# Patient Record
Sex: Female | Born: 1955
Health system: Southern US, Community
[De-identification: ages and names within clinical notes are randomized; demographics above are authoritative.]

## PROBLEM LIST (undated history)

## (undated) DIAGNOSIS — T8859XA Other complications of anesthesia, initial encounter: Secondary | ICD-10-CM

## (undated) DIAGNOSIS — IMO0002 Reserved for concepts with insufficient information to code with codable children: Secondary | ICD-10-CM

## (undated) DIAGNOSIS — G8929 Other chronic pain: Secondary | ICD-10-CM

## (undated) DIAGNOSIS — R9430 Abnormal result of cardiovascular function study, unspecified: Secondary | ICD-10-CM

## (undated) DIAGNOSIS — M1990 Unspecified osteoarthritis, unspecified site: Secondary | ICD-10-CM

## (undated) DIAGNOSIS — J45909 Unspecified asthma, uncomplicated: Secondary | ICD-10-CM

## (undated) DIAGNOSIS — F32A Depression, unspecified: Secondary | ICD-10-CM

## (undated) DIAGNOSIS — M25569 Pain in unspecified knee: Secondary | ICD-10-CM

## (undated) DIAGNOSIS — R000 Tachycardia, unspecified: Secondary | ICD-10-CM

## (undated) DIAGNOSIS — M549 Dorsalgia, unspecified: Secondary | ICD-10-CM

## (undated) DIAGNOSIS — Z9289 Personal history of other medical treatment: Secondary | ICD-10-CM

## (undated) DIAGNOSIS — I10 Essential (primary) hypertension: Secondary | ICD-10-CM

## (undated) DIAGNOSIS — I499 Cardiac arrhythmia, unspecified: Secondary | ICD-10-CM

## (undated) DIAGNOSIS — F329 Major depressive disorder, single episode, unspecified: Secondary | ICD-10-CM

## (undated) HISTORY — PX: REDUCTION MAMMAPLASTY: SUR839

## (undated) HISTORY — PX: ABDOMINAL HYSTERECTOMY: SHX81

## (undated) HISTORY — PX: CHOLECYSTECTOMY: SHX55

## (undated) HISTORY — DX: Reserved for concepts with insufficient information to code with codable children: IMO0002

## (undated) HISTORY — PX: BACK SURGERY: SHX140

## (undated) HISTORY — DX: Abnormal result of cardiovascular function study, unspecified: R94.30

## (undated) HISTORY — DX: Tachycardia, unspecified: R00.0

---

## 2003-08-05 ENCOUNTER — Ambulatory Visit (HOSPITAL_COMMUNITY): Admission: RE | Admit: 2003-08-05 | Discharge: 2003-08-05 | Payer: Self-pay | Admitting: Family Medicine

## 2005-02-05 ENCOUNTER — Ambulatory Visit (HOSPITAL_COMMUNITY): Admission: RE | Admit: 2005-02-05 | Discharge: 2005-02-05 | Payer: Self-pay | Admitting: Family Medicine

## 2005-10-26 ENCOUNTER — Emergency Department (HOSPITAL_COMMUNITY): Admission: EM | Admit: 2005-10-26 | Discharge: 2005-10-26 | Payer: Self-pay | Admitting: Emergency Medicine

## 2006-12-06 ENCOUNTER — Emergency Department (HOSPITAL_COMMUNITY): Admission: EM | Admit: 2006-12-06 | Discharge: 2006-12-06 | Payer: Self-pay | Admitting: Emergency Medicine

## 2006-12-12 ENCOUNTER — Ambulatory Visit: Payer: Self-pay | Admitting: Orthopedic Surgery

## 2006-12-26 ENCOUNTER — Ambulatory Visit: Payer: Self-pay | Admitting: Orthopedic Surgery

## 2007-01-03 ENCOUNTER — Encounter (HOSPITAL_COMMUNITY): Admission: RE | Admit: 2007-01-03 | Discharge: 2007-02-02 | Payer: Self-pay | Admitting: Orthopedic Surgery

## 2007-02-03 ENCOUNTER — Encounter (HOSPITAL_COMMUNITY): Admission: RE | Admit: 2007-02-03 | Discharge: 2007-03-05 | Payer: Self-pay | Admitting: Orthopedic Surgery

## 2007-02-08 ENCOUNTER — Ambulatory Visit: Payer: Self-pay | Admitting: Orthopedic Surgery

## 2007-02-14 DIAGNOSIS — E119 Type 2 diabetes mellitus without complications: Secondary | ICD-10-CM | POA: Insufficient documentation

## 2007-02-14 DIAGNOSIS — Z794 Long term (current) use of insulin: Secondary | ICD-10-CM

## 2007-03-02 ENCOUNTER — Ambulatory Visit: Payer: Self-pay | Admitting: Orthopedic Surgery

## 2007-03-02 DIAGNOSIS — M25519 Pain in unspecified shoulder: Secondary | ICD-10-CM | POA: Insufficient documentation

## 2008-03-22 ENCOUNTER — Ambulatory Visit (HOSPITAL_COMMUNITY): Admission: RE | Admit: 2008-03-22 | Discharge: 2008-03-22 | Payer: Self-pay | Admitting: Family Medicine

## 2008-06-06 ENCOUNTER — Ambulatory Visit (HOSPITAL_COMMUNITY): Admission: RE | Admit: 2008-06-06 | Discharge: 2008-06-06 | Payer: Self-pay | Admitting: Family Medicine

## 2008-07-18 ENCOUNTER — Encounter: Admission: RE | Admit: 2008-07-18 | Discharge: 2008-07-18 | Payer: Self-pay | Admitting: Neurosurgery

## 2009-03-06 ENCOUNTER — Encounter: Admission: RE | Admit: 2009-03-06 | Discharge: 2009-03-06 | Payer: Self-pay | Admitting: Neurosurgery

## 2009-03-14 ENCOUNTER — Ambulatory Visit (HOSPITAL_COMMUNITY): Admission: RE | Admit: 2009-03-14 | Discharge: 2009-03-14 | Payer: Self-pay | Admitting: Internal Medicine

## 2009-03-24 ENCOUNTER — Ambulatory Visit (HOSPITAL_COMMUNITY): Admission: RE | Admit: 2009-03-24 | Discharge: 2009-03-24 | Payer: Self-pay | Admitting: Family Medicine

## 2009-04-14 ENCOUNTER — Inpatient Hospital Stay (HOSPITAL_COMMUNITY): Admission: RE | Admit: 2009-04-14 | Discharge: 2009-04-15 | Payer: Self-pay | Admitting: Neurosurgery

## 2009-07-23 ENCOUNTER — Ambulatory Visit (HOSPITAL_COMMUNITY): Admission: RE | Admit: 2009-07-23 | Discharge: 2009-07-23 | Payer: Self-pay | Admitting: Family Medicine

## 2010-01-26 ENCOUNTER — Observation Stay (HOSPITAL_COMMUNITY): Admission: EM | Admit: 2010-01-26 | Discharge: 2010-01-27 | Payer: Self-pay | Admitting: Emergency Medicine

## 2010-01-26 DIAGNOSIS — Z9289 Personal history of other medical treatment: Secondary | ICD-10-CM

## 2010-01-26 HISTORY — DX: Personal history of other medical treatment: Z92.89

## 2010-05-26 ENCOUNTER — Ambulatory Visit (HOSPITAL_COMMUNITY)
Admission: RE | Admit: 2010-05-26 | Discharge: 2010-05-26 | Payer: Self-pay | Source: Home / Self Care | Admitting: Psychiatry

## 2010-05-28 ENCOUNTER — Other Ambulatory Visit (HOSPITAL_COMMUNITY)
Admission: RE | Admit: 2010-05-28 | Discharge: 2010-06-18 | Payer: Self-pay | Source: Home / Self Care | Attending: Psychiatry | Admitting: Psychiatry

## 2010-06-03 ENCOUNTER — Ambulatory Visit (HOSPITAL_COMMUNITY): Payer: Self-pay | Admitting: Psychiatry

## 2010-07-19 ENCOUNTER — Encounter: Payer: Self-pay | Admitting: Family Medicine

## 2010-07-20 ENCOUNTER — Encounter: Payer: Self-pay | Admitting: Neurosurgery

## 2010-07-28 NOTE — Assessment & Plan Note (Signed)
    Chief Complaint:  shoulder pain.  History of Present Illness: I saw Kelli Summers in the office today for a followup visit.  She is a 55 years old woman with the complaint of:  left shoulder fracture s/p PT        The patient comes in today for the initial evaluation of shoulder pain.  The patient presents with left shoulder pain.  The patient states they are able to  hold comfortably at side, sleep comfortably,  reach up fine       Prior evaluation and treatment has included X-rays:.  Prior effective treatment includes: NSAID's:, shoulder exercises, and PT.     Prior Medications :  * BENICAR  * BUDEPRION  * ACTOPLUS  * LEXAPRO  * POTASSIUM  * FISH OIL  * PRIMROSE     Current Allergies (reviewed today): ! CIPRO ! PCN ! VICODIN  Past Medical History:    Reviewed history from 02/14/2007 and no changes required:       Diabetes       High Blood Pressure   Family History:    Reviewed history and no changes required:  Social History:    Reviewed history and no changes required:   Risk Factors:     Shoulder/Elbow Exam  General:    Well-developed, well-nourished, in no acute distress; alert and oriented x 3.    Skin:    Intact with no erythema; no scarring.    Inspection:    Inspection is normal.    full forward elevation   Palpation:    Non-tender to palpation bilaterally.    Vascular:    normal     Problems:  Medical Problems Added: 1)  Dx of Shoulder Pain  (ICD-719.41)   Impression & Recommendations:  Problem # 1:  SHOULDER PAIN (ICD-719.41) proximal humerus fracture    Patient Instructions: 1)  d/c 2)  return to work on 9/15 3)  Medically cleared to return to work; return to work note provided.

## 2010-09-08 LAB — HEPATIC FUNCTION PANEL
ALT: 34 U/L (ref 0–35)
AST: 30 U/L (ref 0–37)
Albumin: 3.6 g/dL (ref 3.5–5.2)
Alkaline Phosphatase: 127 U/L — ABNORMAL HIGH (ref 39–117)
Bilirubin, Direct: 0.1 mg/dL (ref 0.0–0.3)
Indirect Bilirubin: 0.2 mg/dL — ABNORMAL LOW (ref 0.3–0.9)
Total Bilirubin: 0.3 mg/dL (ref 0.3–1.2)
Total Protein: 7.1 g/dL (ref 6.0–8.3)

## 2010-09-08 LAB — AMYLASE: Amylase: 20 U/L (ref 0–105)

## 2010-09-08 LAB — LIPASE, BLOOD: Lipase: 26 U/L (ref 11–59)

## 2010-09-11 LAB — GLUCOSE, CAPILLARY
Glucose-Capillary: 124 mg/dL — ABNORMAL HIGH (ref 70–99)
Glucose-Capillary: 138 mg/dL — ABNORMAL HIGH (ref 70–99)
Glucose-Capillary: 151 mg/dL — ABNORMAL HIGH (ref 70–99)
Glucose-Capillary: 174 mg/dL — ABNORMAL HIGH (ref 70–99)
Glucose-Capillary: 214 mg/dL — ABNORMAL HIGH (ref 70–99)
Glucose-Capillary: 238 mg/dL — ABNORMAL HIGH (ref 70–99)
Glucose-Capillary: 57 mg/dL — ABNORMAL LOW (ref 70–99)

## 2010-09-11 LAB — COMPREHENSIVE METABOLIC PANEL
ALT: 40 U/L — ABNORMAL HIGH (ref 0–35)
ALT: 73 U/L — ABNORMAL HIGH (ref 0–35)
AST: 43 U/L — ABNORMAL HIGH (ref 0–37)
AST: 58 U/L — ABNORMAL HIGH (ref 0–37)
Albumin: 3.1 g/dL — ABNORMAL LOW (ref 3.5–5.2)
Albumin: 3.4 g/dL — ABNORMAL LOW (ref 3.5–5.2)
Alkaline Phosphatase: 100 U/L (ref 39–117)
Alkaline Phosphatase: 82 U/L (ref 39–117)
BUN: 21 mg/dL (ref 6–23)
BUN: 27 mg/dL — ABNORMAL HIGH (ref 6–23)
CO2: 21 mEq/L (ref 19–32)
CO2: 26 mEq/L (ref 19–32)
Calcium: 8.9 mg/dL (ref 8.4–10.5)
Calcium: 9.1 mg/dL (ref 8.4–10.5)
Chloride: 106 mEq/L (ref 96–112)
Chloride: 107 mEq/L (ref 96–112)
Creatinine, Ser: 1.1 mg/dL (ref 0.4–1.2)
Creatinine, Ser: 1.11 mg/dL (ref 0.4–1.2)
GFR calc Af Amer: >60 mL/min (ref >60)
GFR calc Af Amer: >60 mL/min (ref >60)
GFR calc non Af Amer: 51 mL/min — ABNORMAL LOW (ref >60)
GFR calc non Af Amer: 52 mL/min — ABNORMAL LOW (ref >60)
Glucose, Bld: 123 mg/dL — ABNORMAL HIGH (ref 70–99)
Glucose, Bld: 155 mg/dL — ABNORMAL HIGH (ref 70–99)
Potassium: 3.8 mEq/L (ref 3.5–5.1)
Potassium: 4.8 mEq/L (ref 3.5–5.1)
Sodium: 135 mEq/L (ref 135–145)
Sodium: 137 mEq/L (ref 135–145)
Total Bilirubin: 0.3 mg/dL (ref 0.3–1.2)
Total Bilirubin: 0.5 mg/dL (ref 0.3–1.2)
Total Protein: 6 g/dL (ref 6.0–8.3)
Total Protein: 6.4 g/dL (ref 6.0–8.3)

## 2010-09-11 LAB — CK TOTAL AND CKMB (NOT AT ARMC)
CK, MB: 1.1 ng/mL (ref 0.3–4.0)
CK, MB: 1.2 ng/mL (ref 0.3–4.0)
CK, MB: 1.3 ng/mL (ref 0.3–4.0)
Relative Index: INVALID (ref 0.0–2.5)
Relative Index: INVALID (ref 0.0–2.5)
Relative Index: INVALID (ref 0.0–2.5)
Total CK: 49 U/L (ref 7–177)
Total CK: 55 U/L (ref 7–177)
Total CK: 56 U/L (ref 7–177)

## 2010-09-11 LAB — T3: T3, Total: 96.2 ng/dl (ref 80.0–204.0)

## 2010-09-11 LAB — HEMOGLOBIN A1C
Hgb A1c MFr Bld: 8.6 % — ABNORMAL HIGH (ref <5.7)
Mean Plasma Glucose: 200 mg/dL — ABNORMAL HIGH (ref <117)

## 2010-09-11 LAB — CBC
HCT: 36.9 % (ref 36.0–46.0)
Hemoglobin: 11.8 g/dL — ABNORMAL LOW (ref 12.0–15.0)
MCH: 29.4 pg (ref 26.0–34.0)
MCHC: 32 g/dL (ref 30.0–36.0)
MCV: 91.8 fL (ref 78.0–100.0)
Platelets: 174 10*3/uL (ref 150–400)
RBC: 4.02 MIL/uL (ref 3.87–5.11)
RDW: 13.6 % (ref 11.5–15.5)
WBC: 8 10*3/uL (ref 4.0–10.5)

## 2010-09-11 LAB — HEPATITIS PANEL, ACUTE
HCV Ab: NEGATIVE
Hep A IgM: NEGATIVE
Hep B C IgM: NEGATIVE
Hepatitis B Surface Ag: NEGATIVE

## 2010-09-11 LAB — MRSA PCR SCREENING: MRSA by PCR: NEGATIVE

## 2010-09-11 LAB — LIPID PANEL
Cholesterol: 130 mg/dL (ref 0–200)
HDL: 38 mg/dL — ABNORMAL LOW (ref >39)
LDL Cholesterol: 53 mg/dL (ref 0–99)
Total CHOL/HDL Ratio: 3.4 RATIO
Triglycerides: 194 mg/dL — ABNORMAL HIGH (ref <150)
VLDL: 39 mg/dL (ref 0–40)

## 2010-09-11 LAB — TROPONIN I
Troponin I: 0.01 ng/mL (ref 0.00–0.06)
Troponin I: 0.01 ng/mL (ref 0.00–0.06)
Troponin I: 0.02 ng/mL (ref 0.00–0.06)

## 2010-09-11 LAB — H. PYLORI ANTIBODY, IGG: H Pylori IgG: <0.4 {ISR}

## 2010-09-11 LAB — T4, FREE: Free T4: 1.33 ng/dL (ref 0.80–1.80)

## 2010-09-11 LAB — TSH: TSH: 5.185 u[IU]/mL — ABNORMAL HIGH (ref 0.350–4.500)

## 2010-09-11 LAB — SEDIMENTATION RATE: Sed Rate: 14 mm/hr (ref 0–22)

## 2010-09-12 LAB — CBC
HCT: 38.1 % (ref 36.0–46.0)
Hemoglobin: 12.9 g/dL (ref 12.0–15.0)
MCH: 30.6 pg (ref 26.0–34.0)
MCHC: 33.9 g/dL (ref 30.0–36.0)
MCV: 90.3 fL (ref 78.0–100.0)
Platelets: 174 10*3/uL (ref 150–400)
RBC: 4.22 MIL/uL (ref 3.87–5.11)
RDW: 13.5 % (ref 11.5–15.5)
WBC: 9.2 10*3/uL (ref 4.0–10.5)

## 2010-09-12 LAB — POCT CARDIAC MARKERS
CKMB, poc: 1 ng/mL (ref 1.0–8.0)
CKMB, poc: 1.2 ng/mL (ref 1.0–8.0)
Myoglobin, poc: 78.1 ng/mL (ref 12–200)
Myoglobin, poc: 79.2 ng/mL (ref 12–200)
Troponin i, poc: <0.05 ng/mL (ref 0.00–0.09)
Troponin i, poc: <0.05 ng/mL (ref 0.00–0.09)

## 2010-09-12 LAB — DIFFERENTIAL
Basophils Absolute: 0.1 10*3/uL (ref 0.0–0.1)
Basophils Relative: 1 % (ref 0–1)
Eosinophils Absolute: 0.4 10*3/uL (ref 0.0–0.7)
Eosinophils Relative: 4 % (ref 0–5)
Lymphocytes Relative: 41 % (ref 12–46)
Lymphs Abs: 3.8 10*3/uL (ref 0.7–4.0)
Monocytes Absolute: 0.5 10*3/uL (ref 0.1–1.0)
Monocytes Relative: 6 % (ref 3–12)
Neutro Abs: 4.5 10*3/uL (ref 1.7–7.7)
Neutrophils Relative %: 49 % (ref 43–77)

## 2010-09-12 LAB — POCT I-STAT, CHEM 8
BUN: 31 mg/dL — ABNORMAL HIGH (ref 6–23)
Calcium, Ion: 1.24 mmol/L (ref 1.12–1.32)
Chloride: 108 mEq/L (ref 96–112)
Creatinine, Ser: 1.2 mg/dL (ref 0.4–1.2)
Glucose, Bld: 162 mg/dL — ABNORMAL HIGH (ref 70–99)
HCT: 40 % (ref 36.0–46.0)
Hemoglobin: 13.6 g/dL (ref 12.0–15.0)
Potassium: 4.4 mEq/L (ref 3.5–5.1)
Sodium: 138 mEq/L (ref 135–145)
TCO2: 20 mmol/L (ref 0–100)

## 2010-10-01 LAB — CBC
HCT: 38.8 % (ref 36.0–46.0)
Hemoglobin: 13.2 g/dL (ref 12.0–15.0)
MCHC: 34.1 g/dL (ref 30.0–36.0)
MCV: 90.3 fL (ref 78.0–100.0)
Platelets: 207 10*3/uL (ref 150–400)
RBC: 4.29 MIL/uL (ref 3.87–5.11)
RDW: 13.7 % (ref 11.5–15.5)
WBC: 10.2 10*3/uL (ref 4.0–10.5)

## 2010-10-01 LAB — GLUCOSE, CAPILLARY
Glucose-Capillary: 166 mg/dL — ABNORMAL HIGH (ref 70–99)
Glucose-Capillary: 191 mg/dL — ABNORMAL HIGH (ref 70–99)
Glucose-Capillary: 222 mg/dL — ABNORMAL HIGH (ref 70–99)
Glucose-Capillary: 227 mg/dL — ABNORMAL HIGH (ref 70–99)
Glucose-Capillary: 285 mg/dL — ABNORMAL HIGH (ref 70–99)

## 2010-10-01 LAB — BASIC METABOLIC PANEL
BUN: 8 mg/dL (ref 6–23)
CO2: 27 mEq/L (ref 19–32)
Calcium: 9.1 mg/dL (ref 8.4–10.5)
Chloride: 102 mEq/L (ref 96–112)
Creatinine, Ser: 0.87 mg/dL (ref 0.4–1.2)
GFR calc Af Amer: >60 mL/min (ref >60)
GFR calc non Af Amer: >60 mL/min (ref >60)
Glucose, Bld: 289 mg/dL — ABNORMAL HIGH (ref 70–99)
Potassium: 4.4 mEq/L (ref 3.5–5.1)
Sodium: 138 mEq/L (ref 135–145)

## 2011-06-11 ENCOUNTER — Telehealth: Payer: Self-pay

## 2011-06-11 NOTE — Telephone Encounter (Signed)
Called, line busy. Mailing a letter to call.

## 2011-07-10 ENCOUNTER — Encounter (HOSPITAL_COMMUNITY): Payer: Self-pay | Admitting: *Deleted

## 2011-07-10 ENCOUNTER — Emergency Department (HOSPITAL_COMMUNITY)
Admission: EM | Admit: 2011-07-10 | Discharge: 2011-07-10 | Disposition: A | Discharge status: Home / Self Care | Payer: 59 | Attending: Emergency Medicine | Admitting: Emergency Medicine

## 2011-07-10 DIAGNOSIS — N390 Urinary tract infection, site not specified: Secondary | ICD-10-CM | POA: Insufficient documentation

## 2011-07-10 DIAGNOSIS — M545 Low back pain, unspecified: Secondary | ICD-10-CM | POA: Insufficient documentation

## 2011-07-10 DIAGNOSIS — R739 Hyperglycemia, unspecified: Secondary | ICD-10-CM

## 2011-07-10 DIAGNOSIS — R6883 Chills (without fever): Secondary | ICD-10-CM | POA: Insufficient documentation

## 2011-07-10 DIAGNOSIS — Z79899 Other long term (current) drug therapy: Secondary | ICD-10-CM | POA: Insufficient documentation

## 2011-07-10 DIAGNOSIS — E119 Type 2 diabetes mellitus without complications: Secondary | ICD-10-CM | POA: Insufficient documentation

## 2011-07-10 DIAGNOSIS — R5381 Other malaise: Secondary | ICD-10-CM | POA: Insufficient documentation

## 2011-07-10 LAB — COMPREHENSIVE METABOLIC PANEL
ALT: 23 U/L (ref 0–35)
AST: 13 U/L (ref 0–37)
Albumin: 3.8 g/dL (ref 3.5–5.2)
Alkaline Phosphatase: 158 U/L — ABNORMAL HIGH (ref 39–117)
BUN: 24 mg/dL — ABNORMAL HIGH (ref 6–23)
CO2: 24 mEq/L (ref 19–32)
Calcium: 10.7 mg/dL — ABNORMAL HIGH (ref 8.4–10.5)
Chloride: 100 mEq/L (ref 96–112)
Creatinine, Ser: 0.94 mg/dL (ref 0.50–1.10)
GFR calc Af Amer: 78 mL/min — ABNORMAL LOW (ref >90)
GFR calc non Af Amer: 67 mL/min — ABNORMAL LOW (ref >90)
Glucose, Bld: 482 mg/dL — ABNORMAL HIGH (ref 70–99)
Potassium: 4.6 mEq/L (ref 3.5–5.1)
Sodium: 134 mEq/L — ABNORMAL LOW (ref 135–145)
Total Bilirubin: 0.5 mg/dL (ref 0.3–1.2)
Total Protein: 7.2 g/dL (ref 6.0–8.3)

## 2011-07-10 LAB — CBC
HCT: 39.9 % (ref 36.0–46.0)
Hemoglobin: 13.4 g/dL (ref 12.0–15.0)
MCH: 30.4 pg (ref 26.0–34.0)
MCHC: 33.6 g/dL (ref 30.0–36.0)
MCV: 90.5 fL (ref 78.0–100.0)
Platelets: 184 10*3/uL (ref 150–400)
RBC: 4.41 MIL/uL (ref 3.87–5.11)
RDW: 13.2 % (ref 11.5–15.5)
WBC: 10.8 10*3/uL — ABNORMAL HIGH (ref 4.0–10.5)

## 2011-07-10 LAB — GLUCOSE, CAPILLARY
Glucose-Capillary: 485 mg/dL — ABNORMAL HIGH (ref 70–99)
Glucose-Capillary: 357 mg/dL — ABNORMAL HIGH (ref 70–99)

## 2011-07-10 LAB — URINALYSIS, ROUTINE W REFLEX MICROSCOPIC
Bilirubin Urine: NEGATIVE
Glucose, UA: >1000 mg/dL — AB
Hgb urine dipstick: NEGATIVE
Ketones, ur: NEGATIVE mg/dL
Leukocytes, UA: NEGATIVE
Nitrite: NEGATIVE
Protein, ur: NEGATIVE mg/dL
Specific Gravity, Urine: 1.01 (ref 1.005–1.030)
Urobilinogen, UA: 0.2 mg/dL (ref 0.0–1.0)
pH: 5.5 (ref 5.0–8.0)

## 2011-07-10 LAB — URINE MICROSCOPIC-ADD ON

## 2011-07-10 LAB — DIFFERENTIAL
Basophils Absolute: 0 10*3/uL (ref 0.0–0.1)
Basophils Relative: 0 % (ref 0–1)
Eosinophils Absolute: 0.3 10*3/uL (ref 0.0–0.7)
Eosinophils Relative: 3 % (ref 0–5)
Lymphocytes Relative: 40 % (ref 12–46)
Lymphs Abs: 4.3 10*3/uL — ABNORMAL HIGH (ref 0.7–4.0)
Monocytes Absolute: 0.5 10*3/uL (ref 0.1–1.0)
Monocytes Relative: 5 % (ref 3–12)
Neutro Abs: 5.6 10*3/uL (ref 1.7–7.7)
Neutrophils Relative %: 52 % (ref 43–77)

## 2011-07-10 MED ORDER — SULFAMETHOXAZOLE-TRIMETHOPRIM 800-160 MG PO TABS: 1.0000 | ORAL_TABLET | Freq: Two times a day (BID) | ORAL | Status: AC

## 2011-07-10 MED ORDER — SODIUM CHLORIDE 0.9 % IV SOLN
Freq: Once | INTRAVENOUS | Status: AC
  Administered 2011-07-10: 06:00:00 via INTRAVENOUS

## 2011-07-10 MED ORDER — INSULIN ASPART 100 UNIT/ML ~~LOC~~ SOLN
8.0000 [IU] | Freq: Once | SUBCUTANEOUS | Status: AC
  Administered 2011-07-10: 8 [IU] via INTRAVENOUS
  Filled 2011-07-10: qty 3

## 2011-07-10 MED ORDER — OXYCODONE-ACETAMINOPHEN 5-325 MG PO TABS: 1.0000 | ORAL_TABLET | Freq: Four times a day (QID) | ORAL | Status: AC | PRN

## 2011-07-10 MED ORDER — KETOROLAC TROMETHAMINE 30 MG/ML IJ SOLN
30.0000 mg | Freq: Once | INTRAMUSCULAR | Status: AC
Start: 2011-07-10 — End: 2011-07-10
  Administered 2011-07-10: 30 mg via INTRAVENOUS
  Filled 2011-07-10: qty 1

## 2011-07-10 MED ORDER — MORPHINE SULFATE 4 MG/ML IJ SOLN
4.0000 mg | Freq: Once | INTRAMUSCULAR | Status: AC
  Administered 2011-07-10: 4 mg via INTRAVENOUS
  Filled 2011-07-10: qty 1

## 2011-07-10 NOTE — ED Notes (Signed)
Pt reports pt is IDDM and pt reports she does not know the last time she took her insulin, pt reports cbg over 500

## 2011-07-10 NOTE — ED Provider Notes (Signed)
History     CSN: [id number]  Arrival date & time 07/10/11  0320   First MD Initiated Contact with Patient 07/10/11 0518      Chief Complaint  Patient presents with  . Hyperglycemia    (Consider location/radiation/quality/duration/timing/severity/associated sxs/prior treatment) HPI Comments: Patient reports sugars high "for a while" but does not check often.  Is having pain in her low back that feels like uti's she has had recurrently.  Feels chilled, decreased energy.  Denies abdominal pain.  No nausea, vomiting, or diarrhea.  The history is provided by the patient.    Past Medical History  Diagnosis Date  . Diabetes mellitus   . Renal disorder     Past Surgical History  Procedure Date  . Back surgery   . Cholecystectomy     No family history on file.  History  Substance Use Topics  . Smoking status: Current Everyday Smoker  . Smokeless tobacco: Not on file  . Alcohol Use: Yes    OB History    Grav Para Term Preterm Abortions TAB SAB Ect Mult Living                  Review of Systems  All other systems reviewed and are negative.    Allergies  Ciprofloxacin; Hydrocodone-acetaminophen; and Penicillins  Home Medications   Current Outpatient Rx  Name Route Sig Dispense Refill  . BENICAR PO Oral Take by mouth.    . EFFEXOR PO Oral Take by mouth.      BP 125/94  Pulse 104  Temp(Src) 98.3 F (36.8 C) (Oral)  Resp 20  SpO2 95%  Physical Exam  Nursing note and vitals reviewed. Constitutional: She is oriented to person, place, and time. She appears well-developed and well-nourished. No distress.  HENT:  Head: Normocephalic and atraumatic.  Neck: Normal range of motion. Neck supple.  Cardiovascular: Normal rate and regular rhythm.  Exam reveals no gallop and no friction rub.   No murmur heard. Pulmonary/Chest: Effort normal and breath sounds normal. No respiratory distress. She has no wheezes.  Abdominal: Soft. Bowel sounds are normal. She exhibits  no distension. There is no tenderness.  Musculoskeletal: Normal range of motion.  Neurological: She is alert and oriented to person, place, and time.  Skin: Skin is warm and dry. She is not diaphoretic.    ED Course  Procedures (including critical care time)  Labs Reviewed  GLUCOSE, CAPILLARY - Abnormal; Notable for the following:    Glucose-Capillary 485 (*)    All other components within normal limits  URINALYSIS, ROUTINE W REFLEX MICROSCOPIC - Abnormal; Notable for the following:    Glucose, UA >1000 (*)    All other components within normal limits  URINE MICROSCOPIC-ADD ON - Abnormal; Notable for the following:    Squamous Epithelial / LPF FEW (*)    Bacteria, UA MANY (*)    All other components within normal limits  POCT CBG MONITORING   No results found.   No diagnosis found.    MDM  Labs look okay.  No evidence of DKA and sugars are improving after the insulin.  She does have a uti and will be treated with antibiotics.  She needs to follow her sugars closely at home and follow up with her doctor for a recheck.        Geoffery Lyons, MD 07/10/11 [phone]

## 2011-09-06 ENCOUNTER — Emergency Department (HOSPITAL_COMMUNITY)
Admission: EM | Admit: 2011-09-06 | Discharge: 2011-09-06 | Disposition: A | Discharge status: Home / Self Care | Payer: 59 | Attending: Emergency Medicine | Admitting: Emergency Medicine

## 2011-09-06 ENCOUNTER — Encounter (HOSPITAL_COMMUNITY): Payer: Self-pay | Admitting: *Deleted

## 2011-09-06 DIAGNOSIS — E119 Type 2 diabetes mellitus without complications: Secondary | ICD-10-CM | POA: Insufficient documentation

## 2011-09-06 DIAGNOSIS — M7600 Gluteal tendinitis, unspecified hip: Secondary | ICD-10-CM

## 2011-09-06 DIAGNOSIS — M6580 Other synovitis and tenosynovitis, unspecified site: Secondary | ICD-10-CM | POA: Insufficient documentation

## 2011-09-06 DIAGNOSIS — F17200 Nicotine dependence, unspecified, uncomplicated: Secondary | ICD-10-CM | POA: Insufficient documentation

## 2011-09-06 LAB — URINALYSIS, ROUTINE W REFLEX MICROSCOPIC
Bilirubin Urine: NEGATIVE
Glucose, UA: >1000 mg/dL — AB
Hgb urine dipstick: NEGATIVE
Ketones, ur: NEGATIVE mg/dL
Leukocytes, UA: NEGATIVE
Nitrite: POSITIVE — AB
Protein, ur: NEGATIVE mg/dL
Specific Gravity, Urine: 1.01 (ref 1.005–1.030)
Urobilinogen, UA: 0.2 mg/dL (ref 0.0–1.0)
pH: 5.5 (ref 5.0–8.0)

## 2011-09-06 LAB — URINE MICROSCOPIC-ADD ON

## 2011-09-06 LAB — GLUCOSE, CAPILLARY: Glucose-Capillary: 387 mg/dL — ABNORMAL HIGH (ref 70–99)

## 2011-09-06 MED ORDER — HYDROMORPHONE HCL PF 1 MG/ML IJ SOLN
2.0000 mg | Freq: Once | INTRAMUSCULAR | Status: AC
  Administered 2011-09-06: 2 mg via INTRAVENOUS
  Filled 2011-09-06 (×2): qty 1

## 2011-09-06 MED ORDER — IBUPROFEN 800 MG PO TABS: 800.0000 mg | ORAL_TABLET | Freq: Three times a day (TID) | ORAL | Status: AC

## 2011-09-06 MED ORDER — SODIUM CHLORIDE 0.9 % IV SOLN: Freq: Once | INTRAVENOUS | Status: DC

## 2011-09-06 MED ORDER — OXYCODONE-ACETAMINOPHEN 5-325 MG PO TABS: 1.0000 | ORAL_TABLET | ORAL | Status: AC | PRN

## 2011-09-06 MED ORDER — KETOROLAC TROMETHAMINE 30 MG/ML IJ SOLN
30.0000 mg | Freq: Once | INTRAMUSCULAR | Status: AC
  Administered 2011-09-06: 30 mg via INTRAVENOUS
  Filled 2011-09-06: qty 1

## 2011-09-06 MED ORDER — SODIUM CHLORIDE 0.9 % IV BOLUS (SEPSIS)
500.0000 mL | Freq: Once | INTRAVENOUS | Status: AC
  Administered 2011-09-06: 500 mL via INTRAVENOUS

## 2011-09-06 NOTE — ED Notes (Signed)
Pt thinks she has a "kidney infection"  Pain for weeks.  Thinks she may have an infection or kidney stone.  No injury.  Nausea, no vomiting,

## 2011-09-06 NOTE — Discharge Instructions (Signed)
Apply heat to the area for comfort. Use the medicines as directed. Follow up with your doctor.   Tendinitis Tendinitis is swelling and inflammation of the tendons. Tendons are band-like tissues that connect muscle to bone. Tendinitis commonly occurs in the:   Shoulders (rotator cuff).   Heels (Achilles tendon).   Elbows (triceps tendon).  CAUSES Tendinitis is usually caused by overusing the tendon, muscles, and joints involved. When the tissue surrounding a tendon (synovium) becomes inflamed, it is called tenosynovitis. Tendinitis commonly develops in people whose jobs require repetitive motions. SYMPTOMS  Pain.   Tenderness.   Mild swelling.  DIAGNOSIS Tendinitis is usually diagnosed by physical exam. Your caregiver may also order X-rays or other imaging tests. TREATMENT Your caregiver may recommend certain medicines or exercises for your treatment. HOME CARE INSTRUCTIONS   Use a sling or splint for as long as directed by your caregiver until the pain decreases.   Put ice on the injured area.   Put ice in a plastic bag.   Place a towel between your skin and the bag.   Leave the ice on for 15 to 20 minutes, 3 to 4 times a day.   Avoid using the limb while the tendon is painful. Perform gentle range of motion exercises only as directed by your caregiver. Stop exercises if pain or discomfort increase, unless directed otherwise by your caregiver.   Only take over-the-counter or prescription medicines for pain, discomfort, or fever as directed by your caregiver.  SEEK MEDICAL CARE IF:   Your pain and swelling increase.   You develop new, unexplained symptoms, especially increased numbness in the hands.  MAKE SURE YOU:   Understand these instructions.   Will watch your condition.   Will get help right away if you are not doing well or get worse.  Document Released: 06/11/2000 Document Revised: 06/03/2011 Document Reviewed: 08/31/2010 [facility] Patient Information 2012  [place], [place].

## 2011-09-06 NOTE — ED Provider Notes (Signed)
History     CSN: [id number]  Arrival date & time 09/06/11  1349   First MD Initiated Contact with Patient 09/06/11 1732      Chief Complaint  Patient presents with  . Back Pain    (Consider location/radiation/quality/duration/timing/severity/associated sxs/prior treatment) HPI Kelli Summers is a 56 y.o. female who presents to the Emergency Department complaining of low back pain on both sides for several weeks. She has had urinary tract infections that have been treated with multiple antibiotics and continue to recur. This pain is not flank pain but lower. Described as hot burring sensation across the lower back. Also with pain that radiates down the sides of both legs.Works on her feel all day lifting, walking up and down stairs, bending and twisting. No recent injury. Denies weakness, numbness, tingling.  PCP  Dr. Phillips Odor    Past Medical History  Diagnosis Date  . Diabetes mellitus   . Renal disorder     Past Surgical History  Procedure Date  . Back surgery   . Cholecystectomy   . Abdominal hysterectomy     History reviewed. No pertinent family history.  History  Substance Use Topics  . Smoking status: Current Everyday Smoker  . Smokeless tobacco: Not on file  . Alcohol Use: Yes    OB History    Grav Para Term Preterm Abortions TAB SAB Ect Mult Living                  Review of Systems A 10 review of systems reviewed and are negative for acute change except as noted in the HPI. Allergies  Ciprofloxacin; Hydrocodone-acetaminophen; and Penicillins  Home Medications   Current Outpatient Rx  Name Route Sig Dispense Refill  . BENICAR PO Oral Take by mouth.    . EFFEXOR PO Oral Take by mouth.      BP 157/96  Pulse 93  Temp(Src) 97.7 F (36.5 C) (Oral)  Resp 16  Ht 5\' 5"  (1.651 m)  Wt 194 lb (87.998 kg)  BMI 32.28 kg/m2  SpO2 98%  Physical Exam Physical examination:  Nursing notes reviewed; Vital signs and O2 SAT reviewed;  Constitutional: Well  developed, Well nourished, Well hydrated, In no acute distress; Head:  Normocephalic, atraumatic; Eyes: EOMI, PERRL, No scleral icterus; ENMT: Mouth and pharynx normal, Mucous membranes moist; Neck: Supple, Full range of motion, No lymphadenopathy; Cardiovascular: Regular rate and rhythm, No murmur, rub, or gallop; Respiratory: Breath sounds clear & equal bilaterally, No rales, rhonchi, wheezes, or rub, Normal respiratory effort/excursion; Chest: Nontender, Movement normal; Abdomen: Soft, Nontender, Nondistended, Normal bowel sounds; Genitourinary: No CVA tenderness; Back: discomfort with palpation across the top of the iliac crest right greater than left.  Extremities:  Tenderness to palpation along the gluteus tensor faciae latae, Pulses normal, No tenderness, No edema, No calf edema or asymmetry.; Neuro: AA&Ox3, Major CN grossly intact.  No gross focal motor or sensory deficits in extremities.; Skin: Color normal, Warm, Dry  ED Course  Procedures (including critical care time)  Labs Reviewed  URINALYSIS, ROUTINE W REFLEX MICROSCOPIC - Abnormal; Notable for the following:    Glucose, UA >1000 (*)    Nitrite POSITIVE (*)    All other components within normal limits  URINE MICROSCOPIC-ADD ON - Abnormal; Notable for the following:    Bacteria, UA MANY (*)    All other components within normal limits  GLUCOSE, CAPILLARY - Abnormal; Notable for the following:    Glucose-Capillary 387 (*)    All other components  within normal limits      MDM  Patient with lowe back pain c/w a non specific generalized tendinitis. Given IVF, analgesic, antiinflammatory with some improvement.Urine with glucose. CBG 387.  Pt feels improved after observation and/or treatment in ED.Pt stable in ED with no significant deterioration in condition.The patient appears reasonably screened and/or stabilized for discharge and I doubt any other medical condition or other [facility] requiring further screening, evaluation, or treatment in  the ED at this time prior to discharge.  MDM Reviewed: nursing note and vitals Interpretation: labs           Nicoletta Dress. Colon Branch, MD 09/06/11 [phone]

## 2011-09-10 ENCOUNTER — Other Ambulatory Visit (HOSPITAL_COMMUNITY): Payer: Self-pay | Admitting: Family Medicine

## 2011-09-10 DIAGNOSIS — Z139 Encounter for screening, unspecified: Secondary | ICD-10-CM

## 2011-09-13 ENCOUNTER — Ambulatory Visit (HOSPITAL_COMMUNITY)
Admission: RE | Admit: 2011-09-13 | Discharge: 2011-09-13 | Disposition: A | Discharge status: Home / Self Care | Payer: 59 | Source: Ambulatory Visit | Attending: Family Medicine | Admitting: Family Medicine

## 2011-09-13 DIAGNOSIS — Z1231 Encounter for screening mammogram for malignant neoplasm of breast: Secondary | ICD-10-CM | POA: Insufficient documentation

## 2011-09-13 DIAGNOSIS — Z139 Encounter for screening, unspecified: Secondary | ICD-10-CM

## 2012-12-30 ENCOUNTER — Emergency Department (HOSPITAL_COMMUNITY)
Admission: EM | Admit: 2012-12-30 | Discharge: 2012-12-30 | Disposition: A | Discharge status: Home / Self Care | Payer: 59 | Attending: Emergency Medicine | Admitting: Emergency Medicine

## 2012-12-30 ENCOUNTER — Encounter (HOSPITAL_COMMUNITY): Payer: Self-pay

## 2012-12-30 DIAGNOSIS — Z87448 Personal history of other diseases of urinary system: Secondary | ICD-10-CM | POA: Insufficient documentation

## 2012-12-30 DIAGNOSIS — L03818 Cellulitis of other sites: Secondary | ICD-10-CM | POA: Insufficient documentation

## 2012-12-30 DIAGNOSIS — Z794 Long term (current) use of insulin: Secondary | ICD-10-CM | POA: Insufficient documentation

## 2012-12-30 DIAGNOSIS — F17200 Nicotine dependence, unspecified, uncomplicated: Secondary | ICD-10-CM | POA: Insufficient documentation

## 2012-12-30 DIAGNOSIS — E119 Type 2 diabetes mellitus without complications: Secondary | ICD-10-CM | POA: Insufficient documentation

## 2012-12-30 DIAGNOSIS — L02818 Cutaneous abscess of other sites: Secondary | ICD-10-CM | POA: Insufficient documentation

## 2012-12-30 DIAGNOSIS — L0291 Cutaneous abscess, unspecified: Secondary | ICD-10-CM

## 2012-12-30 DIAGNOSIS — Z79899 Other long term (current) drug therapy: Secondary | ICD-10-CM | POA: Insufficient documentation

## 2012-12-30 MED ORDER — DOXYCYCLINE HYCLATE 100 MG PO CAPS: 100.0000 mg | ORAL_CAPSULE | Freq: Two times a day (BID) | ORAL | Status: DC

## 2012-12-30 NOTE — ED Notes (Signed)
Complain of pain and abscess to top of head. Also, earache

## 2012-12-30 NOTE — ED Provider Notes (Signed)
History    CSN: [id number] Arrival date & time 12/30/12  1352  First MD Initiated Contact with Patient 12/30/12 1429     Chief Complaint  Patient presents with  . Abscess   (Consider location/radiation/quality/duration/timing/severity/associated sxs/prior Treatment) Patient is a 57 y.o. female presenting with abscess. The history is provided by the patient (pt complains of abscess to top of head). No language interpreter was used.  Abscess Abscess location: head. Abscess quality: painful   Red streaking: no   Progression:  Unchanged Pain details:    Quality:  Dull   Severity:  Moderate Associated symptoms: no fatigue and no headaches    Past Medical History  Diagnosis Date  . Diabetes mellitus   . Renal disorder    Past Surgical History  Procedure Laterality Date  . Back surgery    . Cholecystectomy    . Abdominal hysterectomy     No family history on file. History  Substance Use Topics  . Smoking status: Current Every Day Smoker  . Smokeless tobacco: Not on file  . Alcohol Use: Yes   OB History   Grav Para Term Preterm Abortions TAB SAB Ect Mult Living                 Review of Systems  Constitutional: Negative for appetite change and fatigue.  HENT: Negative for congestion, sinus pressure and ear discharge.        Tender swollen abscess to top of head  Eyes: Negative for discharge.  Respiratory: Negative for cough.   Cardiovascular: Negative for chest pain.  Gastrointestinal: Negative for abdominal pain and diarrhea.  Genitourinary: Negative for frequency and hematuria.  Musculoskeletal: Negative for back pain.  Skin: Negative for rash.  Neurological: Negative for seizures and headaches.  Psychiatric/Behavioral: Negative for hallucinations.    Allergies  Ciprofloxacin; Hydrocodone-acetaminophen; and Penicillins  Home Medications   Current Outpatient Rx  Name  Route  Sig  Dispense  Refill  . diazepam (VALIUM) 10 MG tablet   Oral   Take 10 mg by  mouth 2 (two) times daily.         . DULoxetine (CYMBALTA) 60 MG capsule   Oral   Take 60 mg by mouth daily.         . insulin lispro (HUMALOG) 100 UNIT/ML injection   Subcutaneous   Inject 20 Units into the skin 3 (three) times daily before meals.         . meloxicam (MOBIC) 15 MG tablet   Oral   Take 15 mg by mouth daily.         Marland Kitchen doxycycline (VIBRAMYCIN) 100 MG capsule   Oral   Take 1 capsule (100 mg total) by mouth 2 (two) times daily. One po bid x 7 days   20 capsule   0    BP 143/93  Pulse 121  Temp(Src) 99 F (37.2 C) (Oral)  Resp 23  Ht 5\' 5"  (1.651 m)  Wt 180 lb (81.647 kg)  BMI 29.95 kg/m2  SpO2 98% Physical Exam  Constitutional: She is oriented to person, place, and time. She appears well-developed.  HENT:  Head: Normocephalic.  Eyes: Conjunctivae and EOM are normal. No scleral icterus.  Neck: Neck supple. No thyromegaly present.  Cardiovascular: Normal rate and regular rhythm.  Exam reveals no gallop and no friction rub.   No murmur heard. Pulmonary/Chest: No stridor. She has no wheezes. She has no rales. She exhibits no tenderness.  Abdominal: She exhibits no distension. There  is no tenderness. There is no rebound.  Musculoskeletal: Normal range of motion. She exhibits no edema.  Lymphadenopathy:    She has no cervical adenopathy.  Neurological: She is oriented to person, place, and time. Coordination normal.  Skin: No rash noted. There is erythema.  Small draining abscess to top of head  Psychiatric: She has a normal mood and affect. Her behavior is normal.    ED Course  Procedures (including critical care time) Labs Reviewed - No data to display No results found. 1. Abscess     MDM    Benny Lennert, MD 12/30/12 [phone]

## 2012-12-30 NOTE — ED Notes (Signed)
Pt with sore to left posterior scalp, noticed since Tuesday per pt., pt admits to picking at scab yesterday and since has had drainage

## 2012-12-30 NOTE — ED Notes (Signed)
MD at bedside. 

## 2013-04-29 ENCOUNTER — Emergency Department (HOSPITAL_COMMUNITY): Payer: 59

## 2013-04-29 ENCOUNTER — Inpatient Hospital Stay (HOSPITAL_COMMUNITY)
Admission: EM | Admit: 2013-04-29 | Discharge: 2013-05-02 | DRG: 638 | Disposition: A | Discharge status: Home / Self Care | Payer: 59 | Attending: Internal Medicine | Admitting: Internal Medicine

## 2013-04-29 ENCOUNTER — Encounter (HOSPITAL_COMMUNITY): Payer: Self-pay | Admitting: Emergency Medicine

## 2013-04-29 DIAGNOSIS — E1310 Other specified diabetes mellitus with ketoacidosis without coma: Principal | ICD-10-CM | POA: Diagnosis present

## 2013-04-29 DIAGNOSIS — IMO0001 Reserved for inherently not codable concepts without codable children: Secondary | ICD-10-CM

## 2013-04-29 DIAGNOSIS — R52 Pain, unspecified: Secondary | ICD-10-CM | POA: Diagnosis present

## 2013-04-29 DIAGNOSIS — Z9089 Acquired absence of other organs: Secondary | ICD-10-CM

## 2013-04-29 DIAGNOSIS — F3289 Other specified depressive episodes: Secondary | ICD-10-CM | POA: Diagnosis present

## 2013-04-29 DIAGNOSIS — G8929 Other chronic pain: Secondary | ICD-10-CM | POA: Diagnosis present

## 2013-04-29 DIAGNOSIS — I10 Essential (primary) hypertension: Secondary | ICD-10-CM | POA: Diagnosis present

## 2013-04-29 DIAGNOSIS — F329 Major depressive disorder, single episode, unspecified: Secondary | ICD-10-CM | POA: Diagnosis present

## 2013-04-29 DIAGNOSIS — E119 Type 2 diabetes mellitus without complications: Secondary | ICD-10-CM

## 2013-04-29 DIAGNOSIS — Z9119 Patient's noncompliance with other medical treatment and regimen: Secondary | ICD-10-CM

## 2013-04-29 DIAGNOSIS — B9789 Other viral agents as the cause of diseases classified elsewhere: Secondary | ICD-10-CM | POA: Diagnosis present

## 2013-04-29 DIAGNOSIS — Z794 Long term (current) use of insulin: Secondary | ICD-10-CM

## 2013-04-29 DIAGNOSIS — E1110 Type 2 diabetes mellitus with ketoacidosis without coma: Secondary | ICD-10-CM

## 2013-04-29 DIAGNOSIS — F17200 Nicotine dependence, unspecified, uncomplicated: Secondary | ICD-10-CM | POA: Diagnosis present

## 2013-04-29 DIAGNOSIS — I959 Hypotension, unspecified: Secondary | ICD-10-CM

## 2013-04-29 DIAGNOSIS — Z91199 Patient's noncompliance with other medical treatment and regimen due to unspecified reason: Secondary | ICD-10-CM

## 2013-04-29 DIAGNOSIS — E1165 Type 2 diabetes mellitus with hyperglycemia: Secondary | ICD-10-CM | POA: Diagnosis present

## 2013-04-29 DIAGNOSIS — R739 Hyperglycemia, unspecified: Secondary | ICD-10-CM

## 2013-04-29 DIAGNOSIS — IMO0002 Reserved for concepts with insufficient information to code with codable children: Secondary | ICD-10-CM | POA: Diagnosis present

## 2013-04-29 DIAGNOSIS — E871 Hypo-osmolality and hyponatremia: Secondary | ICD-10-CM | POA: Diagnosis present

## 2013-04-29 HISTORY — DX: Dorsalgia, unspecified: M54.9

## 2013-04-29 HISTORY — DX: Pain in unspecified knee: M25.569

## 2013-04-29 HISTORY — DX: Personal history of other medical treatment: Z92.89

## 2013-04-29 HISTORY — DX: Essential (primary) hypertension: I10

## 2013-04-29 HISTORY — DX: Other chronic pain: G89.29

## 2013-04-29 HISTORY — DX: Major depressive disorder, single episode, unspecified: F32.9

## 2013-04-29 HISTORY — DX: Depression, unspecified: F32.A

## 2013-04-29 LAB — CBC WITH DIFFERENTIAL/PLATELET
Basophils Absolute: 0 10*3/uL (ref 0.0–0.1)
Basophils Relative: 0 % (ref 0–1)
Eosinophils Absolute: 0 10*3/uL (ref 0.0–0.7)
Eosinophils Relative: 0 % (ref 0–5)
HCT: 38.4 % (ref 36.0–46.0)
Hemoglobin: 13.1 g/dL (ref 12.0–15.0)
Lymphocytes Relative: 10 % — ABNORMAL LOW (ref 12–46)
Lymphs Abs: 1.3 10*3/uL (ref 0.7–4.0)
MCH: 30.8 pg (ref 26.0–34.0)
MCHC: 34.1 g/dL (ref 30.0–36.0)
MCV: 90.4 fL (ref 78.0–100.0)
Monocytes Absolute: 0.7 10*3/uL (ref 0.1–1.0)
Monocytes Relative: 6 % (ref 3–12)
Neutro Abs: 10.1 10*3/uL — ABNORMAL HIGH (ref 1.7–7.7)
Neutrophils Relative %: 84 % — ABNORMAL HIGH (ref 43–77)
Platelets: 157 10*3/uL (ref 150–400)
RBC: 4.25 MIL/uL (ref 3.87–5.11)
RDW: 13.1 % (ref 11.5–15.5)
WBC: 12.1 10*3/uL — ABNORMAL HIGH (ref 4.0–10.5)

## 2013-04-29 LAB — GLUCOSE, CAPILLARY
Glucose-Capillary: 287 mg/dL — ABNORMAL HIGH (ref 70–99)
Glucose-Capillary: 344 mg/dL — ABNORMAL HIGH (ref 70–99)
Glucose-Capillary: 396 mg/dL — ABNORMAL HIGH (ref 70–99)
Glucose-Capillary: 398 mg/dL — ABNORMAL HIGH (ref 70–99)
Glucose-Capillary: 505 mg/dL — ABNORMAL HIGH (ref 70–99)

## 2013-04-29 LAB — URINALYSIS W MICROSCOPIC + REFLEX CULTURE: Specific Gravity, Urine: <1.005 — ABNORMAL LOW (ref 1.005–1.030)

## 2013-04-29 LAB — BLOOD GAS, ARTERIAL
Acid-base deficit: 8.2 mmol/L — ABNORMAL HIGH (ref 0.0–2.0)
Bicarbonate: 16.2 mEq/L — ABNORMAL LOW (ref 20.0–24.0)
Drawn by: 23534
FIO2: 0.21 %
O2 Content: 21 L/min
O2 Saturation: 94.4 %
Patient temperature: 37
TCO2: 14.8 mmol/L (ref 0–100)
pCO2 arterial: 29.6 mmHg — ABNORMAL LOW (ref 35.0–45.0)
pH, Arterial: 7.358 (ref 7.350–7.450)
pO2, Arterial: 70.1 mmHg — ABNORMAL LOW (ref 80.0–100.0)

## 2013-04-29 LAB — COMPREHENSIVE METABOLIC PANEL
ALT: 24 U/L (ref 0–35)
AST: 20 U/L (ref 0–37)
Albumin: 2.9 g/dL — ABNORMAL LOW (ref 3.5–5.2)
Alkaline Phosphatase: 141 U/L — ABNORMAL HIGH (ref 39–117)
BUN: 11 mg/dL (ref 6–23)
CO2: 20 mEq/L (ref 19–32)
Calcium: 9.3 mg/dL (ref 8.4–10.5)
Chloride: 92 mEq/L — ABNORMAL LOW (ref 96–112)
Creatinine, Ser: 0.96 mg/dL (ref 0.50–1.10)
GFR calc Af Amer: 75 mL/min — ABNORMAL LOW (ref >90)
GFR calc non Af Amer: 64 mL/min — ABNORMAL LOW (ref >90)
Glucose, Bld: 671 mg/dL (ref 70–99)
Potassium: 3.9 mEq/L (ref 3.5–5.1)
Sodium: 126 mEq/L — ABNORMAL LOW (ref 135–145)
Total Bilirubin: 0.6 mg/dL (ref 0.3–1.2)
Total Protein: 6.8 g/dL (ref 6.0–8.3)

## 2013-04-29 LAB — LACTIC ACID, PLASMA: Lactic Acid, Venous: 1.4 mmol/L (ref 0.5–2.2)

## 2013-04-29 LAB — LIPASE, BLOOD: Lipase: 17 U/L (ref 11–59)

## 2013-04-29 MED ORDER — SODIUM CHLORIDE 0.9 % IV BOLUS (SEPSIS)
1000.0000 mL | Freq: Once | INTRAVENOUS | Status: AC
  Administered 2013-04-29: 1000 mL via INTRAVENOUS

## 2013-04-29 MED ORDER — SODIUM CHLORIDE 0.9 % IV SOLN
INTRAVENOUS | Status: DC
  Administered 2013-04-29: 17:00:00 via INTRAVENOUS

## 2013-04-29 MED ORDER — ACETAMINOPHEN 500 MG PO TABS
1000.0000 mg | ORAL_TABLET | Freq: Once | ORAL | Status: DC
  Filled 2013-04-29: qty 2

## 2013-04-29 MED ORDER — DEXTROSE 50 % IV SOLN: 25.0000 mL | INTRAVENOUS | Status: DC | PRN

## 2013-04-29 MED ORDER — IOHEXOL 350 MG/ML SOLN
100.0000 mL | Freq: Once | INTRAVENOUS | Status: AC | PRN
  Administered 2013-04-29: 100 mL via INTRAVENOUS

## 2013-04-29 MED ORDER — INSULIN REGULAR BOLUS VIA INFUSION
0.0000 [IU] | Freq: Three times a day (TID) | INTRAVENOUS | Status: DC
  Filled 2013-04-29: qty 10

## 2013-04-29 MED ORDER — ONDANSETRON HCL 4 MG/2ML IJ SOLN
4.0000 mg | INTRAMUSCULAR | Status: DC | PRN
  Administered 2013-04-29: 4 mg via INTRAVENOUS
  Filled 2013-04-29: qty 2

## 2013-04-29 MED ORDER — MORPHINE SULFATE 4 MG/ML IJ SOLN
4.0000 mg | INTRAMUSCULAR | Status: AC | PRN
  Administered 2013-04-29 (×2): 4 mg via INTRAVENOUS
  Filled 2013-04-29 (×2): qty 1

## 2013-04-29 MED ORDER — SODIUM CHLORIDE 0.9 % IV SOLN
INTRAVENOUS | Status: DC
  Administered 2013-04-29: 21:00:00 via INTRAVENOUS

## 2013-04-29 MED ORDER — DEXTROSE-NACL 5-0.45 % IV SOLN
INTRAVENOUS | Status: DC
  Administered 2013-04-30: 01:00:00 via INTRAVENOUS

## 2013-04-29 MED ORDER — SODIUM CHLORIDE 0.9 % IV SOLN
INTRAVENOUS | Status: DC
  Administered 2013-04-29: 4.5 [IU]/h via INTRAVENOUS
  Filled 2013-04-29: qty 1

## 2013-04-29 NOTE — H&P (Signed)
[facility] History and Physical  Kelli Summers  ZOX:[id number]  DOB: 1955-11-10   DOA: 04/29/2013   PCP:   Colette Ribas, MD   Dr Talmage Nap for endocrinology Dr Evelene Croon for Psych q 6-8 months  Chief Complaint:  Intermittent Generalized body aches for one week   HPI: Kelli Summers is a 57 y.o. female.   Diabetic lady who recently returned from vacation in [place], [place]. Reports that she arrived in [place] on the 23rd, and then on the 24th or 25th noticed that she was getting very cold nights. This seems to subside but then a couple days later she started having chills and low energy. Her blood sugars during her vacation range between 150 and 200 when tested.   She returned to the [place] on the 29th and she felt fine when she got home, but the next day she started feeling bad again having low energy and then now pain all over all her joints hurt.  As a result of the above she came to the emergency room to be evaluated expressing concern that she may have contracted chikungunya virus in [place]. After this was explained to her that the timeline is unlikely for such an infection she reports that then began to complain of chest pain and eventually the hospitalist service was called to admit her because of uncontrolled blood sugars  She reports that this morning her fasting blood sugar was 164, but she felt somewhat bloated and only get half of her usual meal, and therefore only took half her usual dose of insulin.  Her insulin is managed with Humalog 3 times a day by sliding scale  She takes no long-acting insulin   Rewiew of Systems:   All systems negative except as marked bold or noted in the HPI;  Constitutional:    malaise, fever and chills. ;  Eyes:   eye pain, redness and discharge. ; burning of eyes ENMT:   ear pain, hoarseness, nasal congestion, sinus pressure and sore throat. ;  Cardiovascular:    chest pain, palpitations, diaphoresis, dyspnea and peripheral edema.   Respiratory:   cough, hemoptysis, wheezing and stridor. ;  Gastrointestinal:  nausea, vomiting, diarrhea, constipation, abdominal pain, melena, blood in stool, hematemesis, jaundice and rectal bleeding. unusual weight loss..   Genitourinary:    frequency, dysuria, incontinence,flank pain and hematuria; Musculoskeletal:   back pain and neck pain.  swelling and trauma.;  Skin: .  pruritus, rash, abrasions, bruising and skin lesion.; ulcerations Neuro:    headache, lightheadedness and neck stiffness.  weakness, altered level of consciousness, altered mental status, extremity weakness, burning feet, involuntary movement, seizure and syncope.  Psych:    anxiety, depression, insomnia, tearfulness, panic attacks, hallucinations, paranoia, suicidal or homicidal ideation    Past Medical History  Diagnosis Date  . Diabetes mellitus   . History of stress test 01/2010    normal myoview stress test  . Chronic back pain   . Depression   . Chronic knee pain   . Hypertension     Past Surgical History  Procedure Laterality Date  . Back surgery    . Cholecystectomy    . Abdominal hysterectomy      Medications:  HOME MEDS: Prior to Admission medications   Medication Sig Start Date End Date Taking? Authorizing Provider  diazepam (VALIUM) 10 MG tablet Take 10 mg by mouth daily as needed for anxiety.    Yes Historical Provider, MD  DULoxetine (CYMBALTA) 60 MG capsule Take 60 mg  by mouth daily.   Yes Historical Provider, MD  insulin lispro (HUMALOG) 100 UNIT/ML injection Inject 20 Units into the skin 3 (three) times daily before meals.   Yes Historical Provider, MD  meloxicam (MOBIC) 15 MG tablet Take 15 mg by mouth daily as needed for pain.    Yes Historical Provider, MD     Allergies:  Allergies  Allergen Reactions  . Ciprofloxacin Itching  . Hydrocodone-Acetaminophen Itching  . Penicillins Itching    Social History:   reports that she has been smoking.  She does not have any smokeless  tobacco history on file. She reports that she drinks alcohol. She reports that she does not use illicit drugs.  Family History: No family history on file.   Physical Exam: Filed Vitals:   04/29/13 1301 04/29/13 1907 04/29/13 2158  BP: 132/88 132/73 136/80  Pulse: 122 107 122  Temp: 100.8 F (38.2 C) 98.1 F (36.7 C) 99.2 F (37.3 C)  TempSrc: Oral Oral Oral  Resp: 18 24 20   Height: 5\' 5"  (1.651 m)    Weight: 80.74 kg (178 lb)    SpO2: 99% 100% 93%   Blood pressure 136/80, pulse 122, temperature 99.2 F (37.3 C), temperature source Oral, resp. rate 20, height 5\' 5"  (1.651 m), weight 80.74 kg (178 lb), SpO2 93.00%. Body mass index is 29.62 kg/(m^2).   GEN:  Pleasant middle-aged lady lying bed in no acute distress; she is not ill-appearing cooperative with exam PSYCH:  alert and oriented x4; somewhat anxious; affect is appropriate. HEENT: Mucous membranes pink and anicteric; PERRLA; EOM intact; no cervical lymphadenopathy nor thyromegaly or carotid bruit; no JVD; Breasts:: Not examined CHEST WALL: Tender to palpation CHEST: Normal respiration, clear to auscultation bilaterally HEART: Tachycardia Regular rate and rhythm; no murmurs rubs or gallops ABDOMEN: Obese, left anterior flank tenderness no masses, no organomegaly, normal abdominal bowel sounds; no pannus; no intertriginous candida. Rectal Exam: Not done EXTREMITIES: No bone or joint deformity; age-appropriate arthropathy of the hands and knees; no edema; no ulcerations. Genitalia: not examined PULSES: 2+ and symmetric SKIN: Normal hydration no rash or ulceration CNS: Cranial nerves 2-12 grossly intact no focal lateralizing neurologic deficit   Labs on Admission:  Basic Metabolic Panel:  Recent Labs Lab 04/29/13 1532  NA 126*  K 3.9  CL 92*  CO2 20  GLUCOSE 671*  BUN 11  CREATININE 0.96  CALCIUM 9.3   Liver Function Tests:  Recent Labs Lab 04/29/13 1532  AST 20  ALT 24  ALKPHOS 141*  BILITOT 0.6  PROT  6.8  ALBUMIN 2.9*    Recent Labs Lab 04/29/13 1532  LIPASE 17   No results found for this basename: AMMONIA,  in the last 168 hours CBC:  Recent Labs Lab 04/29/13 1532  WBC 12.1*  NEUTROABS 10.1*  HGB 13.1  HCT 38.4  MCV 90.4  PLT 157   Cardiac Enzymes: No results found for this basename: CKTOTAL, CKMB, CKMBINDEX, TROPONINI,  in the last 168 hours BNP: No components found with this basename: POCBNP,  D-dimer: No components found with this basename: D-DIMER,  CBG:  Recent Labs Lab 04/29/13 1759 04/29/13 1921 04/29/13 2046 04/29/13 2151  GLUCAP 505* 398* 396* 344*    Radiological Exams on Admission: Dg Chest 2 View  04/29/2013   CLINICAL DATA:  Fever.  Shortness of breath.  EXAM: CHEST  2 VIEW  COMPARISON:  01/25/2010  FINDINGS: Heart size is within normal limits. Chronic central peribronchial thickening and interstitial prominence is unchanged.  No evidence of acute or superimposed infiltrate. No evidence of pleural effusion. No mass or lymphadenopathy identified.  IMPRESSION: Stable exam. No active disease.   Electronically Signed   By: Myles Rosenthal M.D.   On: 04/29/2013 17:43   Ct Angio Chest Pe W/cm &/or Wo Cm  04/29/2013   CLINICAL DATA:  Right abdominal pain with fever and chills for 4 days. Recently diagnosed with chikungunya disease.  EXAM: CT ANGIOGRAPHY CHEST  CT ABDOMEN AND PELVIS WITH CONTRAST  TECHNIQUE: Multidetector CT imaging of the chest was performed using the standard protocol during bolus administration of intravenous contrast. Multiplanar CT image reconstructions including MIPs were obtained to evaluate the vascular anatomy. Multidetector CT imaging of the abdomen and pelvis was performed using the standard protocol during bolus administration of intravenous contrast.  CONTRAST:  OMNIPAQUE IOHEXOL 350 MG/ML SOLN  COMPARISON:  None.  FINDINGS: CTA CHEST FINDINGS  There is satisfactory but not optimal opacification of the pulmonary arteries. No  pulmonary emboli are demonstrated. There is mild atherosclerosis of the aorta and great vessels.  Small subcarinal and right hilar lymph nodes are noted, not pathologically enlarged. There is a small hiatal hernia. There is a small amount of pleural thickening on the right. No significant pleural or pericardial effusion is present. There is a probable sebaceous cyst anterior to the lower sternum.  Mild pulmonary emphysematous changes are noted. There is mild atelectasis or scarring in the right lower lobe. No confluent airspace opacity or endobronchial lesion is present.  CT ABDOMEN and PELVIS FINDINGS  There is mild extrahepatic biliary dilatation status post cholecystectomy. The liver and pancreas appear normal. The spleen and adrenal glands appear normal. The right kidney has a horizontal lie, but no focal cortical abnormality. There is no hydronephrosis.  There is aortoiliac atherosclerosis. No enlarged abdominal pelvic lymph nodes are seen. The subacute small bowel appear normal aside from a lipoma within the 3rd portion of the duodenum. The appendix appears normal. Sigmoid colon diverticular changes are present without surrounding inflammation.  There is no pelvic mass status post hysterectomy. The bladder appears normal. There is a suprapubic ventral hernia containing fat.  Compression deformities at L3 and L4 are unchanged from a prior lumbar spine radiographs. No acute osseous findings are seen.  Review of the MIP images confirms the above findings.  IMPRESSION: 1. No evidence of acute pulmonary embolism. 2. No acute chest findings identified. There is mild right basilar scarring or atelectasis with pleural thickening. No significant pleural effusion. 3. No acute abdominal pelvic findings. 4. Suprapubic ventral hernia containing fat, atherosclerosis, chronic lumbar compression deformities and sigmoid colon diverticulosis are noted.   Electronically Signed   By: Roxy Horseman M.D.   On: 04/29/2013 17:40    Ct Abdomen Pelvis W Contrast  04/29/2013   CLINICAL DATA:  Right abdominal pain with fever and chills for 4 days. Recently diagnosed with chikungunya disease.  EXAM: CT ANGIOGRAPHY CHEST  CT ABDOMEN AND PELVIS WITH CONTRAST  TECHNIQUE: Multidetector CT imaging of the chest was performed using the standard protocol during bolus administration of intravenous contrast. Multiplanar CT image reconstructions including MIPs were obtained to evaluate the vascular anatomy. Multidetector CT imaging of the abdomen and pelvis was performed using the standard protocol during bolus administration of intravenous contrast.  CONTRAST:  OMNIPAQUE IOHEXOL 350 MG/ML SOLN  COMPARISON:  None.  FINDINGS: CTA CHEST FINDINGS  There is satisfactory but not optimal opacification of the pulmonary arteries. No pulmonary emboli are demonstrated. There is  mild atherosclerosis of the aorta and great vessels.  Small subcarinal and right hilar lymph nodes are noted, not pathologically enlarged. There is a small hiatal hernia. There is a small amount of pleural thickening on the right. No significant pleural or pericardial effusion is present. There is a probable sebaceous cyst anterior to the lower sternum.  Mild pulmonary emphysematous changes are noted. There is mild atelectasis or scarring in the right lower lobe. No confluent airspace opacity or endobronchial lesion is present.  CT ABDOMEN and PELVIS FINDINGS  There is mild extrahepatic biliary dilatation status post cholecystectomy. The liver and pancreas appear normal. The spleen and adrenal glands appear normal. The right kidney has a horizontal lie, but no focal cortical abnormality. There is no hydronephrosis.  There is aortoiliac atherosclerosis. No enlarged abdominal pelvic lymph nodes are seen. The subacute small bowel appear normal aside from a lipoma within the 3rd portion of the duodenum. The appendix appears normal. Sigmoid colon diverticular changes are present without  surrounding inflammation.  There is no pelvic mass status post hysterectomy. The bladder appears normal. There is a suprapubic ventral hernia containing fat.  Compression deformities at L3 and L4 are unchanged from a prior lumbar spine radiographs. No acute osseous findings are seen.  Review of the MIP images confirms the above findings.  IMPRESSION: 1. No evidence of acute pulmonary embolism. 2. No acute chest findings identified. There is mild right basilar scarring or atelectasis with pleural thickening. No significant pleural effusion. 3. No acute abdominal pelvic findings. 4. Suprapubic ventral hernia containing fat, atherosclerosis, chronic lumbar compression deformities and sigmoid colon diverticulosis are noted.   Electronically Signed   By: Roxy Horseman M.D.   On: 04/29/2013 17:40      Assessment/Plan   Active Problems:   Body aches   Diabetes mellitus type 2, uncontrolled  dehydration Hyponatremia  PLAN: We'll admit this lady for control of blood sugar and hydration and correction of metabolic derangement   Other plans as per orders.  Code Status: Full code   Mellony Danziger Nocturnist [facility] Pager [phone]   04/29/2013, 10:06 PM

## 2013-04-29 NOTE — ED Notes (Signed)
Pt c/o right side pain that radiates to upper back and chest. Pt also reports fever, chills and intermittent SOB. All symptoms began on Wednesday. Pt states she was seen at another facility for these symptoms and was told she had possible chikungunya virus due to recent visits to [place].

## 2013-04-29 NOTE — ED Notes (Signed)
Pt recently visited [place] twice in the past month, last visit was arrival day of 04/19/2013, started having chills on the 24th, felt bad while in [place], flew home on 04/25/2013, was seen at [facility] 04/27/2013. Pt c/o fever, fatigue, generalized body aches, right side chest and abd pain, pain is worse with respirations, reports that laying on right side makes the pain worse. Pt states that Robbie Lis was concerned with the diagnosis of chikungunya virus, had blood work performed but results will not be available for 4-14 days.

## 2013-04-29 NOTE — ED Notes (Signed)
CRITICAL VALUE ALERT  Critical value received:  Glucose 671  Date of notification:  04/29/13  Time of notification:  1609  Critical value read back:yes  Nurse who received alert:  GM  MD notified (1st page):  1609  Time of first page:  1609  MD notified (2nd page):  Time of second page:  Responding MD:  Milinda Pointer  Time MD responded:  [phone]

## 2013-04-29 NOTE — ED Notes (Addendum)
Patient sitting in bed holding arm around abdomen complaining of pain. PRN order for Morphine to be given. Patient rates pain at a 10.

## 2013-04-29 NOTE — ED Provider Notes (Signed)
CSN: [id number]     Arrival date & time 04/29/13  1243 History   First MD Initiated Contact with Patient 04/29/13 1510     Chief Complaint  Patient presents with  . Fever  . Abdominal Pain  . Generalized Body Aches    HPI Pt was seen at 1510. Per pt, c/o gradual onset and persistence of constant multiple symptoms for the past 9 days. Pt states she went to [place] on 10/23 and on 10/24 she began to have symptoms. Symptoms include: generalized body aches/fatigue, fevers/chills, right sided abd pain that "radiates up into the right side of my chest." States laying on her right side makes her symptoms worse. States she has been alternating tylenol and motrin without relief of her fevers. Pt states she was evaluated by her PMD for same on 10/31, dx possible chikungunya virus with blood sent for testing. States her abd pain continues so so came to the ED for further eval. Denies rash, no focal motor weakness, no tingling/numbness in extremities, no N/V/D, no cough, no palpitations, no calf/LE pain or unilateral swelling.    Past Medical History  Diagnosis Date  . Diabetes mellitus   . History of stress test 01/2010    normal myoview stress test  . Chronic back pain   . Depression   . Chronic knee pain   . Hypertension    Past Surgical History  Procedure Laterality Date  . Back surgery    . Cholecystectomy    . Abdominal hysterectomy      History  Substance Use Topics  . Smoking status: Current Every Day Smoker  . Smokeless tobacco: Not on file  . Alcohol Use: Yes    Review of Systems ROS: Statement: All systems negative except as marked or noted in the HPI; Constitutional: +fever and chills, generalized body aches/fatigue. ; ; Eyes: Negative for eye pain, redness and discharge. ; ; ENMT: Negative for ear pain, hoarseness, nasal congestion, sinus pressure and sore throat. ; ; Cardiovascular: +CP. Negative for palpitations, diaphoresis, dyspnea and peripheral edema. ; ; Respiratory:  Negative for cough, wheezing and stridor. ; ; Gastrointestinal: +abd pain. Negative for nausea, vomiting, diarrhea, blood in stool, hematemesis, jaundice and rectal bleeding. . ; ; Genitourinary: Negative for dysuria, flank pain and hematuria. ; ; Musculoskeletal:  Negative for neck pain. Negative for swelling and trauma.; ; Skin: Negative for pruritus, rash, abrasions, blisters, bruising and skin lesion.; ; Neuro: Negative for headache, lightheadedness and neck stiffness. Negative for altered level of consciousness , altered mental status, extremity weakness, paresthesias, involuntary movement, seizure and syncope.      Allergies  Ciprofloxacin; Hydrocodone-acetaminophen; and Penicillins  Home Medications   Current Outpatient Rx  Name  Route  Sig  Dispense  Refill  . diazepam (VALIUM) 10 MG tablet   Oral   Take 10 mg by mouth daily as needed for anxiety.          . DULoxetine (CYMBALTA) 60 MG capsule   Oral   Take 60 mg by mouth daily.         . insulin lispro (HUMALOG) 100 UNIT/ML injection   Subcutaneous   Inject 20 Units into the skin 3 (three) times daily before meals.         . meloxicam (MOBIC) 15 MG tablet   Oral   Take 15 mg by mouth daily as needed for pain.           BP 132/73  Pulse 107  Temp(Src) 98.1 F (  36.7 C) (Oral)  Resp 24  Ht 5\' 5"  (1.651 m)  Wt 178 lb (80.74 kg)  BMI 29.62 kg/m2  SpO2 100% Physical Exam 1515: Physical examination:  Nursing notes reviewed; Vital signs and O2 SAT reviewed;  Constitutional: Well developed, Well nourished, Well hydrated, In no acute distress; Head:  Normocephalic, atraumatic; Eyes: EOMI, PERRL, No scleral icterus; ENMT: Mouth and pharynx normal, Mucous membranes moist; Neck: Supple, Full range of motion, No lymphadenopathy; Cardiovascular: Regular rate and rhythm, No murmur, rub, or gallop; Respiratory: Breath sounds clear & equal bilaterally, No rales, rhonchi, wheezes.  Speaking full sentences with ease, Normal  respiratory effort/excursion; Chest: Nontender, Movement normal; Abdomen: Soft, +entire right sided anterior and lateral torso tender to palp. No rebound or guarding. No rash. Nondistended, Normal bowel sounds; Genitourinary: No CVA tenderness; Spine:  No midline CS, TS, LS tenderness. +TTP right lumbar paraspinal muscles. No rash.;; Extremities: Pulses normal, No tenderness, No edema, No calf edema or asymmetry.; Neuro: AA&Ox3, Major CN grossly intact.  Speech clear. Climbs on and off stretcher easily by herself. Gait steady.  No gross focal motor or sensory deficits in extremities.; Skin: Color normal, Warm, Dry. No rash.   ED Course  Procedures     EKG Interpretation   None       MDM  MDM Reviewed: previous chart, nursing note and vitals Reviewed previous: labs Interpretation: labs, x-ray and CT scan Total time providing critical care: 30-74 minutes. This excludes time spent performing separately reportable procedures and services. Consults: admitting MD   CRITICAL CARE Performed by: Laray Anger Total critical care time: 35 Critical care time was exclusive of separately billable procedures and treating other patients. Critical care was necessary to treat or prevent imminent or life-threatening deterioration. Critical care was time spent personally by me on the following activities: development of treatment plan with patient and/or surrogate as well as nursing, discussions with consultants, evaluation of patient's response to treatment, examination of patient, obtaining history from patient or surrogate, ordering and performing treatments and interventions, ordering and review of laboratory studies, ordering and review of radiographic studies, pulse oximetry and re-evaluation of patient's condition.    Results for orders placed during the hospital encounter of 04/29/13  URINALYSIS W MICROSCOPIC + REFLEX CULTURE      Result Value Range   Color, Urine   (*) YELLOW   Value:  PATIENT IDENTIFICATION ERROR. PLEASE DISREGARD RESULTS. ACCOUNT WILL BE CREDITED.   APPearance   (*) CLEAR   Value: PATIENT IDENTIFICATION ERROR. PLEASE DISREGARD RESULTS. ACCOUNT WILL BE CREDITED.   Specific Gravity, Urine <1.005 (*) 1.005 - 1.030   pH    5.0 - 8.0   Value: PATIENT IDENTIFICATION ERROR. PLEASE DISREGARD RESULTS. ACCOUNT WILL BE CREDITED.   Glucose, UA   (*) NEGATIVE mg/dL   Value: PATIENT IDENTIFICATION ERROR. PLEASE DISREGARD RESULTS. ACCOUNT WILL BE CREDITED.   Hgb urine dipstick   (*) NEGATIVE   Value: PATIENT IDENTIFICATION ERROR. PLEASE DISREGARD RESULTS. ACCOUNT WILL BE CREDITED.   Bilirubin Urine   (*) NEGATIVE   Value: PATIENT IDENTIFICATION ERROR. PLEASE DISREGARD RESULTS. ACCOUNT WILL BE CREDITED.   Ketones, ur   (*) NEGATIVE mg/dL   Value: PATIENT IDENTIFICATION ERROR. PLEASE DISREGARD RESULTS. ACCOUNT WILL BE CREDITED.   Protein, ur   (*) NEGATIVE mg/dL   Value: PATIENT IDENTIFICATION ERROR. PLEASE DISREGARD RESULTS. ACCOUNT WILL BE CREDITED.   Urobilinogen, UA    0.0 - 1.0 mg/dL   Value: PATIENT IDENTIFICATION ERROR. PLEASE DISREGARD RESULTS. ACCOUNT WILL  BE CREDITED.   Nitrite   (*) NEGATIVE   Value: PATIENT IDENTIFICATION ERROR. PLEASE DISREGARD RESULTS. ACCOUNT WILL BE CREDITED.   Leukocytes, UA   (*) NEGATIVE   Value: PATIENT IDENTIFICATION ERROR. PLEASE DISREGARD RESULTS. ACCOUNT WILL BE CREDITED.   WBC, UA    <3 WBC/hpf   Value: PATIENT IDENTIFICATION ERROR. PLEASE DISREGARD RESULTS. ACCOUNT WILL BE CREDITED.   RBC / HPF    <3 RBC/hpf   Value: PATIENT IDENTIFICATION ERROR. PLEASE DISREGARD RESULTS. ACCOUNT WILL BE CREDITED.   Bacteria, UA   (*) RARE   Value: PATIENT IDENTIFICATION ERROR. PLEASE DISREGARD RESULTS. ACCOUNT WILL BE CREDITED.   Squamous Epithelial / LPF   (*) RARE   Value: PATIENT IDENTIFICATION ERROR. PLEASE DISREGARD RESULTS. ACCOUNT WILL BE CREDITED.   Casts   (*) NEGATIVE   Value: PATIENT IDENTIFICATION ERROR. PLEASE DISREGARD  RESULTS. ACCOUNT WILL BE CREDITED.   Crystals   (*) NEGATIVE   Value: PATIENT IDENTIFICATION ERROR. PLEASE DISREGARD RESULTS. ACCOUNT WILL BE CREDITED.   Sperm, UA       Value: PATIENT IDENTIFICATION ERROR. PLEASE DISREGARD RESULTS. ACCOUNT WILL BE CREDITED.   Urine-Other       Value: PATIENT IDENTIFICATION ERROR. PLEASE DISREGARD RESULTS. ACCOUNT WILL BE CREDITED.  CBC WITH DIFFERENTIAL      Result Value Range   WBC 12.1 (*) 4.0 - 10.5 K/uL   RBC 4.25  3.87 - 5.11 MIL/uL   Hemoglobin 13.1  12.0 - 15.0 g/dL   HCT 16.1  09.6 - 04.5 %   MCV 90.4  78.0 - 100.0 fL   MCH 30.8  26.0 - 34.0 pg   MCHC 34.1  30.0 - 36.0 g/dL   RDW 40.9  81.1 - 91.4 %   Platelets 157  150 - 400 K/uL   Neutrophils Relative % 84 (*) 43 - 77 %   Neutro Abs 10.1 (*) 1.7 - 7.7 K/uL   Lymphocytes Relative 10 (*) 12 - 46 %   Lymphs Abs 1.3  0.7 - 4.0 K/uL   Monocytes Relative 6  3 - 12 %   Monocytes Absolute 0.7  0.1 - 1.0 K/uL   Eosinophils Relative 0  0 - 5 %   Eosinophils Absolute 0.0  0.0 - 0.7 K/uL   Basophils Relative 0  0 - 1 %   Basophils Absolute 0.0  0.0 - 0.1 K/uL  COMPREHENSIVE METABOLIC PANEL      Result Value Range   Sodium 126 (*) 135 - 145 mEq/L   Potassium 3.9  3.5 - 5.1 mEq/L   Chloride 92 (*) 96 - 112 mEq/L   CO2 20  19 - 32 mEq/L   Glucose, Bld 671 (*) 70 - 99 mg/dL   BUN 11  6 - 23 mg/dL   Creatinine, Ser 7.82  0.50 - 1.10 mg/dL   Calcium 9.3  8.4 - 95.6 mg/dL   Total Protein 6.8  6.0 - 8.3 g/dL   Albumin 2.9 (*) 3.5 - 5.2 g/dL   AST 20  0 - 37 U/L   ALT 24  0 - 35 U/L   Alkaline Phosphatase 141 (*) 39 - 117 U/L   Total Bilirubin 0.6  0.3 - 1.2 mg/dL   GFR calc non Af Amer 64 (*) >90 mL/min   GFR calc Af Amer 75 (*) >90 mL/min  LIPASE, BLOOD      Result Value Range   Lipase 17  11 - 59 U/L  LACTIC ACID, PLASMA  Result Value Range   Lactic Acid, Venous 1.4  0.5 - 2.2 mmol/L  BLOOD GAS, ARTERIAL      Result Value Range   FIO2 0.21     O2 Content 21.0     Delivery systems  ROOM AIR     pH, Arterial 7.358  7.350 - 7.450   pCO2 arterial 29.6 (*) 35.0 - 45.0 mmHg   pO2, Arterial 70.1 (*) 80.0 - 100.0 mmHg   Bicarbonate 16.2 (*) 20.0 - 24.0 mEq/L   TCO2 14.8  0 - 100 mmol/L   Acid-base deficit 8.2 (*) 0.0 - 2.0 mmol/L   O2 Saturation 94.4     Patient temperature 37.0     Collection site RIGHT RADIAL     Drawn by [phone]     Sample type ARTERIAL     Allens test (pass/fail) PASS  PASS  GLUCOSE, CAPILLARY      Result Value Range   Glucose-Capillary 505 (*) 70 - 99 mg/dL   Comment 1 Documented in Chart    GLUCOSE, CAPILLARY      Result Value Range   Glucose-Capillary 398 (*) 70 - 99 mg/dL   Dg Chest 2 View 57/01/4695   CLINICAL DATA:  Fever.  Shortness of breath.  EXAM: CHEST  2 VIEW  COMPARISON:  01/25/2010  FINDINGS: Heart size is within normal limits. Chronic central peribronchial thickening and interstitial prominence is unchanged. No evidence of acute or superimposed infiltrate. No evidence of pleural effusion. No mass or lymphadenopathy identified.  IMPRESSION: Stable exam. No active disease.   Electronically Signed   By: Myles Rosenthal M.D.   On: 04/29/2013 17:43   Ct Angio Chest Pe W/cm &/or Wo Cm 04/29/2013   CLINICAL DATA:  Right abdominal pain with fever and chills for 4 days. Recently diagnosed with chikungunya disease.  EXAM: CT ANGIOGRAPHY CHEST  CT ABDOMEN AND PELVIS WITH CONTRAST  TECHNIQUE: Multidetector CT imaging of the chest was performed using the standard protocol during bolus administration of intravenous contrast. Multiplanar CT image reconstructions including MIPs were obtained to evaluate the vascular anatomy. Multidetector CT imaging of the abdomen and pelvis was performed using the standard protocol during bolus administration of intravenous contrast.  CONTRAST:  OMNIPAQUE IOHEXOL 350 MG/ML SOLN  COMPARISON:  None.  FINDINGS: CTA CHEST FINDINGS  There is satisfactory but not optimal opacification of the pulmonary arteries. No pulmonary  emboli are demonstrated. There is mild atherosclerosis of the aorta and great vessels.  Small subcarinal and right hilar lymph nodes are noted, not pathologically enlarged. There is a small hiatal hernia. There is a small amount of pleural thickening on the right. No significant pleural or pericardial effusion is present. There is a probable sebaceous cyst anterior to the lower sternum.  Mild pulmonary emphysematous changes are noted. There is mild atelectasis or scarring in the right lower lobe. No confluent airspace opacity or endobronchial lesion is present.  CT ABDOMEN and PELVIS FINDINGS  There is mild extrahepatic biliary dilatation status post cholecystectomy. The liver and pancreas appear normal. The spleen and adrenal glands appear normal. The right kidney has a horizontal lie, but no focal cortical abnormality. There is no hydronephrosis.  There is aortoiliac atherosclerosis. No enlarged abdominal pelvic lymph nodes are seen. The subacute small bowel appear normal aside from a lipoma within the 3rd portion of the duodenum. The appendix appears normal. Sigmoid colon diverticular changes are present without surrounding inflammation.  There is no pelvic mass status post hysterectomy. The bladder appears  normal. There is a suprapubic ventral hernia containing fat.  Compression deformities at L3 and L4 are unchanged from a prior lumbar spine radiographs. No acute osseous findings are seen.  Review of the MIP images confirms the above findings.  IMPRESSION: 1. No evidence of acute pulmonary embolism. 2. No acute chest findings identified. There is mild right basilar scarring or atelectasis with pleural thickening. No significant pleural effusion. 3. No acute abdominal pelvic findings. 4. Suprapubic ventral hernia containing fat, atherosclerosis, chronic lumbar compression deformities and sigmoid colon diverticulosis are noted.   Electronically Signed   By: Roxy Horseman M.D.   On: 04/29/2013 17:40   Ct Abdomen  Pelvis W Contrast 04/29/2013   CLINICAL DATA:  Right abdominal pain with fever and chills for 4 days. Recently diagnosed with chikungunya disease.  EXAM: CT ANGIOGRAPHY CHEST  CT ABDOMEN AND PELVIS WITH CONTRAST  TECHNIQUE: Multidetector CT imaging of the chest was performed using the standard protocol during bolus administration of intravenous contrast. Multiplanar CT image reconstructions including MIPs were obtained to evaluate the vascular anatomy. Multidetector CT imaging of the abdomen and pelvis was performed using the standard protocol during bolus administration of intravenous contrast.  CONTRAST:  OMNIPAQUE IOHEXOL 350 MG/ML SOLN  COMPARISON:  None.  FINDINGS: CTA CHEST FINDINGS  There is satisfactory but not optimal opacification of the pulmonary arteries. No pulmonary emboli are demonstrated. There is mild atherosclerosis of the aorta and great vessels.  Small subcarinal and right hilar lymph nodes are noted, not pathologically enlarged. There is a small hiatal hernia. There is a small amount of pleural thickening on the right. No significant pleural or pericardial effusion is present. There is a probable sebaceous cyst anterior to the lower sternum.  Mild pulmonary emphysematous changes are noted. There is mild atelectasis or scarring in the right lower lobe. No confluent airspace opacity or endobronchial lesion is present.  CT ABDOMEN and PELVIS FINDINGS  There is mild extrahepatic biliary dilatation status post cholecystectomy. The liver and pancreas appear normal. The spleen and adrenal glands appear normal. The right kidney has a horizontal lie, but no focal cortical abnormality. There is no hydronephrosis.  There is aortoiliac atherosclerosis. No enlarged abdominal pelvic lymph nodes are seen. The subacute small bowel appear normal aside from a lipoma within the 3rd portion of the duodenum. The appendix appears normal. Sigmoid colon diverticular changes are present without surrounding  inflammation.  There is no pelvic mass status post hysterectomy. The bladder appears normal. There is a suprapubic ventral hernia containing fat.  Compression deformities at L3 and L4 are unchanged from a prior lumbar spine radiographs. No acute osseous findings are seen.  Review of the MIP images confirms the above findings.  IMPRESSION: 1. No evidence of acute pulmonary embolism. 2. No acute chest findings identified. There is mild right basilar scarring or atelectasis with pleural thickening. No significant pleural effusion. 3. No acute abdominal pelvic findings. 4. Suprapubic ventral hernia containing fat, atherosclerosis, chronic lumbar compression deformities and sigmoid colon diverticulosis are noted.   Electronically Signed   By: Roxy Horseman M.D.   On: 04/29/2013 17:40    1700:  Na corrects to 140 for hyperglycemia. AG 14. Will dose IVF and start IV insulin. Pt to XT/CT scan now.   1905:  Needs urine recollected due to label error. IV insulin gtt infusing. Fever improving after APAP. T/C to [facility] Dr. Irene Limbo, requests to call Dr. Orvan Falconer. T/C to Dr. Orvan Falconer, case discussed, including:  HPI, pertinent PM/SHx, VS/PE,  dx testing, ED course and treatment:  Agreeable to admit, requests to write temporary orders, obtain stepdown bed with isolation.   Laray Anger, DO 04/30/13 1019

## 2013-04-29 NOTE — ED Notes (Signed)
Per infection control at [facility], pt does not need any special isolation

## 2013-04-30 ENCOUNTER — Encounter (HOSPITAL_COMMUNITY): Payer: Self-pay | Admitting: Internal Medicine

## 2013-04-30 DIAGNOSIS — R739 Hyperglycemia, unspecified: Secondary | ICD-10-CM

## 2013-04-30 DIAGNOSIS — E1110 Type 2 diabetes mellitus with ketoacidosis without coma: Secondary | ICD-10-CM

## 2013-04-30 DIAGNOSIS — E1165 Type 2 diabetes mellitus with hyperglycemia: Secondary | ICD-10-CM | POA: Diagnosis present

## 2013-04-30 DIAGNOSIS — E119 Type 2 diabetes mellitus without complications: Secondary | ICD-10-CM

## 2013-04-30 DIAGNOSIS — I959 Hypotension, unspecified: Secondary | ICD-10-CM

## 2013-04-30 DIAGNOSIS — R52 Pain, unspecified: Secondary | ICD-10-CM | POA: Diagnosis present

## 2013-04-30 LAB — BASIC METABOLIC PANEL
BUN: 14 mg/dL (ref 6–23)
CO2: 19 mEq/L (ref 19–32)
Calcium: 8.4 mg/dL (ref 8.4–10.5)
Chloride: 105 mEq/L (ref 96–112)
Creatinine, Ser: 1.18 mg/dL — ABNORMAL HIGH (ref 0.50–1.10)
GFR calc Af Amer: 58 mL/min — ABNORMAL LOW (ref >90)
GFR calc non Af Amer: 50 mL/min — ABNORMAL LOW (ref >90)
Glucose, Bld: 162 mg/dL — ABNORMAL HIGH (ref 70–99)
Potassium: 3 mEq/L — ABNORMAL LOW (ref 3.5–5.1)
Sodium: 137 mEq/L (ref 135–145)

## 2013-04-30 LAB — GLUCOSE, CAPILLARY
Glucose-Capillary: 142 mg/dL — ABNORMAL HIGH (ref 70–99)
Glucose-Capillary: 144 mg/dL — ABNORMAL HIGH (ref 70–99)
Glucose-Capillary: 149 mg/dL — ABNORMAL HIGH (ref 70–99)
Glucose-Capillary: 154 mg/dL — ABNORMAL HIGH (ref 70–99)
Glucose-Capillary: 156 mg/dL — ABNORMAL HIGH (ref 70–99)
Glucose-Capillary: 170 mg/dL — ABNORMAL HIGH (ref 70–99)
Glucose-Capillary: 172 mg/dL — ABNORMAL HIGH (ref 70–99)
Glucose-Capillary: 198 mg/dL — ABNORMAL HIGH (ref 70–99)
Glucose-Capillary: 203 mg/dL — ABNORMAL HIGH (ref 70–99)
Glucose-Capillary: 205 mg/dL — ABNORMAL HIGH (ref 70–99)
Glucose-Capillary: 221 mg/dL — ABNORMAL HIGH (ref 70–99)
Glucose-Capillary: 280 mg/dL — ABNORMAL HIGH (ref 70–99)

## 2013-04-30 LAB — URINALYSIS W MICROSCOPIC + REFLEX CULTURE
Bilirubin Urine: NEGATIVE
Glucose, UA: 500 mg/dL — AB
Hgb urine dipstick: NEGATIVE
Ketones, ur: NEGATIVE mg/dL
Leukocytes, UA: NEGATIVE
Nitrite: POSITIVE — AB
Protein, ur: 30 mg/dL — AB
Specific Gravity, Urine: 1.01 (ref 1.005–1.030)
Urobilinogen, UA: 1 mg/dL (ref 0.0–1.0)
pH: 5.5 (ref 5.0–8.0)

## 2013-04-30 LAB — CBC
HCT: 32.9 % — ABNORMAL LOW (ref 36.0–46.0)
Hemoglobin: 11.1 g/dL — ABNORMAL LOW (ref 12.0–15.0)
MCH: 30.1 pg (ref 26.0–34.0)
MCHC: 33.7 g/dL (ref 30.0–36.0)
MCV: 89.2 fL (ref 78.0–100.0)
Platelets: 152 10*3/uL (ref 150–400)
RBC: 3.69 MIL/uL — ABNORMAL LOW (ref 3.87–5.11)
RDW: 13 % (ref 11.5–15.5)
WBC: 13.1 10*3/uL — ABNORMAL HIGH (ref 4.0–10.5)

## 2013-04-30 LAB — HEMOGLOBIN A1C
Hgb A1c MFr Bld: 14.9 % — ABNORMAL HIGH (ref <5.7)
Mean Plasma Glucose: 381 mg/dL — ABNORMAL HIGH (ref <117)

## 2013-04-30 LAB — MRSA PCR SCREENING: MRSA by PCR: NEGATIVE

## 2013-04-30 MED ORDER — POTASSIUM CHLORIDE IN NACL 20-0.9 MEQ/L-% IV SOLN
INTRAVENOUS | Status: DC
  Administered 2013-04-30 – 2013-05-02 (×7): via INTRAVENOUS

## 2013-04-30 MED ORDER — ENOXAPARIN SODIUM 40 MG/0.4ML ~~LOC~~ SOLN
40.0000 mg | SUBCUTANEOUS | Status: DC
  Administered 2013-04-30 – 2013-05-02 (×3): 40 mg via SUBCUTANEOUS
  Filled 2013-04-30 (×3): qty 0.4

## 2013-04-30 MED ORDER — SODIUM CHLORIDE 0.9 % IV SOLN: INTRAVENOUS | Status: DC

## 2013-04-30 MED ORDER — IBUPROFEN 800 MG PO TABS
800.0000 mg | ORAL_TABLET | Freq: Two times a day (BID) | ORAL | Status: DC
  Administered 2013-04-30 – 2013-05-02 (×6): 800 mg via ORAL
  Filled 2013-04-30 (×6): qty 1

## 2013-04-30 MED ORDER — DIAZEPAM 5 MG PO TABS
5.0000 mg | ORAL_TABLET | Freq: Two times a day (BID) | ORAL | Status: DC | PRN
Start: 2013-04-30 — End: 2013-05-02
  Administered 2013-04-30: 5 mg via ORAL
  Filled 2013-04-30: qty 1

## 2013-04-30 MED ORDER — DULOXETINE HCL 60 MG PO CPEP
60.0000 mg | ORAL_CAPSULE | Freq: Every day | ORAL | Status: DC
  Administered 2013-04-30 – 2013-05-02 (×3): 60 mg via ORAL
  Filled 2013-04-30 (×3): qty 1

## 2013-04-30 MED ORDER — ONDANSETRON HCL 4 MG/2ML IJ SOLN
4.0000 mg | INTRAMUSCULAR | Status: DC | PRN
  Administered 2013-05-01: 4 mg via INTRAVENOUS
  Filled 2013-04-30: qty 2

## 2013-04-30 MED ORDER — SODIUM CHLORIDE 0.9 % IV SOLN
INTRAVENOUS | Status: DC
  Filled 2013-04-30: qty 1

## 2013-04-30 MED ORDER — TRAZODONE HCL 50 MG PO TABS: 50.0000 mg | ORAL_TABLET | Freq: Every evening | ORAL | Status: DC | PRN

## 2013-04-30 MED ORDER — INSULIN ASPART 100 UNIT/ML ~~LOC~~ SOLN
0.0000 [IU] | Freq: Three times a day (TID) | SUBCUTANEOUS | Status: DC
  Administered 2013-04-30: 3 [IU] via SUBCUTANEOUS
  Administered 2013-04-30 – 2013-05-01 (×3): 5 [IU] via SUBCUTANEOUS

## 2013-04-30 MED ORDER — INSULIN DETEMIR 100 UNIT/ML ~~LOC~~ SOLN
5.0000 [IU] | Freq: Every day | SUBCUTANEOUS | Status: DC
  Administered 2013-04-30: 5 [IU] via SUBCUTANEOUS
  Filled 2013-04-30 (×4): qty 0.05

## 2013-04-30 MED ORDER — PANTOPRAZOLE SODIUM 40 MG PO TBEC
40.0000 mg | DELAYED_RELEASE_TABLET | Freq: Every day | ORAL | Status: DC
  Administered 2013-04-30 – 2013-05-02 (×3): 40 mg via ORAL
  Filled 2013-04-30 (×3): qty 1

## 2013-04-30 MED ORDER — SODIUM CHLORIDE 0.9 % IV BOLUS (SEPSIS)
500.0000 mL | Freq: Once | INTRAVENOUS | Status: AC
  Administered 2013-04-30: 500 mL via INTRAVENOUS

## 2013-04-30 MED ORDER — FLEET ENEMA 7-19 GM/118ML RE ENEM: 1.0000 | ENEMA | Freq: Once | RECTAL | Status: AC | PRN

## 2013-04-30 MED ORDER — POLYETHYLENE GLYCOL 3350 17 G PO PACK
17.0000 g | PACK | Freq: Every day | ORAL | Status: DC
  Administered 2013-04-30 – 2013-05-02 (×3): 17 g via ORAL
  Filled 2013-04-30 (×3): qty 1

## 2013-04-30 MED ORDER — INSULIN REGULAR BOLUS VIA INFUSION
0.0000 [IU] | Freq: Three times a day (TID) | INTRAVENOUS | Status: DC
  Administered 2013-04-30: 4 [IU] via INTRAVENOUS
  Filled 2013-04-30: qty 10

## 2013-04-30 MED ORDER — INSULIN ASPART 100 UNIT/ML ~~LOC~~ SOLN
0.0000 [IU] | Freq: Every day | SUBCUTANEOUS | Status: DC
  Administered 2013-04-30: 3 [IU] via SUBCUTANEOUS

## 2013-04-30 MED ORDER — ACETAMINOPHEN 325 MG PO TABS
650.0000 mg | ORAL_TABLET | ORAL | Status: DC | PRN
  Administered 2013-04-30 – 2013-05-02 (×5): 650 mg via ORAL
  Filled 2013-04-30 (×5): qty 2

## 2013-04-30 MED ORDER — INSULIN ASPART 100 UNIT/ML ~~LOC~~ SOLN
4.0000 [IU] | Freq: Three times a day (TID) | SUBCUTANEOUS | Status: DC
  Administered 2013-04-30 (×2): 4 [IU] via SUBCUTANEOUS

## 2013-04-30 MED ORDER — MORPHINE SULFATE 2 MG/ML IJ SOLN
2.0000 mg | INTRAMUSCULAR | Status: AC | PRN
  Administered 2013-04-30 – 2013-05-01 (×2): 2 mg via INTRAVENOUS
  Filled 2013-04-30 (×2): qty 1

## 2013-04-30 MED ORDER — DEXTROSE 50 % IV SOLN: 25.0000 mL | INTRAVENOUS | Status: DC | PRN

## 2013-04-30 NOTE — Progress Notes (Signed)
PT Cancellation Note  Patient Details Name: Kelli Summers MRN: [id number] DOB: 01-15-1956   Cancelled Treatment:    Reason Eval/Treat Not Completed: PT screened, no needs identified, will sign off   Konrad Penta 04/30/2013, 12:13 PM

## 2013-04-30 NOTE — Progress Notes (Signed)
Nutrition Brief Note  Patient identified on the Malnutrition Screening Tool (MST) Report  Wt Readings from Last 15 Encounters:  04/29/13 183 lb 13.8 oz (83.4 kg)  12/30/12 180 lb (81.647 kg)  09/06/11 194 lb (87.998 kg)    Body mass index is 30.6 kg/(m^2). Patient meets criteria for obesity, class I based on current BMI.   Current diet order is carb modified, patient is consuming approximately 100% of meals at this time. Labs and medications reviewed.   No nutrition interventions warranted at this time. If nutrition issues arise, please consult RD.   Kelli Summers, RD, LDN Pager: [phone]

## 2013-04-30 NOTE — Progress Notes (Signed)
Spoke with patient about diabetes and home regimen for diabetes control.  Patient reports that she sees Dr. Talmage Nap (endocrinologist) for diabetes management and she also adds that she does not see Dr. Talmage Nap like she should.  She states that she knows she needs to do better with her diabetes and she needs to follow up with Dr. Talmage Nap on a regular basis.  In inquiring about home regimen for diabetes patient reports that she usually takes Humalog 20 units TID (depending on what she eats and what her blood sugar is).  Asked if she has ever been on a basal insulin such as Lantus or Levemir.  Patient reports that she has a prescription of either Lantus or Levemir at home in her refrigerator but she does not use it.  In talking with the patient she reports that she stopped taking the basal insulin because it "makes me crave sweets even more that I usually do".  Discussed various types of insulin along with basic pathophysiology of how insulin works to manage diabetes.   Patient reports that she has several family members on her mother and her father's side of the family with diabetes.  She states that she "sees what uncontrolled diabetes can do (kidney failure, heart attack, amputations, stroke)" because she has witnessed these complications in her family members.    Patient's ex-husband was present in the room during discussion and he reports that the patient eats sweets and drinks fluids with sugar "all the time".  He also reports that he takes her pina colada (non-alcoholic) drinks from the convenience store a lot because she craves them.  Talked with patient and her ex-husband about how foods and drinks with carbs are broken down into glucose and the damaging effects of high blood glucose on the blood vessel linings throughout the body.  Discussed in depth the long-term and short-term complications that can occur with uncontrolled diabetes.  Explained importance of taking medications as prescribed, checking CBGs, and  adhering to a diabetic diet to prevent complications associated with uncontrolled diabetes.    Patient reports that she know she needs to do better and choose better foods.  Informed patient about free outpatient diabetes education class and [facility] and provided her to handout information on the class.  She reports that she does need some type of education on carb counting and states that she will go to the class after discharge when she feels better.  Her ex-husband states that he would like to go with her also (for her and for himself since he also has diabetes, type 2).  Encouraged patient to get a mind set and take an active role to help herself and get her diabetes under control before she develops some of the complications discussed.  Patient verbalized understanding of information discussed and is appreciative of information.  She states that she does not have any other questions related to diabetes at this time.  Will continue to follow while inpatient.   Thanks, Orlando Penner, RN, MSN, CCRN Diabetes Coordinator Inpatient Diabetes Program [phone] (Team Pager) [phone] ([facility] office) [phone] [facility] office)

## 2013-04-30 NOTE — Evaluation (Signed)
Occupational Therapy Evaluation Patient Details Name: Kelli Summers MRN: [id number] DOB: 10-07-1955 Today's Date: 04/30/2013 Time: 4098-1191 OT Time Calculation (min): 22 min Evaluation Only   OT Assessment / Plan / Recommendation History of present illness Patient  is a 57 y.o. diabetic lady who recently returned from vacation in [place], [place]. Reports that she arrived in [place] on the 23rd, and then on the 24th or 25th noticed that she was getting very cold nights. This seems to subside but then a couple days later she started having chills and low energy. Her blood sugars during her vacation range between 150 and 200 when tested.    Clinical Impression   Patient with noted increased shortness of breath and increased complaint of pain with ADL tasks, mobility however is at baseline for functional abilities.  Recommend no skilled OT services at this time.    OT Assessment  Patient does not need any further OT services    Follow Up Recommendations    no skilled OT services    Barriers to Discharge  good family support              Precautions / Restrictions Precautions Precautions: None Restrictions Weight Bearing Restrictions: No       ADL  Eating/Feeding: Independent Grooming: Independent Upper Body Dressing: Modified independent Lower Body Dressing: Modified independent Toilet Transfer: Modified independent ADL Comments: increased complaint of shortness of breath and pain in Right quadrant with ADL tasks/activities, mobilities     OT Goals(Current goals can be found in the care plan section) Acute Rehab OT Goals Patient Stated Goal: to feel better and return home   Visit Information  History of Present Illness: Patient  is a 57 y.o. diabetic lady who recently returned from vacation in [place], [place]. Reports that she arrived in [place] on the 23rd, and then on the 24th or 25th noticed that she was getting very cold nights. This seems to subside but then a couple  days later she started having chills and low energy. Her blood sugars during her vacation range between 150 and 200 when tested.        Prior Functioning     Home Living Family/patient expects to be discharged to:: Private residence Living Arrangements: Other relatives Available Help at Discharge: Family Type of Home: Mobile home Home Access: Stairs to enter Secretary/administrator of Steps: 2 Entrance Stairs-Rails: Right Home Layout: One level Home Equipment: None Prior Function Level of Independence: Independent Communication Communication: No difficulties Dominant Hand: Right         Vision/Perception Vision - History Baseline Vision: Other (comment) Patient Visual Report: No change from baseline Vision - Assessment Eye Alignment: Within Functional Limits   Cognition  Cognition Arousal/Alertness: Awake/alert Behavior During Therapy: WFL for tasks assessed/performed Overall Cognitive Status: Within Functional Limits for tasks assessed    Extremity/Trunk Assessment Upper Extremity Assessment Upper Extremity Assessment: Overall WFL for tasks assessed Lower Extremity Assessment Lower Extremity Assessment: Defer to PT evaluation     Mobility Transfers Transfers: Sit to Stand;Stand to Sit Sit to Stand: 6: Modified independent (Device/Increase time) Stand to Sit: 6: Modified independent (Device/Increase time)           End of Session OT - End of Session Activity Tolerance: Patient tolerated treatment well;Patient limited by pain Patient left: in bed;with call bell/phone within reach;with nursing/sitter in room    Velora Mediate, OTR/L 04/30/2013, 1:10 PM

## 2013-04-30 NOTE — Progress Notes (Signed)
Report called to Joneen Boers, RN.  Patient ready for transfer to room 319.  No acute distress noted.  Out via wheelchair to department 300.

## 2013-04-30 NOTE — Progress Notes (Signed)
[facility] chart review completed.  

## 2013-04-30 NOTE — Progress Notes (Signed)
[facility] PROGRESS NOTE  Kelli Summers GNF:[id number] DOB: 08-31-1955 DOA: 04/29/2013 PCP: Colette Ribas, MD Dr Talmage Nap for endocrinology  Dr Evelene Croon for Psych q 6-8 months  Assessment/Plan: 1. Hyperglycemia with suspected DKA on admission: Anion gap has normalized, blood sugar control now stable. Likely secondary to febrile illness. 2. Hypotension: Likely secondary to dehydration, poor oral intake over the last several days. Asymptomatic. No signs or symptoms to suggest sepsis. Lactic acid was normal. 3. Diabetes mellitus: Now stabilized. Start subcutaneous insulin. Followup hemoglobin A1c. 4. Chronic back pain: Appears stable. 5. Depression: Appears stable.   Change to subcutaneous insulin, stop insulin infusion  Continue IV fluids  Transfer to medical floor  Pending studies:   Hemoglobin A1c  Code Status: Full code DVT prophylaxis: Lovenox Family Communication: None present Disposition Plan: Home when improved  Brendia Sacks, MD  [facility]  Pager [phone] If 7PM-7AM, please contact night-coverage at [email or web address], password [facility] 04/30/2013, 7:47 AM  LOS: 1 day   Summary: 57 year old woman recently vacationed in [place]. Shortly afterward she developed fever, myalgias, arthralgias and she expressed the concern that she may contracted chikungunya virus in [place]. She was found to have hyperglycemia and admitted to the step down unit for insulin infusion.  Consultants:    Procedures:    HPI/Subjective: No significant change. This generalized muscle aches, headache, right-sided chest pain. Eating okay. No vomiting. Intermittently hypotensive overnight.  Objective: Filed Vitals:   04/30/13 0500 04/30/13 0515 04/30/13 0530 04/30/13 0737  BP: 91/58 92/60 87/53    Pulse:      Temp:    97.5 F (36.4 C)  TempSrc:    Oral  Resp: 18 22 15    Height:      Weight:      SpO2:        Intake/Output Summary (Last 24 hours) at 04/30/13 0747 Last data  filed at 04/30/13 0500  Gross per 24 hour  Intake 1350.6 ml  Output      0 ml  Net 1350.6 ml     Filed Weights   04/29/13 1301 04/29/13 2240  Weight: 80.74 kg (178 lb) 83.4 kg (183 lb 13.8 oz)    Exam:   Afebrile. Intermittently hypotensive. Normal heart rate and oxygenation.  General:  Appears calm and comfortable. Sitting on the side of the bed.  Cardiovascular: Regular rate and rhythm. No murmur, rub or gallop.  Telemetry: Sinus rhythm.  Respiratory: Clear to auscultation bilaterally. No wheezes, rales or rhonchi. Normal respiratory effort.  Abdomen: Soft, nontender, nondistended  Psychiatric: Grossly normal mood and affect. Speech fluent and appropriate.  Data Reviewed:  Capillary blood sugars now stable.  Anion gap has resolved. Normal BUN. Potassium 3.0.  CBC without significant change, minimal leukocytosis.  Urinalysis negative  CT chest, abdomen and pelvis unremarkable  Scheduled Meds: . DULoxetine  60 mg Oral Daily  . enoxaparin (LOVENOX) injection  40 mg Subcutaneous Q24H  . ibuprofen  800 mg Oral BID  . insulin regular  0-10 Units Intravenous TID WC  . pantoprazole  40 mg Oral Daily   Continuous Infusions: . sodium chloride    . 0.9 % NaCl with KCl 20 mEq / L 125 mL/hr at 04/30/13 0623  . insulin (NOVOLIN-R) infusion 4.2 Units/hr (04/30/13 0727)    Principal Problem:   DKA (diabetic ketoacidoses) Active Problems:   Body aches   Diabetes mellitus type 2, uncontrolled   Hyperglycemia   Hypotension, unspecified   Time spent 20 minutes

## 2013-04-30 NOTE — Progress Notes (Signed)
Paged mid level MD. Pt requests stronger pain medication for abdominal pain. Requests morphine. Allergy to hydrocodone and oxycodone. Tylenol not working per pt.

## 2013-05-01 LAB — GLUCOSE, CAPILLARY
Glucose-Capillary: 276 mg/dL — ABNORMAL HIGH (ref 70–99)
Glucose-Capillary: 287 mg/dL — ABNORMAL HIGH (ref 70–99)
Glucose-Capillary: 279 mg/dL — ABNORMAL HIGH (ref 70–99)
Glucose-Capillary: 230 mg/dL — ABNORMAL HIGH (ref 70–99)
Glucose-Capillary: 202 mg/dL — ABNORMAL HIGH (ref 70–99)

## 2013-05-01 LAB — BASIC METABOLIC PANEL
BUN: 21 mg/dL (ref 6–23)
CO2: 16 mEq/L — ABNORMAL LOW (ref 19–32)
Calcium: 8.4 mg/dL (ref 8.4–10.5)
Chloride: 103 mEq/L (ref 96–112)
Creatinine, Ser: 0.95 mg/dL (ref 0.50–1.10)
GFR calc Af Amer: 76 mL/min — ABNORMAL LOW (ref >90)
GFR calc non Af Amer: 65 mL/min — ABNORMAL LOW (ref >90)
Glucose, Bld: 317 mg/dL — ABNORMAL HIGH (ref 70–99)
Potassium: 4 mEq/L (ref 3.5–5.1)
Sodium: 133 mEq/L — ABNORMAL LOW (ref 135–145)

## 2013-05-01 MED ORDER — INSULIN DETEMIR 100 UNIT/ML ~~LOC~~ SOLN
15.0000 [IU] | Freq: Every day | SUBCUTANEOUS | Status: DC
  Administered 2013-05-01: 15 [IU] via SUBCUTANEOUS
  Filled 2013-05-01 (×2): qty 0.15

## 2013-05-01 MED ORDER — INSULIN ASPART 100 UNIT/ML ~~LOC~~ SOLN
0.0000 [IU] | Freq: Every day | SUBCUTANEOUS | Status: DC
  Administered 2013-05-01: 2 [IU] via SUBCUTANEOUS

## 2013-05-01 MED ORDER — INSULIN ASPART 100 UNIT/ML ~~LOC~~ SOLN
0.0000 [IU] | Freq: Three times a day (TID) | SUBCUTANEOUS | Status: DC
  Administered 2013-05-01: 5 [IU] via SUBCUTANEOUS
  Administered 2013-05-02 (×2): 3 [IU] via SUBCUTANEOUS

## 2013-05-01 MED ORDER — MORPHINE SULFATE 2 MG/ML IJ SOLN
2.0000 mg | INTRAMUSCULAR | Status: AC | PRN
  Administered 2013-05-01 – 2013-05-02 (×2): 2 mg via INTRAVENOUS
  Filled 2013-05-01 (×2): qty 1

## 2013-05-01 MED ORDER — INSULIN ASPART 100 UNIT/ML ~~LOC~~ SOLN
6.0000 [IU] | Freq: Three times a day (TID) | SUBCUTANEOUS | Status: DC
  Administered 2013-05-02 (×2): 6 [IU] via SUBCUTANEOUS

## 2013-05-01 NOTE — Progress Notes (Signed)
Inpatient Diabetes Program Recommendations  AACE/ADA: New Consensus Statement on Inpatient Glycemic Control (2013)  Target Ranges:  Prepandial:   less than 140 mg/dL      Peak postprandial:   less than 180 mg/dL (1-2 hours)      Critically ill patients:  140 - 180 mg/dL   Results for Kelli Summers, Kelli Summers (MRN [id number]) as of 05/01/2013 10:56  Ref. Range 04/30/2013 07:23 04/30/2013 08:19 04/30/2013 09:30 04/30/2013 11:29 04/30/2013 16:12 04/30/2013 20:56 05/01/2013 07:40  Glucose-Capillary Latest Range: 70-99 mg/dL 782 (H) 956 (H) 213 (H) 205 (H) 280 (H) 276 (H) 287 (H)    Inpatient Diabetes Program Recommendations Insulin - Basal: Please consider increasing Levemir to 15 units daily. Correction (SSI): Please consider increasing Novolog correction to moderate scale. Insulin - Meal Coverage: Please consider increasing Novolog meal coverage to 6 units TID with meals.  Note: Patient was transitioned off IV insulin yesterday to SQ insulin and was given Levemir 5 units yesterday at 9:45am.  Post prandial glucose consistently elevated and fasting glucose this morning was 287 mg/dl.  Please consider increasing Levemir to 15 units daily, increasing Novolog correction to moderate scale, and increasing Novolog meal coverage to 6 units TID with meals.  Will continue to follow.  Thanks, Orlando Penner, RN, MSN, CCRN Diabetes Coordinator Inpatient Diabetes Program [phone] (Team Pager) [phone] ([facility] office) [phone] [facility] office)

## 2013-05-01 NOTE — Progress Notes (Signed)
[facility] PROGRESS NOTE  RAYNELLE FUJIKAWA WUJ:[id number] DOB: May 06, 1956 DOA: 04/29/2013 PCP: Colette Ribas, MD Dr Talmage Nap for endocrinology  Dr Evelene Croon for Psych q 6-8 months  Assessment/Plan: 1. Hyperglycemia with suspected DKA on admission: Quickly improved with IV insulin and transitioned to subcutaneous insulin. Probably secondary to noncompliance as patient has not been taking insulin, possibly contributed to by febrile illness. 2. Hypotension: Overall still running slightly low but nontoxic. Likely secondary to dehydration, poor oral intake over the last several days. Asymptomatic. No signs or symptoms to suggest sepsis. Lactic acid was normal. 3. Diabetes mellitus uncontrolled hemoglobin A1c 14.9: Hyperglycemic. Insulin adjustment as below. Check basic metabolic panel the morning. 4. Nausea with vomiting: Appears resolved currently. No abdominal pain. Follow clinically. 5. Chronic back pain: Stable. 6. Depression: Appears stable.   Increase Levemir to 15 units daily. Increase NovoLog correction to moderate scale. Increase meal coverage to 6 units 3 times a day.  Check metabolic panel in the morning  Likely old blood sugars stable in the next one to 2 days  Pending studies:   Hemoglobin A1c  Code Status: Full code DVT prophylaxis: Lovenox Family Communication: None present Disposition Plan: Home when improved  Brendia Sacks, MD  [facility]  Pager [phone] If 7PM-7AM, please contact night-coverage at [email or web address], password [facility] 05/01/2013, 4:37 PM  LOS: 2 days   Summary: 57 year old woman recently vacationed in [place]. Shortly afterward she developed fever, myalgias, arthralgias and she expressed the concern that she may contracted chikungunya virus in [place]. She was found to have hyperglycemia and admitted to the step down unit for insulin infusion.  Consultants:    Procedures:    HPI/Subjective: Overall about the same. No abdominal pain  besides nausea. Vomited up breakfast but so far has tolerated lunch. Continues to have right-sided muscle aches.  Objective: Filed Vitals:   05/01/13 0503 05/01/13 0504 05/01/13 0506 05/01/13 1350  BP: 118/76 115/75 103/69 99/64  Pulse: 105 103 102 101  Temp: 97.9 F (36.6 C) 97.9 F (36.6 C) 97.9 F (36.6 C) 98.1 F (36.7 C)  TempSrc: Oral Oral Oral Oral  Resp: 20 20 21 20   Height:      Weight:      SpO2: 92% 91% 92% 94%    Intake/Output Summary (Last 24 hours) at 05/01/13 1637 Last data filed at 05/01/13 1632  Gross per 24 hour  Intake 3947.92 ml  Output    500 ml  Net 3447.92 ml     Filed Weights   04/29/13 1301 04/29/13 2240  Weight: 80.74 kg (178 lb) 83.4 kg (183 lb 13.8 oz)    Exam:   Afebrile. Vitals stable.  General: Appears calm and comfortable.. Better today.  Cardiovascular: Regular rate and rhythm. No murmur, rub or gallop. No lower extremity edema.  Respiratory: Clear to auscultation bilaterally. No wheezes, rales or rhonchi. Normal respiratory effort.  Abdomen: Soft, nontender, nondistended, positive bowel sounds.  Psychiatric: Grossly normal and affect. Speech fluent and appropriate.  Data Reviewed:  Chemistry panel notable for hyperglycemia, mild hyponatremia.  Capillary blood sugars and 200s.  Scheduled Meds: . DULoxetine  60 mg Oral Daily  . enoxaparin (LOVENOX) injection  40 mg Subcutaneous Q24H  . ibuprofen  800 mg Oral BID  . insulin aspart  0-5 Units Subcutaneous QHS  . insulin aspart  0-9 Units Subcutaneous TID WC  . insulin aspart  4 Units Subcutaneous TID WC  . insulin detemir  5 Units Subcutaneous Daily  . pantoprazole  40 mg  Oral Daily  . polyethylene glycol  17 g Oral Daily   Continuous Infusions: . 0.9 % NaCl with KCl 20 mEq / L 125 mL/hr at 05/01/13 1519    Principal Problem:   DKA (diabetic ketoacidoses) Active Problems:   Body aches   Diabetes mellitus type 2, uncontrolled   Hyperglycemia   Hypotension,  unspecified   Time spent 20 minutes

## 2013-05-01 NOTE — Care Management Note (Addendum)
    Page 1 of 1   05/03/2013     11:29:50 AM   CARE MANAGEMENT NOTE 05/03/2013  Patient:  Kelli Summers, Kelli Summers   Account Number:  [id number]  Date Initiated:  05/01/2013  Documentation initiated by:  Rosemary Holms  Subjective/Objective Assessment:   Pt admitted from home where she lives with family and more live nearby. No HH needs anticipated     Action/Plan:   return home   Anticipated DC Date:  05/02/2013   Anticipated DC Plan:  HOME/SELF CARE      DC Planning Services  CM consult      Choice offered to / List presented to:             Status of service:  Completed, signed off Medicare Important Message given?   (If response is "NO", the following Medicare IM given date fields will be blank) Date Medicare IM given:   Date Additional Medicare IM given:    Discharge Disposition:    Per UR Regulation:  Reviewed for med. necessity/level of care/duration of stay  If discussed at Long Length of Stay Meetings, dates discussed:    Comments:   05/02/13 1300 Anibal Henderson RN/CM pt saw diabetic educater and plans to come to diabetic classes here at [facility] as OP 05/01/13/Amy Leanord Hawking RN BSN CM

## 2013-05-02 ENCOUNTER — Encounter (HOSPITAL_COMMUNITY): Payer: Self-pay

## 2013-05-02 LAB — BASIC METABOLIC PANEL
BUN: 12 mg/dL (ref 6–23)
CO2: 17 mEq/L — ABNORMAL LOW (ref 19–32)
Calcium: 9 mg/dL (ref 8.4–10.5)
Chloride: 106 mEq/L (ref 96–112)
Creatinine, Ser: 0.71 mg/dL (ref 0.50–1.10)
GFR calc Af Amer: >90 mL/min (ref >90)
GFR calc non Af Amer: >90 mL/min (ref >90)
Glucose, Bld: 220 mg/dL — ABNORMAL HIGH (ref 70–99)
Potassium: 4.3 mEq/L (ref 3.5–5.1)
Sodium: 135 mEq/L (ref 135–145)

## 2013-05-02 LAB — GLUCOSE, CAPILLARY
Glucose-Capillary: 187 mg/dL — ABNORMAL HIGH (ref 70–99)
Glucose-Capillary: 187 mg/dL — ABNORMAL HIGH (ref 70–99)

## 2013-05-02 LAB — CBC
HCT: 32.3 % — ABNORMAL LOW (ref 36.0–46.0)
Hemoglobin: 10.9 g/dL — ABNORMAL LOW (ref 12.0–15.0)
MCH: 30.3 pg (ref 26.0–34.0)
MCHC: 33.7 g/dL (ref 30.0–36.0)
MCV: 89.7 fL (ref 78.0–100.0)
Platelets: 189 10*3/uL (ref 150–400)
RBC: 3.6 MIL/uL — ABNORMAL LOW (ref 3.87–5.11)
RDW: 13.6 % (ref 11.5–15.5)
WBC: 10.4 10*3/uL (ref 4.0–10.5)

## 2013-05-02 LAB — HEPATIC FUNCTION PANEL
ALT: 43 U/L — ABNORMAL HIGH (ref 0–35)
AST: 26 U/L (ref 0–37)
Albumin: 2.1 g/dL — ABNORMAL LOW (ref 3.5–5.2)
Alkaline Phosphatase: 216 U/L — ABNORMAL HIGH (ref 39–117)
Bilirubin, Direct: 0.4 mg/dL — ABNORMAL HIGH (ref 0.0–0.3)
Indirect Bilirubin: 0.2 mg/dL — ABNORMAL LOW (ref 0.3–0.9)
Total Bilirubin: 0.6 mg/dL (ref 0.3–1.2)
Total Protein: 5.9 g/dL — ABNORMAL LOW (ref 6.0–8.3)

## 2013-05-02 LAB — LIPASE, BLOOD: Lipase: 13 U/L (ref 11–59)

## 2013-05-02 MED ORDER — OXYCODONE-ACETAMINOPHEN 5-325 MG PO TABS: 1.0000 | ORAL_TABLET | Freq: Four times a day (QID) | ORAL | Status: DC | PRN

## 2013-05-02 MED ORDER — INSULIN DETEMIR 100 UNIT/ML ~~LOC~~ SOLN: 20.0000 [IU] | Freq: Every day | SUBCUTANEOUS | Status: DC

## 2013-05-02 MED ORDER — INSULIN LISPRO 100 UNIT/ML ~~LOC~~ SOLN: 6.0000 [IU] | Freq: Three times a day (TID) | SUBCUTANEOUS | Status: DC

## 2013-05-02 MED ORDER — PANTOPRAZOLE SODIUM 40 MG PO TBEC: 40.0000 mg | DELAYED_RELEASE_TABLET | Freq: Every day | ORAL | Status: DC

## 2013-05-02 NOTE — Discharge Planning (Signed)
Pt stated that she was ready to go home and pain was under control.  Pt's IV was removed. Pt given DC paper and educated on DKA and s/sx to look for that might require a call to the doctor or return to the hospital.    Pt was told of FU appointments and also given scripts to take to pharmacy.  Family was also in the room at time of DC teaching.  Pt was wheeled to car by PCT and family.

## 2013-05-02 NOTE — Discharge Summary (Signed)
Physician Discharge Summary  Kelli Summers:[id number] DOB: May 13, 1956 DOA: 04/29/2013  PCP: Colette Ribas, MD  Admit date: 04/29/2013 Discharge date: 05/02/2013  Time spent: 45 minutes  Recommendations for Outpatient Follow-up:  1. Followup primary care physician in one to 2 weeks 2. Follow up with endocrinologist as previously scheduled.  Discharge Diagnoses:  Principal Problem:   DKA (diabetic ketoacidoses) Active Problems:   Body aches   Diabetes mellitus type 2, uncontrolled   Hyperglycemia   Hypotension, unspecified   Discharge Condition: improved  Diet recommendation: low carb  Filed Weights   04/29/13 1301 04/29/13 2240  Weight: 80.74 kg (178 lb) 83.4 kg (183 lb 13.8 oz)    History of present illness:  Kelli Summers is a 57 y.o. female. Diabetic lady who recently returned from vacation in [place], [place]. Reports that she arrived in [place] on the 23rd, and then on the 24th or 25th noticed that she was getting very cold nights. This seems to subside but then a couple days later she started having chills and low energy. Her blood sugars during her vacation range between 150 and 200 when tested.  She returned to the [place] on the 29th and she felt fine when she got home, but the next day she started feeling bad again having low energy and then now pain all over all her joints hurt.  As a result of the above she came to the emergency room to be evaluated expressing concern that she may have contracted chikungunya virus in [place]. After this was explained to her that the timeline is unlikely for such an infection she reports that then began to complain of chest pain and eventually the hospitalist service was called to admit her because of uncontrolled blood sugars  She reports that this morning her fasting blood sugar was 164, but she felt somewhat bloated and only get half of her usual meal, and therefore only took half her usual dose of insulin.  Her insulin is managed  with Humalog 3 times a day by sliding scale  She takes no long-acting insulin   Hospital Course:  This patient was admitted to the hospital with low-grade fevers, nausea vomiting, hyperglycemia. She was noncompliant with her insulin therapy at home. She recently made a trip to [place] and feels that she contracted a viral illness. She was seen by her primary care physician who sent titers for a variety of infections. She was admitted to the hospital with hyperglycemia requiring insulin infusion. She was subsequently transitioned to subcutaneous insulin and blood sugars have remained stable. Her diffuse myalgias have improved, but she continues to have headache and right-sided pleuritic chest pain. All of the symptoms may be related to an underlying viral illness. She's been afebrile for the last 48 hours. CT scan of the chest abdomen and pelvis was unremarkable. Her dehydration is improving with IV fluids she is advised to keep herself adequately hydrated. She will followup with her endocrinologist for further management diabetes. The patient is stable for discharge home. Procedures:  none  Consultations:  none  Discharge Exam: Filed Vitals:   05/02/13 1400  BP: 146/76  Pulse: 113  Temp: 98.2 F (36.8 C)  Resp: 20    General: NAD Cardiovascular: S1, S2 RRR Respiratory: cta b  Discharge Instructions  Discharge Orders   Future Orders Complete By Expires   Diet - low sodium heart healthy  As directed    Diet Carb Modified  As directed    Increase activity slowly  As directed        Medication List         diazepam 10 MG tablet  Commonly known as:  VALIUM  Take 10 mg by mouth daily as needed for anxiety.     DULoxetine 60 MG capsule  Commonly known as:  CYMBALTA  Take 60 mg by mouth daily.     insulin detemir 100 UNIT/ML injection  Commonly known as:  LEVEMIR  Inject 0.2 mLs (20 Units total) into the skin at bedtime.     insulin lispro 100 UNIT/ML injection  Commonly  known as:  HUMALOG  Inject 6 Units into the skin 3 (three) times daily before meals.     meloxicam 15 MG tablet  Commonly known as:  MOBIC  Take 15 mg by mouth daily as needed for pain.     oxyCODONE-acetaminophen 5-325 MG per tablet  Commonly known as:  ROXICET  Take 1 tablet by mouth every 6 (six) hours as needed for severe pain.     pantoprazole 40 MG tablet  Commonly known as:  PROTONIX  Take 1 tablet (40 mg total) by mouth daily.       Allergies  Allergen Reactions  . Ciprofloxacin Itching  . Hydrocodone-Acetaminophen Itching  . Penicillins Itching       Follow-up Information   Follow up with Colette Ribas, MD. Schedule an appointment as soon as possible for a visit in 2 weeks.   Specialty:  Family Medicine   Contact information:   [address] [place] [address] [phone]       Follow up with Dorisann Frames, MD. Schedule an appointment as soon as possible for a visit in 2 weeks.   Specialty:  Endocrinology   Contact information:   [address] [place] [address] [phone]        The results of significant diagnostics from this hospitalization (including imaging, microbiology, ancillary and laboratory) are listed below for reference.    Significant Diagnostic Studies: Dg Chest 2 View  04/29/2013   CLINICAL DATA:  Fever.  Shortness of breath.  EXAM: CHEST  2 VIEW  COMPARISON:  01/25/2010  FINDINGS: Heart size is within normal limits. Chronic central peribronchial thickening and interstitial prominence is unchanged. No evidence of acute or superimposed infiltrate. No evidence of pleural effusion. No mass or lymphadenopathy identified.  IMPRESSION: Stable exam. No active disease.   Electronically Signed   By: Myles Rosenthal M.D.   On: 04/29/2013 17:43   Ct Angio Chest Pe W/cm &/or Wo Cm  04/29/2013   CLINICAL DATA:  Right abdominal pain with fever and chills for 4 days. Recently diagnosed with chikungunya  disease.  EXAM: CT ANGIOGRAPHY CHEST  CT ABDOMEN AND PELVIS WITH CONTRAST  TECHNIQUE: Multidetector CT imaging of the chest was performed using the standard protocol during bolus administration of intravenous contrast. Multiplanar CT image reconstructions including MIPs were obtained to evaluate the vascular anatomy. Multidetector CT imaging of the abdomen and pelvis was performed using the standard protocol during bolus administration of intravenous contrast.  CONTRAST:  OMNIPAQUE IOHEXOL 350 MG/ML SOLN  COMPARISON:  None.  FINDINGS: CTA CHEST FINDINGS  There is satisfactory but not optimal opacification of the pulmonary arteries. No pulmonary emboli are demonstrated. There is mild atherosclerosis of the aorta and great vessels.  Small subcarinal and right hilar lymph nodes are noted, not pathologically enlarged. There is a small hiatal hernia. There is a small amount of pleural  thickening on the right. No significant pleural or pericardial effusion is present. There is a probable sebaceous cyst anterior to the lower sternum.  Mild pulmonary emphysematous changes are noted. There is mild atelectasis or scarring in the right lower lobe. No confluent airspace opacity or endobronchial lesion is present.  CT ABDOMEN and PELVIS FINDINGS  There is mild extrahepatic biliary dilatation status post cholecystectomy. The liver and pancreas appear normal. The spleen and adrenal glands appear normal. The right kidney has a horizontal lie, but no focal cortical abnormality. There is no hydronephrosis.  There is aortoiliac atherosclerosis. No enlarged abdominal pelvic lymph nodes are seen. The subacute small bowel appear normal aside from a lipoma within the 3rd portion of the duodenum. The appendix appears normal. Sigmoid colon diverticular changes are present without surrounding inflammation.  There is no pelvic mass status post hysterectomy. The bladder appears normal. There is a suprapubic ventral hernia containing fat.   Compression deformities at L3 and L4 are unchanged from a prior lumbar spine radiographs. No acute osseous findings are seen.  Review of the MIP images confirms the above findings.  IMPRESSION: 1. No evidence of acute pulmonary embolism. 2. No acute chest findings identified. There is mild right basilar scarring or atelectasis with pleural thickening. No significant pleural effusion. 3. No acute abdominal pelvic findings. 4. Suprapubic ventral hernia containing fat, atherosclerosis, chronic lumbar compression deformities and sigmoid colon diverticulosis are noted.   Electronically Signed   By: Roxy Horseman M.D.   On: 04/29/2013 17:40   Ct Abdomen Pelvis W Contrast  04/29/2013   CLINICAL DATA:  Right abdominal pain with fever and chills for 4 days. Recently diagnosed with chikungunya disease.  EXAM: CT ANGIOGRAPHY CHEST  CT ABDOMEN AND PELVIS WITH CONTRAST  TECHNIQUE: Multidetector CT imaging of the chest was performed using the standard protocol during bolus administration of intravenous contrast. Multiplanar CT image reconstructions including MIPs were obtained to evaluate the vascular anatomy. Multidetector CT imaging of the abdomen and pelvis was performed using the standard protocol during bolus administration of intravenous contrast.  CONTRAST:  OMNIPAQUE IOHEXOL 350 MG/ML SOLN  COMPARISON:  None.  FINDINGS: CTA CHEST FINDINGS  There is satisfactory but not optimal opacification of the pulmonary arteries. No pulmonary emboli are demonstrated. There is mild atherosclerosis of the aorta and great vessels.  Small subcarinal and right hilar lymph nodes are noted, not pathologically enlarged. There is a small hiatal hernia. There is a small amount of pleural thickening on the right. No significant pleural or pericardial effusion is present. There is a probable sebaceous cyst anterior to the lower sternum.  Mild pulmonary emphysematous changes are noted. There is mild atelectasis or scarring in the right  lower lobe. No confluent airspace opacity or endobronchial lesion is present.  CT ABDOMEN and PELVIS FINDINGS  There is mild extrahepatic biliary dilatation status post cholecystectomy. The liver and pancreas appear normal. The spleen and adrenal glands appear normal. The right kidney has a horizontal lie, but no focal cortical abnormality. There is no hydronephrosis.  There is aortoiliac atherosclerosis. No enlarged abdominal pelvic lymph nodes are seen. The subacute small bowel appear normal aside from a lipoma within the 3rd portion of the duodenum. The appendix appears normal. Sigmoid colon diverticular changes are present without surrounding inflammation.  There is no pelvic mass status post hysterectomy. The bladder appears normal. There is a suprapubic ventral hernia containing fat.  Compression deformities at L3 and L4 are unchanged from a prior lumbar spine radiographs.  No acute osseous findings are seen.  Review of the MIP images confirms the above findings.  IMPRESSION: 1. No evidence of acute pulmonary embolism. 2. No acute chest findings identified. There is mild right basilar scarring or atelectasis with pleural thickening. No significant pleural effusion. 3. No acute abdominal pelvic findings. 4. Suprapubic ventral hernia containing fat, atherosclerosis, chronic lumbar compression deformities and sigmoid colon diverticulosis are noted.   Electronically Signed   By: Roxy Horseman M.D.   On: 04/29/2013 17:40    Microbiology: Recent Results (from the past 240 hour(s))  MRSA PCR SCREENING     Status: None   Collection Time    04/30/13 12:20 AM      Result Value Range Status   MRSA by PCR NEGATIVE  NEGATIVE Final   Comment:            The GeneXpert MRSA Assay (FDA     approved for NASAL specimens     only), is one component of a     comprehensive MRSA colonization     surveillance program. It is not     intended to diagnose MRSA     infection nor to guide or     monitor treatment for      MRSA infections.     Labs: Basic Metabolic Panel:  Recent Labs Lab 04/29/13 1532 04/30/13 0503 05/01/13 0533 05/02/13 0545  NA 126* 137 133* 135  K 3.9 3.0* 4.0 4.3  CL 92* 105 103 106  CO2 20 19 16* 17*  GLUCOSE 671* 162* 317* 220*  BUN 11 14 21 12   CREATININE 0.96 1.18* 0.95 0.71  CALCIUM 9.3 8.4 8.4 9.0   Liver Function Tests:  Recent Labs Lab 04/29/13 1532 05/02/13 1251  AST 20 26  ALT 24 43*  ALKPHOS 141* 216*  BILITOT 0.6 0.6  PROT 6.8 5.9*  ALBUMIN 2.9* 2.1*    Recent Labs Lab 04/29/13 1532 05/02/13 1251  LIPASE 17 13   No results found for this basename: AMMONIA,  in the last 168 hours CBC:  Recent Labs Lab 04/29/13 1532 04/30/13 0503 05/02/13 1251  WBC 12.1* 13.1* 10.4  NEUTROABS 10.1*  --   --   HGB 13.1 11.1* 10.9*  HCT 38.4 32.9* 32.3*  MCV 90.4 89.2 89.7  PLT 157 152 189   Cardiac Enzymes: No results found for this basename: CKTOTAL, CKMB, CKMBINDEX, TROPONINI,  in the last 168 hours BNP: BNP (last 3 results) No results found for this basename: PROBNP,  in the last 8760 hours CBG:  Recent Labs Lab 05/01/13 1200 05/01/13 1655 05/01/13 2113 05/02/13 0735 05/02/13 1141  GLUCAP 279* 230* 202* 187* 187*       Signed:  Philippe Gang  [facility] 05/02/2013, 3:48 PM

## 2013-05-06 ENCOUNTER — Emergency Department (HOSPITAL_COMMUNITY): Payer: 59

## 2013-05-06 ENCOUNTER — Encounter (HOSPITAL_COMMUNITY): Payer: Self-pay | Admitting: Emergency Medicine

## 2013-05-06 ENCOUNTER — Inpatient Hospital Stay (HOSPITAL_COMMUNITY)
Admission: EM | Admit: 2013-05-06 | Discharge: 2013-05-18 | DRG: 163 | Disposition: A | Discharge status: Home / Self Care | Payer: 59 | Attending: Internal Medicine | Admitting: Internal Medicine

## 2013-05-06 DIAGNOSIS — M545 Low back pain, unspecified: Secondary | ICD-10-CM | POA: Diagnosis present

## 2013-05-06 DIAGNOSIS — R5381 Other malaise: Secondary | ICD-10-CM | POA: Diagnosis present

## 2013-05-06 DIAGNOSIS — R509 Fever, unspecified: Secondary | ICD-10-CM | POA: Diagnosis present

## 2013-05-06 DIAGNOSIS — Z79899 Other long term (current) drug therapy: Secondary | ICD-10-CM

## 2013-05-06 DIAGNOSIS — B955 Unspecified streptococcus as the cause of diseases classified elsewhere: Secondary | ICD-10-CM

## 2013-05-06 DIAGNOSIS — IMO0002 Reserved for concepts with insufficient information to code with codable children: Secondary | ICD-10-CM | POA: Diagnosis present

## 2013-05-06 DIAGNOSIS — I10 Essential (primary) hypertension: Secondary | ICD-10-CM | POA: Diagnosis present

## 2013-05-06 DIAGNOSIS — E109 Type 1 diabetes mellitus without complications: Secondary | ICD-10-CM | POA: Diagnosis present

## 2013-05-06 DIAGNOSIS — E876 Hypokalemia: Secondary | ICD-10-CM | POA: Diagnosis present

## 2013-05-06 DIAGNOSIS — J9600 Acute respiratory failure, unspecified whether with hypoxia or hypercapnia: Secondary | ICD-10-CM | POA: Diagnosis present

## 2013-05-06 DIAGNOSIS — F17200 Nicotine dependence, unspecified, uncomplicated: Secondary | ICD-10-CM | POA: Diagnosis present

## 2013-05-06 DIAGNOSIS — G8929 Other chronic pain: Secondary | ICD-10-CM | POA: Diagnosis present

## 2013-05-06 DIAGNOSIS — J9601 Acute respiratory failure with hypoxia: Secondary | ICD-10-CM

## 2013-05-06 DIAGNOSIS — F329 Major depressive disorder, single episode, unspecified: Secondary | ICD-10-CM | POA: Diagnosis present

## 2013-05-06 DIAGNOSIS — G92 Toxic encephalopathy: Secondary | ICD-10-CM | POA: Diagnosis present

## 2013-05-06 DIAGNOSIS — E119 Type 2 diabetes mellitus without complications: Secondary | ICD-10-CM

## 2013-05-06 DIAGNOSIS — E1110 Type 2 diabetes mellitus with ketoacidosis without coma: Secondary | ICD-10-CM

## 2013-05-06 DIAGNOSIS — D62 Acute posthemorrhagic anemia: Secondary | ICD-10-CM | POA: Diagnosis not present

## 2013-05-06 DIAGNOSIS — E871 Hypo-osmolality and hyponatremia: Secondary | ICD-10-CM | POA: Diagnosis present

## 2013-05-06 DIAGNOSIS — J90 Pleural effusion, not elsewhere classified: Secondary | ICD-10-CM | POA: Diagnosis present

## 2013-05-06 DIAGNOSIS — F3289 Other specified depressive episodes: Secondary | ICD-10-CM | POA: Diagnosis present

## 2013-05-06 DIAGNOSIS — B951 Streptococcus, group B, as the cause of diseases classified elsewhere: Secondary | ICD-10-CM | POA: Diagnosis present

## 2013-05-06 DIAGNOSIS — R1110 Vomiting, unspecified: Secondary | ICD-10-CM

## 2013-05-06 DIAGNOSIS — Z8679 Personal history of other diseases of the circulatory system: Secondary | ICD-10-CM

## 2013-05-06 DIAGNOSIS — D500 Iron deficiency anemia secondary to blood loss (chronic): Secondary | ICD-10-CM

## 2013-05-06 DIAGNOSIS — I959 Hypotension, unspecified: Secondary | ICD-10-CM

## 2013-05-06 DIAGNOSIS — G9340 Encephalopathy, unspecified: Secondary | ICD-10-CM

## 2013-05-06 DIAGNOSIS — R52 Pain, unspecified: Secondary | ICD-10-CM

## 2013-05-06 DIAGNOSIS — Z794 Long term (current) use of insulin: Secondary | ICD-10-CM

## 2013-05-06 DIAGNOSIS — E46 Unspecified protein-calorie malnutrition: Secondary | ICD-10-CM | POA: Diagnosis present

## 2013-05-06 DIAGNOSIS — J189 Pneumonia, unspecified organism: Principal | ICD-10-CM | POA: Diagnosis present

## 2013-05-06 DIAGNOSIS — IMO0001 Reserved for inherently not codable concepts without codable children: Secondary | ICD-10-CM

## 2013-05-06 DIAGNOSIS — E1165 Type 2 diabetes mellitus with hyperglycemia: Secondary | ICD-10-CM

## 2013-05-06 DIAGNOSIS — R739 Hyperglycemia, unspecified: Secondary | ICD-10-CM

## 2013-05-06 DIAGNOSIS — G929 Unspecified toxic encephalopathy: Secondary | ICD-10-CM | POA: Diagnosis present

## 2013-05-06 DIAGNOSIS — J869 Pyothorax without fistula: Secondary | ICD-10-CM

## 2013-05-06 DIAGNOSIS — M25569 Pain in unspecified knee: Secondary | ICD-10-CM | POA: Diagnosis present

## 2013-05-06 LAB — COMPREHENSIVE METABOLIC PANEL
ALT: 15 U/L (ref 0–35)
AST: 15 U/L (ref 0–37)
Albumin: 1.5 g/dL — ABNORMAL LOW (ref 3.5–5.2)
Alkaline Phosphatase: 211 U/L — ABNORMAL HIGH (ref 39–117)
BUN: 16 mg/dL (ref 6–23)
CO2: 20 mEq/L (ref 19–32)
Calcium: 8.7 mg/dL (ref 8.4–10.5)
Chloride: 99 mEq/L (ref 96–112)
Creatinine, Ser: 0.91 mg/dL (ref 0.50–1.10)
GFR calc Af Amer: 80 mL/min — ABNORMAL LOW (ref >90)
GFR calc non Af Amer: 69 mL/min — ABNORMAL LOW (ref >90)
Glucose, Bld: 322 mg/dL — ABNORMAL HIGH (ref 70–99)
Potassium: 2.3 mEq/L — CL (ref 3.5–5.1)
Sodium: 131 mEq/L — ABNORMAL LOW (ref 135–145)
Total Bilirubin: 0.6 mg/dL (ref 0.3–1.2)
Total Protein: 5.6 g/dL — ABNORMAL LOW (ref 6.0–8.3)

## 2013-05-06 LAB — CBC WITH DIFFERENTIAL/PLATELET
Band Neutrophils: 0 % (ref 0–10)
Basophils Absolute: 0 10*3/uL (ref 0.0–0.1)
Basophils Relative: 0 % (ref 0–1)
Blasts: 0 %
Eosinophils Absolute: 0.1 10*3/uL (ref 0.0–0.7)
Eosinophils Relative: 1 % (ref 0–5)
HCT: 31.9 % — ABNORMAL LOW (ref 36.0–46.0)
Hemoglobin: 10.8 g/dL — ABNORMAL LOW (ref 12.0–15.0)
Lymphocytes Relative: 5 % — ABNORMAL LOW (ref 12–46)
Lymphs Abs: 0.7 10*3/uL (ref 0.7–4.0)
MCH: 29.5 pg (ref 26.0–34.0)
MCHC: 33.9 g/dL (ref 30.0–36.0)
MCV: 87.2 fL (ref 78.0–100.0)
Metamyelocytes Relative: 0 %
Monocytes Absolute: 0 10*3/uL — ABNORMAL LOW (ref 0.1–1.0)
Monocytes Relative: 0 % — ABNORMAL LOW (ref 3–12)
Myelocytes: 0 %
Neutro Abs: 12.7 10*3/uL — ABNORMAL HIGH (ref 1.7–7.7)
Neutrophils Relative %: 94 % — ABNORMAL HIGH (ref 43–77)
Platelets: 192 10*3/uL (ref 150–400)
Promyelocytes Absolute: 0 %
RBC: 3.66 MIL/uL — ABNORMAL LOW (ref 3.87–5.11)
RDW: 13.8 % (ref 11.5–15.5)
WBC: 13.5 10*3/uL — ABNORMAL HIGH (ref 4.0–10.5)
nRBC: 0 /100 WBC

## 2013-05-06 LAB — MAGNESIUM: Magnesium: 1.4 mg/dL — ABNORMAL LOW (ref 1.5–2.5)

## 2013-05-06 LAB — GLUCOSE, CAPILLARY
Glucose-Capillary: 233 mg/dL — ABNORMAL HIGH (ref 70–99)
Glucose-Capillary: 237 mg/dL — ABNORMAL HIGH (ref 70–99)

## 2013-05-06 LAB — LACTIC ACID, PLASMA: Lactic Acid, Venous: 1.8 mmol/L (ref 0.5–2.2)

## 2013-05-06 LAB — LIPASE, BLOOD: Lipase: 10 U/L — ABNORMAL LOW (ref 11–59)

## 2013-05-06 MED ORDER — POTASSIUM CHLORIDE 10 MEQ/100ML IV SOLN
10.0000 meq | INTRAVENOUS | Status: AC
  Administered 2013-05-07 (×4): 10 meq via INTRAVENOUS
  Filled 2013-05-06 (×4): qty 100

## 2013-05-06 MED ORDER — METRONIDAZOLE IN NACL 5-0.79 MG/ML-% IV SOLN
INTRAVENOUS | Status: AC
  Filled 2013-05-06: qty 200

## 2013-05-06 MED ORDER — DEXTROSE 5 % IV SOLN
1.0000 g | Freq: Three times a day (TID) | INTRAVENOUS | Status: DC
  Administered 2013-05-06 – 2013-05-10 (×11): 1 g via INTRAVENOUS
  Filled 2013-05-06 (×15): qty 1

## 2013-05-06 MED ORDER — ONDANSETRON HCL 4 MG/2ML IJ SOLN
4.0000 mg | Freq: Four times a day (QID) | INTRAMUSCULAR | Status: DC | PRN
  Administered 2013-05-08 – 2013-05-09 (×2): 4 mg via INTRAVENOUS
  Filled 2013-05-06 (×2): qty 2

## 2013-05-06 MED ORDER — DEXTROSE 5 % IV SOLN
1.0000 g | Freq: Once | INTRAVENOUS | Status: AC
  Administered 2013-05-06: 1 g via INTRAVENOUS

## 2013-05-06 MED ORDER — METRONIDAZOLE IN NACL 5-0.79 MG/ML-% IV SOLN: 500.0000 mg | Freq: Once | INTRAVENOUS | Status: DC

## 2013-05-06 MED ORDER — DEXTROSE 5 % IV SOLN
INTRAVENOUS | Status: AC
  Filled 2013-05-06 (×2): qty 1

## 2013-05-06 MED ORDER — MORPHINE SULFATE 2 MG/ML IJ SOLN
2.0000 mg | INTRAMUSCULAR | Status: DC | PRN
  Administered 2013-05-07 – 2013-05-08 (×6): 2 mg via INTRAVENOUS
  Filled 2013-05-06 (×6): qty 1

## 2013-05-06 MED ORDER — ONDANSETRON HCL 4 MG PO TABS: 4.0000 mg | ORAL_TABLET | Freq: Four times a day (QID) | ORAL | Status: DC | PRN

## 2013-05-06 MED ORDER — ACETAMINOPHEN 650 MG RE SUPP: 650.0000 mg | Freq: Four times a day (QID) | RECTAL | Status: DC | PRN

## 2013-05-06 MED ORDER — LEVALBUTEROL HCL 0.63 MG/3ML IN NEBU
0.6300 mg | INHALATION_SOLUTION | Freq: Four times a day (QID) | RESPIRATORY_TRACT | Status: DC
  Administered 2013-05-07 – 2013-05-09 (×9): 0.63 mg via RESPIRATORY_TRACT
  Filled 2013-05-06 (×13): qty 3

## 2013-05-06 MED ORDER — METRONIDAZOLE IN NACL 5-0.79 MG/ML-% IV SOLN
500.0000 mg | Freq: Three times a day (TID) | INTRAVENOUS | Status: DC
  Administered 2013-05-06 – 2013-05-11 (×13): 500 mg via INTRAVENOUS
  Filled 2013-05-06 (×15): qty 100

## 2013-05-06 MED ORDER — LEVOFLOXACIN IN D5W 750 MG/150ML IV SOLN: 750.0000 mg | INTRAVENOUS | Status: DC

## 2013-05-06 MED ORDER — INSULIN ASPART 100 UNIT/ML ~~LOC~~ SOLN
0.0000 [IU] | Freq: Every day | SUBCUTANEOUS | Status: DC
  Administered 2013-05-06: 5 [IU] via SUBCUTANEOUS
  Administered 2013-05-09: 2 [IU] via SUBCUTANEOUS

## 2013-05-06 MED ORDER — SODIUM CHLORIDE 0.9 % IJ SOLN
3.0000 mL | Freq: Two times a day (BID) | INTRAMUSCULAR | Status: DC
  Administered 2013-05-08 (×2): 3 mL via INTRAVENOUS
  Administered 2013-05-09: 23:00:00 via INTRAVENOUS
  Administered 2013-05-10 – 2013-05-13 (×8): 3 mL via INTRAVENOUS
  Administered 2013-05-14: 10 mL via INTRAVENOUS

## 2013-05-06 MED ORDER — POTASSIUM CHLORIDE IN NACL 20-0.9 MEQ/L-% IV SOLN
INTRAVENOUS | Status: DC
  Administered 2013-05-06 – 2013-05-08 (×3): via INTRAVENOUS
  Filled 2013-05-06 (×4): qty 1000

## 2013-05-06 MED ORDER — DULOXETINE HCL 60 MG PO CPEP
60.0000 mg | ORAL_CAPSULE | Freq: Every day | ORAL | Status: DC
  Administered 2013-05-07 – 2013-05-10 (×3): 60 mg via ORAL
  Filled 2013-05-06 (×4): qty 1

## 2013-05-06 MED ORDER — POTASSIUM CHLORIDE 10 MEQ/100ML IV SOLN
10.0000 meq | Freq: Once | INTRAVENOUS | Status: AC
  Administered 2013-05-06: 10 meq via INTRAVENOUS
  Filled 2013-05-06: qty 100

## 2013-05-06 MED ORDER — POTASSIUM CHLORIDE 10 MEQ/100ML IV SOLN
INTRAVENOUS | Status: AC
  Filled 2013-05-06: qty 100

## 2013-05-06 MED ORDER — VANCOMYCIN HCL IN DEXTROSE 1-5 GM/200ML-% IV SOLN
INTRAVENOUS | Status: AC
  Filled 2013-05-06: qty 200

## 2013-05-06 MED ORDER — MORPHINE SULFATE 2 MG/ML IJ SOLN
2.0000 mg | Freq: Once | INTRAMUSCULAR | Status: AC
  Administered 2013-05-06: 2 mg via INTRAVENOUS
  Filled 2013-05-06: qty 1

## 2013-05-06 MED ORDER — INSULIN DETEMIR 100 UNIT/ML ~~LOC~~ SOLN
20.0000 [IU] | Freq: Every day | SUBCUTANEOUS | Status: DC
  Administered 2013-05-07: 20 [IU] via SUBCUTANEOUS
  Filled 2013-05-06 (×2): qty 0.2

## 2013-05-06 MED ORDER — POTASSIUM CHLORIDE 10 MEQ/100ML IV SOLN
10.0000 meq | Freq: Once | INTRAVENOUS | Status: AC
  Administered 2013-05-06: 10 meq via INTRAVENOUS

## 2013-05-06 MED ORDER — SODIUM CHLORIDE 0.9 % IV BOLUS (SEPSIS)
1000.0000 mL | Freq: Once | INTRAVENOUS | Status: AC
  Administered 2013-05-06: 1000 mL via INTRAVENOUS

## 2013-05-06 MED ORDER — DIAZEPAM 5 MG PO TABS
5.0000 mg | ORAL_TABLET | Freq: Three times a day (TID) | ORAL | Status: DC | PRN
  Administered 2013-05-07 – 2013-05-16 (×4): 5 mg via ORAL
  Filled 2013-05-06 (×4): qty 1

## 2013-05-06 MED ORDER — INSULIN ASPART 100 UNIT/ML ~~LOC~~ SOLN
0.0000 [IU] | Freq: Three times a day (TID) | SUBCUTANEOUS | Status: DC
  Administered 2013-05-07 (×2): 5 [IU] via SUBCUTANEOUS
  Administered 2013-05-07: 8 [IU] via SUBCUTANEOUS
  Administered 2013-05-08: 3 [IU] via SUBCUTANEOUS
  Administered 2013-05-08: 5 [IU] via SUBCUTANEOUS
  Administered 2013-05-09: 8 [IU] via SUBCUTANEOUS
  Administered 2013-05-09: 11 [IU] via SUBCUTANEOUS
  Administered 2013-05-10: 8 [IU] via SUBCUTANEOUS
  Administered 2013-05-10 (×2): 5 [IU] via SUBCUTANEOUS
  Administered 2013-05-11: 3 [IU] via SUBCUTANEOUS

## 2013-05-06 MED ORDER — ACETAMINOPHEN 325 MG PO TABS
650.0000 mg | ORAL_TABLET | Freq: Four times a day (QID) | ORAL | Status: DC | PRN
  Administered 2013-05-07 – 2013-05-13 (×4): 650 mg via ORAL
  Filled 2013-05-06 (×5): qty 2

## 2013-05-06 MED ORDER — VANCOMYCIN HCL IN DEXTROSE 1-5 GM/200ML-% IV SOLN
1000.0000 mg | Freq: Once | INTRAVENOUS | Status: AC
  Administered 2013-05-06: 1000 mg via INTRAVENOUS

## 2013-05-06 NOTE — ED Notes (Signed)
Hospitalist at bedside 

## 2013-05-06 NOTE — ED Notes (Signed)
Pt here for chronic pain worse since getting back from [place], which was the 20th. CBG of 382 en route. Pt states she is tired. Pt took two valium 10mg  today, Rx for only once a day.

## 2013-05-06 NOTE — H&P (Signed)
[facility] History and Physical  Kelli Summers WUJ:[id number] DOB: 11-Nov-1955 DOA: 05/06/2013   PCP: Colette Ribas, MD  Specialists: She is followed by Dr. Talmage Nap, Endocrinology  Chief Complaint: Nausea, vomiting, fever, shortness of breath  HPI: Kelli Summers is a 57 y.o. female with a past medical history of, diabetes, gastroparesis, who was recently hospitalized for hyperglycemia. She recently returned from [place] on October 29 and was experiencing fever and body aches. There was a concern about Chikungunya virus. However, she was noted to be afebrile in the hospital. She was discharged after her blood sugars were adequately controlled. She was doing well on her discharge day. Then on this past Thursday she started feeling worse again. Started having body aches, joint pains and headaches. And, then she had a temperature of 101F. She's been short of breath and has had chest pain, especially when lying on the right side. However, she denies any cough. She's had about 4 episodes of nausea and vomiting. In the last few days. Denies any blood in the emesis. She's had abdominal pain in the lower part of the abdomen, which is 6/10 in intensity, dull pain without any radiation. Noticed getting aggravating or relieving factors. Denies any diarrhea. Last bowel movement was yesterday. She avoided coming to the hospital for the last many days, but then today she felt very weak and decided to come in. She's had poor oral intake for the last few days as well.  Home Medications: Prior to Admission medications   Medication Sig Start Date End Date Taking? Authorizing Provider  diazepam (VALIUM) 10 MG tablet Take 10 mg by mouth daily as needed for anxiety.    Yes Historical Provider, MD  insulin detemir (LEVEMIR) 100 UNIT/ML injection Inject 0.2 mLs (20 Units total) into the skin at bedtime. 05/02/13  Yes Erick Blinks, MD  insulin lispro (HUMALOG KWIKPEN) 100 UNIT/ML SOPN Inject 6-10 Units into the  skin 3 (three) times daily with meals.   Yes Historical Provider, MD  pantoprazole (PROTONIX) 40 MG tablet Take 1 tablet (40 mg total) by mouth daily. 05/02/13  Yes Erick Blinks, MD  DULoxetine (CYMBALTA) 60 MG capsule Take 60 mg by mouth daily.    Historical Provider, MD  oxyCODONE-acetaminophen (ROXICET) 5-325 MG per tablet Take 1 tablet by mouth every 6 (six) hours as needed for severe pain. 05/02/13   Erick Blinks, MD    Allergies:  Allergies  Allergen Reactions  . Ciprofloxacin Itching  . Hydrocodone-Acetaminophen Itching  . Penicillins Itching  . Percocet [Oxycodone-Acetaminophen] Itching    Past Medical History: Past Medical History  Diagnosis Date  . Diabetes mellitus   . History of stress test 01/2010    normal myoview stress test  . Chronic back pain   . Depression   . Chronic knee pain   . Hypertension     Past Surgical History  Procedure Laterality Date  . Back surgery    . Cholecystectomy    . Abdominal hysterectomy      Social History: She lives in [place]. She denies cigarette smoking, but she has smoking e-cigarettes. No alcohol use. No illicit drug use.  Family History:  she tells me that there is diabetes in the family  Review of Systems - History obtained from the patient General ROS: positive for  - chills, fatigue and fever Psychological ROS: negative Ophthalmic ROS: negative ENT ROS: negative Allergy and Immunology ROS: negative Hematological and Lymphatic ROS: negative Endocrine ROS: negative Respiratory ROS: as in hpi Cardiovascular  ROS: as in hpi Gastrointestinal ROS: as in hpi Genito-Urinary ROS: no dysuria, trouble voiding, or hematuria Musculoskeletal ROS: positive for generalized body aches and back pain Neurological ROS: no TIA or stroke symptoms Dermatological ROS: negative  Physical Examination  Filed Vitals:   05/06/13 1551 05/06/13 1739 05/06/13 1751 05/06/13 1945  BP: 101/59 122/67 118/58 120/60  Pulse: 131 118 120 129   Temp: 98.6 F (37 C)     TempSrc: Oral     Resp: 16 20 22 24   Height: 5\' 5"  (1.651 m)     Weight: 81.647 kg (180 lb)     SpO2: 94% 94% 92%     General appearance: alert, cooperative, appears stated age, fatigued, no distress and moderately obese Head: Normocephalic, without obvious abnormality, atraumatic Eyes: conjunctivae/corneas clear. PERRL, EOM's intact. Throat: Dry mucous membranes. Neck: no adenopathy, no carotid bruit, no JVD, supple, symmetrical, trachea midline and thyroid not enlarged, symmetric, no tenderness/mass/nodules Back: Is a scar noted in the lower back over the lumbar area. There is some tenderness in the midline. Resp: Crackles noted. Right base with dullness to percussion. Rhonchi present as well. Cardio: S1-S2 is tachycardic. Regular. No S3, S4. No rubs, murmurs, or bruits. GI: soft, non-tender; bowel sounds normal; no masses,  no organomegaly Extremities: extremities normal, atraumatic, no cyanosis or edema Pulses: 2+ and symmetric Skin: Skin color, texture, turgor normal. No rashes or lesions Lymph nodes: Cervical, supraclavicular, and axillary nodes normal. Neurologic: She is alert and oriented x3. Cranial nerves are intact. Motor strength is equal upper extremities, as well as lower extremity. However, she does have generalized weakness. She had good strength in the ankle flexors bilaterally, but had difficulty raising the legs off the bed.  Laboratory Data: Results for orders placed during the hospital encounter of 05/06/13 (from the past 48 hour(s))  CBC WITH DIFFERENTIAL     Status: Abnormal   Collection Time    05/06/13  4:49 PM      Result Value Range   WBC 13.5 (*) 4.0 - 10.5 K/uL   RBC 3.66 (*) 3.87 - 5.11 MIL/uL   Hemoglobin 10.8 (*) 12.0 - 15.0 g/dL   HCT 16.1 (*) 09.6 - 04.5 %   MCV 87.2  78.0 - 100.0 fL   MCH 29.5  26.0 - 34.0 pg   MCHC 33.9  30.0 - 36.0 g/dL   RDW 40.9  81.1 - 91.4 %   Platelets 192  150 - 400 K/uL   Neutrophils  Relative % 94 (*) 43 - 77 %   Lymphocytes Relative 5 (*) 12 - 46 %   Monocytes Relative 0 (*) 3 - 12 %   Eosinophils Relative 1  0 - 5 %   Basophils Relative 0  0 - 1 %   Band Neutrophils 0  0 - 10 %   Metamyelocytes Relative 0     Myelocytes 0     Promyelocytes Absolute 0     Blasts 0     nRBC 0  0 /100 WBC   Neutro Abs 12.7 (*) 1.7 - 7.7 K/uL   Lymphs Abs 0.7  0.7 - 4.0 K/uL   Monocytes Absolute 0.0 (*) 0.1 - 1.0 K/uL   Eosinophils Absolute 0.1  0.0 - 0.7 K/uL   Basophils Absolute 0.0  0.0 - 0.1 K/uL  COMPREHENSIVE METABOLIC PANEL     Status: Abnormal   Collection Time    05/06/13  4:49 PM      Result Value Range  Sodium 131 (*) 135 - 145 mEq/L   Potassium 2.3 (*) 3.5 - 5.1 mEq/L   Comment: CRITICAL RESULT CALLED TO, READ BACK BY AND VERIFIED WITH:     KEITH, J @ 1722 ON 05/06/13 BY GODFREY,O   Chloride 99  96 - 112 mEq/L   CO2 20  19 - 32 mEq/L   Glucose, Bld 322 (*) 70 - 99 mg/dL   BUN 16  6 - 23 mg/dL   Creatinine, Ser 1.61  0.50 - 1.10 mg/dL   Calcium 8.7  8.4 - 09.6 mg/dL   Total Protein 5.6 (*) 6.0 - 8.3 g/dL   Albumin 1.5 (*) 3.5 - 5.2 g/dL   AST 15  0 - 37 U/L   ALT 15  0 - 35 U/L   Alkaline Phosphatase 211 (*) 39 - 117 U/L   Total Bilirubin 0.6  0.3 - 1.2 mg/dL   GFR calc non Af Amer 69 (*) >90 mL/min   GFR calc Af Amer 80 (*) >90 mL/min   Comment: (NOTE)     The eGFR has been calculated using the CKD EPI equation.     This calculation has not been validated in all clinical situations.     eGFR's persistently <90 mL/min signify possible Chronic Kidney     Disease.  GLUCOSE, CAPILLARY     Status: Abnormal   Collection Time    05/06/13  6:00 PM      Result Value Range   Glucose-Capillary 233 (*) 70 - 99 mg/dL    Radiology Reports: Dg Abd Acute W/chest  05/06/2013   CLINICAL DATA:  Chronic abdominal pain.  EXAM: ACUTE ABDOMEN SERIES (ABDOMEN 2 VIEW & CHEST 1 VIEW)  COMPARISON:  04/29/2013.  FINDINGS: The cardiac silhouette, mediastinal and hilar contours  are within normal limits and stable There is a new probable loculated pleural fluid collection on the right and a right lung infiltrate. This is unlikely a mass given the recent normal Chest x-ray and chest CT. The left lung is clear. The bony thorax is intact.  The abdominal radiographs demonstrate an unremarkable bowel gas pattern. There is scattered contrast throughout the colon. The soft tissue shadows are maintained. The bony structures are intact. Stable vertebral augmentation changes at L3 and L4 new large probable loculated right pleural effusion and right lung infiltrate.  IMPRESSION: New probable loculated right pleural fluid collection and right lung infiltrate.  No acute abdominal findings. Contrast noted throughout the colon.   Electronically Signed   By: Loralie Champagne M.D.   On: 05/06/2013 19:46    Electrocardiogram: Sinus tachycardia at 122 beats per minute. PVCs are noted. Normal axis. Intervals appear to be normal. No Q waves. No concerning ST or T-wave changes are noted.  Problem List  Principal Problem:   HCAP (healthcare-associated pneumonia) Active Problems:   Body aches   Diabetes mellitus type 2, uncontrolled   Pleural effusion, right   Fever   Hypokalemia   Assessment: This is a 57 year old, Caucasian female, who presents with a 3 to 4-day history of generalized body aches, shortness of breath, nausea, and vomiting. She is noted have a possibly loculated pleural effusion on the right side, along with a lung infiltrate. This is most likely healthcare associated pneumonia with parapneumonic effusion. She's also extremely dehydrated, with hyperglycemia. No evidence for DKA.  Plan: #1 Health care associated pneumoniae with parapneumonic effusion: She'll be treated with broad-spectrum antibiotics with vancomycin, cefepime, and metronidazole. Have discussed this with the Dr. De Burrs, pulmonology  at [place]. We have reviewed the chest x-ray and he recommends an  ultrasound-guided thoracentesis in the morning. And we will go ahead and consult our pulmonologist tomorrow. Oxygen as needed.  #2 body aches, back pain, joint aches: Possibly due to acute illness. We will get a lumbar x-ray. She was having back pain during previous hospitalization and that had improved with the pain medications. She does have some difficulty lifting her legs off the bed, but had good strength in her ankle flexors. If her back pain does not improve, further imaging studies may be necessary.  #3 diabetes type 2, uncontrolled: Initiate Levemir tonight. Place on a sliding scale coverage. HbA1c was 14.9 on November 3. She will require further titration of her Levemir.  #4 lower abdominal pain: Abdominal x-ray did not show any concerning findings in the abdomen. Her examination is benign. We will check a UA.  #5 hypokalemia: This will be repleted intravenously. Magnesium level will be checked.  #6 nausea and vomiting: Most likely due to acute illness. Diabetic gastroparesis as a possibility. Gastric emptying study to be considered when the patient is more stable. Consider Reglan if she continues to have vomiting.  DVT Prophylaxis: SCDs for now. Initiate the Lovenox after thoracentesis Code Status: Full code Family Communication: Discussed with the patient and her husband  Disposition Plan: Admit the step down   Further management decisions will depend on results of further testing and patient's response to treatment.  [facility]  [facility] Pager [phone]  If 7PM-7AM, please contact night-coverage [email or web address] Password [facility]  05/06/2013, 8:51 PM

## 2013-05-06 NOTE — ED Provider Notes (Signed)
CSN: [id number]     Arrival date & time 05/06/13  1549 History   First MD Initiated Contact with Patient 05/06/13 1625    Scribed for Benny Lennert, MD, the patient was seen in room APA08/APA08. This chart was scribed by Lewanda Rife, ED scribe. Patient's care was started at 4:28 PM  Chief Complaint  Patient presents with  . Hyperglycemia   (Consider location/radiation/quality/duration/timing/severity/associated sxs/prior Treatment) Patient is a 57 y.o. female presenting with weakness. The history is provided by the patient. No language interpreter was used.  Weakness This is a new problem. The current episode started more than 1 week ago. The problem occurs constantly. The problem has been gradually worsening. Pertinent negatives include no headaches. Nothing aggravates the symptoms. Nothing relieves the symptoms. She has tried nothing for the symptoms.   HPI Comments: Kelli Summers is a 57 y.o. female brought in by ambulance, who presents to the Emergency Department with PMHx of chronic low back pain complaining of gradually worsening weakness onset 5 days. Reports associated emesis, decreased appetite, nausea, dehydration, arthralgias of bilateral knees, and low back pain. Denies any aggravating or alleviating factors. Denies associated fever.   Reports she returned from [place] 04/16/13. Reports several f/u appointments with PCP.    Past Medical History  Diagnosis Date  . Diabetes mellitus   . History of stress test 01/2010    normal myoview stress test  . Chronic back pain   . Depression   . Chronic knee pain   . Hypertension    Past Surgical History  Procedure Laterality Date  . Back surgery    . Cholecystectomy    . Abdominal hysterectomy     No family history on file. History  Substance Use Topics  . Smoking status: Current Every Day Smoker -- 0.30 packs/day    Types: Cigarettes  . Smokeless tobacco: Current User  . Alcohol Use: Yes   OB History   Grav Para  Term Preterm Abortions TAB SAB Ect Mult Living                 Review of Systems  Constitutional: Negative for appetite change.  HENT: Negative for congestion, ear discharge and sinus pressure.   Eyes: Negative for discharge.  Respiratory: Negative for cough.   Gastrointestinal: Negative for diarrhea.  Genitourinary: Negative for frequency and hematuria.  Musculoskeletal: Negative for back pain.  Skin: Negative for rash.  Neurological: Positive for weakness. Negative for seizures and headaches.  Psychiatric/Behavioral: Negative for hallucinations.    Allergies  Ciprofloxacin; Hydrocodone-acetaminophen; Penicillins; and Percocet  Home Medications   Current Outpatient Rx  Name  Route  Sig  Dispense  Refill  . diazepam (VALIUM) 10 MG tablet   Oral   Take 10 mg by mouth daily as needed for anxiety.          . insulin detemir (LEVEMIR) 100 UNIT/ML injection   Subcutaneous   Inject 0.2 mLs (20 Units total) into the skin at bedtime.   10 mL   12   . insulin lispro (HUMALOG KWIKPEN) 100 UNIT/ML SOPN   Subcutaneous   Inject 6-10 Units into the skin 3 (three) times daily with meals.         . pantoprazole (PROTONIX) 40 MG tablet   Oral   Take 1 tablet (40 mg total) by mouth daily.   30 tablet   1   . DULoxetine (CYMBALTA) 60 MG capsule   Oral   Take 60 mg by mouth daily.         Marland Kitchen  oxyCODONE-acetaminophen (ROXICET) 5-325 MG per tablet   Oral   Take 1 tablet by mouth every 6 (six) hours as needed for severe pain.   15 tablet   0    BP 101/59  Pulse 131  Temp(Src) 98.6 F (37 C) (Oral)  Resp 16  Ht 5\' 5"  (1.651 m)  Wt 180 lb (81.647 kg)  BMI 29.95 kg/m2  SpO2 94% Physical Exam  Nursing note and vitals reviewed. Constitutional: She is oriented to person, place, and time. She appears well-developed and well-nourished. No distress.  HENT:  Head: Normocephalic and atraumatic.  Mouth/Throat: Mucous membranes are dry.  Eyes: Conjunctivae and EOM are normal. No  scleral icterus.  Neck: Neck supple. No tracheal deviation present. No thyromegaly present.  Cardiovascular: Normal rate and regular rhythm.  Exam reveals no gallop and no friction rub.   No murmur heard. Pulmonary/Chest: Effort normal. No stridor. No respiratory distress. She has no wheezes. She has no rales. She exhibits no tenderness.  Abdominal: She exhibits no distension. There is tenderness. There is no rebound.  Mild TTP of right abdomen   Musculoskeletal: Normal range of motion. She exhibits no edema.  Lymphadenopathy:    She has no cervical adenopathy.  Neurological: She is alert and oriented to person, place, and time. She exhibits normal muscle tone. Coordination normal.  Skin: Skin is warm and dry. No rash noted. No erythema.  Psychiatric: She has a normal mood and affect. Her behavior is normal.    ED Course  Procedures (including critical care time)  4:20 PM Per EMS CBG was 382  COORDINATION OF CARE:  Nursing notes reviewed. Vital signs reviewed. Initial pt interview and examination performed.   4:28 PM-Discussed work up plan with pt at bedside, which includes CBC with diff panel,  EKG, and CMP. Pt agrees with plan.  6:16 PM Nursing Notes Reviewed/ Care Coordinated Interpretation of Laboratory Data incorporated into ED treatment Discussed results and treatment plan with pt. Pt demonstrates understanding and agrees with plan of possible hospital admission. Adding on x-ray of abdomen, and CBG.   8:05 PM Consulted with [facility] for admission.   8:10 PM Nursing Notes Reviewed/ Care Coordinated Applicable Imaging Reviewed and incorporated into ED treatment Discussed results and treatment plan with pt. Pt demonstrates understanding and agrees with plan.   Treatment plan initiated: Medications  potassium chloride 10 mEq in 100 mL IVPB (10 mEq Intravenous New Bag/Given 05/06/13 1915)  sodium chloride 0.9 % bolus 1,000 mL (0 mLs Intravenous Stopped 05/06/13 1755)   potassium chloride 10 mEq in 100 mL IVPB (0 mEq Intravenous Stopped 05/06/13 1908)  potassium chloride 10 MEQ/100ML IVPB (  Duplicate 05/06/13 1919)     Initial diagnostic testing ordered.    Labs Review Labs Reviewed  CBC WITH DIFFERENTIAL - Abnormal; Notable for the following:    WBC 13.5 (*)    RBC 3.66 (*)    Hemoglobin 10.8 (*)    HCT 31.9 (*)    Neutrophils Relative % 94 (*)    Lymphocytes Relative 5 (*)    Monocytes Relative 0 (*)    Neutro Abs 12.7 (*)    Monocytes Absolute 0.0 (*)    All other components within normal limits  COMPREHENSIVE METABOLIC PANEL - Abnormal; Notable for the following:    Sodium 131 (*)    Potassium 2.3 (*)    Glucose, Bld 322 (*)    Total Protein 5.6 (*)    Albumin 1.5 (*)    Alkaline Phosphatase  211 (*)    GFR calc non Af Amer 69 (*)    GFR calc Af Amer 80 (*)    All other components within normal limits  GLUCOSE, CAPILLARY - Abnormal; Notable for the following:    Glucose-Capillary 233 (*)    All other components within normal limits  LACTIC ACID, PLASMA   Imaging Review Dg Abd Acute W/chest  05/06/2013   CLINICAL DATA:  Chronic abdominal pain.  EXAM: ACUTE ABDOMEN SERIES (ABDOMEN 2 VIEW & CHEST 1 VIEW)  COMPARISON:  04/29/2013.  FINDINGS: The cardiac silhouette, mediastinal and hilar contours are within normal limits and stable There is a new probable loculated pleural fluid collection on the right and a right lung infiltrate. This is unlikely a mass given the recent normal Chest x-ray and chest CT. The left lung is clear. The bony thorax is intact.  The abdominal radiographs demonstrate an unremarkable bowel gas pattern. There is scattered contrast throughout the colon. The soft tissue shadows are maintained. The bony structures are intact. Stable vertebral augmentation changes at L3 and L4 new large probable loculated right pleural effusion and right lung infiltrate.  IMPRESSION: New probable loculated right pleural fluid collection and  right lung infiltrate.  No acute abdominal findings. Contrast noted throughout the colon.   Electronically Signed   By: Loralie Champagne M.D.   On: 05/06/2013 19:46    EKG Interpretation     Ventricular Rate:  122 PR Interval:  154 QRS Duration: 84 QT Interval:  346 QTC Calculation: 493 R Axis:   72 Text Interpretation:  Sinus tachycardia with frequent Premature ventricular complexes Otherwise normal ECG            MDM  No diagnosis found.   The chart was scribed for me under my direct supervision.  I personally performed the history, physical, and medical decision making and all procedures in the evaluation of this patient.Benny Lennert, MD 05/06/13 2013

## 2013-05-07 ENCOUNTER — Inpatient Hospital Stay (HOSPITAL_COMMUNITY): Payer: 59

## 2013-05-07 ENCOUNTER — Encounter (HOSPITAL_COMMUNITY): Payer: Self-pay

## 2013-05-07 DIAGNOSIS — R1110 Vomiting, unspecified: Secondary | ICD-10-CM

## 2013-05-07 LAB — URINALYSIS, ROUTINE W REFLEX MICROSCOPIC
Bilirubin Urine: NEGATIVE
Glucose, UA: 250 mg/dL — AB
Ketones, ur: 15 mg/dL — AB
Nitrite: POSITIVE — AB
Protein, ur: 100 mg/dL — AB
Specific Gravity, Urine: 1.025 (ref 1.005–1.030)
Urobilinogen, UA: 1 mg/dL (ref 0.0–1.0)
pH: 6 (ref 5.0–8.0)

## 2013-05-07 LAB — COMPREHENSIVE METABOLIC PANEL
ALT: 16 U/L (ref 0–35)
AST: 18 U/L (ref 0–37)
Albumin: 1.5 g/dL — ABNORMAL LOW (ref 3.5–5.2)
Alkaline Phosphatase: 215 U/L — ABNORMAL HIGH (ref 39–117)
BUN: 16 mg/dL (ref 6–23)
CO2: 20 mEq/L (ref 19–32)
Calcium: 8.4 mg/dL (ref 8.4–10.5)
Chloride: 102 mEq/L (ref 96–112)
Creatinine, Ser: 1.05 mg/dL (ref 0.50–1.10)
GFR calc Af Amer: 67 mL/min — ABNORMAL LOW (ref >90)
GFR calc non Af Amer: 58 mL/min — ABNORMAL LOW (ref >90)
Glucose, Bld: 293 mg/dL — ABNORMAL HIGH (ref 70–99)
Potassium: 3.5 mEq/L (ref 3.5–5.1)
Sodium: 133 mEq/L — ABNORMAL LOW (ref 135–145)
Total Bilirubin: 0.6 mg/dL (ref 0.3–1.2)
Total Protein: 5.5 g/dL — ABNORMAL LOW (ref 6.0–8.3)

## 2013-05-07 LAB — URINE MICROSCOPIC-ADD ON

## 2013-05-07 LAB — PROTIME-INR
INR: 1.11 (ref 0.00–1.49)
Prothrombin Time: 14.1 seconds (ref 11.6–15.2)

## 2013-05-07 LAB — STREP PNEUMONIAE URINARY ANTIGEN: Strep Pneumo Urinary Antigen: NEGATIVE

## 2013-05-07 LAB — CBC
HCT: 29 % — ABNORMAL LOW (ref 36.0–46.0)
Hemoglobin: 9.8 g/dL — ABNORMAL LOW (ref 12.0–15.0)
MCH: 29.6 pg (ref 26.0–34.0)
MCHC: 33.8 g/dL (ref 30.0–36.0)
MCV: 87.6 fL (ref 78.0–100.0)
Platelets: 196 10*3/uL (ref 150–400)
RBC: 3.31 MIL/uL — ABNORMAL LOW (ref 3.87–5.11)
RDW: 14 % (ref 11.5–15.5)
WBC: 19.5 10*3/uL — ABNORMAL HIGH (ref 4.0–10.5)

## 2013-05-07 LAB — GLUCOSE, CAPILLARY
Glucose-Capillary: 254 mg/dL — ABNORMAL HIGH (ref 70–99)
Glucose-Capillary: 212 mg/dL — ABNORMAL HIGH (ref 70–99)
Glucose-Capillary: 204 mg/dL — ABNORMAL HIGH (ref 70–99)
Glucose-Capillary: 190 mg/dL — ABNORMAL HIGH (ref 70–99)

## 2013-05-07 LAB — HIV ANTIBODY (ROUTINE TESTING W REFLEX): HIV: NONREACTIVE

## 2013-05-07 LAB — LACTATE DEHYDROGENASE: LDH: 209 U/L (ref 94–250)

## 2013-05-07 LAB — APTT: aPTT: 36 seconds (ref 24–37)

## 2013-05-07 MED ORDER — VANCOMYCIN HCL IN DEXTROSE 1-5 GM/200ML-% IV SOLN
1000.0000 mg | Freq: Two times a day (BID) | INTRAVENOUS | Status: DC
  Administered 2013-05-07 – 2013-05-10 (×7): 1000 mg via INTRAVENOUS
  Filled 2013-05-07 (×9): qty 200

## 2013-05-07 MED ORDER — INSULIN DETEMIR 100 UNIT/ML ~~LOC~~ SOLN
25.0000 [IU] | Freq: Every day | SUBCUTANEOUS | Status: DC
  Administered 2013-05-07 – 2013-05-09 (×3): 25 [IU] via SUBCUTANEOUS
  Filled 2013-05-07 (×4): qty 0.25

## 2013-05-07 MED ORDER — IOHEXOL 300 MG/ML  SOLN
80.0000 mL | Freq: Once | INTRAMUSCULAR | Status: AC | PRN
  Administered 2013-05-07: 80 mL via INTRAVENOUS

## 2013-05-07 NOTE — Progress Notes (Signed)
[facility] PROGRESS NOTE  Kelli Summers EAV:[id number] DOB: 1956-04-25 DOA: 05/06/2013 PCP: Colette Ribas, MD  Assessment/Plan: #1 HCAP with parapneumonic effusion:  -continue IV vancomycin, cefepime, and metronidazole. -IR consult for ultrasound-guided thoracentesis -Pulm consult per Dr.Hawkins  #2 Arthralgias and myalgias - Possibly due to acute illness from 1 - will D/w ID  -supportive care, afebrile at this point  #3 DM type 2, uncontrolled: -increase Levemir, SSI -HbA1c was 14.9   #4 lower abdominal pain/N/V -appears improved -KUB now and CT abdpelvis last week benign -advance diet  #5 hypokalemia: repleted  Consultants:  Pulm  IR for thoracentesis  Antibiotics:  Vanc  Cefepime  Flagyl  HPI/Subjective: Feels bad, still having aches and pains in all joints, no more fevers  Objective: Filed Vitals:   05/07/13 1300  BP:   Pulse:   Temp:   Resp: 14    Intake/Output Summary (Last 24 hours) at 05/07/13 1559 Last data filed at 05/07/13 1300  Gross per 24 hour  Intake 2106.67 ml  Output   1100 ml  Net 1006.67 ml   Filed Weights   05/06/13 1551 05/06/13 2240 05/07/13 0400  Weight: 81.647 kg (180 lb) 88.2 kg (194 lb 7.1 oz) 88.4 kg (194 lb 14.2 oz)    Exam:   General:  AAOx3, ill appearing, no distress  Cardiovascular:S1S2/RRR  Respiratory: diminished BS at bases  Abdomen: soft, Nt, ND, BS present  Musculoskeletal: no edema c/c  Data Reviewed: Basic Metabolic Panel:  Recent Labs Lab 05/01/13 0533 05/02/13 0545 05/06/13 1649 05/07/13 0507  NA 133* 135 131* 133*  K 4.0 4.3 2.3* 3.5  CL 103 106 99 102  CO2 16* 17* 20 20  GLUCOSE 317* 220* 322* 293*  BUN 21 12 16 16   CREATININE 0.95 0.71 0.91 1.05  CALCIUM 8.4 9.0 8.7 8.4  MG  --   --  1.4*  --    Liver Function Tests:  Recent Labs Lab 05/02/13 1251 05/06/13 1649 05/07/13 0507  AST 26 15 18   ALT 43* 15 16  ALKPHOS 216* 211* 215*  BILITOT 0.6 0.6 0.6  PROT  5.9* 5.6* 5.5*  ALBUMIN 2.1* 1.5* 1.5*    Recent Labs Lab 05/02/13 1251 05/06/13 2037  LIPASE 13 10*   No results found for this basename: AMMONIA,  in the last 168 hours CBC:  Recent Labs Lab 05/02/13 1251 05/06/13 1649 05/07/13 0507  WBC 10.4 13.5* 19.5*  NEUTROABS  --  12.7*  --   HGB 10.9* 10.8* 9.8*  HCT 32.3* 31.9* 29.0*  MCV 89.7 87.2 87.6  PLT 189 192 196   Cardiac Enzymes: No results found for this basename: CKTOTAL, CKMB, CKMBINDEX, TROPONINI,  in the last 168 hours BNP (last 3 results) No results found for this basename: PROBNP,  in the last 8760 hours CBG:  Recent Labs Lab 05/02/13 1141 05/06/13 1800 05/06/13 2258 05/07/13 0713 05/07/13 1153  GLUCAP 187* 233* 237* 254* 212*    Recent Results (from the past 240 hour(s))  MRSA PCR SCREENING     Status: None   Collection Time    04/30/13 12:20 AM      Result Value Range Status   MRSA by PCR NEGATIVE  NEGATIVE Final   Comment:            The GeneXpert MRSA Assay (FDA     approved for NASAL specimens     only), is one component of a     comprehensive MRSA colonization  surveillance program. It is not     intended to diagnose MRSA     infection nor to guide or     monitor treatment for     MRSA infections.  CULTURE, BLOOD (ROUTINE X 2)     Status: None   Collection Time    05/06/13 11:38 PM      Result Value Range Status   Specimen Description Blood BLOOD RIGHT HAND   Final   Special Requests BOTTLES DRAWN AEROBIC AND ANAEROBIC 5CC   Final   Culture NO GROWTH <24 HRS   Final   Report Status PENDING   Incomplete  CULTURE, BLOOD (ROUTINE X 2)     Status: None   Collection Time    05/06/13 11:47 PM      Result Value Range Status   Specimen Description Blood RIGHT ANTECUBITAL   Final   Special Requests BOTTLES DRAWN AEROBIC AND ANAEROBIC 8CC   Final   Culture NO GROWTH <24 HRS   Final   Report Status PENDING   Incomplete     Studies: Dg Lumbar Spine Complete  05/06/2013   CLINICAL  DATA:  Chronic low back pain  EXAM: LUMBAR SPINE - COMPLETE 4+ VIEW  COMPARISON:  Prior CT from 04/29/2013  FINDINGS: Overall alignment is stable as compared to the prior examination with trace retrolisthesis of L3 on L4. Sequelae of prior vertebral augmentation are again seen at L3 and L4, unchanged. Vertebral body height loss T11, T12, L3, and L4 levels is not significantly changed. Multilevel degenerative disc disease and facet arthrosis is grossly similar, most severe at L2-3. No acute fracture listhesis. Diffuse osteopenia is present.  Paravertebral soft tissues are within normal limits. Prominent atherosclerotic calcifications noted within the infrarenal aorta.  IMPRESSION: 1. No acute osseous abnormality identified within the lumbar spine. 2. Stable appearance of the lumbar spine with prior vertebral augmentation at L3 and L4 with multilevel compression deformities and degenerative disc disease. .   Electronically Signed   By: Rise Mu M.D.   On: 05/06/2013 22:44   [place] Chest  05/07/2013   CLINICAL DATA:  Patient presents for ultrasound-guided thoracentesis of the right chest for a new area of loculated pleural fluid on the right.  EXAM: CHEST ULTRASOUND  COMPARISON:  Chest radiograph, 05/06/2013  FINDINGS: There is a loculated area pleural fluid at the right lung base that is mostly surrounded by lung that has internal echoes consistent with consolidated lung. It is unclear whether this fluid resides within the lung, within the visceral pleura, or whether this is a loculated pleural effusion, or combination of both. For this reason, thoracentesis was not performed. The patient 1 ago a contrast enhanced chest CT for further delineation of this collection.  A small left pleural effusion was incidentally noted.  IMPRESSION: Loculated right pleural fluid versus an area of necrosis in consolidated right lung or combination of both.   Electronically Signed   By: Amie Portland M.D.   On: 05/07/2013  13:37   Dg Abd Acute W/chest  05/06/2013   CLINICAL DATA:  Chronic abdominal pain.  EXAM: ACUTE ABDOMEN SERIES (ABDOMEN 2 VIEW & CHEST 1 VIEW)  COMPARISON:  04/29/2013.  FINDINGS: The cardiac silhouette, mediastinal and hilar contours are within normal limits and stable There is a new probable loculated pleural fluid collection on the right and a right lung infiltrate. This is unlikely a mass given the recent normal Chest x-ray and chest CT. The left lung is clear. The bony thorax  is intact.  The abdominal radiographs demonstrate an unremarkable bowel gas pattern. There is scattered contrast throughout the colon. The soft tissue shadows are maintained. The bony structures are intact. Stable vertebral augmentation changes at L3 and L4 new large probable loculated right pleural effusion and right lung infiltrate.  IMPRESSION: New probable loculated right pleural fluid collection and right lung infiltrate.  No acute abdominal findings. Contrast noted throughout the colon.   Electronically Signed   By: Loralie Champagne M.D.   On: 05/06/2013 19:46    Scheduled Meds: . ceFEPime (MAXIPIME) IV  1 g Intravenous Q8H  . DULoxetine  60 mg Oral Daily  . insulin aspart  0-15 Units Subcutaneous TID WC  . insulin aspart  0-5 Units Subcutaneous QHS  . insulin detemir  20 Units Subcutaneous QHS  . levalbuterol  0.63 mg Nebulization Q6H  . metronidazole  500 mg Intravenous Q8H  . sodium chloride  3 mL Intravenous Q12H  . vancomycin  1,000 mg Intravenous Q12H   Continuous Infusions: . 0.9 % NaCl with KCl 20 mEq / L 50 mL/hr at 05/07/13 1428    Principal Problem:   HCAP (healthcare-associated pneumonia) Active Problems:   Body aches   Diabetes mellitus type 2, uncontrolled   Pleural effusion, right   Fever   Hypokalemia    Time spent:79min    [facility]  [facility] Pager [phone]. If 7PM-7AM, please contact night-coverage at [email or web address], password [facility] 05/07/2013, 3:59 PM  LOS: 1 day

## 2013-05-07 NOTE — Progress Notes (Signed)
ANTIBIOTIC CONSULT NOTE - INITIAL  Pharmacy Consult for Vancomycin Indication: pneumonia  Allergies  Allergen Reactions  . Ciprofloxacin Itching  . Hydrocodone-Acetaminophen Itching  . Penicillins Itching  . Percocet [Oxycodone-Acetaminophen] Itching   Patient Measurements: Height: 5\' 5"  (165.1 cm) Weight: 194 lb 14.2 oz (88.4 kg) IBW/kg (Calculated) : 57  Vital Signs: Temp: 98.3 F (36.8 C) (11/10 0819) Temp src: Oral (11/10 0819) BP: 123/67 mmHg (11/10 0900) Pulse Rate: 122 (11/10 0700) Intake/Output from previous day: 11/09 0701 - 11/10 0700 In: 1306.7 [I.V.:706.7; IV Piggyback:600] Out: 800 [Urine:800] Intake/Output from this shift: Total I/O In: 400 [I.V.:200; IV Piggyback:200] Out: 300 [Urine:300]  Labs:  Recent Labs  05/06/13 1649 05/07/13 0507  WBC 13.5* 19.5*  HGB 10.8* 9.8*  PLT 192 196  CREATININE 0.91 1.05   Estimated Creatinine Clearance: 65 ml/min (by C-G formula based on Cr of 1.05). No results found for this basename: VANCOTROUGH, Leodis Binet, VANCORANDOM, GENTTROUGH, GENTPEAK, GENTRANDOM, TOBRATROUGH, TOBRAPEAK, TOBRARND, AMIKACINPEAK, AMIKACINTROU, AMIKACIN,  in the last 72 hours   Microbiology: Recent Results (from the past 720 hour(s))  MRSA PCR SCREENING     Status: None   Collection Time    04/30/13 12:20 AM      Result Value Range Status   MRSA by PCR NEGATIVE  NEGATIVE Final   Comment:            The GeneXpert MRSA Assay (FDA     approved for NASAL specimens     only), is one component of a     comprehensive MRSA colonization     surveillance program. It is not     intended to diagnose MRSA     infection nor to guide or     monitor treatment for     MRSA infections.  CULTURE, BLOOD (ROUTINE X 2)     Status: None   Collection Time    05/06/13 11:38 PM      Result Value Range Status   Specimen Description Blood BLOOD RIGHT HAND   Final   Special Requests BOTTLES DRAWN AEROBIC AND ANAEROBIC 5CC   Final   Culture PENDING    Incomplete   Report Status PENDING   Incomplete  CULTURE, BLOOD (ROUTINE X 2)     Status: None   Collection Time    05/06/13 11:47 PM      Result Value Range Status   Specimen Description Blood RIGHT ANTECUBITAL   Final   Special Requests BOTTLES DRAWN AEROBIC AND ANAEROBIC 8CC   Final   Culture PENDING   Incomplete   Report Status PENDING   Incomplete   Medical History: Past Medical History  Diagnosis Date  . Diabetes mellitus   . History of stress test 01/2010    normal myoview stress test  . Chronic back pain   . Depression   . Chronic knee pain   . Hypertension    Medications:  Scheduled:  . ceFEPime (MAXIPIME) IV  1 g Intravenous Q8H  . DULoxetine  60 mg Oral Daily  . insulin aspart  0-15 Units Subcutaneous TID WC  . insulin aspart  0-5 Units Subcutaneous QHS  . insulin detemir  20 Units Subcutaneous QHS  . levalbuterol  0.63 mg Nebulization Q6H  . metronidazole  500 mg Intravenous Q8H  . sodium chloride  3 mL Intravenous Q12H  . vancomycin  1,000 mg Intravenous Q12H   Assessment: 57yo female who was recently hospitalized with fever and body aches.  Pt was discharged and now readmitted  with fever and pain.  Pt has good renal fxn.  Estimated Creatinine Clearance: 65 ml/min (by C-G formula based on Cr of 1.05). Pt received Vancomycin 1gm x 1 on admission.  Pt has elevated WBC.  Vancomycin 11/9 >> Cefepime 11/9 >> Flagyl 11/9 >>  Goal of Therapy:  Vancomycin trough level 15-20 mcg/ml  Plan:  Vancomycin 1gm IV q12hrs Check trough at steady state Monitor labs, renal fxn, and cultures  Valrie Hart A 05/07/2013,11:15 AM

## 2013-05-07 NOTE — Progress Notes (Signed)
[facility] chart review completed.  

## 2013-05-07 NOTE — Consult Note (Signed)
Consult requested by: Dr. Rito Ehrlich  Consult requested for pleural effusion:  HPI: This is a 57 year old who was hospitalized about a week ago with hyperglycemia. She been having fever and body aches and according to family has been complaining of shortness of breath for the last 2 weeks or so. She had been in [place] and there was some concern about chikungunya  Virus. She had a chest since then she has become increasingly short of breath and she came to the emergency department again with a temperature of 101 and was found to have a large pleural effusion and what looks like pneumoniaCT that showed some pleural thickening but no fluid.  Past Medical History  Diagnosis Date  . Diabetes mellitus   . History of stress test 01/2010    normal myoview stress test  . Chronic back pain   . Depression   . Chronic knee pain   . Hypertension      History reviewed. No pertinent family history.   History   Social History  . Marital Status: Divorced    Spouse Name: N/A    Number of Children: N/A  . Years of Education: N/A   Social History Main Topics  . Smoking status: Current Every Day Smoker -- 0.30 packs/day    Types: Cigarettes  . Smokeless tobacco: Current User  . Alcohol Use: Yes  . Drug Use: 2.00 per week    Special: Marijuana  . Sexual Activity: None   Other Topics Concern  . None   Social History Narrative  . None     ROS:  she's had nausea and vomiting in the last few days. She's had some abdominal discomfort and had some lower back pain. She continues to have fairly diffuse aching.    Objective: Vital signs in last 24 hours: Temp:  [98.6 F (37 C)-99.7 F (37.6 C)] 98.9 F (37.2 C) (11/10 0400) Pulse Rate:  [118-136] 127 (11/10 0600) Resp:  [16-25] 21 (11/10 0600) BP: (98-152)/(52-124) 121/57 mmHg (11/10 0600) SpO2:  [91 %-98 %] 96 % (11/10 0716) Weight:  [81.647 kg (180 lb)-88.4 kg (194 lb 14.2 oz)] 88.4 kg (194 lb 14.2 oz) (11/10 0400) Weight change:  Last  BM Date: 05/05/13  Intake/Output from previous day: 11/09 0701 - 11/10 0700 In: 1206.7 [I.V.:606.7; IV Piggyback:600] Out: 800 [Urine:800]  PHYSICAL EXAM She is awake and alert. She is splinting the right side of her chest. She is moderately obese. Her pupils are reactive. Her nose and throat clear. Her neck is supple without masses. Her chest is with rhonchi and diminished breath sounds on the right. Her heart is regular without gallop. Her abdomen is soft. Extremities show no edema. Central nervous system exam is grossly intact  Lab Results: Basic Metabolic Panel:  Recent Labs  16/10/96 1649 05/07/13 0507  NA 131* 133*  K 2.3* 3.5  CL 99 102  CO2 20 20  GLUCOSE 322* 293*  BUN 16 16  CREATININE 0.91 1.05  CALCIUM 8.7 8.4  MG 1.4*  --    Liver Function Tests:  Recent Labs  05/06/13 1649 05/07/13 0507  AST 15 18  ALT 15 16  ALKPHOS 211* 215*  BILITOT 0.6 0.6  PROT 5.6* 5.5*  ALBUMIN 1.5* 1.5*    Recent Labs  05/06/13 2037  LIPASE 10*   No results found for this basename: AMMONIA,  in the last 72 hours CBC:  Recent Labs  05/06/13 1649 05/07/13 0507  WBC 13.5* 19.5*  NEUTROABS 12.7*  --  HGB 10.8* 9.8*  HCT 31.9* 29.0*  MCV 87.2 87.6  PLT 192 196   Cardiac Enzymes: No results found for this basename: CKTOTAL, CKMB, CKMBINDEX, TROPONINI,  in the last 72 hours BNP: No results found for this basename: PROBNP,  in the last 72 hours D-Dimer: No results found for this basename: DDIMER,  in the last 72 hours CBG:  Recent Labs  05/06/13 1800 05/06/13 2258 05/07/13 0713  GLUCAP 233* 237* 254*   Hemoglobin A1C: No results found for this basename: HGBA1C,  in the last 72 hours Fasting Lipid Panel: No results found for this basename: CHOL, HDL, LDLCALC, TRIG, CHOLHDL, LDLDIRECT,  in the last 72 hours Thyroid Function Tests: No results found for this basename: TSH, T4TOTAL, FREET4, T3FREE, THYROIDAB,  in the last 72 hours Anemia Panel: No results  found for this basename: VITAMINB12, FOLATE, FERRITIN, TIBC, IRON, RETICCTPCT,  in the last 72 hours Coagulation:  Recent Labs  05/07/13 0507  LABPROT 14.1  INR 1.11   Urine Drug Screen: Drugs of Abuse  No results found for this basename: labopia, cocainscrnur, labbenz, amphetmu, thcu, labbarb    Alcohol Level: No results found for this basename: ETH,  in the last 72 hours Urinalysis:  Recent Labs  05/07/13 0207  COLORURINE YELLOW  LABSPEC 1.025  PHURINE 6.0  GLUCOSEU 250*  HGBUR MODERATE*  BILIRUBINUR NEGATIVE  KETONESUR 15*  PROTEINUR 100*  UROBILINOGEN 1.0  NITRITE POSITIVE*  LEUKOCYTESUR SMALL*   Misc. Labs:   ABGS: No results found for this basename: PHART, PCO2, PO2ART, TCO2, HCO3,  in the last 72 hours   MICROBIOLOGY: Recent Results (from the past 240 hour(s))  MRSA PCR SCREENING     Status: None   Collection Time    04/30/13 12:20 AM      Result Value Range Status   MRSA by PCR NEGATIVE  NEGATIVE Final   Comment:            The GeneXpert MRSA Assay (FDA     approved for NASAL specimens     only), is one component of a     comprehensive MRSA colonization     surveillance program. It is not     intended to diagnose MRSA     infection nor to guide or     monitor treatment for     MRSA infections.  CULTURE, BLOOD (ROUTINE X 2)     Status: None   Collection Time    05/06/13 11:38 PM      Result Value Range Status   Specimen Description Blood BLOOD RIGHT HAND   Final   Special Requests BOTTLES DRAWN AEROBIC AND ANAEROBIC 5CC   Final   Culture PENDING   Incomplete   Report Status PENDING   Incomplete  CULTURE, BLOOD (ROUTINE X 2)     Status: None   Collection Time    05/06/13 11:47 PM      Result Value Range Status   Specimen Description Blood RIGHT ANTECUBITAL   Final   Special Requests BOTTLES DRAWN AEROBIC AND ANAEROBIC 8CC   Final   Culture PENDING   Incomplete   Report Status PENDING   Incomplete    Studies/Results: Dg Lumbar Spine  Complete  05/06/2013   CLINICAL DATA:  Chronic low back pain  EXAM: LUMBAR SPINE - COMPLETE 4+ VIEW  COMPARISON:  Prior CT from 04/29/2013  FINDINGS: Overall alignment is stable as compared to the prior examination with trace retrolisthesis of L3 on L4. Sequelae of  prior vertebral augmentation are again seen at L3 and L4, unchanged. Vertebral body height loss T11, T12, L3, and L4 levels is not significantly changed. Multilevel degenerative disc disease and facet arthrosis is grossly similar, most severe at L2-3. No acute fracture listhesis. Diffuse osteopenia is present.  Paravertebral soft tissues are within normal limits. Prominent atherosclerotic calcifications noted within the infrarenal aorta.  IMPRESSION: 1. No acute osseous abnormality identified within the lumbar spine. 2. Stable appearance of the lumbar spine with prior vertebral augmentation at L3 and L4 with multilevel compression deformities and degenerative disc disease. .   Electronically Signed   By: Rise Mu M.D.   On: 05/06/2013 22:44   Dg Abd Acute W/chest  05/06/2013   CLINICAL DATA:  Chronic abdominal pain.  EXAM: ACUTE ABDOMEN SERIES (ABDOMEN 2 VIEW & CHEST 1 VIEW)  COMPARISON:  04/29/2013.  FINDINGS: The cardiac silhouette, mediastinal and hilar contours are within normal limits and stable There is a new probable loculated pleural fluid collection on the right and a right lung infiltrate. This is unlikely a mass given the recent normal Chest x-ray and chest CT. The left lung is clear. The bony thorax is intact.  The abdominal radiographs demonstrate an unremarkable bowel gas pattern. There is scattered contrast throughout the colon. The soft tissue shadows are maintained. The bony structures are intact. Stable vertebral augmentation changes at L3 and L4 new large probable loculated right pleural effusion and right lung infiltrate.  IMPRESSION: New probable loculated right pleural fluid collection and right lung infiltrate.  No  acute abdominal findings. Contrast noted throughout the colon.   Electronically Signed   By: Loralie Champagne M.D.   On: 05/06/2013 19:46    Medications:  Scheduled: . ceFEPime (MAXIPIME) IV  1 g Intravenous Q8H  . DULoxetine  60 mg Oral Daily  . insulin aspart  0-15 Units Subcutaneous TID WC  . insulin aspart  0-5 Units Subcutaneous QHS  . insulin detemir  20 Units Subcutaneous QHS  . levalbuterol  0.63 mg Nebulization Q6H  . metronidazole  500 mg Intravenous Q8H  . sodium chloride  3 mL Intravenous Q12H  . vancomycin  1,000 mg Intravenous Q12H   Continuous: . 0.9 % NaCl with KCl 20 mEq / L 100 mL/hr at 05/07/13 0600   WUJ:[id number], acetaminophen, diazepam, morphine injection, ondansetron (ZOFRAN) IV, ondansetron  Assesment: she has healthcare associated pneumonia and a large pleural effusion. By chest x-ray this looks like it may be loculated. She's going to have a ultrasound guided thoracentesis and depending on the results of that she may need a VAC procedure  Principal Problem:   HCAP (healthcare-associated pneumonia) Active Problems:   Body aches   Diabetes mellitus type 2, uncontrolled   Pleural effusion, right   Fever   Hypokalemia    Plan: thoracentesis and then decisions from there about what she may need     LOS: 1 day   Dickie Cloe L 05/07/2013, 8:14 AM

## 2013-05-07 NOTE — Progress Notes (Signed)
INITIAL NUTRITION ASSESSMENT  DOCUMENTATION CODES Per approved criteria  -Obesity Unspecified   INTERVENTION:  Add Glucerna TID when diet advanced to full liquids  Provide DM diet education prior to discharge  RD will follow for nutrition care  NUTRITION DIAGNOSIS:  Inadequate oral intake related to nausea, vomiting as evidenced by pt reported poor oral intake.   Goal: Pt will tolerate diet advancement and improve oral intake to meet >90% of estimated energy, protein and fluid daily.  Monitor:  Diet advancement, tolerance Labs, weight changes  Reason for Assessment: Malnutrition Screen Score = 3  57 y.o. female  Admitting Dx: HCAP (healthcare-associated pneumonia)  ASSESSMENT: Chart reviewed. Recent admission related to hyperglycemia. Diabetes poorly controlled (HgA1C-14.9%). Currently has Healthcare Associated PNA. Hypomagnesemia. Weight gain 10#,5% x 7 days. At risk for Malnutrition related to acute illness.   Patient Active Problem List   Diagnosis Date Noted  . Pleural effusion, right 05/06/2013  . HCAP (healthcare-associated pneumonia) 05/06/2013  . Fever 05/06/2013  . Hypokalemia 05/06/2013  . Hyperglycemia without ketosis 04/30/2013  . Body aches 04/30/2013  . Diabetes mellitus type 2, uncontrolled 04/30/2013  . DKA (diabetic ketoacidoses) 04/30/2013  . Hyperglycemia 04/30/2013  . Hypotension, unspecified 04/30/2013  . SHOULDER PAIN 03/02/2007  . DIABETES 02/14/2007  . HIGH BLOOD PRESSURE 02/14/2007    Height: Ht Readings from Last 1 Encounters:  05/06/13 5\' 5"  (1.651 m)    Weight: Wt Readings from Last 1 Encounters:  05/06/13 194 lb 7.1 oz (88.2 kg)    Ideal Body Weight: 125# (56.8 kg)  % Ideal Body Weight: 155%  Wt Readings from Last 10 Encounters:  05/06/13 194 lb 7.1 oz (88.2 kg)  04/29/13 183 lb 13.8 oz (83.4 kg)  12/30/12 180 lb (81.647 kg)  09/06/11 194 lb (87.998 kg)    Usual Body Weight: 180-190#  % Usual Body Weight:  100%  BMI:  Body mass index is 32.36 kg/(m^2).Obesity class I  Estimated Nutritional Needs: Kcal: 1500-1700 Protein: 63-77 gr Fluid: >1800 ml daily   Skin: intact  Diet Order: Clear Liquid  EDUCATION NEEDS: -Education not appropriate at this time  No intake or output data in the 24 hours ending 05/07/13 0128  Last BM: PTA Labs:   Recent Labs Lab 05/01/13 0533 05/02/13 0545 05/06/13 1649  NA 133* 135 131*  K 4.0 4.3 2.3*  CL 103 106 99  CO2 16* 17* 20  BUN 21 12 16   CREATININE 0.95 0.71 0.91  CALCIUM 8.4 9.0 8.7  MG  --   --  1.4*  GLUCOSE 317* 220* 322*    CBG (last 3)   Recent Labs  05/06/13 1800 05/06/13 2258  GLUCAP 233* 237*    Scheduled Meds: . ceFEPime (MAXIPIME) IV  1 g Intravenous Q8H  . DULoxetine  60 mg Oral Daily  . insulin aspart  0-15 Units Subcutaneous TID WC  . insulin aspart  0-5 Units Subcutaneous QHS  . insulin detemir  20 Units Subcutaneous QHS  . levalbuterol  0.63 mg Nebulization Q6H  . metronidazole  500 mg Intravenous Once  . metronidazole  500 mg Intravenous Q8H  . potassium chloride  10 mEq Intravenous Q1 Hr x 4  . sodium chloride  3 mL Intravenous Q12H    Continuous Infusions: . 0.9 % NaCl with KCl 20 mEq / L 100 mL/hr at 05/06/13 2356    Past Medical History  Diagnosis Date  . Diabetes mellitus   . History of stress test 01/2010    normal myoview  stress test  . Chronic back pain   . Depression   . Chronic knee pain   . Hypertension     Past Surgical History  Procedure Laterality Date  . Back surgery    . Cholecystectomy    . Abdominal hysterectomy      Royann Shivers MS,RD,CSG,LDN Office: #[phone] Pager: [phone]

## 2013-05-07 NOTE — Progress Notes (Signed)
Spoke to Dr. Morton Peters and he will see her after she is transferred

## 2013-05-07 NOTE — Progress Notes (Signed)
Inpatient Diabetes Program Recommendations  AACE/ADA: New Consensus Statement on Inpatient Glycemic Control (2013)  Target Ranges:  Prepandial:   less than 140 mg/dL      Peak postprandial:   less than 180 mg/dL (1-2 hours)      Critically ill patients:  140 - 180 mg/dL  Results for ANAGABRIELA, JOKERST (MRN [id number]) as of 05/07/2013 08:45  Ref. Range 04/30/2013 05:03  Hemoglobin A1C Latest Range: <5.7 % 14.9 (H)  Results for KAROLYNE, TIMMONS (MRN [id number]) as of 05/07/2013 08:45  Ref. Range 05/06/2013 16:49 05/07/2013 05:07  Glucose Latest Range: 70-99 mg/dL 562 (H) 130 (H)   Results for TWILIA, YAKLIN (MRN [id number]) as of 05/07/2013 08:45  Ref. Range 05/06/2013 18:00 05/06/2013 22:58 05/07/2013 07:13  Glucose-Capillary Latest Range: 70-99 mg/dL 295 (H) 284 (H) 132 (H)    Inpatient Diabetes Program Recommendations Correction (SSI): Please consider increasing Novolog correction to resistant correction scale. Insulin - Meal Coverage: Once diet is advanced and patient is eating at least 50% of meals, please consider ordering Novolog 4 units TID with meals for meal coverage.  Thanks, Orlando Penner, RN, MSN, CCRN Diabetes Coordinator Inpatient Diabetes Program [phone] (Team Pager) [phone] ([place] office) [phone] [facility] office)

## 2013-05-08 DIAGNOSIS — J90 Pleural effusion, not elsewhere classified: Secondary | ICD-10-CM | POA: Diagnosis present

## 2013-05-08 DIAGNOSIS — J869 Pyothorax without fistula: Secondary | ICD-10-CM

## 2013-05-08 LAB — BASIC METABOLIC PANEL
BUN: 20 mg/dL (ref 6–23)
CO2: 21 mEq/L (ref 19–32)
Calcium: 8.6 mg/dL (ref 8.4–10.5)
Chloride: 101 mEq/L (ref 96–112)
Creatinine, Ser: 1.01 mg/dL (ref 0.50–1.10)
GFR calc Af Amer: 70 mL/min — ABNORMAL LOW (ref >90)
GFR calc non Af Amer: 61 mL/min — ABNORMAL LOW (ref >90)
Glucose, Bld: 240 mg/dL — ABNORMAL HIGH (ref 70–99)
Potassium: 3.2 mEq/L — ABNORMAL LOW (ref 3.5–5.1)
Sodium: 132 mEq/L — ABNORMAL LOW (ref 135–145)

## 2013-05-08 LAB — COMPREHENSIVE METABOLIC PANEL
ALT: 13 U/L (ref 0–35)
AST: 14 U/L (ref 0–37)
Albumin: 1.4 g/dL — ABNORMAL LOW (ref 3.5–5.2)
Alkaline Phosphatase: 207 U/L — ABNORMAL HIGH (ref 39–117)
BUN: 20 mg/dL (ref 6–23)
CO2: 20 mEq/L (ref 19–32)
Calcium: 8.3 mg/dL — ABNORMAL LOW (ref 8.4–10.5)
Chloride: 99 mEq/L (ref 96–112)
Creatinine, Ser: 1 mg/dL (ref 0.50–1.10)
GFR calc Af Amer: 71 mL/min — ABNORMAL LOW (ref >90)
GFR calc non Af Amer: 61 mL/min — ABNORMAL LOW (ref >90)
Glucose, Bld: 209 mg/dL — ABNORMAL HIGH (ref 70–99)
Potassium: 3.5 mEq/L (ref 3.5–5.1)
Sodium: 132 mEq/L — ABNORMAL LOW (ref 135–145)
Total Bilirubin: 0.5 mg/dL (ref 0.3–1.2)
Total Protein: 5.7 g/dL — ABNORMAL LOW (ref 6.0–8.3)

## 2013-05-08 LAB — CBC
HCT: 27.9 % — ABNORMAL LOW (ref 36.0–46.0)
HCT: 25.8 % — ABNORMAL LOW (ref 36.0–46.0)
Hemoglobin: 9.4 g/dL — ABNORMAL LOW (ref 12.0–15.0)
Hemoglobin: 9.1 g/dL — ABNORMAL LOW (ref 12.0–15.0)
MCH: 29.7 pg (ref 26.0–34.0)
MCH: 30.7 pg (ref 26.0–34.0)
MCHC: 33.7 g/dL (ref 30.0–36.0)
MCHC: 35.3 g/dL (ref 30.0–36.0)
MCV: 88.3 fL (ref 78.0–100.0)
MCV: 87.2 fL (ref 78.0–100.0)
Platelets: 212 10*3/uL (ref 150–400)
Platelets: 217 10*3/uL (ref 150–400)
RBC: 3.16 MIL/uL — ABNORMAL LOW (ref 3.87–5.11)
RBC: 2.96 MIL/uL — ABNORMAL LOW (ref 3.87–5.11)
RDW: 14.3 % (ref 11.5–15.5)
RDW: 14.3 % (ref 11.5–15.5)
WBC: 18.2 10*3/uL — ABNORMAL HIGH (ref 4.0–10.5)
WBC: 15.9 10*3/uL — ABNORMAL HIGH (ref 4.0–10.5)

## 2013-05-08 LAB — GLUCOSE, CAPILLARY
Glucose-Capillary: 191 mg/dL — ABNORMAL HIGH (ref 70–99)
Glucose-Capillary: 209 mg/dL — ABNORMAL HIGH (ref 70–99)
Glucose-Capillary: 184 mg/dL — ABNORMAL HIGH (ref 70–99)
Glucose-Capillary: 249 mg/dL — ABNORMAL HIGH (ref 70–99)
Glucose-Capillary: 196 mg/dL — ABNORMAL HIGH (ref 70–99)

## 2013-05-08 LAB — APTT: aPTT: 36 seconds (ref 24–37)

## 2013-05-08 LAB — URINE CULTURE

## 2013-05-08 LAB — BLOOD GAS, ARTERIAL
Acid-base deficit: 4 mmol/L — ABNORMAL HIGH (ref 0.0–2.0)
Bicarbonate: 20.9 mEq/L (ref 20.0–24.0)
Drawn by: 39866
O2 Content: 3 L/min
O2 Saturation: 93.6 %
Patient temperature: 98.9
TCO2: 22.2 mmol/L (ref 0–100)
pCO2 arterial: 40.7 mmHg (ref 35.0–45.0)
pH, Arterial: 7.332 — ABNORMAL LOW (ref 7.350–7.450)
pO2, Arterial: 68.3 mmHg — ABNORMAL LOW (ref 80.0–100.0)

## 2013-05-08 LAB — ABO/RH: ABO/RH(D): O POS

## 2013-05-08 LAB — LEGIONELLA ANTIGEN, URINE: Legionella Antigen, Urine: NEGATIVE

## 2013-05-08 LAB — STREP PNEUMONIAE URINARY ANTIGEN: Strep Pneumo Urinary Antigen: NEGATIVE

## 2013-05-08 LAB — PROTIME-INR
INR: 1.14 (ref 0.00–1.49)
Prothrombin Time: 14.4 seconds (ref 11.6–15.2)

## 2013-05-08 MED ORDER — OXYCODONE-ACETAMINOPHEN 5-325 MG PO TABS
1.0000 | ORAL_TABLET | Freq: Four times a day (QID) | ORAL | Status: DC | PRN
  Administered 2013-05-12 – 2013-05-18 (×16): 1 via ORAL
  Filled 2013-05-08 (×17): qty 1

## 2013-05-08 MED ORDER — METOPROLOL TARTRATE 1 MG/ML IV SOLN
5.0000 mg | Freq: Once | INTRAVENOUS | Status: AC
  Administered 2013-05-08: 5 mg via INTRAVENOUS
  Filled 2013-05-08: qty 5

## 2013-05-08 MED ORDER — MORPHINE SULFATE 2 MG/ML IJ SOLN
2.0000 mg | INTRAMUSCULAR | Status: DC | PRN
  Administered 2013-05-08 – 2013-05-09 (×2): 2 mg via INTRAVENOUS
  Filled 2013-05-08 (×2): qty 1

## 2013-05-08 NOTE — Progress Notes (Signed)
CRITICAL VALUE ALERT  Critical value received:  Aerobic bottle gram positive cocci in clusters  Date of notification:  05/08/2013  Time of notification:  0530  Critical value read back: Yes  Nurse who received alert:  [place] Aleisa Howk RN  MD notified (1st page):  Dr. Butler Denmark   Time of first page: [phone]

## 2013-05-08 NOTE — Progress Notes (Addendum)
[facility] PROGRESS NOTE  Kelli Summers XBJ:[id number] DOB: [birth year] DOA: 05/06/2013 PCP: Colette Ribas, MD  Assessment/Plan: #1 HCAP with loculated pleural effusion -continue IV vancomycin, cefepime, and metronidazole Day 2 -IR was consulted for ultrasound-guided thoracentesis yesterday then CT done showed loculation and hence thoracentesis deferred, Dr.Hawkins d/w CVTS Dr.Van Tright last pm, who will consult when pt transfered -will transfer to [facility] today will likely needs VATS/decortication/chest tube -Pulm consult per Dr.Hawkins appreciated  #2 Arthralgias and myalgias - Possibly due to acute illness from 1 -supportive care, afebrile at this point -serologies for infections sent out per PCP, still pending  #3 DM type 2, uncontrolled: -continue Levemir, SSI -HbA1c was 14.9  -since NPO this am, will not increase dose  #4 lower abdominal pain/N/V -appears improved -KUB now and CT abdpelvis last week benign -advanced diet yetserday, now NPO for procedure  #5 hypokalemia: repleted -labs pending this am  Consultants:  Pulm  IR for thoracentesis  Antibiotics:  Vanc  Cefepime  Flagyl  HPI/Subjective: Feels bad, still having aches and pains in all joints, no more fevers  Objective: Filed Vitals:   05/08/13 0600  BP: 109/65  Pulse:   Temp:   Resp: 12    Intake/Output Summary (Last 24 hours) at 05/08/13 0748 Last data filed at 05/08/13 0700  Gross per 24 hour  Intake 2323.33 ml  Output   1600 ml  Net 723.33 ml   Filed Weights   05/06/13 2240 05/07/13 0400 05/08/13 0500  Weight: 88.2 kg (194 lb 7.1 oz) 88.4 kg (194 lb 14.2 oz) 88.6 kg (195 lb 5.2 oz)    Exam:   General:  AAOx3, ill appearing, no distress  Cardiovascular:S1S2/RRR  Respiratory: diminished BS at bases  Abdomen: soft, Nt, ND, BS present  Musculoskeletal: no edema c/c  Data Reviewed: Basic Metabolic Panel:  Recent Labs Lab 05/02/13 0545 05/06/13 1649  05/07/13 0507  NA 135 131* 133*  K 4.3 2.3* 3.5  CL 106 99 102  CO2 17* 20 20  GLUCOSE 220* 322* 293*  BUN 12 16 16   CREATININE 0.71 0.91 1.05  CALCIUM 9.0 8.7 8.4  MG  --  1.4*  --    Liver Function Tests:  Recent Labs Lab 05/02/13 1251 05/06/13 1649 05/07/13 0507  AST 26 15 18   ALT 43* 15 16  ALKPHOS 216* 211* 215*  BILITOT 0.6 0.6 0.6  PROT 5.9* 5.6* 5.5*  ALBUMIN 2.1* 1.5* 1.5*    Recent Labs Lab 05/02/13 1251 05/06/13 2037  LIPASE 13 10*   No results found for this basename: AMMONIA,  in the last 168 hours CBC:  Recent Labs Lab 05/02/13 1251 05/06/13 1649 05/07/13 0507  WBC 10.4 13.5* 19.5*  NEUTROABS  --  12.7*  --   HGB 10.9* 10.8* 9.8*  HCT 32.3* 31.9* 29.0*  MCV 89.7 87.2 87.6  PLT 189 192 196   Cardiac Enzymes: No results found for this basename: CKTOTAL, CKMB, CKMBINDEX, TROPONINI,  in the last 168 hours BNP (last 3 results) No results found for this basename: PROBNP,  in the last 8760 hours CBG:  Recent Labs Lab 05/07/13 0713 05/07/13 1153 05/07/13 1620 05/07/13 2144 05/08/13 0708  GLUCAP 254* 212* 204* 190* 191*    Recent Results (from the past 240 hour(s))  MRSA PCR SCREENING     Status: None   Collection Time    04/30/13 12:20 AM      Result Value Range Status   MRSA by PCR NEGATIVE  NEGATIVE  Final   Comment:            The GeneXpert MRSA Assay (FDA     approved for NASAL specimens     only), is one component of a     comprehensive MRSA colonization     surveillance program. It is not     intended to diagnose MRSA     infection nor to guide or     monitor treatment for     MRSA infections.  CULTURE, BLOOD (ROUTINE X 2)     Status: None   Collection Time    05/06/13 11:38 PM      Result Value Range Status   Specimen Description Blood BLOOD RIGHT HAND   Final   Special Requests BOTTLES DRAWN AEROBIC AND ANAEROBIC 5CC   Final   Culture NO GROWTH <24 HRS   Final   Report Status PENDING   Incomplete  CULTURE, BLOOD  (ROUTINE X 2)     Status: None   Collection Time    05/06/13 11:47 PM      Result Value Range Status   Specimen Description Blood RIGHT ANTECUBITAL   Final   Special Requests BOTTLES DRAWN AEROBIC AND ANAEROBIC 8CC   Final   Culture NO GROWTH <24 HRS   Final   Report Status PENDING   Incomplete  URINE CULTURE     Status: None   Collection Time    05/07/13  2:07 AM      Result Value Range Status   Specimen Description URINE, CLEAN CATCH   Final   Special Requests NONE   Final   Culture  Setup Time     Final   Value: 05/07/2013 03:40     Performed at [facility] Count PENDING   Incomplete   Culture     Final   Value: Culture reincubated for better growth     Performed at [facility]   Report Status PENDING   Incomplete     Studies: Dg Lumbar Spine Complete  05/06/2013   CLINICAL DATA:  Chronic low back pain  EXAM: LUMBAR SPINE - COMPLETE 4+ VIEW  COMPARISON:  Prior CT from 04/29/2013  FINDINGS: Overall alignment is stable as compared to the prior examination with trace retrolisthesis of L3 on L4. Sequelae of prior vertebral augmentation are again seen at L3 and L4, unchanged. Vertebral body height loss T11, T12, L3, and L4 levels is not significantly changed. Multilevel degenerative disc disease and facet arthrosis is grossly similar, most severe at L2-3. No acute fracture listhesis. Diffuse osteopenia is present.  Paravertebral soft tissues are within normal limits. Prominent atherosclerotic calcifications noted within the infrarenal aorta.  IMPRESSION: 1. No acute osseous abnormality identified within the lumbar spine. 2. Stable appearance of the lumbar spine with prior vertebral augmentation at L3 and L4 with multilevel compression deformities and degenerative disc disease. .   Electronically Signed   By: Rise Mu M.D.   On: 05/06/2013 22:44   Ct Chest W Contrast  05/07/2013   CLINICAL DATA:  Patient with a loculated effusion. She was to have a  thoracentesis today he, but the initial ultrasound scan suggests the that the fluid may at least in part be within consolidated lung. The right effusion was clearly and not free flowing. The patient's prior CT demonstrated essentially clear lungs, performed 8 days previously.  EXAM: CT CHEST WITH CONTRAST  TECHNIQUE: Multidetector CT imaging of the chest was performed during intravenous contrast  administration.  CONTRAST:  80mL OMNIPAQUE IOHEXOL 300 MG/ML  SOLN  COMPARISON:  Current chest radiograph, 05/06/2013. Prior chest CT, 04/29/2013.  FINDINGS: There is loculated pleural fluid above the right hemidiaphragm along the right posterior lateral hemithorax extending to the level of the carina with minimal loculated pleural fluid noted in the right lateral upper hemithorax. The small mild loculated pleural fluid extends along the azygoesophageal recess. There is intervening collapsed/consolidated lung. Within the right posterior lateral lower hemi thorax pleural fluid there are bubbles of air.  On the left there is a small posterior pleural effusion with mild associated subsegmental dependent atelectasis.  There are few minor areas of reticular and focal ground-glass opacity in the upper lobes. There is no pulmonary edema.  There are prominent mediastinal lymph nodes. A right peritracheal node measures 11 mm in short axis. Above this there is an 8 mm node. There is soft tissue above the right pulmonary artery which appears to be a prominent azygos arch. The heart is normal in size. There is minimal pericardial fluid. The great vessels are normal in caliber.  A 15 mm low-density nodule projects along the posterior margin of the left thyroid lobe.  Limited evaluation of the upper abdomen shows fatty infiltration of the liver, borderline hepatomegaly and changes from a cholecystectomy.  There are degenerative changes of the visualized spine. No osteoblastic or osteolytic lesions.  IMPRESSION: 1. Loculated right pleural  effusion with associated atelectatic/ consolidated lung. This has a complex appearance. A few small bubbles of air within the presumed pleural fluid these is suspicion for a bronchopleural fistula. There is a small left pleural effusion with minor dependent subsegmental atelectasis. 2. Thoracentesis to obtain diagnostic fluid can be performed. Risks from this procedure are greater than for a typical free-flowing pleural effusion. Recommend consideration of placement of a pigtail drainage catheter within the largest loculation of pleural fluid to allow diagnostic acquisition of fluid and persistent drainage.   Electronically Signed   By: Amie Portland M.D.   On: 05/07/2013 15:58   [place] Chest  05/07/2013   CLINICAL DATA:  Patient presents for ultrasound-guided thoracentesis of the right chest for a new area of loculated pleural fluid on the right.  EXAM: CHEST ULTRASOUND  COMPARISON:  Chest radiograph, 05/06/2013  FINDINGS: There is a loculated area pleural fluid at the right lung base that is mostly surrounded by lung that has internal echoes consistent with consolidated lung. It is unclear whether this fluid resides within the lung, within the visceral pleura, or whether this is a loculated pleural effusion, or combination of both. For this reason, thoracentesis was not performed. The patient 1 ago a contrast enhanced chest CT for further delineation of this collection.  A small left pleural effusion was incidentally noted.  IMPRESSION: Loculated right pleural fluid versus an area of necrosis in consolidated right lung or combination of both.   Electronically Signed   By: Amie Portland M.D.   On: 05/07/2013 13:37   Dg Abd Acute W/chest  05/06/2013   CLINICAL DATA:  Chronic abdominal pain.  EXAM: ACUTE ABDOMEN SERIES (ABDOMEN 2 VIEW & CHEST 1 VIEW)  COMPARISON:  04/29/2013.  FINDINGS: The cardiac silhouette, mediastinal and hilar contours are within normal limits and stable There is a new probable loculated  pleural fluid collection on the right and a right lung infiltrate. This is unlikely a mass given the recent normal Chest x-ray and chest CT. The left lung is clear. The bony thorax is intact.  The  abdominal radiographs demonstrate an unremarkable bowel gas pattern. There is scattered contrast throughout the colon. The soft tissue shadows are maintained. The bony structures are intact. Stable vertebral augmentation changes at L3 and L4 new large probable loculated right pleural effusion and right lung infiltrate.  IMPRESSION: New probable loculated right pleural fluid collection and right lung infiltrate.  No acute abdominal findings. Contrast noted throughout the colon.   Electronically Signed   By: Loralie Champagne M.D.   On: 05/06/2013 19:46    Scheduled Meds: . ceFEPime (MAXIPIME) IV  1 g Intravenous Q8H  . DULoxetine  60 mg Oral Daily  . insulin aspart  0-15 Units Subcutaneous TID WC  . insulin aspart  0-5 Units Subcutaneous QHS  . insulin detemir  25 Units Subcutaneous QHS  . levalbuterol  0.63 mg Nebulization Q6H  . metronidazole  500 mg Intravenous Q8H  . sodium chloride  3 mL Intravenous Q12H  . vancomycin  1,000 mg Intravenous Q12H   Continuous Infusions: . 0.9 % NaCl with KCl 20 mEq / L 50 mL/hr at 05/08/13 0700    Principal Problem:   HCAP (healthcare-associated pneumonia) Active Problems:   Body aches   Diabetes mellitus type 2, uncontrolled   Pleural effusion, right   Fever   Hypokalemia    Time spent:55min    [facility]  [facility] Pager [phone]. If 7PM-7AM, please contact night-coverage at [email or web address], password [facility] 05/08/2013, 7:48 AM  LOS: 2 days

## 2013-05-08 NOTE — Progress Notes (Addendum)
Spoke with Dr Cornelius Moras who is on call. He will evaluate the patient in the AM. I will resume her diet tonight.  Gr pos cocci noted in one set of blood cultures- will repeat cultures in AM- cont current antibiotics.   Calvert Cantor, MD [phone]

## 2013-05-08 NOTE — Progress Notes (Signed)
Pt evaluated this morning soon after arrival from [facility]. Quite stable. Awaiting input from surgery. Will increase Morphine to assist in pain control as pt is still NPO for possible decortication.   Calvert Cantor, MD

## 2013-05-08 NOTE — Progress Notes (Signed)
Inpatient Diabetes Program Recommendations  AACE/ADA: New Consensus Statement on Inpatient Glycemic Control (2013)  Target Ranges:  Prepandial:   less than 140 mg/dL      Peak postprandial:   less than 180 mg/dL (1-2 hours)      Critically ill patients:  140 - 180 mg/dL   Results for LEONETTE, TISCHER (MRN [id number]) as of 05/08/2013 13:17  Ref. Range 05/07/2013 07:13 05/07/2013 11:53 05/07/2013 16:20 05/07/2013 21:44 05/08/2013 07:08 05/08/2013 09:20 05/08/2013 12:01  Glucose-Capillary Latest Range: 70-99 mg/dL 469 (H) 629 (H) 528 (H) 190 (H) 191 (H) 209 (H) 184 (H)   Inpatient Diabetes Program Recommendations Insulin - Basal: Please consider increasing Levemir to 27 units QHS. Correction (SSI): May want to consider increasing Novolog correction to resistant scale.  Note: Blood glucose ranged from 190-254 mg/dl on 41/32/44 and fasting glucose this morning is 191 mg/dl.  Noted patient is NPO and has been transferred from [facility] to [facility].  Please consider increasing Levemir to 27 units QHS and may want to increase Novolog correction to resistant scale.  Will continue to follow.  Thanks, Orlando Penner, RN, MSN, CCRN Diabetes Coordinator Inpatient Diabetes Program [phone] (Team Pager) [phone] ([place] office) [phone] [facility] office)

## 2013-05-08 NOTE — Consult Note (Signed)
Reason for Consult:Right parapneumonic effusion Referring Physician: Dr. Rizwan  Kelli Summers is an 57 y.o. female.  HPI: 57 yo WF presented with pleuritic right sided CP.  Kelli Summers is a 57 yo woman who recently returned from her 2nd trip to [place] in 2 months. She began feeling chilled a couple of days before coming home. She returned on October 29, She was admitted to [place] on 11/2 with general malaise, dehydration and severe hyperglycemia. She improved and was discharged after a couple of days. She then began feeliing worse and presente with fevers, chills and right sided pleuritic CP. She was readmitted. A CT of her chest now shows a large loculated right para-pneumonic effusion/ empyema.  Past Medical History  Diagnosis Date  . Diabetes mellitus   . History of stress test 01/2010    normal myoview stress test  . Chronic back pain   . Depression   . Chronic knee pain   . Hypertension     Past Surgical History  Procedure Laterality Date  . Back surgery    . Cholecystectomy    . Abdominal hysterectomy      History reviewed. No pertinent family history.  Social History:  reports that she has been smoking Cigarettes.  She has been smoking about 0.30 packs per day. She uses smokeless tobacco. She reports that she drinks alcohol. She reports that she uses illicit drugs (Marijuana) about twice per week.  Allergies:  Allergies  Allergen Reactions  . Ciprofloxacin Itching  . Hydrocodone-Acetaminophen Itching  . Penicillins Itching  . Percocet [Oxycodone-Acetaminophen] Itching    Medications: I have reviewed the patient's current medications. 0.9 % NaCl with KCl 20 mEq/ L infusion acetaminophen (TYLENOL) tablet 650 mg ceFEPIme (MAXIPIME) 1 g in dextrose 5 % 50 mL IVPB diazepam (VALIUM) tablet 5 mg DULoxetine (CYMBALTA) DR capsule 60 mg insulin aspart (novoLOG) injection 0-15 Units insulin aspart (novoLOG) injection 0-5 Units insulin detemir (LEVEMIR) injection 25 Units  levalbuterol (XOPENEX) nebulizer solution 0.63 mg metroNIDAZOLE (FLAGYL) IVPB 500 mg morphine 2 MG/ML injection 2 mg ondansetron (ZOFRAN) injection 4 mg ondansetron (ZOFRAN) tablet 4 mg oxyCODONE-acetaminophen (PERCOCET/ROXICET) 5-325 MG per tablet 1 tablet sodium chloride 0.9 % injection 3 mL vancomycin (VANCOCIN) IVPB 1000 mg/200 mL premix   Results for orders placed during the hospital encounter of 05/06/13 (from the past 48 hour(s))  LACTIC ACID, PLASMA     Status: None   Collection Time    05/06/13  8:37 PM      Result Value Range   Lactic Acid, Venous 1.8  0.5 - 2.2 mmol/L  LIPASE, BLOOD     Status: Abnormal   Collection Time    05/06/13  8:37 PM      Result Value Range   Lipase 10 (*) 11 - 59 U/L  GLUCOSE, CAPILLARY     Status: Abnormal   Collection Time    05/06/13 10:58 PM      Result Value Range   Glucose-Capillary 237 (*) 70 - 99 mg/dL   Comment 1 Notify RN    CULTURE, BLOOD (ROUTINE X 2)     Status: None   Collection Time    05/06/13 11:38 PM      Result Value Range   Specimen Description Blood BLOOD RIGHT HAND     Special Requests BOTTLES DRAWN AEROBIC AND ANAEROBIC 5CC     Culture       Value: GRAM POSITIVE COCCI IN CLUSTERS     Gram Stain Report Called to,Read   Back By and Verified With: J. BRODY AT [facility] AT 1707 ON 05/08/13 BY S. VANHOORNE   Report Status PENDING    CULTURE, BLOOD (ROUTINE X 2)     Status: None   Collection Time    05/06/13 11:47 PM      Result Value Range   Specimen Description Blood RIGHT ANTECUBITAL     Special Requests BOTTLES DRAWN AEROBIC AND ANAEROBIC 8CC     Culture NO GROWTH 1 DAY     Report Status PENDING    LEGIONELLA ANTIGEN, URINE     Status: None   Collection Time    05/07/13  2:07 AM      Result Value Range   Specimen Description URINE, CLEAN CATCH     Special Requests NONE     Legionella Antigen, Urine       Value: Negative for Legionella pneumophilia serogroup 1     Performed at [facility]   Report Status  05/08/2013 FINAL    STREP PNEUMONIAE URINARY ANTIGEN     Status: None   Collection Time    05/07/13  2:07 AM      Result Value Range   Strep Pneumo Urinary Antigen NEGATIVE  NEGATIVE   Comment:            Infection due to S. pneumoniae     cannot be absolutely ruled out     since the antigen present     may be below the detection limit     of the test.     Performed at [facility]  URINALYSIS, ROUTINE W REFLEX MICROSCOPIC     Status: Abnormal   Collection Time    05/07/13  2:07 AM      Result Value Range   Color, Urine YELLOW  YELLOW   APPearance HAZY (*) CLEAR   Specific Gravity, Urine 1.025  1.005 - 1.030   pH 6.0  5.0 - 8.0   Glucose, UA 250 (*) NEGATIVE mg/dL   Hgb urine dipstick MODERATE (*) NEGATIVE   Bilirubin Urine NEGATIVE  NEGATIVE   Ketones, ur 15 (*) NEGATIVE mg/dL   Protein, ur 100 (*) NEGATIVE mg/dL   Urobilinogen, UA 1.0  0.0 - 1.0 mg/dL   Nitrite POSITIVE (*) NEGATIVE   Leukocytes, UA SMALL (*) NEGATIVE  URINE MICROSCOPIC-ADD ON     Status: Abnormal   Collection Time    05/07/13  2:07 AM      Result Value Range   Squamous Epithelial / LPF MANY (*) RARE   WBC, UA 7-10  <3 WBC/hpf   RBC / HPF 7-10  <3 RBC/hpf   Bacteria, UA MANY (*) RARE   Casts GRANULAR CAST (*) NEGATIVE  URINE CULTURE     Status: None   Collection Time    05/07/13  2:07 AM      Result Value Range   Specimen Description URINE, CLEAN CATCH     Special Requests NONE     Culture  Setup Time       Value: 05/07/2013 03:40     Performed at [facility]   Culture       Value: Culture reincubated for better growth     Performed at [facility]   Report Status PENDING    COMPREHENSIVE METABOLIC PANEL     Status: Abnormal   Collection Time    05/07/13  5:07 AM      Result Value Range   Sodium 133 (*) 135 -   145 mEq/L   Potassium 3.5  3.5 - 5.1 mEq/L   Comment: DELTA CHECK NOTED   Chloride 102  96 - 112 mEq/L   CO2 20  19 - 32 mEq/L   Glucose, Bld 293 (*) 70 - 99  mg/dL   BUN 16  6 - 23 mg/dL   Creatinine, Ser 1.05  0.50 - 1.10 mg/dL   Calcium 8.4  8.4 - 10.5 mg/dL   Total Protein 5.5 (*) 6.0 - 8.3 g/dL   Albumin 1.5 (*) 3.5 - 5.2 g/dL   AST 18  0 - 37 U/L   ALT 16  0 - 35 U/L   Alkaline Phosphatase 215 (*) 39 - 117 U/L   Total Bilirubin 0.6  0.3 - 1.2 mg/dL   GFR calc non Af Amer 58 (*) >90 mL/min   GFR calc Af Amer 67 (*) >90 mL/min   Comment: (NOTE)     The eGFR has been calculated using the CKD EPI equation.     This calculation has not been validated in all clinical situations.     eGFR's persistently <90 mL/min signify possible Chronic Kidney     Disease.  CBC     Status: Abnormal   Collection Time    05/07/13  5:07 AM      Result Value Range   WBC 19.5 (*) 4.0 - 10.5 K/uL   RBC 3.31 (*) 3.87 - 5.11 MIL/uL   Hemoglobin 9.8 (*) 12.0 - 15.0 g/dL   HCT 29.0 (*) 36.0 - 46.0 %   MCV 87.6  78.0 - 100.0 fL   MCH 29.6  26.0 - 34.0 pg   MCHC 33.8  30.0 - 36.0 g/dL   RDW 14.0  11.5 - 15.5 %   Platelets 196  150 - 400 K/uL  PROTIME-INR     Status: None   Collection Time    05/07/13  5:07 AM      Result Value Range   Prothrombin Time 14.1  11.6 - 15.2 seconds   INR 1.11  0.00 - 1.49  APTT     Status: None   Collection Time    05/07/13  5:07 AM      Result Value Range   aPTT 36  24 - 37 seconds  LACTATE DEHYDROGENASE     Status: None   Collection Time    05/07/13  5:07 AM      Result Value Range   LDH 209  94 - 250 U/L  GLUCOSE, CAPILLARY     Status: Abnormal   Collection Time    05/07/13  7:13 AM      Result Value Range   Glucose-Capillary 254 (*) 70 - 99 mg/dL   Comment 1 Notify RN    GLUCOSE, CAPILLARY     Status: Abnormal   Collection Time    05/07/13 11:53 AM      Result Value Range   Glucose-Capillary 212 (*) 70 - 99 mg/dL   Comment 1 Notify RN    GLUCOSE, CAPILLARY     Status: Abnormal   Collection Time    05/07/13  4:20 PM      Result Value Range   Glucose-Capillary 204 (*) 70 - 99 mg/dL   Comment 1 Notify RN     GLUCOSE, CAPILLARY     Status: Abnormal   Collection Time    05/07/13  9:44 PM      Result Value Range   Glucose-Capillary 190 (*) 70 - 99 mg/dL     Comment 1 Notify RN    GLUCOSE, CAPILLARY     Status: Abnormal   Collection Time    05/08/13  7:08 AM      Result Value Range   Glucose-Capillary 191 (*) 70 - 99 mg/dL   Comment 1 Documented in Chart     Comment 2 Notify RN    CBC     Status: Abnormal   Collection Time    05/08/13  8:17 AM      Result Value Range   WBC 18.2 (*) 4.0 - 10.5 K/uL   RBC 3.16 (*) 3.87 - 5.11 MIL/uL   Hemoglobin 9.4 (*) 12.0 - 15.0 g/dL   HCT 27.9 (*) 36.0 - 46.0 %   MCV 88.3  78.0 - 100.0 fL   MCH 29.7  26.0 - 34.0 pg   MCHC 33.7  30.0 - 36.0 g/dL   RDW 14.3  11.5 - 15.5 %   Platelets 212  150 - 400 K/uL  BASIC METABOLIC PANEL     Status: Abnormal   Collection Time    05/08/13  8:17 AM      Result Value Range   Sodium 132 (*) 135 - 145 mEq/L   Potassium 3.2 (*) 3.5 - 5.1 mEq/L   Chloride 101  96 - 112 mEq/L   CO2 21  19 - 32 mEq/L   Glucose, Bld 240 (*) 70 - 99 mg/dL   BUN 20  6 - 23 mg/dL   Creatinine, Ser 1.01  0.50 - 1.10 mg/dL   Calcium 8.6  8.4 - 10.5 mg/dL   GFR calc non Af Amer 61 (*) >90 mL/min   GFR calc Af Amer 70 (*) >90 mL/min   Comment: (NOTE)     The eGFR has been calculated using the CKD EPI equation.     This calculation has not been validated in all clinical situations.     eGFR's persistently <90 mL/min signify possible Chronic Kidney     Disease.  GLUCOSE, CAPILLARY     Status: Abnormal   Collection Time    05/08/13  9:20 AM      Result Value Range   Glucose-Capillary 209 (*) 70 - 99 mg/dL  GLUCOSE, CAPILLARY     Status: Abnormal   Collection Time    05/08/13 12:01 PM      Result Value Range   Glucose-Capillary 184 (*) 70 - 99 mg/dL  STREP PNEUMONIAE URINARY ANTIGEN     Status: None   Collection Time    05/08/13  1:16 PM      Result Value Range   Strep Pneumo Urinary Antigen NEGATIVE  NEGATIVE   Comment:             Infection due to S. pneumoniae     cannot be absolutely ruled out     since the antigen present     may be below the detection limit     of the test.  GLUCOSE, CAPILLARY     Status: Abnormal   Collection Time    05/08/13  4:38 PM      Result Value Range   Glucose-Capillary 249 (*) 70 - 99 mg/dL    Dg Lumbar Spine Complete  05/06/2013   CLINICAL DATA:  Chronic low back pain  EXAM: LUMBAR SPINE - COMPLETE 4+ VIEW  COMPARISON:  Prior CT from 04/29/2013  FINDINGS: Overall alignment is stable as compared to the prior examination with trace retrolisthesis of L3 on L4. Sequelae of prior vertebral   augmentation are again seen at L3 and L4, unchanged. Vertebral body height loss T11, T12, L3, and L4 levels is not significantly changed. Multilevel degenerative disc disease and facet arthrosis is grossly similar, most severe at L2-3. No acute fracture listhesis. Diffuse osteopenia is present.  Paravertebral soft tissues are within normal limits. Prominent atherosclerotic calcifications noted within the infrarenal aorta.  IMPRESSION: 1. No acute osseous abnormality identified within the lumbar spine. 2. Stable appearance of the lumbar spine with prior vertebral augmentation at L3 and L4 with multilevel compression deformities and degenerative disc disease. .   Electronically Signed   By: Benjamin  McClintock M.D.   On: 05/06/2013 22:44   Ct Chest W Contrast  05/07/2013   CLINICAL DATA:  Patient with a loculated effusion. She was to have a thoracentesis today he, but the initial ultrasound scan suggests the that the fluid may at least in part be within consolidated lung. The right effusion was clearly and not free flowing. The patient's prior CT demonstrated essentially clear lungs, performed 8 days previously.  EXAM: CT CHEST WITH CONTRAST  TECHNIQUE: Multidetector CT imaging of the chest was performed during intravenous contrast administration.  CONTRAST:  80mL OMNIPAQUE IOHEXOL 300 MG/ML  SOLN  COMPARISON:   Current chest radiograph, 05/06/2013. Prior chest CT, 04/29/2013.  FINDINGS: There is loculated pleural fluid above the right hemidiaphragm along the right posterior lateral hemithorax extending to the level of the carina with minimal loculated pleural fluid noted in the right lateral upper hemithorax. The small mild loculated pleural fluid extends along the azygoesophageal recess. There is intervening collapsed/consolidated lung. Within the right posterior lateral lower hemi thorax pleural fluid there are bubbles of air.  On the left there is a small posterior pleural effusion with mild associated subsegmental dependent atelectasis.  There are few minor areas of reticular and focal ground-glass opacity in the upper lobes. There is no pulmonary edema.  There are prominent mediastinal lymph nodes. A right peritracheal node measures 11 mm in short axis. Above this there is an 8 mm node. There is soft tissue above the right pulmonary artery which appears to be a prominent azygos arch. The heart is normal in size. There is minimal pericardial fluid. The great vessels are normal in caliber.  A 15 mm low-density nodule projects along the posterior margin of the left thyroid lobe.  Limited evaluation of the upper abdomen shows fatty infiltration of the liver, borderline hepatomegaly and changes from a cholecystectomy.  There are degenerative changes of the visualized spine. No osteoblastic or osteolytic lesions.  IMPRESSION: 1. Loculated right pleural effusion with associated atelectatic/ consolidated lung. This has a complex appearance. A few small bubbles of air within the presumed pleural fluid these is suspicion for a bronchopleural fistula. There is a small left pleural effusion with minor dependent subsegmental atelectasis. 2. Thoracentesis to obtain diagnostic fluid can be performed. Risks from this procedure are greater than for a typical free-flowing pleural effusion. Recommend consideration of placement of a  pigtail drainage catheter within the largest loculation of pleural fluid to allow diagnostic acquisition of fluid and persistent drainage.   Electronically Signed   By: David  Ormond M.D.   On: 05/07/2013 15:58   Us Chest  05/07/2013   CLINICAL DATA:  Patient presents for ultrasound-guided thoracentesis of the right chest for a new area of loculated pleural fluid on the right.  EXAM: CHEST ULTRASOUND  COMPARISON:  Chest radiograph, 05/06/2013  FINDINGS: There is a loculated area pleural fluid at the right   lung base that is mostly surrounded by lung that has internal echoes consistent with consolidated lung. It is unclear whether this fluid resides within the lung, within the visceral pleura, or whether this is a loculated pleural effusion, or combination of both. For this reason, thoracentesis was not performed. The patient 1 ago a contrast enhanced chest CT for further delineation of this collection.  A small left pleural effusion was incidentally noted.  IMPRESSION: Loculated right pleural fluid versus an area of necrosis in consolidated right lung or combination of both.   Electronically Signed   By: David  Ormond M.D.   On: 05/07/2013 13:37   Dg Abd Acute W/chest  05/06/2013   CLINICAL DATA:  Chronic abdominal pain.  EXAM: ACUTE ABDOMEN SERIES (ABDOMEN 2 VIEW & CHEST 1 VIEW)  COMPARISON:  04/29/2013.  FINDINGS: The cardiac silhouette, mediastinal and hilar contours are within normal limits and stable There is a new probable loculated pleural fluid collection on the right and a right lung infiltrate. This is unlikely a mass given the recent normal Chest x-ray and chest CT. The left lung is clear. The bony thorax is intact.  The abdominal radiographs demonstrate an unremarkable bowel gas pattern. There is scattered contrast throughout the colon. The soft tissue shadows are maintained. The bony structures are intact. Stable vertebral augmentation changes at L3 and L4 new large probable loculated right  pleural effusion and right lung infiltrate.  IMPRESSION: New probable loculated right pleural fluid collection and right lung infiltrate.  No acute abdominal findings. Contrast noted throughout the colon.   Electronically Signed   By: Mark  Gallerani M.D.   On: 05/06/2013 19:46    Review of Systems  Constitutional: Positive for fever, chills and malaise/fatigue.  Respiratory: Positive for cough, sputum production and shortness of breath. Negative for hemoptysis.        Right sided pleuritic CP  Musculoskeletal: Positive for joint pain and myalgias.  Neurological: Positive for weakness.  All other systems reviewed and are negative.   Blood pressure 134/67, pulse 128, temperature 99.8 F (37.7 C), temperature source Oral, resp. rate 16, height 5' 5" (1.651 m), weight 202 lb 2.6 oz (91.7 kg), SpO2 100.00%. Physical Exam  Vitals reviewed. Constitutional: No distress.  Somnolent, obese  HENT:  Head: Normocephalic and atraumatic.  Eyes: Pupils are equal, round, and reactive to light.  Neck: No JVD present. No thyromegaly present.  Cardiovascular: Normal rate, regular rhythm and normal heart sounds.  Exam reveals no gallop and no friction rub.   No murmur heard. Respiratory:  Absent BS right base  GI: Soft. There is no tenderness.  Musculoskeletal: She exhibits no edema.  Lymphadenopathy:    She has no cervical adenopathy.  Neurological: No cranial nerve deficit.  No focal motor deficit    Assessment/Plan: 57 yo WF with a pneumonia and a large right sided para-pneumonic effusion/ empyema. She needs surgical drainage to clear the pleural space and reexpand the lung.  I discussed with the patient and her family the general nature of the procedure, the need for general anesthesia, and the incisions to be used. We discussed the expected hospital stay, overall recovery and short and long term outcomes. They understand the risks include but are not limited to death, stroke, MI, DVT/PE,  bleeding, possible need for transfusion, infections, prolonged air leaks, and other organ system dysfunction including respiratory, renal, or GI complications. She accepts these risks and agrees to proceed.  For OR in AM  Ermagene Saidi C 05/08/2013, 6:42 PM      

## 2013-05-09 ENCOUNTER — Inpatient Hospital Stay (HOSPITAL_COMMUNITY): Payer: 59

## 2013-05-09 ENCOUNTER — Inpatient Hospital Stay (HOSPITAL_COMMUNITY): Payer: 59 | Admitting: Anesthesiology

## 2013-05-09 ENCOUNTER — Encounter (HOSPITAL_COMMUNITY)
Admission: EM | Disposition: A | Discharge status: Home / Self Care | Payer: Self-pay | Source: Home / Self Care | Attending: Critical Care Medicine

## 2013-05-09 ENCOUNTER — Encounter (HOSPITAL_COMMUNITY): Payer: 59 | Admitting: Anesthesiology

## 2013-05-09 ENCOUNTER — Encounter (HOSPITAL_COMMUNITY): Payer: Self-pay | Admitting: Anesthesiology

## 2013-05-09 DIAGNOSIS — J9600 Acute respiratory failure, unspecified whether with hypoxia or hypercapnia: Secondary | ICD-10-CM

## 2013-05-09 DIAGNOSIS — J869 Pyothorax without fistula: Secondary | ICD-10-CM

## 2013-05-09 DIAGNOSIS — J9601 Acute respiratory failure with hypoxia: Secondary | ICD-10-CM | POA: Diagnosis present

## 2013-05-09 HISTORY — PX: WEDGE RESECTION: SHX5070

## 2013-05-09 HISTORY — PX: VIDEO ASSISTED THORACOSCOPY (VATS)/DECORTICATION: SHX6171

## 2013-05-09 LAB — CBC
HCT: 24.6 % — ABNORMAL LOW (ref 36.0–46.0)
Hemoglobin: 8.2 g/dL — ABNORMAL LOW (ref 12.0–15.0)
MCH: 29.6 pg (ref 26.0–34.0)
MCHC: 33.3 g/dL (ref 30.0–36.0)
MCV: 88.8 fL (ref 78.0–100.0)
Platelets: 222 10*3/uL (ref 150–400)
RBC: 2.77 MIL/uL — ABNORMAL LOW (ref 3.87–5.11)
RDW: 14.5 % (ref 11.5–15.5)
WBC: 20.3 10*3/uL — ABNORMAL HIGH (ref 4.0–10.5)

## 2013-05-09 LAB — POCT I-STAT 3, ART BLOOD GAS (G3+)
Acid-base deficit: 5 mmol/L — ABNORMAL HIGH (ref 0.0–2.0)
Acid-base deficit: 2 mmol/L (ref 0.0–2.0)
Bicarbonate: 21 mEq/L (ref 20.0–24.0)
Bicarbonate: 23.8 mEq/L (ref 20.0–24.0)
O2 Saturation: 91 %
O2 Saturation: 96 %
Patient temperature: 97
Patient temperature: 98.9
TCO2: 22 mmol/L (ref 0–100)
TCO2: 25 mmol/L (ref 0–100)
pCO2 arterial: 40.5 mmHg (ref 35.0–45.0)
pCO2 arterial: 43.2 mmHg (ref 35.0–45.0)
pH, Arterial: 7.317 — ABNORMAL LOW (ref 7.350–7.450)
pH, Arterial: 7.35 (ref 7.350–7.450)
pO2, Arterial: 62 mmHg — ABNORMAL LOW (ref 80.0–100.0)
pO2, Arterial: 86 mmHg (ref 80.0–100.0)

## 2013-05-09 LAB — BASIC METABOLIC PANEL
BUN: 22 mg/dL (ref 6–23)
CO2: 20 mEq/L (ref 19–32)
Calcium: 8.1 mg/dL — ABNORMAL LOW (ref 8.4–10.5)
Chloride: 99 mEq/L (ref 96–112)
Creatinine, Ser: 1.05 mg/dL (ref 0.50–1.10)
GFR calc Af Amer: 67 mL/min — ABNORMAL LOW (ref >90)
GFR calc non Af Amer: 58 mL/min — ABNORMAL LOW (ref >90)
Glucose, Bld: 335 mg/dL — ABNORMAL HIGH (ref 70–99)
Potassium: 4.2 mEq/L (ref 3.5–5.1)
Sodium: 130 mEq/L — ABNORMAL LOW (ref 135–145)

## 2013-05-09 LAB — GLUCOSE, CAPILLARY
Glucose-Capillary: 239 mg/dL — ABNORMAL HIGH (ref 70–99)
Glucose-Capillary: 335 mg/dL — ABNORMAL HIGH (ref 70–99)
Glucose-Capillary: 284 mg/dL — ABNORMAL HIGH (ref 70–99)
Glucose-Capillary: 257 mg/dL — ABNORMAL HIGH (ref 70–99)
Glucose-Capillary: 249 mg/dL — ABNORMAL HIGH (ref 70–99)

## 2013-05-09 LAB — LEGIONELLA ANTIGEN, URINE: Legionella Antigen, Urine: NEGATIVE

## 2013-05-09 SURGERY — VIDEO ASSISTED THORACOSCOPY (VATS)/DECORTICATION
Anesthesia: General | Site: Chest | Laterality: Right | Wound class: Dirty or Infected

## 2013-05-09 MED ORDER — ONDANSETRON HCL 4 MG/2ML IJ SOLN
4.0000 mg | Freq: Four times a day (QID) | INTRAMUSCULAR | Status: DC | PRN
  Filled 2013-05-09: qty 2

## 2013-05-09 MED ORDER — ALBUMIN HUMAN 5 % IV SOLN
INTRAVENOUS | Status: DC | PRN
  Administered 2013-05-09: 11:00:00 via INTRAVENOUS

## 2013-05-09 MED ORDER — MIDAZOLAM HCL 5 MG/5ML IJ SOLN
INTRAMUSCULAR | Status: DC | PRN
  Administered 2013-05-09: 2 mg via INTRAVENOUS

## 2013-05-09 MED ORDER — LEVALBUTEROL HCL 0.63 MG/3ML IN NEBU
0.6300 mg | INHALATION_SOLUTION | Freq: Four times a day (QID) | RESPIRATORY_TRACT | Status: DC
  Administered 2013-05-09 – 2013-05-16 (×22): 0.63 mg via RESPIRATORY_TRACT
  Filled 2013-05-09 (×46): qty 3

## 2013-05-09 MED ORDER — FENTANYL CITRATE 0.05 MG/ML IJ SOLN
INTRAMUSCULAR | Status: DC | PRN
  Administered 2013-05-09: 100 ug via INTRAVENOUS
  Administered 2013-05-09: 250 ug via INTRAVENOUS

## 2013-05-09 MED ORDER — INSULIN ASPART 100 UNIT/ML ~~LOC~~ SOLN
SUBCUTANEOUS | Status: AC
  Administered 2013-05-09: 11 [IU] via SUBCUTANEOUS
  Filled 2013-05-09: qty 11

## 2013-05-09 MED ORDER — DIPHENHYDRAMINE HCL 12.5 MG/5ML PO ELIX
12.5000 mg | ORAL_SOLUTION | Freq: Four times a day (QID) | ORAL | Status: DC | PRN
  Filled 2013-05-09: qty 5

## 2013-05-09 MED ORDER — NALOXONE HCL 0.4 MG/ML IJ SOLN
0.4000 mg | INTRAMUSCULAR | Status: DC | PRN
  Administered 2013-05-09 (×2): 0.4 mg via INTRAVENOUS
  Filled 2013-05-09 (×2): qty 1

## 2013-05-09 MED ORDER — 0.9 % SODIUM CHLORIDE (POUR BTL) OPTIME
TOPICAL | Status: DC | PRN
  Administered 2013-05-09: 1000 mL

## 2013-05-09 MED ORDER — POTASSIUM CHLORIDE 10 MEQ/50ML IV SOLN
10.0000 meq | Freq: Every day | INTRAVENOUS | Status: DC | PRN
  Filled 2013-05-09: qty 50

## 2013-05-09 MED ORDER — OXYCODONE HCL 5 MG PO TABS
5.0000 mg | ORAL_TABLET | Freq: Once | ORAL | Status: DC | PRN
Start: 2013-05-09 — End: 2013-05-09

## 2013-05-09 MED ORDER — HYDROMORPHONE HCL PF 1 MG/ML IJ SOLN
0.2500 mg | INTRAMUSCULAR | Status: DC | PRN
  Administered 2013-05-09: 0.25 mg via INTRAVENOUS

## 2013-05-09 MED ORDER — HEMOSTATIC AGENTS (NO CHARGE) OPTIME
TOPICAL | Status: DC | PRN
  Administered 2013-05-09: 1 via TOPICAL

## 2013-05-09 MED ORDER — ROCURONIUM BROMIDE 100 MG/10ML IV SOLN
INTRAVENOUS | Status: DC | PRN
  Administered 2013-05-09: 50 mg via INTRAVENOUS

## 2013-05-09 MED ORDER — LIDOCAINE HCL (CARDIAC) 20 MG/ML IV SOLN
INTRAVENOUS | Status: DC | PRN
  Administered 2013-05-09: 100 mg via INTRAVENOUS

## 2013-05-09 MED ORDER — POTASSIUM CHLORIDE IN NACL 20-0.45 MEQ/L-% IV SOLN
INTRAVENOUS | Status: DC
  Administered 2013-05-09: 17:00:00 via INTRAVENOUS
  Administered 2013-05-10: 50 mL/h via INTRAVENOUS
  Administered 2013-05-11: 100 mL/h via INTRAVENOUS
  Filled 2013-05-09 (×7): qty 1000

## 2013-05-09 MED ORDER — BISACODYL 5 MG PO TBEC
10.0000 mg | DELAYED_RELEASE_TABLET | Freq: Every day | ORAL | Status: DC
  Administered 2013-05-11 – 2013-05-18 (×5): 10 mg via ORAL
  Filled 2013-05-09 (×8): qty 2

## 2013-05-09 MED ORDER — OXYCODONE-ACETAMINOPHEN 5-325 MG PO TABS: 1.0000 | ORAL_TABLET | ORAL | Status: DC | PRN

## 2013-05-09 MED ORDER — NALOXONE HCL 0.4 MG/ML IJ SOLN: 0.4000 mg | INTRAMUSCULAR | Status: DC | PRN

## 2013-05-09 MED ORDER — DIPHENHYDRAMINE HCL 50 MG/ML IJ SOLN
12.5000 mg | Freq: Four times a day (QID) | INTRAMUSCULAR | Status: DC | PRN
  Filled 2013-05-09: qty 0.25

## 2013-05-09 MED ORDER — EPHEDRINE SULFATE 50 MG/ML IJ SOLN
INTRAMUSCULAR | Status: DC | PRN
  Administered 2013-05-09: 15 mg via INTRAVENOUS

## 2013-05-09 MED ORDER — LACTATED RINGERS IV SOLN
INTRAVENOUS | Status: DC | PRN
  Administered 2013-05-09 (×3): via INTRAVENOUS

## 2013-05-09 MED ORDER — ALBUTEROL SULFATE HFA 108 (90 BASE) MCG/ACT IN AERS
INHALATION_SPRAY | RESPIRATORY_TRACT | Status: DC | PRN
  Administered 2013-05-09 (×2): 2 via RESPIRATORY_TRACT

## 2013-05-09 MED ORDER — VANCOMYCIN HCL IN DEXTROSE 1-5 GM/200ML-% IV SOLN
INTRAVENOUS | Status: AC
  Filled 2013-05-09: qty 200

## 2013-05-09 MED ORDER — METOCLOPRAMIDE HCL 5 MG/ML IJ SOLN: 10.0000 mg | Freq: Once | INTRAMUSCULAR | Status: DC | PRN

## 2013-05-09 MED ORDER — ONDANSETRON HCL 4 MG/2ML IJ SOLN
INTRAMUSCULAR | Status: DC | PRN
  Administered 2013-05-09: 4 mg via INTRAVENOUS

## 2013-05-09 MED ORDER — DIPHENHYDRAMINE HCL 50 MG/ML IJ SOLN: 12.5000 mg | Freq: Four times a day (QID) | INTRAMUSCULAR | Status: DC | PRN

## 2013-05-09 MED ORDER — SODIUM CHLORIDE 0.9 % IJ SOLN: 9.0000 mL | INTRAMUSCULAR | Status: DC | PRN

## 2013-05-09 MED ORDER — ARTIFICIAL TEARS OP OINT
TOPICAL_OINTMENT | OPHTHALMIC | Status: DC | PRN
  Administered 2013-05-09: 1 via OPHTHALMIC

## 2013-05-09 MED ORDER — ACETAMINOPHEN 160 MG/5ML PO SOLN: 1000.0000 mg | Freq: Four times a day (QID) | ORAL | Status: AC

## 2013-05-09 MED ORDER — ACETAMINOPHEN 500 MG PO TABS
1000.0000 mg | ORAL_TABLET | Freq: Four times a day (QID) | ORAL | Status: AC
  Administered 2013-05-10: 1000 mg via ORAL
  Filled 2013-05-09: qty 2

## 2013-05-09 MED ORDER — PHENYLEPHRINE HCL 10 MG/ML IJ SOLN
10.0000 mg | INTRAVENOUS | Status: DC | PRN
  Administered 2013-05-09: 15 ug/min via INTRAVENOUS

## 2013-05-09 MED ORDER — ONDANSETRON HCL 4 MG/2ML IJ SOLN: 4.0000 mg | Freq: Four times a day (QID) | INTRAMUSCULAR | Status: DC | PRN

## 2013-05-09 MED ORDER — NALOXONE HCL 0.4 MG/ML IJ SOLN
INTRAMUSCULAR | Status: DC | PRN
  Administered 2013-05-09 (×2): 40 ug via INTRAVENOUS

## 2013-05-09 MED ORDER — NEOSTIGMINE METHYLSULFATE 1 MG/ML IJ SOLN
INTRAMUSCULAR | Status: DC | PRN
  Administered 2013-05-09: 3 mg via INTRAVENOUS

## 2013-05-09 MED ORDER — NALOXONE HCL 0.4 MG/ML IJ SOLN
INTRAMUSCULAR | Status: AC
  Administered 2013-05-09: 0.4 mg via INTRAVENOUS
  Filled 2013-05-09: qty 1

## 2013-05-09 MED ORDER — GLYCOPYRROLATE 0.2 MG/ML IJ SOLN
INTRAMUSCULAR | Status: DC | PRN
  Administered 2013-05-09: .4 mg via INTRAVENOUS

## 2013-05-09 MED ORDER — HYDROMORPHONE HCL PF 1 MG/ML IJ SOLN
INTRAMUSCULAR | Status: AC
  Filled 2013-05-09: qty 1

## 2013-05-09 MED ORDER — PHENYLEPHRINE HCL 10 MG/ML IJ SOLN
INTRAMUSCULAR | Status: DC | PRN
  Administered 2013-05-09 (×3): 200 ug via INTRAVENOUS

## 2013-05-09 MED ORDER — OXYCODONE HCL 5 MG/5ML PO SOLN: 5.0000 mg | Freq: Once | ORAL | Status: DC | PRN

## 2013-05-09 MED ORDER — ALBUMIN HUMAN 5 % IV SOLN
12.5000 g | Freq: Once | INTRAVENOUS | Status: AC
  Administered 2013-05-09: 12.5 g via INTRAVENOUS
  Filled 2013-05-09: qty 250

## 2013-05-09 MED ORDER — OXYCODONE HCL 5 MG PO TABS: 5.0000 mg | ORAL_TABLET | ORAL | Status: DC | PRN

## 2013-05-09 MED ORDER — PROPOFOL 10 MG/ML IV BOLUS
INTRAVENOUS | Status: DC | PRN
  Administered 2013-05-09: 150 mg via INTRAVENOUS

## 2013-05-09 MED ORDER — FENTANYL 10 MCG/ML IV SOLN
INTRAVENOUS | Status: DC
  Administered 2013-05-10 (×2): 60 ug via INTRAVENOUS
  Administered 2013-05-10 (×2): 30 ug via INTRAVENOUS
  Administered 2013-05-10: 45 ug via INTRAVENOUS
  Administered 2013-05-10: 30 ug via INTRAVENOUS
  Administered 2013-05-11: 15 ug via INTRAVENOUS
  Administered 2013-05-11: 60 ug via INTRAVENOUS
  Filled 2013-05-09: qty 50

## 2013-05-09 MED ORDER — FENTANYL 10 MCG/ML IV SOLN
INTRAVENOUS | Status: DC
  Administered 2013-05-09: 14:00:00 via INTRAVENOUS
  Filled 2013-05-09 (×2): qty 50

## 2013-05-09 SURGICAL SUPPLY — 72 items
APPLICATOR TIP EXT COSEAL (VASCULAR PRODUCTS) IMPLANT
CANISTER SUCTION 2500CC (MISCELLANEOUS) ×2 IMPLANT
CATH KIT ON Q 5IN SLV (PAIN MANAGEMENT) IMPLANT
CATH THORACIC 28FR (CATHETERS) IMPLANT
CATH THORACIC 28FR RT ANG (CATHETERS) IMPLANT
CATH THORACIC 36FR (CATHETERS) IMPLANT
CATH THORACIC 36FR RT ANG (CATHETERS) ×2 IMPLANT
CLIP TI MEDIUM 6 (CLIP) ×2 IMPLANT
CONN Y 3/8X3/8X3/8  BEN (MISCELLANEOUS)
CONN Y 3/8X3/8X3/8 BEN (MISCELLANEOUS) IMPLANT
CONT SPEC 4OZ CLIKSEAL STRL BL (MISCELLANEOUS) ×16 IMPLANT
COVER SURGICAL LIGHT HANDLE (MISCELLANEOUS) ×2 IMPLANT
DERMABOND ADVANCED (GAUZE/BANDAGES/DRESSINGS) ×1
DERMABOND ADVANCED .7 DNX12 (GAUZE/BANDAGES/DRESSINGS) ×1 IMPLANT
DRAPE LAPAROSCOPIC ABDOMINAL (DRAPES) ×2 IMPLANT
DRAPE WARM FLUID 44X44 (DRAPE) ×4 IMPLANT
ELECT REM PT RETURN 9FT ADLT (ELECTROSURGICAL) ×2
ELECTRODE REM PT RTRN 9FT ADLT (ELECTROSURGICAL) ×1 IMPLANT
GLOVE BIOGEL M 6.5 STRL (GLOVE) ×2 IMPLANT
GLOVE BIOGEL M STER SZ 6 (GLOVE) ×2 IMPLANT
GLOVE BIOGEL PI IND STRL 6.5 (GLOVE) ×1 IMPLANT
GLOVE BIOGEL PI IND STRL 7.0 (GLOVE) ×2 IMPLANT
GLOVE BIOGEL PI INDICATOR 6.5 (GLOVE) ×1
GLOVE BIOGEL PI INDICATOR 7.0 (GLOVE) ×2
GLOVE SURG SIGNA 7.5 PF LTX (GLOVE) ×4 IMPLANT
GOWN PREVENTION PLUS XLARGE (GOWN DISPOSABLE) ×2 IMPLANT
GOWN STRL NON-REIN LRG LVL3 (GOWN DISPOSABLE) ×6 IMPLANT
HANDLE STAPLE ENDO GIA SHORT (STAPLE) ×1
HEMOSTAT SURGICEL 2X14 (HEMOSTASIS) IMPLANT
KIT BASIN OR (CUSTOM PROCEDURE TRAY) ×2 IMPLANT
KIT ROOM TURNOVER OR (KITS) ×2 IMPLANT
NS IRRIG 1000ML POUR BTL (IV SOLUTION) ×6 IMPLANT
PACK CHEST (CUSTOM PROCEDURE TRAY) ×2 IMPLANT
PAD ARMBOARD 7.5X6 YLW CONV (MISCELLANEOUS) ×4 IMPLANT
POUCH ENDO CATCH II 15MM (MISCELLANEOUS) IMPLANT
POUCH SPECIMEN RETRIEVAL 10MM (ENDOMECHANICALS) IMPLANT
RELOAD EGIA 45 MED/THCK PURPLE (STAPLE) ×6 IMPLANT
RELOAD EGIA 60 MED/THCK PURPLE (STAPLE) ×6 IMPLANT
SEALANT PROGEL (MISCELLANEOUS) ×2 IMPLANT
SEALANT SURG COSEAL 4ML (VASCULAR PRODUCTS) IMPLANT
SEALANT SURG COSEAL 8ML (VASCULAR PRODUCTS) IMPLANT
SOLUTION ANTI FOG 6CC (MISCELLANEOUS) ×4 IMPLANT
SPONGE GAUZE 4X4 12PLY (GAUZE/BANDAGES/DRESSINGS) ×2 IMPLANT
SPONGE INTESTINAL PEANUT (DISPOSABLE) ×4 IMPLANT
SPONGE LAP 18X18 X RAY DECT (DISPOSABLE) ×2 IMPLANT
STAPLER ENDO GIA 12MM SHORT (STAPLE) ×1 IMPLANT
SUT PROLENE 4 0 RB 1 (SUTURE)
SUT PROLENE 4-0 RB1 .5 CRCL 36 (SUTURE) IMPLANT
SUT SILK  1 MH (SUTURE) ×4
SUT SILK 1 MH (SUTURE) ×4 IMPLANT
SUT SILK 2 0SH CR/8 30 (SUTURE) IMPLANT
SUT SILK 3 0SH CR/8 30 (SUTURE) IMPLANT
SUT VIC AB 1 CTX 36 (SUTURE) ×1
SUT VIC AB 1 CTX36XBRD ANBCTR (SUTURE) ×1 IMPLANT
SUT VIC AB 2-0 CTX 27 (SUTURE) ×2 IMPLANT
SUT VIC AB 2-0 CTX 36 (SUTURE) ×2 IMPLANT
SUT VIC AB 3-0 MH 27 (SUTURE) IMPLANT
SUT VIC AB 3-0 SH 27 (SUTURE) ×4
SUT VIC AB 3-0 SH 27X BRD (SUTURE) ×4 IMPLANT
SUT VIC AB 3-0 X1 27 (SUTURE) ×2 IMPLANT
SUT VICRYL 2 TP 1 (SUTURE) IMPLANT
SYSTEM SAHARA CHEST DRAIN RE-I (WOUND CARE) ×2 IMPLANT
TAPE CLOTH 4X10 WHT NS (GAUZE/BANDAGES/DRESSINGS) ×2 IMPLANT
TAPE CLOTH SURG 4X10 WHT LF (GAUZE/BANDAGES/DRESSINGS) ×2 IMPLANT
TIP APPLICATOR SPRAY EXTEND 16 (VASCULAR PRODUCTS) ×2 IMPLANT
TOWEL OR 17X24 6PK STRL BLUE (TOWEL DISPOSABLE) ×4 IMPLANT
TOWEL OR 17X26 10 PK STRL BLUE (TOWEL DISPOSABLE) ×4 IMPLANT
TRAP SPECIMEN MUCOUS 40CC (MISCELLANEOUS) ×4 IMPLANT
TRAY FOLEY CATH 16FRSI W/METER (SET/KITS/TRAYS/PACK) ×2 IMPLANT
TROCAR XCEL BLADELESS 5X75MML (TROCAR) IMPLANT
TUNNELER SHEATH ON-Q 11GX8 DSP (PAIN MANAGEMENT) IMPLANT
WATER STERILE IRR 1000ML POUR (IV SOLUTION) ×4 IMPLANT

## 2013-05-09 NOTE — Progress Notes (Signed)
Dr.Frederick updated on pt status, verified pt to transfer to 2300 per Dr.Hendrickson, ok to transfer to floor

## 2013-05-09 NOTE — H&P (View-Only) (Signed)
Reason for Consult:Right parapneumonic effusion Referring Physician: Dr. Philbert Summers is an 57 y.o. female.  HPI: 57 yo WF presented with pleuritic right sided CP.  Kelli Summers is a 57 yo woman who recently returned from her 2nd trip to [place] in 2 months. She began feeling chilled a couple of days before coming home. She returned on October 29, She was admitted to [facility] on 11/2 with general malaise, dehydration and severe hyperglycemia. She improved and was discharged after a couple of days. She then began feeliing worse and presente with fevers, chills and right sided pleuritic CP. She was readmitted. A CT of her chest now shows a large loculated right para-pneumonic effusion/ empyema.  Past Medical History  Diagnosis Date  . Diabetes mellitus   . History of stress test 01/2010    normal myoview stress test  . Chronic back pain   . Depression   . Chronic knee pain   . Hypertension     Past Surgical History  Procedure Laterality Date  . Back surgery    . Cholecystectomy    . Abdominal hysterectomy      History reviewed. No pertinent family history.  Social History:  reports that she has been smoking Cigarettes.  She has been smoking about 0.30 packs per day. She uses smokeless tobacco. She reports that she drinks alcohol. She reports that she uses illicit drugs (Marijuana) about twice per week.  Allergies:  Allergies  Allergen Reactions  . Ciprofloxacin Itching  . Hydrocodone-Acetaminophen Itching  . Penicillins Itching  . Percocet [Oxycodone-Acetaminophen] Itching    Medications: I have reviewed the patient's current medications. 0.9 % NaCl with KCl 20 mEq/ L infusion acetaminophen (TYLENOL) tablet 650 mg ceFEPIme (MAXIPIME) 1 g in dextrose 5 % 50 mL IVPB diazepam (VALIUM) tablet 5 mg DULoxetine (CYMBALTA) DR capsule 60 mg insulin aspart (novoLOG) injection 0-15 Units insulin aspart (novoLOG) injection 0-5 Units insulin detemir (LEVEMIR) injection 25 Units  levalbuterol (XOPENEX) nebulizer solution 0.63 mg metroNIDAZOLE (FLAGYL) IVPB 500 mg morphine 2 MG/ML injection 2 mg ondansetron (ZOFRAN) injection 4 mg ondansetron (ZOFRAN) tablet 4 mg oxyCODONE-acetaminophen (PERCOCET/ROXICET) 5-325 MG per tablet 1 tablet sodium chloride 0.9 % injection 3 mL vancomycin (VANCOCIN) IVPB 1000 mg/200 mL premix   Results for orders placed during the hospital encounter of 05/06/13 (from the past 48 hour(s))  LACTIC ACID, PLASMA     Status: None   Collection Time    05/06/13  8:37 PM      Result Value Range   Lactic Acid, Venous 1.8  0.5 - 2.2 mmol/L  LIPASE, BLOOD     Status: Abnormal   Collection Time    05/06/13  8:37 PM      Result Value Range   Lipase 10 (*) 11 - 59 U/L  GLUCOSE, CAPILLARY     Status: Abnormal   Collection Time    05/06/13 10:58 PM      Result Value Range   Glucose-Capillary 237 (*) 70 - 99 mg/dL   Comment 1 Notify RN    CULTURE, BLOOD (ROUTINE X 2)     Status: None   Collection Time    05/06/13 11:38 PM      Result Value Range   Specimen Description Blood BLOOD RIGHT HAND     Special Requests BOTTLES DRAWN AEROBIC AND ANAEROBIC 5CC     Culture       Value: GRAM POSITIVE COCCI IN CLUSTERS     Gram Stain Report Called to,Read  Back By and Verified With: J. BRODY AT [facility] AT 1707 ON 05/08/13 BY Wynonia Lawman   Report Status PENDING    CULTURE, BLOOD (ROUTINE X 2)     Status: None   Collection Time    05/06/13 11:47 PM      Result Value Range   Specimen Description Blood RIGHT ANTECUBITAL     Special Requests BOTTLES DRAWN AEROBIC AND ANAEROBIC 8CC     Culture NO GROWTH 1 DAY     Report Status PENDING    LEGIONELLA ANTIGEN, URINE     Status: None   Collection Time    05/07/13  2:07 AM      Result Value Range   Specimen Description URINE, CLEAN CATCH     Special Requests NONE     Legionella Antigen, Urine       Value: Negative for Legionella pneumophilia serogroup 1     Performed at [facility]   Report Status  05/08/2013 FINAL    STREP PNEUMONIAE URINARY ANTIGEN     Status: None   Collection Time    05/07/13  2:07 AM      Result Value Range   Strep Pneumo Urinary Antigen NEGATIVE  NEGATIVE   Comment:            Infection due to S. pneumoniae     cannot be absolutely ruled out     since the antigen present     may be below the detection limit     of the test.     Performed at [facility]  URINALYSIS, ROUTINE W REFLEX MICROSCOPIC     Status: Abnormal   Collection Time    05/07/13  2:07 AM      Result Value Range   Color, Urine YELLOW  YELLOW   APPearance HAZY (*) CLEAR   Specific Gravity, Urine 1.025  1.005 - 1.030   pH 6.0  5.0 - 8.0   Glucose, UA 250 (*) NEGATIVE mg/dL   Hgb urine dipstick MODERATE (*) NEGATIVE   Bilirubin Urine NEGATIVE  NEGATIVE   Ketones, ur 15 (*) NEGATIVE mg/dL   Protein, ur 161 (*) NEGATIVE mg/dL   Urobilinogen, UA 1.0  0.0 - 1.0 mg/dL   Nitrite POSITIVE (*) NEGATIVE   Leukocytes, UA SMALL (*) NEGATIVE  URINE MICROSCOPIC-ADD ON     Status: Abnormal   Collection Time    05/07/13  2:07 AM      Result Value Range   Squamous Epithelial / LPF MANY (*) RARE   WBC, UA 7-10  <3 WBC/hpf   RBC / HPF 7-10  <3 RBC/hpf   Bacteria, UA MANY (*) RARE   Casts GRANULAR CAST (*) NEGATIVE  URINE CULTURE     Status: None   Collection Time    05/07/13  2:07 AM      Result Value Range   Specimen Description URINE, CLEAN CATCH     Special Requests NONE     Culture  Setup Time       Value: 05/07/2013 03:40     Performed at [facility]   Culture       Value: Culture reincubated for better growth     Performed at [facility]   Report Status PENDING    COMPREHENSIVE METABOLIC PANEL     Status: Abnormal   Collection Time    05/07/13  5:07 AM      Result Value Range   Sodium 133 (*) 135 -  145 mEq/L   Potassium 3.5  3.5 - 5.1 mEq/L   Comment: DELTA CHECK NOTED   Chloride 102  96 - 112 mEq/L   CO2 20  19 - 32 mEq/L   Glucose, Bld 293 (*) 70 - 99  mg/dL   BUN 16  6 - 23 mg/dL   Creatinine, Ser 8.11  0.50 - 1.10 mg/dL   Calcium 8.4  8.4 - 91.4 mg/dL   Total Protein 5.5 (*) 6.0 - 8.3 g/dL   Albumin 1.5 (*) 3.5 - 5.2 g/dL   AST 18  0 - 37 U/L   ALT 16  0 - 35 U/L   Alkaline Phosphatase 215 (*) 39 - 117 U/L   Total Bilirubin 0.6  0.3 - 1.2 mg/dL   GFR calc non Af Amer 58 (*) >90 mL/min   GFR calc Af Amer 67 (*) >90 mL/min   Comment: (NOTE)     The eGFR has been calculated using the CKD EPI equation.     This calculation has not been validated in all clinical situations.     eGFR's persistently <90 mL/min signify possible Chronic Kidney     Disease.  CBC     Status: Abnormal   Collection Time    05/07/13  5:07 AM      Result Value Range   WBC 19.5 (*) 4.0 - 10.5 K/uL   RBC 3.31 (*) 3.87 - 5.11 MIL/uL   Hemoglobin 9.8 (*) 12.0 - 15.0 g/dL   HCT 78.2 (*) 95.6 - 21.3 %   MCV 87.6  78.0 - 100.0 fL   MCH 29.6  26.0 - 34.0 pg   MCHC 33.8  30.0 - 36.0 g/dL   RDW 08.6  57.8 - 46.9 %   Platelets 196  150 - 400 K/uL  PROTIME-INR     Status: None   Collection Time    05/07/13  5:07 AM      Result Value Range   Prothrombin Time 14.1  11.6 - 15.2 seconds   INR 1.11  0.00 - 1.49  APTT     Status: None   Collection Time    05/07/13  5:07 AM      Result Value Range   aPTT 36  24 - 37 seconds  LACTATE DEHYDROGENASE     Status: None   Collection Time    05/07/13  5:07 AM      Result Value Range   LDH 209  94 - 250 U/L  GLUCOSE, CAPILLARY     Status: Abnormal   Collection Time    05/07/13  7:13 AM      Result Value Range   Glucose-Capillary 254 (*) 70 - 99 mg/dL   Comment 1 Notify RN    GLUCOSE, CAPILLARY     Status: Abnormal   Collection Time    05/07/13 11:53 AM      Result Value Range   Glucose-Capillary 212 (*) 70 - 99 mg/dL   Comment 1 Notify RN    GLUCOSE, CAPILLARY     Status: Abnormal   Collection Time    05/07/13  4:20 PM      Result Value Range   Glucose-Capillary 204 (*) 70 - 99 mg/dL   Comment 1 Notify RN     GLUCOSE, CAPILLARY     Status: Abnormal   Collection Time    05/07/13  9:44 PM      Result Value Range   Glucose-Capillary 190 (*) 70 - 99 mg/dL  Comment 1 Notify RN    GLUCOSE, CAPILLARY     Status: Abnormal   Collection Time    05/08/13  7:08 AM      Result Value Range   Glucose-Capillary 191 (*) 70 - 99 mg/dL   Comment 1 Documented in Chart     Comment 2 Notify RN    CBC     Status: Abnormal   Collection Time    05/08/13  8:17 AM      Result Value Range   WBC 18.2 (*) 4.0 - 10.5 K/uL   RBC 3.16 (*) 3.87 - 5.11 MIL/uL   Hemoglobin 9.4 (*) 12.0 - 15.0 g/dL   HCT 16.1 (*) 09.6 - 04.5 %   MCV 88.3  78.0 - 100.0 fL   MCH 29.7  26.0 - 34.0 pg   MCHC 33.7  30.0 - 36.0 g/dL   RDW 40.9  81.1 - 91.4 %   Platelets 212  150 - 400 K/uL  BASIC METABOLIC PANEL     Status: Abnormal   Collection Time    05/08/13  8:17 AM      Result Value Range   Sodium 132 (*) 135 - 145 mEq/L   Potassium 3.2 (*) 3.5 - 5.1 mEq/L   Chloride 101  96 - 112 mEq/L   CO2 21  19 - 32 mEq/L   Glucose, Bld 240 (*) 70 - 99 mg/dL   BUN 20  6 - 23 mg/dL   Creatinine, Ser 7.82  0.50 - 1.10 mg/dL   Calcium 8.6  8.4 - 95.6 mg/dL   GFR calc non Af Amer 61 (*) >90 mL/min   GFR calc Af Amer 70 (*) >90 mL/min   Comment: (NOTE)     The eGFR has been calculated using the CKD EPI equation.     This calculation has not been validated in all clinical situations.     eGFR's persistently <90 mL/min signify possible Chronic Kidney     Disease.  GLUCOSE, CAPILLARY     Status: Abnormal   Collection Time    05/08/13  9:20 AM      Result Value Range   Glucose-Capillary 209 (*) 70 - 99 mg/dL  GLUCOSE, CAPILLARY     Status: Abnormal   Collection Time    05/08/13 12:01 PM      Result Value Range   Glucose-Capillary 184 (*) 70 - 99 mg/dL  STREP PNEUMONIAE URINARY ANTIGEN     Status: None   Collection Time    05/08/13  1:16 PM      Result Value Range   Strep Pneumo Urinary Antigen NEGATIVE  NEGATIVE   Comment:             Infection due to S. pneumoniae     cannot be absolutely ruled out     since the antigen present     may be below the detection limit     of the test.  GLUCOSE, CAPILLARY     Status: Abnormal   Collection Time    05/08/13  4:38 PM      Result Value Range   Glucose-Capillary 249 (*) 70 - 99 mg/dL    Dg Lumbar Spine Complete  05/06/2013   CLINICAL DATA:  Chronic low back pain  EXAM: LUMBAR SPINE - COMPLETE 4+ VIEW  COMPARISON:  Prior CT from 04/29/2013  FINDINGS: Overall alignment is stable as compared to the prior examination with trace retrolisthesis of L3 on L4. Sequelae of prior vertebral  augmentation are again seen at L3 and L4, unchanged. Vertebral body height loss T11, T12, L3, and L4 levels is not significantly changed. Multilevel degenerative disc disease and facet arthrosis is grossly similar, most severe at L2-3. No acute fracture listhesis. Diffuse osteopenia is present.  Paravertebral soft tissues are within normal limits. Prominent atherosclerotic calcifications noted within the infrarenal aorta.  IMPRESSION: 1. No acute osseous abnormality identified within the lumbar spine. 2. Stable appearance of the lumbar spine with prior vertebral augmentation at L3 and L4 with multilevel compression deformities and degenerative disc disease. .   Electronically Signed   By: Rise Mu M.D.   On: 05/06/2013 22:44   Ct Chest W Contrast  05/07/2013   CLINICAL DATA:  Patient with a loculated effusion. She was to have a thoracentesis today he, but the initial ultrasound scan suggests the that the fluid may at least in part be within consolidated lung. The right effusion was clearly and not free flowing. The patient's prior CT demonstrated essentially clear lungs, performed 8 days previously.  EXAM: CT CHEST WITH CONTRAST  TECHNIQUE: Multidetector CT imaging of the chest was performed during intravenous contrast administration.  CONTRAST:  80mL OMNIPAQUE IOHEXOL 300 MG/ML  SOLN  COMPARISON:   Current chest radiograph, 05/06/2013. Prior chest CT, 04/29/2013.  FINDINGS: There is loculated pleural fluid above the right hemidiaphragm along the right posterior lateral hemithorax extending to the level of the carina with minimal loculated pleural fluid noted in the right lateral upper hemithorax. The small mild loculated pleural fluid extends along the azygoesophageal recess. There is intervening collapsed/consolidated lung. Within the right posterior lateral lower hemi thorax pleural fluid there are bubbles of air.  On the left there is a small posterior pleural effusion with mild associated subsegmental dependent atelectasis.  There are few minor areas of reticular and focal ground-glass opacity in the upper lobes. There is no pulmonary edema.  There are prominent mediastinal lymph nodes. A right peritracheal node measures 11 mm in short axis. Above this there is an 8 mm node. There is soft tissue above the right pulmonary artery which appears to be a prominent azygos arch. The heart is normal in size. There is minimal pericardial fluid. The great vessels are normal in caliber.  A 15 mm low-density nodule projects along the posterior margin of the left thyroid lobe.  Limited evaluation of the upper abdomen shows fatty infiltration of the liver, borderline hepatomegaly and changes from a cholecystectomy.  There are degenerative changes of the visualized spine. No osteoblastic or osteolytic lesions.  IMPRESSION: 1. Loculated right pleural effusion with associated atelectatic/ consolidated lung. This has a complex appearance. A few small bubbles of air within the presumed pleural fluid these is suspicion for a bronchopleural fistula. There is a small left pleural effusion with minor dependent subsegmental atelectasis. 2. Thoracentesis to obtain diagnostic fluid can be performed. Risks from this procedure are greater than for a typical free-flowing pleural effusion. Recommend consideration of placement of a  pigtail drainage catheter within the largest loculation of pleural fluid to allow diagnostic acquisition of fluid and persistent drainage.   Electronically Signed   By: Amie Portland M.D.   On: 05/07/2013 15:58   [place] Chest  05/07/2013   CLINICAL DATA:  Patient presents for ultrasound-guided thoracentesis of the right chest for a new area of loculated pleural fluid on the right.  EXAM: CHEST ULTRASOUND  COMPARISON:  Chest radiograph, 05/06/2013  FINDINGS: There is a loculated area pleural fluid at the right  lung base that is mostly surrounded by lung that has internal echoes consistent with consolidated lung. It is unclear whether this fluid resides within the lung, within the visceral pleura, or whether this is a loculated pleural effusion, or combination of both. For this reason, thoracentesis was not performed. The patient 1 ago a contrast enhanced chest CT for further delineation of this collection.  A small left pleural effusion was incidentally noted.  IMPRESSION: Loculated right pleural fluid versus an area of necrosis in consolidated right lung or combination of both.   Electronically Signed   By: Amie Portland M.D.   On: 05/07/2013 13:37   Dg Abd Acute W/chest  05/06/2013   CLINICAL DATA:  Chronic abdominal pain.  EXAM: ACUTE ABDOMEN SERIES (ABDOMEN 2 VIEW & CHEST 1 VIEW)  COMPARISON:  04/29/2013.  FINDINGS: The cardiac silhouette, mediastinal and hilar contours are within normal limits and stable There is a new probable loculated pleural fluid collection on the right and a right lung infiltrate. This is unlikely a mass given the recent normal Chest x-ray and chest CT. The left lung is clear. The bony thorax is intact.  The abdominal radiographs demonstrate an unremarkable bowel gas pattern. There is scattered contrast throughout the colon. The soft tissue shadows are maintained. The bony structures are intact. Stable vertebral augmentation changes at L3 and L4 new large probable loculated right  pleural effusion and right lung infiltrate.  IMPRESSION: New probable loculated right pleural fluid collection and right lung infiltrate.  No acute abdominal findings. Contrast noted throughout the colon.   Electronically Signed   By: Loralie Champagne M.D.   On: 05/06/2013 19:46    Review of Systems  Constitutional: Positive for fever, chills and malaise/fatigue.  Respiratory: Positive for cough, sputum production and shortness of breath. Negative for hemoptysis.        Right sided pleuritic CP  Musculoskeletal: Positive for joint pain and myalgias.  Neurological: Positive for weakness.  All other systems reviewed and are negative.   Blood pressure 134/67, pulse 128, temperature 99.8 F (37.7 C), temperature source Oral, resp. rate 16, height 5\' 5"  (1.651 m), weight 202 lb 2.6 oz (91.7 kg), SpO2 100.00%. Physical Exam  Vitals reviewed. Constitutional: No distress.  Somnolent, obese  HENT:  Head: Normocephalic and atraumatic.  Eyes: Pupils are equal, round, and reactive to light.  Neck: No JVD present. No thyromegaly present.  Cardiovascular: Normal rate, regular rhythm and normal heart sounds.  Exam reveals no gallop and no friction rub.   No murmur heard. Respiratory:  Absent BS right base  GI: Soft. There is no tenderness.  Musculoskeletal: She exhibits no edema.  Lymphadenopathy:    She has no cervical adenopathy.  Neurological: No cranial nerve deficit.  No focal motor deficit    Assessment/Plan: 57 yo WF with a pneumonia and a large right sided para-pneumonic effusion/ empyema. She needs surgical drainage to clear the pleural space and reexpand the lung.  I discussed with the patient and her family the general nature of the procedure, the need for general anesthesia, and the incisions to be used. We discussed the expected hospital stay, overall recovery and short and long term outcomes. They understand the risks include but are not limited to death, stroke, MI, DVT/PE,  bleeding, possible need for transfusion, infections, prolonged air leaks, and other organ system dysfunction including respiratory, renal, or GI complications. She accepts these risks and agrees to proceed.  For OR in AM  Anitra Doxtater C 05/08/2013, 6:42 PM

## 2013-05-09 NOTE — Brief Op Note (Addendum)
05/06/2013 - 05/09/2013  11:44 AM  PATIENT:  Kelli Summers  57 y.o. female  PRE-OPERATIVE DIAGNOSIS:  RIGHT PLEURAL EFFUSION/EMPYEMA  POST-OPERATIVE DIAGNOSIS:  RIGHT EARLY ORGANIZING EMPYEMA  PROCEDURE:  Procedure(s):  VIDEO ASSISTED THORACOSCOPY  -Drainage of Empyema -Decortication   WEDGE RESECTION RIGHT LOWER LOBE (Right)  SURGEON:  Surgeon(s) and Role:    * Loreli Slot, MD - Primary  PHYSICIAN ASSISTANT: Erin Barrett PA-C  ANESTHESIA:   general  EBL:  Total I/O In: 1250 [I.V.:1000; IV Piggyback:250] Out: 350 [Urine:350]  BLOOD ADMINISTERED:none  DRAINS: Chest tubes x 2 right chest   LOCAL MEDICATIONS USED:  NONE  SPECIMEN:  Source of Specimen:  Wege Resection RLL, Visceral Pleural Peel, Parietal Pleural Peel, Pleural Fluid  DISPOSITION OF SPECIMEN:  Pathology, Microbiology  COUNTS:  YES  PLAN OF CARE: Admit to inpatient   PATIENT DISPOSITION:  PACU - hemodynamically stable.   Delay start of Pharmacological VTE agent (>24hrs) due to surgical blood loss or risk of bleeding: yes  Early organizing empyema with extensive friable parietal and visceral pleural peels. Good reexpansion of lung after decortication.

## 2013-05-09 NOTE — Transfer of Care (Signed)
Immediate Anesthesia Transfer of Care Note  Patient: Kelli Summers  Procedure(s) Performed: Procedure(s): VIDEO ASSISTED THORACOSCOPY (VATS)/DECORTICATION (Right) WEDGE RESECTION RIGHT LOWER LOBE (Right)  Patient Location: PACU  Anesthesia Type:General  Level of Consciousness: sedated  Airway & Oxygen Therapy: Patient Spontanous Breathing and Patient connected to face mask oxygen  Post-op Assessment: Report given to PACU RN and Post -op Vital signs reviewed and stable  Post vital signs: Reviewed and stable  Complications: No apparent anesthesia complications

## 2013-05-09 NOTE — Progress Notes (Signed)
Patient ID: Kelli Summers, female   DOB: 1955-12-22, 57 y.o.   MRN: [id number] EVENING ROUNDS NOTE :     [address]       [place] [address]             [phone]                 Day of Surgery Procedure(s) (LRB): VIDEO ASSISTED THORACOSCOPY (VATS)/DECORTICATION (Right) WEDGE RESECTION RIGHT LOWER LOBE (Right)  Total Length of Stay:  LOS: 3 days  BP 112/49  Pulse 120  Temp(Src) 98.9 F (37.2 C) (Oral)  Resp 21  Ht 5\' 5"  (1.651 m)  Wt 199 lb 1.2 oz (90.3 kg)  BMI 33.13 kg/m2  SpO2 100%  .Intake/Output     11/11 0701 - 11/12 0700 11/12 0701 - 11/13 0700   I.V. (mL/kg) 1000 (11.1) 2350 (26)   IV Piggyback 950 250   Total Intake(mL/kg) 1950 (21.6) 2600 (28.8)   Urine (mL/kg/hr) 2100 (1) 800 (0.8)   Chest Tube  300 (0.3)   Total Output 2100 1100   Net -150 +1500          . 0.45 % NaCl with KCl 20 mEq / L 100 mL/hr at 05/09/13 1723  . 0.9 % NaCl with KCl 20 mEq / L 50 mL/hr at 05/08/13 1830     Lab Results  Component Value Date   WBC 20.3* 05/09/2013   HGB 8.2* 05/09/2013   HCT 24.6* 05/09/2013   PLT 222 05/09/2013   GLUCOSE 335* 05/09/2013   CHOL  Value: 130        ATP III CLASSIFICATION:  <200     mg/dL   Desirable  403-474  mg/dL   Borderline High  >=259    mg/dL   High        10/31/3873   TRIG 194* 01/27/2010   HDL 38* 01/27/2010   LDLCALC  Value: 53        Total Cholesterol/HDL:CHD Risk Coronary Heart Disease Risk Table                     Men   Women  1/2 Average Risk   3.4   3.3  Average Risk       5.0   4.4  2 X Average Risk   9.6   7.1  3 X Average Risk  23.4   11.0        Use the calculated Patient Ratio above and the CHD Risk Table to determine the patient's CHD Risk.        ATP III CLASSIFICATION (LDL):  <100     mg/dL   Optimal  643-329  mg/dL   Near or Above                    Optimal  130-159  mg/dL   Borderline  518-841  mg/dL   High  >660     mg/dL   Very High 11/29/158   ALT 13 05/08/2013   AST 14 05/08/2013   NA 130* 05/09/2013   K 4.2  05/09/2013   CL 99 05/09/2013   CREATININE 1.05 05/09/2013   BUN 22 05/09/2013   CO2 20 05/09/2013   TSH 5.185* 01/26/2010   INR 1.14 05/08/2013   HGBA1C 14.9* 04/30/2013   Very sedated, got narcan after transfer from Recovery room Now sleepy but moves all extremities to command   Delight Ovens MD  Beeper  [phone] Office [phone] 05/09/2013 6:36 PM

## 2013-05-09 NOTE — Preoperative (Signed)
Beta Blockers   Reason not to administer Beta Blockers:Not Applicable 

## 2013-05-09 NOTE — Progress Notes (Signed)
Pt noted to be in ST on monitor rate 130s.  Has been in ST 120s since 1400 hrs.  SBP 130s. IVF at 50 cc/hr.  Denies pain, afebrile.  Pt very sleepy but appropriate when wakened.  Voiding in bedpan.  Tama Gander, NP notified of above.  Orders received for Lopressor 5 mg given with HR decreased to 100-112.  Will continue to monitor.

## 2013-05-09 NOTE — Interval H&P Note (Signed)
History and Physical Interval Note:  05/09/2013 7:55 AM  Kelli Summers  has presented today for surgery, with the diagnosis of RIGHT PLEURAL EFFUSION/EMPYEMA  The various methods of treatment have been discussed with the patient and family. After consideration of risks, benefits and other options for treatment, the patient has consented to  Procedure(s): VIDEO ASSISTED THORACOSCOPY (VATS)/DECORTICATION (Right) as a surgical intervention .  The patient's history has been reviewed, patient examined, no change in status, stable for surgery.  I have reviewed the patient's chart and labs.  Questions were answered to the patient's satisfaction.     HENDRICKSON,STEVEN C

## 2013-05-09 NOTE — Anesthesia Procedure Notes (Signed)
Procedure Name: Intubation Date/Time: 05/09/2013 8:49 AM Performed by: Marena Chancy Pre-anesthesia Checklist: Emergency Drugs available, Patient identified, Patient being monitored, Timeout performed and Suction available Patient Re-evaluated:Patient Re-evaluated prior to inductionOxygen Delivery Method: Circle system utilized Preoxygenation: Pre-oxygenation with 100% oxygen Intubation Type: IV induction Ventilation: Mask ventilation without difficulty and Oral airway inserted - appropriate to patient size Laryngoscope Size: Mac and 3 Grade View: Grade I Tube type: Oral Endobronchial tube: Left, Bronchial Blocker placed under direct vision, EBT position confirmed by fiberoptic bronchoscope and EBT position confirmed by auscultation and 37 Fr Number of attempts: 1 Placement Confirmation: ETT inserted through vocal cords under direct vision,  breath sounds checked- equal and bilateral and positive ETCO2 Tube secured with: Tape Dental Injury: Teeth and Oropharynx as per pre-operative assessment

## 2013-05-09 NOTE — Progress Notes (Signed)
[facility] Progress Note [place] TEAM 1 - Stepdown ICU Team   Kelli Summers ZOX:[id number] DOB: May 25, 1956 DOA: 05/06/2013 PCP: Colette Ribas, MD  Brief narrative: 57 y.o. female with a past medical history of, diabetes, gastroparesis, who was recently hospitalized for hyperglycemia. She recently returned from [place] on October 29 and was experiencing fever and body aches with hyperglycemia. There was a concern about Chikungunya virus. However, she was noted to be afebrile in the hospital. She was discharged after her blood sugars were adequately controlled. She was doing well on her discharge day.   This past Thursday her symptoms returned consisting of body aches, joint pains and headaches. Later she developed a temperature of 101F. She's been short of breath and has had chest pain, especially when lying on the right side. However, she denied any cough. She's had about 4 episodes of nausea and vomiting-non bloody. She's had abdominal pain in the lower part of the abdomen, which is 6/10 in intensity, dull pain without any radiation.  Denied any diarrhea. Last bowel movement was 24 hours prior to presentation. She avoided coming to the hospital but due to significant weakness she decided to come in. She endorsed poor oral intake for several days prior to presentation.  Work up after admission revealed HCAP with a loculated pleural effusion. IR was consulted for consideration for US guided thoracentesis. Pulmonologist Dr. Juanetta Gosling was following and after reviewing the CT chest cancelled the thora and contacted TCTS/[facility] and the pt was then transferred to [facility] for planned VATS.   Assessment/Plan: Active Problems:   Acute respiratory failure with hypoxia due to:   A) HCAP (healthcare-associated pneumonia)   B) Loculated pleural effusion -post VATS with drainage of early organizing empyema and decortication 11/12 -cont supportive care/oxygen -cont empiric anbx's -CT's and  pain management per TCTS    Diabetes mellitus type 2, uncontrolled -on SSI and Levemir pre op-resume post op    Dehydration with hyponatremia -cont IVFs    Hypokalemia -replete prn and follow lytes   DVT prophylaxis: Discretion of surgical team Code Status: Full Family Communication: Patient currently in the operating room Disposition Plan/Expected LOS: Postop plan is to transfer patient to 2 south Isolation: None Nutritional Status: Acute protein calorie malnutrition related to acute illness  Consultants: TCTS  Procedures: VATS with decortication and drainage of empyema   11/12  Antibiotics: Cefepime 11/09 >>> Flagyl 11/09 >>> Vancomycin 11/09 >>>  HPI/Subjective: Sedated in PACU.  Objective: Blood pressure 151/74, pulse 116, temperature 99.8 F (37.7 C), temperature source Oral, resp. rate 18, height 5\' 5"  (1.651 m), weight 199 lb 1.2 oz (90.3 kg), SpO2 98.00%.  Intake/Output Summary (Last 24 hours) at 05/09/13 1306 Last data filed at 05/09/13 1225  Gross per 24 hour  Intake   3800 ml  Output   1900 ml  Net   1900 ml     Exam: General: No acute respiratory distress Lungs: Congested upper airway sounds/rhonchorous- somewhat diminished in the bases, partial re breather mask Cardiovascular: Regular tachycardic rate and sinus rhythm without murmur gallop or rub normal S1 and S2, no peripheral edema or JVD Abdomen: Nontender, nondistended, soft, bowel sounds positive, no rebound, no ascites, no appreciable mass Musculoskeletal: No significant cyanosis, clubbing of bilateral lower extremities Neurological: Sedated,sponatenously moves all extremities x 4 without obvious focal neurological deficits when stimulated  Scheduled Meds:  Scheduled Meds: . [MAR HOLD] ceFEPime (MAXIPIME) IV  1 g Intravenous Q8H  . [facility] HOLD] DULoxetine  60 mg Oral  Daily  . fentaNYL   Intravenous Q4H  . insulin aspart      . [MAR HOLD] insulin aspart  0-15 Units Subcutaneous TID WC  .  [MAR HOLD] insulin aspart  0-5 Units Subcutaneous QHS  . [MAR HOLD] insulin detemir  25 Units Subcutaneous QHS  . [facility] HOLD] levalbuterol  0.63 mg Nebulization Q6H  . [facility] HOLD] metronidazole  500 mg Intravenous Q8H  . [MAR HOLD] sodium chloride  3 mL Intravenous Q12H  . [facility] HOLD] vancomycin  1,000 mg Intravenous Q12H   Continuous Infusions: . 0.9 % NaCl with KCl 20 mEq / L 50 mL/hr at 05/08/13 1830    **Reviewed in detail by the Attending Physician  Data Reviewed: Basic Metabolic Panel:  Recent Labs Lab 05/06/13 1649 05/07/13 0507 05/08/13 0817 05/08/13 2030  NA 131* 133* 132* 132*  K 2.3* 3.5 3.2* 3.5  CL 99 102 101 99  CO2 20 20 21 20   GLUCOSE 322* 293* 240* 209*  BUN 16 16 20 20   CREATININE 0.91 1.05 1.01 1.00  CALCIUM 8.7 8.4 8.6 8.3*  MG 1.4*  --   --   --    Liver Function Tests:  Recent Labs Lab 05/06/13 1649 05/07/13 0507 05/08/13 2030  AST 15 18 14   ALT 15 16 13   ALKPHOS 211* 215* 207*  BILITOT 0.6 0.6 0.5  PROT 5.6* 5.5* 5.7*  ALBUMIN 1.5* 1.5* 1.4*    Recent Labs Lab 05/06/13 2037  LIPASE 10*   No results found for this basename: AMMONIA,  in the last 168 hours CBC:  Recent Labs Lab 05/06/13 1649 05/07/13 0507 05/08/13 0817 05/08/13 1930  WBC 13.5* 19.5* 18.2* 15.9*  NEUTROABS 12.7*  --   --   --   HGB 10.8* 9.8* 9.4* 9.1*  HCT 31.9* 29.0* 27.9* 25.8*  MCV 87.2 87.6 88.3 87.2  PLT 192 196 212 217   Cardiac Enzymes: No results found for this basename: CKTOTAL, CKMB, CKMBINDEX, TROPONINI,  in the last 168 hours BNP (last 3 results) No results found for this basename: PROBNP,  in the last 8760 hours CBG:  Recent Labs Lab 05/08/13 1201 05/08/13 1638 05/08/13 2142 05/09/13 0631 05/09/13 1239  GLUCAP 184* 249* 196* 239* 335*    Recent Results (from the past 240 hour(s))  MRSA PCR SCREENING     Status: None   Collection Time    04/30/13 12:20 AM      Result Value Range Status   MRSA by PCR NEGATIVE  NEGATIVE Final    Comment:            The GeneXpert MRSA Assay (FDA     approved for NASAL specimens     only), is one component of a     comprehensive MRSA colonization     surveillance program. It is not     intended to diagnose MRSA     infection nor to guide or     monitor treatment for     MRSA infections.  CULTURE, BLOOD (ROUTINE X 2)     Status: None   Collection Time    05/06/13 11:38 PM      Result Value Range Status   Specimen Description Blood BLOOD RIGHT HAND   Final   Special Requests BOTTLES DRAWN AEROBIC AND ANAEROBIC 5CC   Final   Culture     Final   Value: GRAM POSITIVE COCCI IN CLUSTERS     Gram Stain Report Called to,Read Back By and Verified  With: J. BRODY AT [facility] AT 1707 ON 05/08/13 BY Wynonia Lawman   Report Status PENDING   Incomplete  CULTURE, BLOOD (ROUTINE X 2)     Status: None   Collection Time    05/06/13 11:47 PM      Result Value Range Status   Specimen Description Blood RIGHT ANTECUBITAL   Final   Special Requests BOTTLES DRAWN AEROBIC AND ANAEROBIC 8CC   Final   Culture NO GROWTH 2 DAYS   Final   Report Status PENDING   Incomplete  URINE CULTURE     Status: None   Collection Time    05/07/13  2:07 AM      Result Value Range Status   Specimen Description URINE, CLEAN CATCH   Final   Special Requests NONE   Final   Culture  Setup Time     Final   Value: 05/07/2013 03:40     Performed at [facility]   Culture     Final   Value: Multiple bacterial morphotypes present, none predominant. Suggest appropriate recollection if clinically indicated.     Performed at [facility]   Report Status 05/08/2013 FINAL   Final     Studies:  Recent x-ray studies have been reviewed in detail by the Attending Physician     Junious Silk, ANP [facility] Office  [phone] Pager [phone]  **If unable to reach the above provider after paging please contact the Flow Manager @ [phone]  On-Call/Text Page:      [email or web address]      password [email or web address]  If  7PM-7AM, please contact night-coverage [email or web address] Password [email or web address] 05/09/2013, 1:06 PM   LOS: 3 days   I have personally examined the patient and reviewed the entire database. Agree with the above note and plan as outlined, any necessary changes made.  RAI,RIPUDEEP M.D. [facility] 05/09/2013, 4:29 PM Pager: [phone]

## 2013-05-09 NOTE — Anesthesia Postprocedure Evaluation (Signed)
Anesthesia Post Note  Patient: Kelli Summers  Procedure(s) Performed: Procedure(s) (LRB): VIDEO ASSISTED THORACOSCOPY (VATS)/DECORTICATION (Right) WEDGE RESECTION RIGHT LOWER LOBE (Right)  Anesthesia type: general  Patient location: PACU  Post pain: Pain level controlled  Post assessment: Patient's Cardiovascular Status Stable  Last Vitals:  Filed Vitals:   05/09/13 1430  BP: 116/71  Pulse: 113  Temp:   Resp: 22    Post vital signs: Reviewed and stable  Level of consciousness: sedated  Complications: No apparent anesthesia complications

## 2013-05-09 NOTE — Anesthesia Preprocedure Evaluation (Signed)
Anesthesia Evaluation  Patient identified by MRN, date of birth, ID band Patient awake    Reviewed: Allergy & Precautions, H&P , NPO status , Patient's Chart, lab work & pertinent test results, reviewed documented beta blocker date and time   Airway Mallampati: II TM Distance: >3 FB Neck ROM: full    Dental   Pulmonary pneumonia -, unresolved, Current Smoker,  breath sounds clear to auscultation        Cardiovascular hypertension, On Medications Rhythm:regular     Neuro/Psych PSYCHIATRIC DISORDERS Depression negative neurological ROS     GI/Hepatic negative GI ROS, Neg liver ROS,   Endo/Other  diabetes, Poorly Controlled  Renal/GU negative Renal ROS  negative genitourinary   Musculoskeletal   Abdominal   Peds  Hematology negative hematology ROS (+)   Anesthesia Other Findings See surgeon's H&P   Reproductive/Obstetrics negative OB ROS                           Anesthesia Physical Anesthesia Plan  ASA: III  Anesthesia Plan: General   Post-op Pain Management:    Induction: Intravenous  Airway Management Planned: Double Lumen EBT  Additional Equipment: Arterial line and CVP  Intra-op Plan:   Post-operative Plan: Extubation in OR  Informed Consent: I have reviewed the patients History and Physical, chart, labs and discussed the procedure including the risks, benefits and alternatives for the proposed anesthesia with the patient or authorized representative who has indicated his/her understanding and acceptance.   Dental Advisory Given  Plan Discussed with: CRNA and Surgeon  Anesthesia Plan Comments:         Anesthesia Quick Evaluation

## 2013-05-10 ENCOUNTER — Inpatient Hospital Stay (HOSPITAL_COMMUNITY): Payer: 59

## 2013-05-10 DIAGNOSIS — R7309 Other abnormal glucose: Secondary | ICD-10-CM

## 2013-05-10 DIAGNOSIS — G9340 Encephalopathy, unspecified: Secondary | ICD-10-CM

## 2013-05-10 DIAGNOSIS — D500 Iron deficiency anemia secondary to blood loss (chronic): Secondary | ICD-10-CM

## 2013-05-10 DIAGNOSIS — J869 Pyothorax without fistula: Secondary | ICD-10-CM

## 2013-05-10 LAB — CBC
HCT: 22.5 % — ABNORMAL LOW (ref 36.0–46.0)
Hemoglobin: 7.6 g/dL — ABNORMAL LOW (ref 12.0–15.0)
MCH: 30 pg (ref 26.0–34.0)
MCHC: 33.8 g/dL (ref 30.0–36.0)
MCV: 88.9 fL (ref 78.0–100.0)
Platelets: 290 10*3/uL (ref 150–400)
RBC: 2.53 MIL/uL — ABNORMAL LOW (ref 3.87–5.11)
RDW: 14.3 % (ref 11.5–15.5)
WBC: 20.2 10*3/uL — ABNORMAL HIGH (ref 4.0–10.5)

## 2013-05-10 LAB — POCT I-STAT 3, ART BLOOD GAS (G3+)
Acid-base deficit: 2 mmol/L (ref 0.0–2.0)
Bicarbonate: 22.4 mEq/L (ref 20.0–24.0)
O2 Saturation: 96 %
Patient temperature: 98
TCO2: 24 mmol/L (ref 0–100)
pCO2 arterial: 37.5 mmHg (ref 35.0–45.0)
pH, Arterial: 7.383 (ref 7.350–7.450)
pO2, Arterial: 85 mmHg (ref 80.0–100.0)

## 2013-05-10 LAB — BASIC METABOLIC PANEL
BUN: 25 mg/dL — ABNORMAL HIGH (ref 6–23)
CO2: 20 mEq/L (ref 19–32)
Calcium: 7.9 mg/dL — ABNORMAL LOW (ref 8.4–10.5)
Chloride: 100 mEq/L (ref 96–112)
Creatinine, Ser: 1.18 mg/dL — ABNORMAL HIGH (ref 0.50–1.10)
GFR calc Af Amer: 58 mL/min — ABNORMAL LOW (ref >90)
GFR calc non Af Amer: 50 mL/min — ABNORMAL LOW (ref >90)
Glucose, Bld: 292 mg/dL — ABNORMAL HIGH (ref 70–99)
Potassium: 4.1 mEq/L (ref 3.5–5.1)
Sodium: 131 mEq/L — ABNORMAL LOW (ref 135–145)

## 2013-05-10 LAB — TSH: TSH: 2.535 u[IU]/mL (ref 0.350–4.500)

## 2013-05-10 LAB — GLUCOSE, CAPILLARY
Glucose-Capillary: 295 mg/dL — ABNORMAL HIGH (ref 70–99)
Glucose-Capillary: 255 mg/dL — ABNORMAL HIGH (ref 70–99)
Glucose-Capillary: 226 mg/dL — ABNORMAL HIGH (ref 70–99)
Glucose-Capillary: 201 mg/dL — ABNORMAL HIGH (ref 70–99)
Glucose-Capillary: 127 mg/dL — ABNORMAL HIGH (ref 70–99)

## 2013-05-10 LAB — VANCOMYCIN, TROUGH: Vancomycin Tr: 30.6 ug/mL (ref 10.0–20.0)

## 2013-05-10 LAB — T3: T3, Total: 32.1 ng/dl — ABNORMAL LOW (ref 80.0–204.0)

## 2013-05-10 LAB — T4, FREE: Free T4: 1.07 ng/dL (ref 0.80–1.80)

## 2013-05-10 LAB — LACTIC ACID, PLASMA: Lactic Acid, Venous: 0.9 mmol/L (ref 0.5–2.2)

## 2013-05-10 LAB — AMMONIA: Ammonia: 24 umol/L (ref 11–60)

## 2013-05-10 MED ORDER — INSULIN DETEMIR 100 UNIT/ML ~~LOC~~ SOLN
25.0000 [IU] | Freq: Two times a day (BID) | SUBCUTANEOUS | Status: DC
  Administered 2013-05-10 – 2013-05-11 (×3): 25 [IU] via SUBCUTANEOUS
  Filled 2013-05-10 (×4): qty 0.25

## 2013-05-10 MED ORDER — DEXTROSE 5 % IV SOLN
1.0000 g | Freq: Three times a day (TID) | INTRAVENOUS | Status: DC
  Administered 2013-05-10 – 2013-05-11 (×3): 1 g via INTRAVENOUS
  Filled 2013-05-10 (×6): qty 1

## 2013-05-10 MED ORDER — SODIUM CHLORIDE 0.9 % IV BOLUS (SEPSIS)
500.0000 mL | Freq: Once | INTRAVENOUS | Status: AC
  Administered 2013-05-10: 500 mL via INTRAVENOUS

## 2013-05-10 MED ORDER — INSULIN ASPART 100 UNIT/ML ~~LOC~~ SOLN
4.0000 [IU] | Freq: Three times a day (TID) | SUBCUTANEOUS | Status: DC
  Administered 2013-05-10: 4 [IU] via SUBCUTANEOUS

## 2013-05-10 MED ORDER — ENOXAPARIN SODIUM 40 MG/0.4ML ~~LOC~~ SOLN
40.0000 mg | SUBCUTANEOUS | Status: DC
  Administered 2013-05-10 – 2013-05-17 (×8): 40 mg via SUBCUTANEOUS
  Filled 2013-05-10 (×10): qty 0.4

## 2013-05-10 MED ORDER — GUAIFENESIN ER 600 MG PO TB12
1200.0000 mg | ORAL_TABLET | Freq: Two times a day (BID) | ORAL | Status: DC
  Administered 2013-05-11: 1200 mg via ORAL
  Filled 2013-05-10 (×3): qty 2

## 2013-05-10 NOTE — Progress Notes (Signed)
ANTIBIOTIC CONSULT NOTE - Follow-Up  Pharmacy Consult for Vancomycin, change cefepime to aztreonam Indication: pneumonia  Allergies  Allergen Reactions  . Ciprofloxacin Itching  . Hydrocodone-Acetaminophen Itching  . Penicillins Itching  . Percocet [Oxycodone-Acetaminophen] Itching   Patient Measurements: Height: 5\' 5"  (165.1 cm) Weight: 199 lb 1.2 oz (90.3 kg) IBW/kg (Calculated) : 57  Vital Signs: Temp: 98.1 F (36.7 C) (11/13 1517) Temp src: Oral (11/13 1517) BP: 96/55 mmHg (11/13 1500) Pulse Rate: 118 (11/13 1500) Intake/Output from previous day: 11/12 0701 - 11/13 0700 In: 4400 [I.V.:3550; IV Piggyback:850] Out: 2405 [Urine:1845; Chest Tube:560] Intake/Output from this shift: Total I/O In: 795.7 [I.V.:445.7; IV Piggyback:350] Out: 610 [Urine:490; Chest Tube:120]  Labs:  Recent Labs  05/08/13 1930 05/08/13 2030 05/09/13 1305 05/10/13 0355  WBC 15.9*  --  20.3* 20.2*  HGB 9.1*  --  8.2* 7.6*  PLT 217  --  222 290  CREATININE  --  1.00 1.05 1.18*   Estimated Creatinine Clearance: 58.4 ml/min (by C-G formula based on Cr of 1.18). No results found for this basename: VANCOTROUGH, Leodis Binet, VANCORANDOM, GENTTROUGH, GENTPEAK, GENTRANDOM, TOBRATROUGH, TOBRAPEAK, TOBRARND, AMIKACINPEAK, AMIKACINTROU, AMIKACIN,  in the last 72 hours   Microbiology: Recent Results (from the past 720 hour(s))  MRSA PCR SCREENING     Status: None   Collection Time    04/30/13 12:20 AM      Result Value Range Status   MRSA by PCR NEGATIVE  NEGATIVE Final   Comment:            The GeneXpert MRSA Assay (FDA     approved for NASAL specimens     only), is one component of a     comprehensive MRSA colonization     surveillance program. It is not     intended to diagnose MRSA     infection nor to guide or     monitor treatment for     MRSA infections.  CULTURE, BLOOD (ROUTINE X 2)     Status: None   Collection Time    05/06/13 11:38 PM      Result Value Range Status   Specimen  Description BLOOD RIGHT HAND   Final   Special Requests BOTTLES DRAWN AEROBIC AND ANAEROBIC 5CC   Final   Culture  Setup Time     Final   Value: 05/09/2013 00:11     Performed at [facility]   Culture     Final   Value: GRAM POSITIVE COCCI IN CLUSTERS     Note: Gram Stain Report Called to,Read Back By and Verified With: J BRODY AT [facility] 1707 05/08/13 BY S VANHOORNE Performed at [facility]     Performed at [facility]   Report Status PENDING   Incomplete  CULTURE, BLOOD (ROUTINE X 2)     Status: None   Collection Time    05/06/13 11:47 PM      Result Value Range Status   Specimen Description BLOOD RIGHT ANTECUBITAL   Final   Special Requests BOTTLES DRAWN AEROBIC AND ANAEROBIC 8CC   Final   Culture NO GROWTH 3 DAYS   Final   Report Status PENDING   Incomplete  URINE CULTURE     Status: None   Collection Time    05/07/13  2:07 AM      Result Value Range Status   Specimen Description URINE, CLEAN CATCH   Final   Special Requests NONE   Final   Culture  Setup  Time     Final   Value: 05/07/2013 03:40     Performed at [facility]   Culture     Final   Value: Multiple bacterial morphotypes present, none predominant. Suggest appropriate recollection if clinically indicated.     Performed at [facility]   Report Status 05/08/2013 FINAL   Final  CULTURE, BLOOD (ROUTINE X 2)     Status: None   Collection Time    05/08/13  6:46 PM      Result Value Range Status   Specimen Description BLOOD RIGHT ARM   Final   Special Requests BOTTLES DRAWN AEROBIC AND ANAEROBIC 10CC   Final   Culture  Setup Time     Final   Value: 05/09/2013 01:08     Performed at [facility]   Culture     Final   Value:        BLOOD CULTURE RECEIVED NO GROWTH TO DATE CULTURE WILL BE HELD FOR 5 DAYS BEFORE ISSUING A FINAL NEGATIVE REPORT     Performed at [facility]   Report Status PENDING   Incomplete  CULTURE, BLOOD (ROUTINE X 2)     Status: None    Collection Time    05/08/13  6:54 PM      Result Value Range Status   Specimen Description BLOOD RIGHT HAND   Final   Special Requests BOTTLES DRAWN AEROBIC ONLY 4CC   Final   Culture  Setup Time     Final   Value: 05/09/2013 01:12     Performed at [facility]   Culture     Final   Value:        BLOOD CULTURE RECEIVED NO GROWTH TO DATE CULTURE WILL BE HELD FOR 5 DAYS BEFORE ISSUING A FINAL NEGATIVE REPORT     Performed at [facility]   Report Status PENDING   Incomplete  FUNGUS CULTURE W SMEAR     Status: None   Collection Time    05/09/13  9:20 AM      Result Value Range Status   Specimen Description PLEURAL FLUID RIGHT   Final   Special Requests PT ON VANCO FLAGYL MAXIPIME   Final   Fungal Smear     Final   Value: NO YEAST OR FUNGAL ELEMENTS SEEN     Performed at [facility]   Culture     Final   Value: CULTURE IN PROGRESS FOR FOUR WEEKS     Performed at [facility]   Report Status PENDING   Incomplete  BODY FLUID CULTURE     Status: None   Collection Time    05/09/13  9:20 AM      Result Value Range Status   Specimen Description PLEURAL FLUID RIGHT   Final   Special Requests PT ON VANCO FLAGYL MAXIPIME   Final   Gram Stain     Final   Value: ABUNDANT WBC PRESENT,BOTH PMN AND MONONUCLEAR     ABUNDANT GRAM POSITIVE COCCI IN CLUSTERS     Gram Stain Report Called to,Read Back By and Verified With: Gram Stain Report Called to,Read Back By and Verified With:  PUJA PATEL @0747  ON 05/10/13 BY SMIAS     Performed at [facility]   Culture     Final   Value: NO GROWTH 1 DAY     Performed at [facility]   Report Status PENDING   Incomplete  FUNGUS  CULTURE W SMEAR     Status: None   Collection Time    05/09/13  9:26 AM      Result Value Range Status   Specimen Description PLEURAL FLUID RIGHT   Final   Special Requests PT ON VANCO FLAGYL MAXIPIME   Final   Fungal Smear     Final   Value: NO YEAST OR FUNGAL ELEMENTS SEEN      Performed at [facility]   Culture     Final   Value: CULTURE IN PROGRESS FOR FOUR WEEKS     Performed at [facility]   Report Status PENDING   Incomplete  BODY FLUID CULTURE     Status: None   Collection Time    05/09/13  9:26 AM      Result Value Range Status   Specimen Description PLEURAL FLUID RIGHT   Final   Special Requests PT ON VANCO FLAGYL MAXIPIME   Final   Gram Stain     Final   Value: RARE WBC PRESENT, PREDOMINANTLY PMN     NO ORGANISMS SEEN     Performed at [facility]   Culture     Final   Value: NO GROWTH 1 DAY     Performed at [facility]   Report Status PENDING   Incomplete  FUNGUS CULTURE W SMEAR     Status: None   Collection Time    05/09/13  9:39 AM      Result Value Range Status   Specimen Description TISSUE PLEURAL   Final   Special Requests RIGHT PLEURAL PEEL PT ON VANCO FLAGYL MAXIPIME   Final   Fungal Smear     Final   Value: NO YEAST OR FUNGAL ELEMENTS SEEN     Performed at [facility]   Culture     Final   Value: CULTURE IN PROGRESS FOR FOUR WEEKS     Performed at [facility]   Report Status PENDING   Incomplete  TISSUE CULTURE     Status: None   Collection Time    05/09/13  9:39 AM      Result Value Range Status   Specimen Description TISSUE PLEURAL   Final   Special Requests RIGHT PLEURAL PEEL PT ON VANCO FLAGYL MAXIPIME   Final   Gram Stain     Final   Value: RARE WBC PRESENT,BOTH PMN AND MONONUCLEAR     NO ORGANISMS SEEN     Performed at [facility]   Culture     Final   Value: NO GROWTH 1 DAY     Performed at [facility]   Report Status PENDING   Incomplete  ANAEROBIC CULTURE     Status: None   Collection Time    05/09/13  9:39 AM      Result Value Range Status   Specimen Description TISSUE PLEURAL   Final   Special Requests RIGHT PLEURAL PEEL PT ON VANCO FLAYGYL MAXIPIME   Final   Gram Stain PENDING   Incomplete   Culture     Final   Value: NO ANAEROBES  ISOLATED; CULTURE IN PROGRESS FOR 5 DAYS     Performed at [facility]   Report Status PENDING   Incomplete  FUNGUS CULTURE W SMEAR     Status: None   Collection Time    05/09/13  9:52 AM      Result Value Range Status   Specimen Description TISSUE  PLEURAL   Final   Special Requests PARIETAL PLEURAL PEEL PT ON VANCO FLAGYL MAXIPIME   Final   Fungal Smear     Final   Value: NO YEAST OR FUNGAL ELEMENTS SEEN     Performed at [facility]   Culture     Final   Value: CULTURE IN PROGRESS FOR FOUR WEEKS     Performed at [facility]   Report Status PENDING   Incomplete  ANAEROBIC CULTURE     Status: None   Collection Time    05/09/13  9:52 AM      Result Value Range Status   Specimen Description TISSUE PLEURAL   Final   Special Requests PARIETAL PLEURAL PEEL PT ON VANCO FLAGYL MAXIPIME   Final   Gram Stain     Final   Value: RARE WBC PRESENT,BOTH PMN AND MONONUCLEAR     NO ORGANISMS SEEN     Performed at [facility]   Culture     Final   Value: NO ANAEROBES ISOLATED; CULTURE IN PROGRESS FOR 5 DAYS     Performed at [facility]   Report Status PENDING   Incomplete  TISSUE CULTURE     Status: None   Collection Time    05/09/13  9:52 AM      Result Value Range Status   Specimen Description TISSUE PLEURAL   Final   Special Requests PARIETAL PLEURAL PEEL PT ON VANCO FLAGYL MAXIPIME   Final   Gram Stain     Final   Value: RARE WBC PRESENT,BOTH PMN AND MONONUCLEAR     NO ORGANISMS SEEN     Performed at [facility]   Culture     Final   Value: NO GROWTH 1 DAY     Performed at [facility]   Report Status PENDING   Incomplete  FUNGUS CULTURE W SMEAR     Status: None   Collection Time    05/09/13 11:06 AM      Result Value Range Status   Specimen Description TISSUE LUNG RIGHT   Final   Special Requests RIGHT LOWER LOBE PT ON VANCOMYCIN,FLAGYL,MAXIPIME   Final   Fungal Smear     Final   Value: NO YEAST OR FUNGAL ELEMENTS SEEN      Performed at [facility]   Culture     Final   Value: CULTURE IN PROGRESS FOR FOUR WEEKS     Performed at [facility]   Report Status PENDING   Incomplete  ANAEROBIC CULTURE     Status: None   Collection Time    05/09/13 11:06 AM      Result Value Range Status   Specimen Description TISSUE LUNG RIGHT   Final   Special Requests RIGHT LOWER LOBE PT ON VANCOMYCIN,FLAGYL,MAXIPIME   Final   Gram Stain     Final   Value: RARE WBC PRESENT,BOTH PMN AND MONONUCLEAR     NO ORGANISMS SEEN     Performed at [facility]   Culture     Final   Value: NO ANAEROBES ISOLATED; CULTURE IN PROGRESS FOR 5 DAYS     Performed at [facility]   Report Status PENDING   Incomplete  TISSUE CULTURE     Status: None   Collection Time    05/09/13 11:06 AM      Result Value Range Status   Specimen Description TISSUE LUNG RIGHT   Final  Special Requests RIGHT LOWER LOBE PT ON VANCOMYCIN,FLAGYL,MAXIPIME   Final   Gram Stain     Final   Value: RARE WBC PRESENT,BOTH PMN AND MONONUCLEAR     NO ORGANISMS SEEN     Performed at [facility]   Culture     Final   Value: NO GROWTH 1 DAY     Performed at [facility]   Report Status PENDING   Incomplete   Medical History: Past Medical History  Diagnosis Date  . Diabetes mellitus   . History of stress test 01/2010    normal myoview stress test  . Chronic back pain   . Depression   . Chronic knee pain   . Hypertension    Medications:  Scheduled:  . acetaminophen  1,000 mg Oral Q6H   Or  . acetaminophen (TYLENOL) oral liquid 160 mg/5 mL  1,000 mg Oral Q6H  . aztreonam  1 g Intravenous Q8H  . bisacodyl  10 mg Oral Daily  . DULoxetine  60 mg Oral Daily  . enoxaparin (LOVENOX) injection  40 mg Subcutaneous Q24H  . fentaNYL   Intravenous Q4H  . guaiFENesin  1,200 mg Oral BID  . insulin aspart  0-15 Units Subcutaneous TID WC  . insulin aspart  0-5 Units Subcutaneous QHS  . insulin detemir  25 Units  Subcutaneous BID  . levalbuterol  0.63 mg Nebulization Q6H  . metronidazole  500 mg Intravenous Q8H  . sodium chloride  500 mL Intravenous Once  . sodium chloride  3 mL Intravenous Q12H  . vancomycin  1,000 mg Intravenous Q12H   Assessment: 57yo female who was recently hospitalized with fever and body aches.  Pt was discharged and now readmitted with fever and pain.    Started on empiric antibiotics for HCAP with loculated pleural effusion, sepsis and possible bacteremia.  Cultures still growing.  Pharmacy asked to change cefepime to azactam.  Vancomycin 11/9 >> Cefepime 11/9 >>11/13 Flagyl 11/9 >> Azactam 11/13  Goal of Therapy:  Vancomycin trough level 15-20 mcg/ml  Plan:  1. Azactam 1g IV q 8 hrs. 2. F/u vancomycin trough level tonight. 3. Continue to follow cultures, renal function, and clinical course.  Tad Moore, BCPS  Clinical Pharmacist Pager [phone]  05/10/2013 3:50 PM

## 2013-05-10 NOTE — Progress Notes (Signed)
ANTIBIOTIC CONSULT NOTE - Follow-Up  Pharmacy Consult for Vancomycin, change cefepime to aztreonam Indication: pneumonia  Allergies  Allergen Reactions  . Ciprofloxacin Itching  . Hydrocodone-Acetaminophen Itching  . Penicillins Itching  . Percocet [Oxycodone-Acetaminophen] Itching   Patient Measurements: Height: 5\' 5"  (165.1 cm) Weight: 199 lb 1.2 oz (90.3 kg) IBW/kg (Calculated) : 57  Vital Signs: Temp: 98.3 F (36.8 C) (11/13 1900) Temp src: Oral (11/13 1900) BP: 99/49 mmHg (11/13 2200) Pulse Rate: 115 (11/13 2200) Intake/Output from previous day: 11/12 0701 - 11/13 0700 In: 4400 [I.V.:3550; IV Piggyback:850] Out: 2405 [Urine:1845; Chest Tube:560] Intake/Output from this shift: Total I/O In: 306 [I.V.:306] Out: 150 [Urine:150]  Labs:  Recent Labs  05/08/13 1930 05/08/13 2030 05/09/13 1305 05/10/13 0355  WBC 15.9*  --  20.3* 20.2*  HGB 9.1*  --  8.2* 7.6*  PLT 217  --  222 290  CREATININE  --  1.00 1.05 1.18*   Estimated Creatinine Clearance: 58.4 ml/min (by C-G formula based on Cr of 1.18).  Recent Labs  05/10/13 2120  VANCOTROUGH 30.6*     Microbiology: Recent Results (from the past 720 hour(s))  MRSA PCR SCREENING     Status: None   Collection Time    04/30/13 12:20 AM      Result Value Range Status   MRSA by PCR NEGATIVE  NEGATIVE Final   Comment:            The GeneXpert MRSA Assay (FDA     approved for NASAL specimens     only), is one component of a     comprehensive MRSA colonization     surveillance program. It is not     intended to diagnose MRSA     infection nor to guide or     monitor treatment for     MRSA infections.  CULTURE, BLOOD (ROUTINE X 2)     Status: None   Collection Time    05/06/13 11:38 PM      Result Value Range Status   Specimen Description BLOOD RIGHT HAND   Final   Special Requests BOTTLES DRAWN AEROBIC AND ANAEROBIC 5CC   Final   Culture  Setup Time     Final   Value: 05/09/2013 00:11     Performed at  [facility]   Culture     Final   Value: GRAM POSITIVE COCCI IN CLUSTERS     Note: Gram Stain Report Called to,Read Back By and Verified With: J BRODY AT [facility] 1707 05/08/13 BY S VANHOORNE Performed at [facility]     Performed at [facility]   Report Status PENDING   Incomplete  CULTURE, BLOOD (ROUTINE X 2)     Status: None   Collection Time    05/06/13 11:47 PM      Result Value Range Status   Specimen Description BLOOD RIGHT ANTECUBITAL   Final   Special Requests BOTTLES DRAWN AEROBIC AND ANAEROBIC 8CC   Final   Culture NO GROWTH 3 DAYS   Final   Report Status PENDING   Incomplete  URINE CULTURE     Status: None   Collection Time    05/07/13  2:07 AM      Result Value Range Status   Specimen Description URINE, CLEAN CATCH   Final   Special Requests NONE   Final   Culture  Setup Time     Final   Value: 05/07/2013 03:40     Performed at  [facility]     Final   Value: Multiple bacterial morphotypes present, none predominant. Suggest appropriate recollection if clinically indicated.     Performed at [facility]   Report Status 05/08/2013 FINAL   Final  CULTURE, BLOOD (ROUTINE X 2)     Status: None   Collection Time    05/08/13  6:46 PM      Result Value Range Status   Specimen Description BLOOD RIGHT ARM   Final   Special Requests BOTTLES DRAWN AEROBIC AND ANAEROBIC 10CC   Final   Culture  Setup Time     Final   Value: 05/09/2013 01:08     Performed at [facility]   Culture     Final   Value:        BLOOD CULTURE RECEIVED NO GROWTH TO DATE CULTURE WILL BE HELD FOR 5 DAYS BEFORE ISSUING A FINAL NEGATIVE REPORT     Performed at [facility]   Report Status PENDING   Incomplete  CULTURE, BLOOD (ROUTINE X 2)     Status: None   Collection Time    05/08/13  6:54 PM      Result Value Range Status   Specimen Description BLOOD RIGHT HAND   Final   Special Requests BOTTLES DRAWN AEROBIC ONLY 4CC   Final   Culture   Setup Time     Final   Value: 05/09/2013 01:12     Performed at [facility]   Culture     Final   Value:        BLOOD CULTURE RECEIVED NO GROWTH TO DATE CULTURE WILL BE HELD FOR 5 DAYS BEFORE ISSUING A FINAL NEGATIVE REPORT     Performed at [facility]   Report Status PENDING   Incomplete  FUNGUS CULTURE W SMEAR     Status: None   Collection Time    05/09/13  9:20 AM      Result Value Range Status   Specimen Description PLEURAL FLUID RIGHT   Final   Special Requests PT ON VANCO FLAGYL MAXIPIME   Final   Fungal Smear     Final   Value: NO YEAST OR FUNGAL ELEMENTS SEEN     Performed at [facility]   Culture     Final   Value: CULTURE IN PROGRESS FOR FOUR WEEKS     Performed at [facility]   Report Status PENDING   Incomplete  AFB CULTURE WITH SMEAR     Status: None   Collection Time    05/09/13  9:20 AM      Result Value Range Status   Specimen Description PLEURAL FLUID RIGHT   Final   Special Requests PT ON VANCO FLAGYL MAXIPIME   Final   ACID FAST SMEAR     Final   Value: NO ACID FAST BACILLI SEEN     Performed at [facility]   Culture     Final   Value: CULTURE WILL BE EXAMINED FOR 6 WEEKS BEFORE ISSUING A FINAL REPORT     Performed at [facility]   Report Status PENDING   Incomplete  BODY FLUID CULTURE     Status: None   Collection Time    05/09/13  9:20 AM      Result Value Range Status   Specimen Description PLEURAL FLUID RIGHT   Final   Special Requests PT ON VANCO FLAGYL MAXIPIME   Final  Gram Stain     Final   Value: ABUNDANT WBC PRESENT,BOTH PMN AND MONONUCLEAR     ABUNDANT GRAM POSITIVE COCCI IN CLUSTERS     Gram Stain Report Called to,Read Back By and Verified With: Gram Stain Report Called to,Read Back By and Verified With:  PUJA PATEL @0747  ON 05/10/13 BY SMIAS     Performed at [facility]   Culture     Final   Value: NO GROWTH 1 DAY     Performed at [facility]   Report Status PENDING    Incomplete  FUNGUS CULTURE W SMEAR     Status: None   Collection Time    05/09/13  9:26 AM      Result Value Range Status   Specimen Description PLEURAL FLUID RIGHT   Final   Special Requests PT ON VANCO FLAGYL MAXIPIME   Final   Fungal Smear     Final   Value: NO YEAST OR FUNGAL ELEMENTS SEEN     Performed at [facility]   Culture     Final   Value: CULTURE IN PROGRESS FOR FOUR WEEKS     Performed at [facility]   Report Status PENDING   Incomplete  AFB CULTURE WITH SMEAR     Status: None   Collection Time    05/09/13  9:26 AM      Result Value Range Status   Specimen Description PLEURAL FLUID RIGHT   Final   Special Requests PT ON VANCO FLAGYL MAXIPIME   Final   ACID FAST SMEAR     Final   Value: NO ACID FAST BACILLI SEEN     Performed at [facility]   Culture     Final   Value: CULTURE WILL BE EXAMINED FOR 6 WEEKS BEFORE ISSUING A FINAL REPORT     Performed at [facility]   Report Status PENDING   Incomplete  BODY FLUID CULTURE     Status: None   Collection Time    05/09/13  9:26 AM      Result Value Range Status   Specimen Description PLEURAL FLUID RIGHT   Final   Special Requests PT ON VANCO FLAGYL MAXIPIME   Final   Gram Stain     Final   Value: RARE WBC PRESENT, PREDOMINANTLY PMN     NO ORGANISMS SEEN     Performed at [facility]   Culture     Final   Value: NO GROWTH 1 DAY     Performed at [facility]   Report Status PENDING   Incomplete  AFB CULTURE WITH SMEAR     Status: None   Collection Time    05/09/13  9:39 AM      Result Value Range Status   Specimen Description TISSUE PLEURAL   Final   Special Requests RIGHT PLEURAL PEEL PT ON VANCO FLAGYL MAXIPIME   Final   ACID FAST SMEAR     Final   Value: NO ACID FAST BACILLI SEEN     Performed at [facility]   Culture     Final   Value: CULTURE WILL BE EXAMINED FOR 6 WEEKS BEFORE ISSUING A FINAL REPORT     Performed at [facility]    Report Status PENDING   Incomplete  FUNGUS CULTURE W SMEAR     Status: None   Collection Time    05/09/13  9:39 AM  Result Value Range Status   Specimen Description TISSUE PLEURAL   Final   Special Requests RIGHT PLEURAL PEEL PT ON VANCO FLAGYL MAXIPIME   Final   Fungal Smear     Final   Value: NO YEAST OR FUNGAL ELEMENTS SEEN     Performed at [facility]   Culture     Final   Value: CULTURE IN PROGRESS FOR FOUR WEEKS     Performed at [facility]   Report Status PENDING   Incomplete  TISSUE CULTURE     Status: None   Collection Time    05/09/13  9:39 AM      Result Value Range Status   Specimen Description TISSUE PLEURAL   Final   Special Requests RIGHT PLEURAL PEEL PT ON VANCO FLAGYL MAXIPIME   Final   Gram Stain     Final   Value: RARE WBC PRESENT,BOTH PMN AND MONONUCLEAR     NO ORGANISMS SEEN     Performed at [facility]   Culture     Final   Value: NO GROWTH 1 DAY     Performed at [facility]   Report Status PENDING   Incomplete  ANAEROBIC CULTURE     Status: None   Collection Time    05/09/13  9:39 AM      Result Value Range Status   Specimen Description TISSUE PLEURAL   Final   Special Requests RIGHT PLEURAL PEEL PT ON VANCO FLAYGYL MAXIPIME   Final   Gram Stain PENDING   Incomplete   Culture     Final   Value: NO ANAEROBES ISOLATED; CULTURE IN PROGRESS FOR 5 DAYS     Performed at [facility]   Report Status PENDING   Incomplete  AFB CULTURE WITH SMEAR     Status: None   Collection Time    05/09/13  9:52 AM      Result Value Range Status   Specimen Description TISSUE PLEURAL   Final   Special Requests PARIETAL PLEURAL PEEL PT ON VANCO FLAGYL MAXIPIME   Final   ACID FAST SMEAR     Final   Value: NO ACID FAST BACILLI SEEN     Performed at [facility]   Culture     Final   Value: CULTURE WILL BE EXAMINED FOR 6 WEEKS BEFORE ISSUING A FINAL REPORT     Performed at [facility]   Report Status PENDING    Incomplete  FUNGUS CULTURE W SMEAR     Status: None   Collection Time    05/09/13  9:52 AM      Result Value Range Status   Specimen Description TISSUE PLEURAL   Final   Special Requests PARIETAL PLEURAL PEEL PT ON VANCO FLAGYL MAXIPIME   Final   Fungal Smear     Final   Value: NO YEAST OR FUNGAL ELEMENTS SEEN     Performed at [facility]   Culture     Final   Value: CULTURE IN PROGRESS FOR FOUR WEEKS     Performed at [facility]   Report Status PENDING   Incomplete  ANAEROBIC CULTURE     Status: None   Collection Time    05/09/13  9:52 AM      Result Value Range Status   Specimen Description TISSUE PLEURAL   Final   Special Requests PARIETAL PLEURAL PEEL PT ON VANCO FLAGYL MAXIPIME   Final  Gram Stain     Final   Value: RARE WBC PRESENT,BOTH PMN AND MONONUCLEAR     NO ORGANISMS SEEN     Performed at [facility]   Culture     Final   Value: NO ANAEROBES ISOLATED; CULTURE IN PROGRESS FOR 5 DAYS     Performed at [facility]   Report Status PENDING   Incomplete  TISSUE CULTURE     Status: None   Collection Time    05/09/13  9:52 AM      Result Value Range Status   Specimen Description TISSUE PLEURAL   Final   Special Requests PARIETAL PLEURAL PEEL PT ON VANCO FLAGYL MAXIPIME   Final   Gram Stain     Final   Value: RARE WBC PRESENT,BOTH PMN AND MONONUCLEAR     NO ORGANISMS SEEN     Performed at [facility]   Culture     Final   Value: NO GROWTH 1 DAY     Performed at [facility]   Report Status PENDING   Incomplete  AFB CULTURE WITH SMEAR     Status: None   Collection Time    05/09/13 11:06 AM      Result Value Range Status   Specimen Description TISSUE LUNG RIGHT   Final   Special Requests RIGHT LOWER LOBE PT ON VANCOMYCIN,FLAGYL,MAXIPIME   Final   ACID FAST SMEAR     Final   Value: NO ACID FAST BACILLI SEEN     Performed at [facility]   Culture     Final   Value: CULTURE WILL BE EXAMINED FOR 6 WEEKS  BEFORE ISSUING A FINAL REPORT     Performed at [facility]   Report Status PENDING   Incomplete  FUNGUS CULTURE W SMEAR     Status: None   Collection Time    05/09/13 11:06 AM      Result Value Range Status   Specimen Description TISSUE LUNG RIGHT   Final   Special Requests RIGHT LOWER LOBE PT ON VANCOMYCIN,FLAGYL,MAXIPIME   Final   Fungal Smear     Final   Value: NO YEAST OR FUNGAL ELEMENTS SEEN     Performed at [facility]   Culture     Final   Value: CULTURE IN PROGRESS FOR FOUR WEEKS     Performed at [facility]   Report Status PENDING   Incomplete  ANAEROBIC CULTURE     Status: None   Collection Time    05/09/13 11:06 AM      Result Value Range Status   Specimen Description TISSUE LUNG RIGHT   Final   Special Requests RIGHT LOWER LOBE PT ON VANCOMYCIN,FLAGYL,MAXIPIME   Final   Gram Stain     Final   Value: RARE WBC PRESENT,BOTH PMN AND MONONUCLEAR     NO ORGANISMS SEEN     Performed at [facility]   Culture     Final   Value: NO ANAEROBES ISOLATED; CULTURE IN PROGRESS FOR 5 DAYS     Performed at [facility]   Report Status PENDING   Incomplete  TISSUE CULTURE     Status: None   Collection Time    05/09/13 11:06 AM      Result Value Range Status   Specimen Description TISSUE LUNG RIGHT   Final   Special Requests RIGHT LOWER LOBE PT ON VANCOMYCIN,FLAGYL,MAXIPIME   Final   Gram Stain  Final   Value: RARE WBC PRESENT,BOTH PMN AND MONONUCLEAR     NO ORGANISMS SEEN     Performed at [facility]   Culture     Final   Value: NO GROWTH 1 DAY     Performed at [facility]   Report Status PENDING   Incomplete   Medical History: Past Medical History  Diagnosis Date  . Diabetes mellitus   . History of stress test 01/2010    normal myoview stress test  . Chronic back pain   . Depression   . Chronic knee pain   . Hypertension    Medications:  Scheduled:  . aztreonam  1 g Intravenous Q8H  . bisacodyl  10 mg  Oral Daily  . enoxaparin (LOVENOX) injection  40 mg Subcutaneous Q24H  . fentaNYL   Intravenous Q4H  . guaiFENesin  1,200 mg Oral BID  . insulin aspart  0-15 Units Subcutaneous TID WC  . insulin aspart  0-5 Units Subcutaneous QHS  . insulin aspart  4 Units Subcutaneous TID WC  . insulin detemir  25 Units Subcutaneous BID  . levalbuterol  0.63 mg Nebulization Q6H  . metronidazole  500 mg Intravenous Q8H  . sodium chloride  3 mL Intravenous Q12H   Assessment: 57yo female who was recently hospitalized with fever and body aches.  Pt was discharged and now readmitted with fever and pain.    Started on empiric antibiotics for HCAP with loculated pleural effusion, sepsis and possible bacteremia.  Cultures still growing.  Pharmacy asked to change cefepime to azactam.  Vancomycin 11/9 >> Cefepime 11/9 >>11/13 Flagyl 11/9 >> Azactam 11/13  Goal of Therapy:  Vancomycin trough level 15-20 mcg/ml  Plan:  1. Azactam 1g IV q 8 hrs. 2. F/u vancomycin trough level tonight. 3. Continue to follow cultures, renal function, and clinical course.  Tad Moore, BCPS  Clinical Pharmacist Pager [phone]  05/10/2013 10:24 PM   Addendum: Vancomycin trough 30.6 on vancomycin 1g q 12 hrs.  Suspect elevated due to worsening renal function as her Scr has been rising.  Will d/c standing vancomycin dose for now, recheck vancomycin level in the AM to determine need for further dosing.  Tad Moore, BCPS  Clinical Pharmacist Pager [phone]  05/10/2013 10:27 PM

## 2013-05-10 NOTE — Progress Notes (Signed)
+   Body Fluids Cultures for abundant WBC (poly/mono) and Gram +  Cocci Clusters

## 2013-05-10 NOTE — Progress Notes (Signed)
PM ROUNDS  No significant change in mental status  BP 107/53  Pulse 116  Temp(Src) 98.1 F (36.7 C) (Oral)  Resp 23  Ht 5\' 5"  (1.651 m)  Wt 199 lb 1.2 oz (90.3 kg)  BMI 33.13 kg/m2  SpO2 99%   Intake/Output Summary (Last 24 hours) at 05/10/13 1710 Last data filed at 05/10/13 1347  Gross per 24 hour  Intake 2695.67 ml  Output   2265 ml  Net 430.67 ml   Maxipime changed to aztreonam  Not able to cooperate with IS or flutter very well  Minimize narcotics

## 2013-05-10 NOTE — Progress Notes (Signed)
Pharmacy notified of vancomycin trough level, orders to hold vancomycin dose for the remainder of the night. CCM notified of pt HR elevated into the upper 120's and bp in the low to mid 90's. No new orders received. Will continue to monitor. Pt currently comfortable and resting.

## 2013-05-10 NOTE — Consult Note (Signed)
PULMONARY  / CRITICAL CARE MEDICINE  Name: Kelli Summers MRN: [id number] DOB: 04-24-1956    ADMISSION DATE:  05/06/2013 CONSULTATION DATE:  05/10/2013  REFERRING MD :  Garnetta Buddy PRIMARY SERVICE:  TRH  CHIEF COMPLAINT:  Acute encephalopathy  BRIEF PATIENT DESCRIPTION: 57 yo with past medical history of chronic pain, depression and DM presenting to [facility] ED with malaise, fever, chest pain, dyspnea and hyperglycemia.  Chest imaging revealed loculated effusion.s transferred to [facility] under TCTS and underwent VATS / decortication.  She exhibited fluctuating levels of consciousness pre and post operatively.  PCCM was asked to evaluate.  SIGNIFICANT EVENTS / STUDIES:  11/09  Presented to [facility] with malaise, fever, dyspnea and hyperglycemia 11/10  Chest CT >>>  Loculated right pleural effusion with associated atelectatic/ consolidated lung. This has a complex appearance. A few small bubbles of air within the presumed pleural fluid these is suspicion for a bronchopleural fistula. There is a small left pleural effusion with minor dependent subsegmental atelectasis. 11/11  Transferred to [facility] 11/12  VATS / decortication  LINES / TUBES: Foley 11/12 >>> R IJ CVL 11/12 >>> L rad A-line 11/12 >>> R chest tube x 2 11/2 >>>  CULTURES: 11/10 Urine >>> neg 11/11 Blood >>> 11/12 Pleural fluid >>>  ANTIBIOTICS: Cefepime  11/9 >>> 11/13 Vancomycin 11/10 >>> Flagyl 11/9 >>> Aztreonam 11/13 >>>  The patient is encephalopathic and unable to provide history, which was obtained for available medical records.  HISTORY OF PRESENT ILLNESS:  57 yo with past medical history of chronic pain, depression and DM presenting to [facility] ED with malaise, fever, chest pain, dyspnea and hyperglycemia.  Chest imaging revealed loculated effusion.s transferred to [facility] under TCTS and underwent VATS / decortication.  She exhibited fluctuating levels of consciousness pre and post operatively.  PCCM was asked to evaluate.  PAST MEDICAL HISTORY :   Past Medical History  Diagnosis Date  . Diabetes mellitus   . History of stress test 01/2010    normal myoview stress test  . Chronic back pain   . Depression   . Chronic knee pain   . Hypertension    Past Surgical History  Procedure Laterality Date  . Back surgery    . Cholecystectomy    . Abdominal hysterectomy     Prior to Admission medications   Medication Sig Start Date End Date Taking? Authorizing Provider  diazepam (VALIUM) 10 MG tablet Take 10 mg by mouth daily as needed for anxiety.    Yes Historical Provider, MD  insulin detemir (LEVEMIR) 100 UNIT/ML injection Inject 0.2 mLs (20 Units total) into the skin at bedtime. 05/02/13  Yes Erick Blinks, MD  insulin lispro (HUMALOG KWIKPEN) 100 UNIT/ML SOPN Inject 6-10 Units into the skin 3 (three) times daily with meals.   Yes Historical Provider, MD  pantoprazole (PROTONIX) 40 MG tablet Take 1 tablet (40 mg total) by mouth daily. 05/02/13  Yes Erick Blinks, MD  DULoxetine (CYMBALTA) 60 MG capsule Take 60 mg by mouth daily.    Historical Provider, MD  oxyCODONE-acetaminophen (ROXICET) 5-325 MG per tablet Take 1 tablet by mouth every 6 (six) hours as needed for severe pain. 05/02/13   Erick Blinks, MD   Allergies  Allergen Reactions  . Ciprofloxacin Itching  . Hydrocodone-Acetaminophen Itching  . Penicillins Itching  . Percocet [Oxycodone-Acetaminophen] Itching   FAMILY HISTORY:  History reviewed. No pertinent family history.  SOCIAL HISTORY:  reports that she has been smoking Cigarettes.  She has been smoking about 0.30 packs  per day. She uses smokeless tobacco. She reports that she drinks alcohol. She reports that she uses illicit drugs (Marijuana) about twice per week.  REVIEW OF SYSTEMS:  Unable to provide.  INTERVAL HISTORY:  VITAL SIGNS: Temp:  [98.1 F (36.7 C)-98.9 F (37.2 C)] 98.2 F (36.8 C) (11/13 1127) Pulse Rate:  [110-120] 112 (11/13 1300) Resp:  [14-31] 18 (11/13 1300) BP: (97-132)/(45-108) 105/55  mmHg (11/13 1300) SpO2:  [94 %-100 %] 100 % (11/13 1300) Arterial Line BP: (102-137)/(49-64) 118/56 mmHg (11/13 1300)  HEMODYNAMICS:   VENTILATOR SETTINGS:   INTAKE / OUTPUT: Intake/Output     11/12 0701 - 11/13 0700 11/13 0701 - 11/14 0700   I.V. (mL/kg) 3550 (39.3) 445.7 (4.9)   IV Piggyback 850 300   Total Intake(mL/kg) 4400 (48.7) 745.7 (8.3)   Urine (mL/kg/hr) 1845 (0.9) 490 (0.8)   Chest Tube 560 (0.3) 120 (0.2)   Total Output 2405 610   Net +1995 +135.7         PHYSICAL EXAMINATION: General:  No acute distresses, resting comfortably Neuro:  Sleepy but arouses to stimulation, confused, non focal, gag / cough intact HEENT:  PERRL, moist membranes, periorbital edema Cardiovascular:  RRR, no m/r/g Lungs:  Bilateral diminished air entry, no w/r/r Abdomen:  Soft, nontender, bowel sounds diminished Musculoskeletal:  Moves all extremities, no edema Skin:  Intact  LABS:  CBC  Recent Labs Lab 05/08/13 1930 05/09/13 1305 05/10/13 0355  WBC 15.9* 20.3* 20.2*  HGB 9.1* 8.2* 7.6*  HCT 25.8* 24.6* 22.5*  PLT 217 222 290   Coag's  Recent Labs Lab 05/07/13 0507 05/08/13 2030  APTT 36 36  INR 1.11 1.14   BMET  Recent Labs Lab 05/08/13 2030 05/09/13 1305 05/10/13 0355  NA 132* 130* 131*  K 3.5 4.2 4.1  CL 99 99 100  CO2 20 20 20   BUN 20 22 25*  CREATININE 1.00 1.05 1.18*  GLUCOSE 209* 335* 292*   Electrolytes  Recent Labs Lab 05/06/13 1649  05/08/13 2030 05/09/13 1305 05/10/13 0355  CALCIUM 8.7  < > 8.3* 8.1* 7.9*  MG 1.4*  --   --   --   --   < > = values in this interval not displayed. Sepsis Markers  Recent Labs Lab 05/06/13 2037  LATICACIDVEN 1.8   ABG  Recent Labs Lab 05/09/13 1612 05/09/13 2003 05/10/13 0353  PHART 7.317* 7.350 7.383  PCO2ART 40.5 43.2 37.5  PO2ART 62.0* 86.0 85.0   Liver Enzymes  Recent Labs Lab 05/06/13 1649 05/07/13 0507 05/08/13 2030  AST 15 18 14   ALT 15 16 13   ALKPHOS 211* 215* 207*  BILITOT  0.6 0.6 0.5  ALBUMIN 1.5* 1.5* 1.4*   Cardiac Enzymes No results found for this basename: TROPONINI, PROBNP,  in the last 168 hours  Glucose  Recent Labs Lab 05/09/13 1416 05/09/13 1638 05/09/13 1932 05/09/13 2211 05/10/13 0723 05/10/13 1129  GLUCAP 284* 295* 257* 249* 255* 226*   CXR:  11/13 >>> R chest tube in place x 2, bilateral effusions and airspace disease R>L  ASSESSMENT / PLAN:  Acute encephalopathy. Possibly secondary to cefepime or Cymbalta.  Also thyroid function is unknown and periorbital edema.  Very low albumin / impaired liver synthetic function - possibly hepatic.  Should always consider illicit substances.  Doubt secondary to hyperglycemia.  Unlikely focal CNS lesion / infection.   -->  Hold Cymbalta  -->  Cefepime d/c'd, Aztreonam started  -->  TSH, free T4, total T3  -->  Ammonia   -->  Drug screen  -->  Would defer CNS imaging  -->  Protects airway  Loculated effusion / empyema s/p VATS / decortication   -->  Per TCTS  -->  Would limit opioids / benzodiazepines  DM / Hyperglycemia   -->  Consider Insulin gtt for improved glycemic control  Anemia of acute blood loss   -->  Per TCTS  I have personally obtained history, examined patient, evaluated and interpreted laboratory and imaging results, reviewed medical records, formulated assessment / plan and placed orders.  Lonia Farber, MD Pulmonary and Critical Care Medicine [facility] Pager: [phone]  05/10/2013, 2:05 PM

## 2013-05-10 NOTE — Progress Notes (Signed)
Patient's overall neuro status has NOT changed during night shift.  No neuro changes, however patient has been lethargic, moans, and does follow most commands, can move all extremities. MD aware  + body fluids abundant for WBC and gram + cocci clusters, MD aware.

## 2013-05-10 NOTE — Progress Notes (Signed)
**Note De-Identified  Obfuscation** RT note; RT attempted  Flutter valve; patient had poor effort.

## 2013-05-10 NOTE — Progress Notes (Signed)
NUTRITION FOLLOW UP  Intervention:   Advance diet as medically appropriate  RD to follow for nutrition care plan, add supplements accordingly  Nutrition Dx:   Inadequate oral intake now related to inability to eat as evidenced by NPO status, ongoing  Goal:   Pt to meet >/= 90% of their estimated nutrition needs, currently unmet  Monitor:   PO diet advancement & intake, weight, labs, I/O's  Assessment:   Patient with PMH of DM, depression, chronic back pain and HTN; admitted to [facility] for dehydration and severe hyperglycemia; began feeling worse with fevers and right sided pleuritic chest pain -- CT of chest showed large loculated right para-pneumonic effusion/empyema.  Patient s/p procedures 11/12: VIDEO ASSISTED THORACOSCOPY (VATS) WEDGE RESECTION RIGHT LOWER LOBE (Right)  Patient transferred from [facility] then to 2S Surgical ICU.  Patient lethargic upon RD visit.  Remains NPO post-op.  Patient with poor PO intake, nausea and vomiting upon initial nutrition assessment at [facility], 11/10.  Would benefit from addition of nutrition supplements when/as able.  Height: Ht Readings from Last 1 Encounters:  05/09/13 5\' 5"  (1.651 m)    Weight Status:   Wt Readings from Last 1 Encounters:  05/09/13 199 lb 1.2 oz (90.3 kg)    Body mass index is 33.13 kg/(m^2).  Re-estimated needs:  Kcal: 1900-2100 Protein: 105-115 gm Fluid: 1.9-2.1 L  Skin: surgical chest incision  Diet Order: NPO   Intake/Output Summary (Last 24 hours) at 05/10/13 1201 Last data filed at 05/10/13 1100  Gross per 24 hour  Intake 3779.17 ml  Output   2515 ml  Net 1264.17 ml    Labs:   Recent Labs Lab 05/06/13 1649  05/08/13 2030 05/09/13 1305 05/10/13 0355  NA 131*  < > 132* 130* 131*  K 2.3*  < > 3.5 4.2 4.1  CL 99  < > 99 99 100  CO2 20  < > 20 20 20   BUN 16  < > 20 22 25*  CREATININE 0.91  < > 1.00 1.05 1.18*  CALCIUM 8.7  < > 8.3* 8.1* 7.9*  MG 1.4*  --   --   --   --   GLUCOSE 322*   < > 209* 335* 292*  < > = values in this interval not displayed.  CBG (last 3)   Recent Labs  05/09/13 1932 05/09/13 2211 05/10/13 0723  GLUCAP 257* 249* 255*    Scheduled Meds: . acetaminophen  1,000 mg Oral Q6H   Or  . acetaminophen (TYLENOL) oral liquid 160 mg/5 mL  1,000 mg Oral Q6H  . bisacodyl  10 mg Oral Daily  . ceFEPime (MAXIPIME) IV  1 g Intravenous Q8H  . DULoxetine  60 mg Oral Daily  . enoxaparin (LOVENOX) injection  40 mg Subcutaneous Q24H  . fentaNYL   Intravenous Q4H  . guaiFENesin  1,200 mg Oral BID  . insulin aspart  0-15 Units Subcutaneous TID WC  . insulin aspart  0-5 Units Subcutaneous QHS  . insulin detemir  25 Units Subcutaneous BID  . levalbuterol  0.63 mg Nebulization Q6H  . metronidazole  500 mg Intravenous Q8H  . sodium chloride  3 mL Intravenous Q12H  . vancomycin  1,000 mg Intravenous Q12H    Continuous Infusions: . 0.45 % NaCl with KCl 20 mEq / L 50 mL/hr (05/10/13 0935)    Maureen Chatters, RD, LDN Pager #: [phone] After-Hours Pager #: [phone]

## 2013-05-10 NOTE — Progress Notes (Signed)
1 Day Post-Op Procedure(s) (LRB): VIDEO ASSISTED THORACOSCOPY (VATS)/DECORTICATION (Right) WEDGE RESECTION RIGHT LOWER LOBE (Right) Subjective: Still lethargic despite only a small amount of narcotics overnight  Objective: Vital signs in last 24 hours: Temp:  [97 F (36.1 C)-98.9 F (37.2 C)] 98.1 F (36.7 C) (11/13 0720) Pulse Rate:  [108-120] 113 (11/13 0700) Cardiac Rhythm:  [-] Sinus tachycardia (11/13 0000) Resp:  [10-31] 16 (11/13 0700) BP: (97-131)/(49-108) 116/58 mmHg (11/13 0700) SpO2:  [92 %-100 %] 98 % (11/13 0700) Arterial Line BP: (102-137)/(48-68) 128/59 mmHg (11/13 0700)  Hemodynamic parameters for last 24 hours:    Intake/Output from previous day: 11/12 0701 - 11/13 0700 In: 4400 [I.V.:3550; IV Piggyback:850] Out: 2405 [Urine:1845; Chest Tube:560] Intake/Output this shift: Total I/O In: 100 [IV Piggyback:100] Out: -   General appearance: lethargic Neurologic: follows commands, moves all 4 extremities, no focal deficits but remains lethargic as she was preop Heart: tachy, regular Lungs: diminished breath sounds bilateral bases R>L no air leak  Lab Results:  Recent Labs  05/09/13 1305 05/10/13 0355  WBC 20.3* 20.2*  HGB 8.2* 7.6*  HCT 24.6* 22.5*  PLT 222 290   BMET:  Recent Labs  05/09/13 1305 05/10/13 0355  NA 130* 131*  K 4.2 4.1  CL 99 100  CO2 20 20  GLUCOSE 335* 292*  BUN 22 25*  CREATININE 1.05 1.18*  CALCIUM 8.1* 7.9*    PT/INR:  Recent Labs  05/08/13 2030  LABPROT 14.4  INR 1.14   ABG    Component Value Date/Time   PHART 7.383 05/10/2013 0353   HCO3 22.4 05/10/2013 0353   TCO2 24 05/10/2013 0353   ACIDBASEDEF 2.0 05/10/2013 0353   O2SAT 96.0 05/10/2013 0353   CBG (last 3)   Recent Labs  05/09/13 1932 05/09/13 2211 05/10/13 0723  GLUCAP 257* 249* 255*    Assessment/Plan: S/P Procedure(s) (LRB): VIDEO ASSISTED THORACOSCOPY (VATS)/DECORTICATION (Right) WEDGE RESECTION RIGHT LOWER LOBE (Right) POD # 1  decortication ID- growing GPC from pleural fluid c/w staph- on vanco, maxipime and flagyl pending results  CV- mildly tachycardic  RESP- need aggressive pulmonary hygiene- add flutter and mucinex  RENAL- creatinine up minimally, lytes OK  ENDO- CBG are markedly elevated- will change levemir to BID  OOB  SCD + lovenox for DVT prophylaxis  NEURO- not sure what to make of neuro status- she has no focal deficits, will minimize narcotics as much as possible, may be sepsis related  Will ask CCM to see   LOS: 4 days    Neesa Knapik C 05/10/2013

## 2013-05-10 NOTE — Progress Notes (Signed)
CRITICAL VALUE ALERT  Critical value received:  Vancomycin Trough  Date of notification:  05/10/13  Time of notification:  2316  Critical value read back:yes  Nurse who received alert:  Viviano Simas  MD notified (1st page):  Delford Field  Time of first page:  2316  MD notified (2nd page):  Time of second page:  Responding MD:  Delford Field  Time MD responded:  2316

## 2013-05-10 NOTE — Progress Notes (Addendum)
[facility] Progress Note  TEAM 1 - Stepdown ICU Team   Kelli Summers ZOX:[id number] DOB: Nov 23, 1955 DOA: 05/06/2013 PCP: Colette Ribas, MD  Brief narrative: 57 y.o. female with a past medical history of, diabetes, gastroparesis, who was recently hospitalized for hyperglycemia. She recently returned from [place] on October 29 and was experiencing fever and body aches with hyperglycemia. There was a concern about Chikungunya virus. However, she was noted to be afebrile in the hospital. She was discharged after her blood sugars were adequately controlled. She was doing well on her discharge day.   This past Thursday her symptoms returned consisting of body aches, joint pains and headaches. Later she developed a temperature of 101F. She's been short of breath and has had chest pain, especially when lying on the right side. However, she denied any cough. She's had about 4 episodes of nausea and vomiting-non bloody. She's had abdominal pain in the lower part of the abdomen, which is 6/10 in intensity, dull pain without any radiation.  Denied any diarrhea. Last bowel movement was 24 hours prior to presentation. She avoided coming to the hospital but due to significant weakness she decided to come in. She endorsed poor oral intake for several days prior to presentation.  Work up after admission revealed HCAP with a loculated pleural effusion. IR was consulted for consideration for US guided thoracentesis. Pulmonologist Dr. Juanetta Gosling was following and after reviewing the CT chest cancelled the thora and contacted TCTS/[facility] and the pt was then transferred to [facility] for planned VATS.   Assessment/Plan: Active Problems:   Acute respiratory failure with hypoxia due to:   A) HCAP (healthcare-associated pneumonia)   B) Loculated pleural effusion -post VATS with drainage of early organizing empyema and decortication 11/12 -cont supportive care/oxygen -cont empiric anbx's -CT's and  pain management per TCTS   Metabolic encephalopathy -? due to narcs vs sepsis -follow   ? Bacteremia/Sepsis -1/2 bottles positive for GPCs in clusters -repeat blood cx's from 11/11 pending/NGTD -body fluid cx's (1/2) with abundant GPCs in clusters so suspect truly has bacteremia -consider ECHO -BP soft- see below- if low BP persists consider check cortisol     Diabetes mellitus type 2, uncontrolled -on SSI and Levemir pre op-resume post op - add mealtime novolog to sliding scale as sugars in 200s    Dehydration with hyponatremia -cont IVFs and increase to 100/hr- urine concentrated  -BP soft so will bolus with 500 cc -  lactic acid normal    Hypokalemia -replete prn and follow lytes   DVT prophylaxis: Discretion of surgical team Code Status: Full Family Communication: Patient currently in the operating room Disposition Plan/Expected LOS: Postop plan is to transfer patient to 2 south Isolation: None Nutritional Status: Acute protein calorie malnutrition related to acute illness  Consultants: TCTS  Procedures: VATS with decortication and drainage of empyema   11/12  Antibiotics: Cefepime 11/09 >>> Flagyl 11/09 >>> Vancomycin 11/09 >>>  HPI/Subjective: sleepy despite > 24 hrs post op  Objective: Blood pressure 96/55, pulse 118, temperature 98.1 F (36.7 C), temperature source Oral, resp. rate 19, height 5\' 5"  (1.651 m), weight 199 lb 1.2 oz (90.3 kg), SpO2 91.00%.  Intake/Output Summary (Last 24 hours) at 05/10/13 1533 Last data filed at 05/10/13 1347  Gross per 24 hour  Intake 2695.67 ml  Output   2265 ml  Net 430.67 ml     Exam: General: No acute respiratory distress Lungs: Clear to auscultation, 4 L, non labored Cardiovascular: Regular tachycardic  rate and sinus rhythm without murmur gallop or rub normal S1 and S2, no peripheral edema or JVD-BP soft Abdomen: Nontender, nondistended, soft, bowel sounds positive, no rebound, no ascites, no appreciable  mass Musculoskeletal: No significant cyanosis, clubbing of bilateral lower extremities Neurological: Sedated,sponatenously moves all extremities x 4 without obvious focal neurological deficits when stimulated  Scheduled Meds:  Scheduled Meds: . acetaminophen  1,000 mg Oral Q6H   Or  . acetaminophen (TYLENOL) oral liquid 160 mg/5 mL  1,000 mg Oral Q6H  . bisacodyl  10 mg Oral Daily  . ceFEPime (MAXIPIME) IV  1 g Intravenous Q8H  . DULoxetine  60 mg Oral Daily  . enoxaparin (LOVENOX) injection  40 mg Subcutaneous Q24H  . fentaNYL   Intravenous Q4H  . guaiFENesin  1,200 mg Oral BID  . insulin aspart  0-15 Units Subcutaneous TID WC  . insulin aspart  0-5 Units Subcutaneous QHS  . insulin detemir  25 Units Subcutaneous BID  . levalbuterol  0.63 mg Nebulization Q6H  . metronidazole  500 mg Intravenous Q8H  . sodium chloride  3 mL Intravenous Q12H  . vancomycin  1,000 mg Intravenous Q12H   Continuous Infusions: . 0.45 % NaCl with KCl 20 mEq / L 50 mL/hr at 05/10/13 1200    **Reviewed in detail by the Attending Physician  Data Reviewed: Basic Metabolic Panel:  Recent Labs Lab 05/06/13 1649 05/07/13 0507 05/08/13 0817 05/08/13 2030 05/09/13 1305 05/10/13 0355  NA 131* 133* 132* 132* 130* 131*  K 2.3* 3.5 3.2* 3.5 4.2 4.1  CL 99 102 101 99 99 100  CO2 20 20 21 20 20 20   GLUCOSE 322* 293* 240* 209* 335* 292*  BUN 16 16 20 20 22  25*  CREATININE 0.91 1.05 1.01 1.00 1.05 1.18*  CALCIUM 8.7 8.4 8.6 8.3* 8.1* 7.9*  MG 1.4*  --   --   --   --   --    Liver Function Tests:  Recent Labs Lab 05/06/13 1649 05/07/13 0507 05/08/13 2030  AST 15 18 14   ALT 15 16 13   ALKPHOS 211* 215* 207*  BILITOT 0.6 0.6 0.5  PROT 5.6* 5.5* 5.7*  ALBUMIN 1.5* 1.5* 1.4*    Recent Labs Lab 05/06/13 2037  LIPASE 10*   No results found for this basename: AMMONIA,  in the last 168 hours CBC:  Recent Labs Lab 05/06/13 1649 05/07/13 0507 05/08/13 0817 05/08/13 1930 05/09/13 1305  05/10/13 0355  WBC 13.5* 19.5* 18.2* 15.9* 20.3* 20.2*  NEUTROABS 12.7*  --   --   --   --   --   HGB 10.8* 9.8* 9.4* 9.1* 8.2* 7.6*  HCT 31.9* 29.0* 27.9* 25.8* 24.6* 22.5*  MCV 87.2 87.6 88.3 87.2 88.8 88.9  PLT 192 196 212 217 222 290   Cardiac Enzymes: No results found for this basename: CKTOTAL, CKMB, CKMBINDEX, TROPONINI,  in the last 168 hours BNP (last 3 results) No results found for this basename: PROBNP,  in the last 8760 hours CBG:  Recent Labs Lab 05/09/13 1638 05/09/13 1932 05/09/13 2211 05/10/13 0723 05/10/13 1129  GLUCAP 295* 257* 249* 255* 226*    Recent Results (from the past 240 hour(s))  CULTURE, BLOOD (ROUTINE X 2)     Status: None   Collection Time    05/06/13 11:38 PM      Result Value Range Status   Specimen Description BLOOD RIGHT HAND   Final   Special Requests BOTTLES DRAWN AEROBIC AND ANAEROBIC 5CC  Final   Culture  Setup Time     Final   Value: 05/09/2013 00:11     Performed at [facility]   Culture     Final   Value: GRAM POSITIVE COCCI IN CLUSTERS     Note: Gram Stain Report Called to,Read Back By and Verified With: J BRODY AT [facility] 1707 05/08/13 BY Wynonia Lawman Performed at [facility]     Performed at [facility]   Report Status PENDING   Incomplete  CULTURE, BLOOD (ROUTINE X 2)     Status: None   Collection Time    05/06/13 11:47 PM      Result Value Range Status   Specimen Description BLOOD RIGHT ANTECUBITAL   Final   Special Requests BOTTLES DRAWN AEROBIC AND ANAEROBIC 8CC   Final   Culture NO GROWTH 3 DAYS   Final   Report Status PENDING   Incomplete  URINE CULTURE     Status: None   Collection Time    05/07/13  2:07 AM      Result Value Range Status   Specimen Description URINE, CLEAN CATCH   Final   Special Requests NONE   Final   Culture  Setup Time     Final   Value: 05/07/2013 03:40     Performed at [facility]   Culture     Final   Value: Multiple bacterial morphotypes present, none  predominant. Suggest appropriate recollection if clinically indicated.     Performed at [facility]   Report Status 05/08/2013 FINAL   Final  CULTURE, BLOOD (ROUTINE X 2)     Status: None   Collection Time    05/08/13  6:46 PM      Result Value Range Status   Specimen Description BLOOD RIGHT ARM   Final   Special Requests BOTTLES DRAWN AEROBIC AND ANAEROBIC 10CC   Final   Culture  Setup Time     Final   Value: 05/09/2013 01:08     Performed at [facility]   Culture     Final   Value:        BLOOD CULTURE RECEIVED NO GROWTH TO DATE CULTURE WILL BE HELD FOR 5 DAYS BEFORE ISSUING A FINAL NEGATIVE REPORT     Performed at [facility]   Report Status PENDING   Incomplete  CULTURE, BLOOD (ROUTINE X 2)     Status: None   Collection Time    05/08/13  6:54 PM      Result Value Range Status   Specimen Description BLOOD RIGHT HAND   Final   Special Requests BOTTLES DRAWN AEROBIC ONLY 4CC   Final   Culture  Setup Time     Final   Value: 05/09/2013 01:12     Performed at [facility]   Culture     Final   Value:        BLOOD CULTURE RECEIVED NO GROWTH TO DATE CULTURE WILL BE HELD FOR 5 DAYS BEFORE ISSUING A FINAL NEGATIVE REPORT     Performed at [facility]   Report Status PENDING   Incomplete  FUNGUS CULTURE W SMEAR     Status: None   Collection Time    05/09/13  9:20 AM      Result Value Range Status   Specimen Description PLEURAL FLUID RIGHT   Final   Special Requests PT ON VANCO FLAGYL MAXIPIME   Final   Fungal Smear  Final   Value: NO YEAST OR FUNGAL ELEMENTS SEEN     Performed at [facility]   Culture     Final   Value: CULTURE IN PROGRESS FOR FOUR WEEKS     Performed at [facility]   Report Status PENDING   Incomplete  BODY FLUID CULTURE     Status: None   Collection Time    05/09/13  9:20 AM      Result Value Range Status   Specimen Description PLEURAL FLUID RIGHT   Final   Special Requests PT ON VANCO FLAGYL  MAXIPIME   Final   Gram Stain     Final   Value: ABUNDANT WBC PRESENT,BOTH PMN AND MONONUCLEAR     ABUNDANT GRAM POSITIVE COCCI IN CLUSTERS     Gram Stain Report Called to,Read Back By and Verified With: Gram Stain Report Called to,Read Back By and Verified With:  PUJA PATEL @0747  ON 05/10/13 BY SMIAS     Performed at [facility]   Culture     Final   Value: NO GROWTH 1 DAY     Performed at [facility]   Report Status PENDING   Incomplete  FUNGUS CULTURE W SMEAR     Status: None   Collection Time    05/09/13  9:26 AM      Result Value Range Status   Specimen Description PLEURAL FLUID RIGHT   Final   Special Requests PT ON VANCO FLAGYL MAXIPIME   Final   Fungal Smear     Final   Value: NO YEAST OR FUNGAL ELEMENTS SEEN     Performed at [facility]   Culture     Final   Value: CULTURE IN PROGRESS FOR FOUR WEEKS     Performed at [facility]   Report Status PENDING   Incomplete  BODY FLUID CULTURE     Status: None   Collection Time    05/09/13  9:26 AM      Result Value Range Status   Specimen Description PLEURAL FLUID RIGHT   Final   Special Requests PT ON VANCO FLAGYL MAXIPIME   Final   Gram Stain     Final   Value: RARE WBC PRESENT, PREDOMINANTLY PMN     NO ORGANISMS SEEN     Performed at [facility]   Culture     Final   Value: NO GROWTH 1 DAY     Performed at [facility]   Report Status PENDING   Incomplete  FUNGUS CULTURE W SMEAR     Status: None   Collection Time    05/09/13  9:39 AM      Result Value Range Status   Specimen Description TISSUE PLEURAL   Final   Special Requests RIGHT PLEURAL PEEL PT ON VANCO FLAGYL MAXIPIME   Final   Fungal Smear     Final   Value: NO YEAST OR FUNGAL ELEMENTS SEEN     Performed at [facility]   Culture     Final   Value: CULTURE IN PROGRESS FOR FOUR WEEKS     Performed at [facility]   Report Status PENDING   Incomplete  TISSUE CULTURE     Status: None    Collection Time    05/09/13  9:39 AM      Result Value Range Status   Specimen Description TISSUE PLEURAL   Final   Special Requests RIGHT PLEURAL PEEL  PT ON VANCO FLAGYL MAXIPIME   Final   Gram Stain     Final   Value: RARE WBC PRESENT,BOTH PMN AND MONONUCLEAR     NO ORGANISMS SEEN     Performed at [facility]   Culture     Final   Value: NO GROWTH 1 DAY     Performed at [facility]   Report Status PENDING   Incomplete  ANAEROBIC CULTURE     Status: None   Collection Time    05/09/13  9:39 AM      Result Value Range Status   Specimen Description TISSUE PLEURAL   Final   Special Requests RIGHT PLEURAL PEEL PT ON VANCO FLAYGYL MAXIPIME   Final   Gram Stain PENDING   Incomplete   Culture     Final   Value: NO ANAEROBES ISOLATED; CULTURE IN PROGRESS FOR 5 DAYS     Performed at [facility]   Report Status PENDING   Incomplete  FUNGUS CULTURE W SMEAR     Status: None   Collection Time    05/09/13  9:52 AM      Result Value Range Status   Specimen Description TISSUE PLEURAL   Final   Special Requests PARIETAL PLEURAL PEEL PT ON VANCO FLAGYL MAXIPIME   Final   Fungal Smear     Final   Value: NO YEAST OR FUNGAL ELEMENTS SEEN     Performed at [facility]   Culture     Final   Value: CULTURE IN PROGRESS FOR FOUR WEEKS     Performed at [facility]   Report Status PENDING   Incomplete  ANAEROBIC CULTURE     Status: None   Collection Time    05/09/13  9:52 AM      Result Value Range Status   Specimen Description TISSUE PLEURAL   Final   Special Requests PARIETAL PLEURAL PEEL PT ON VANCO FLAGYL MAXIPIME   Final   Gram Stain     Final   Value: RARE WBC PRESENT,BOTH PMN AND MONONUCLEAR     NO ORGANISMS SEEN     Performed at [facility]   Culture     Final   Value: NO ANAEROBES ISOLATED; CULTURE IN PROGRESS FOR 5 DAYS     Performed at [facility]   Report Status PENDING   Incomplete  TISSUE CULTURE     Status: None    Collection Time    05/09/13  9:52 AM      Result Value Range Status   Specimen Description TISSUE PLEURAL   Final   Special Requests PARIETAL PLEURAL PEEL PT ON VANCO FLAGYL MAXIPIME   Final   Gram Stain     Final   Value: RARE WBC PRESENT,BOTH PMN AND MONONUCLEAR     NO ORGANISMS SEEN     Performed at [facility]     Final   Value: NO GROWTH 1 DAY     Performed at [facility]   Report Status PENDING   Incomplete  FUNGUS CULTURE W SMEAR     Status: None   Collection Time    05/09/13 11:06 AM      Result Value Range Status   Specimen Description TISSUE LUNG RIGHT   Final   Special Requests RIGHT LOWER LOBE PT ON VANCOMYCIN,FLAGYL,MAXIPIME   Final   Fungal Smear     Final   Value: NO YEAST OR  FUNGAL ELEMENTS SEEN     Performed at [facility]   Culture     Final   Value: CULTURE IN PROGRESS FOR FOUR WEEKS     Performed at [facility]   Report Status PENDING   Incomplete  ANAEROBIC CULTURE     Status: None   Collection Time    05/09/13 11:06 AM      Result Value Range Status   Specimen Description TISSUE LUNG RIGHT   Final   Special Requests RIGHT LOWER LOBE PT ON VANCOMYCIN,FLAGYL,MAXIPIME   Final   Gram Stain     Final   Value: RARE WBC PRESENT,BOTH PMN AND MONONUCLEAR     NO ORGANISMS SEEN     Performed at [facility]   Culture     Final   Value: NO ANAEROBES ISOLATED; CULTURE IN PROGRESS FOR 5 DAYS     Performed at [facility]   Report Status PENDING   Incomplete  TISSUE CULTURE     Status: None   Collection Time    05/09/13 11:06 AM      Result Value Range Status   Specimen Description TISSUE LUNG RIGHT   Final   Special Requests RIGHT LOWER LOBE PT ON VANCOMYCIN,FLAGYL,MAXIPIME   Final   Gram Stain     Final   Value: RARE WBC PRESENT,BOTH PMN AND MONONUCLEAR     NO ORGANISMS SEEN     Performed at [facility]   Culture     Final   Value: NO GROWTH 1 DAY     Performed at [facility]    Report Status PENDING   Incomplete     Studies:  Recent x-ray studies have been reviewed in detail by the Attending Physician     Junious Silk, ANP [facility] Office  [phone] Pager [phone]  **If unable to reach the above provider after paging please contact the Flow Manager @ [phone]  On-Call/Text Page:      [email or web address]      password [email or web address]  If 7PM-7AM, please contact night-coverage [email or web address] Password [facility] 05/10/2013, 3:33 PM   LOS: 4 days    I have examined the patient, reviewed the chart and modified the above note which I agree with.   Nevah Dalal,MD [phone] 05/10/2013, 5:12 PM

## 2013-05-11 ENCOUNTER — Inpatient Hospital Stay (HOSPITAL_COMMUNITY): Payer: 59

## 2013-05-11 DIAGNOSIS — E119 Type 2 diabetes mellitus without complications: Secondary | ICD-10-CM

## 2013-05-11 DIAGNOSIS — B955 Unspecified streptococcus as the cause of diseases classified elsewhere: Secondary | ICD-10-CM

## 2013-05-11 DIAGNOSIS — J869 Pyothorax without fistula: Secondary | ICD-10-CM

## 2013-05-11 DIAGNOSIS — R7881 Bacteremia: Secondary | ICD-10-CM

## 2013-05-11 DIAGNOSIS — A491 Streptococcal infection, unspecified site: Secondary | ICD-10-CM

## 2013-05-11 LAB — COMPREHENSIVE METABOLIC PANEL
ALT: 8 U/L (ref 0–35)
AST: 12 U/L (ref 0–37)
Albumin: 1.5 g/dL — ABNORMAL LOW (ref 3.5–5.2)
Alkaline Phosphatase: 113 U/L (ref 39–117)
BUN: 23 mg/dL (ref 6–23)
CO2: 20 mEq/L (ref 19–32)
Calcium: 7.6 mg/dL — ABNORMAL LOW (ref 8.4–10.5)
Chloride: 102 mEq/L (ref 96–112)
Creatinine, Ser: 1.15 mg/dL — ABNORMAL HIGH (ref 0.50–1.10)
GFR calc Af Amer: 60 mL/min — ABNORMAL LOW (ref >90)
GFR calc non Af Amer: 52 mL/min — ABNORMAL LOW (ref >90)
Glucose, Bld: 159 mg/dL — ABNORMAL HIGH (ref 70–99)
Potassium: 3.3 mEq/L — ABNORMAL LOW (ref 3.5–5.1)
Sodium: 134 mEq/L — ABNORMAL LOW (ref 135–145)
Total Bilirubin: 0.4 mg/dL (ref 0.3–1.2)
Total Protein: 5 g/dL — ABNORMAL LOW (ref 6.0–8.3)

## 2013-05-11 LAB — CBC
HCT: 19.7 % — ABNORMAL LOW (ref 36.0–46.0)
Hemoglobin: 6.9 g/dL — CL (ref 12.0–15.0)
MCH: 30.9 pg (ref 26.0–34.0)
MCHC: 35 g/dL (ref 30.0–36.0)
MCV: 88.3 fL (ref 78.0–100.0)
Platelets: 337 10*3/uL (ref 150–400)
RBC: 2.23 MIL/uL — ABNORMAL LOW (ref 3.87–5.11)
RDW: 14.1 % (ref 11.5–15.5)
WBC: 17.9 10*3/uL — ABNORMAL HIGH (ref 4.0–10.5)

## 2013-05-11 LAB — URINE DRUGS OF ABUSE SCREEN W ALC, ROUTINE (REF LAB)
Amphetamine Screen, Ur: NEGATIVE
Barbiturate Quant, Ur: NEGATIVE
Benzodiazepines.: POSITIVE — AB
Cocaine Metabolites: NEGATIVE
Creatinine,U: 66.5 mg/dL
Ethyl Alcohol: <10 mg/dL (ref <10)
Marijuana Metabolite: NEGATIVE
Methadone: NEGATIVE
Opiate Screen, Urine: NEGATIVE
Phencyclidine (PCP): NEGATIVE
Propoxyphene: NEGATIVE

## 2013-05-11 LAB — GLUCOSE, CAPILLARY
Glucose-Capillary: 153 mg/dL — ABNORMAL HIGH (ref 70–99)
Glucose-Capillary: 127 mg/dL — ABNORMAL HIGH (ref 70–99)
Glucose-Capillary: 80 mg/dL (ref 70–99)

## 2013-05-11 LAB — VANCOMYCIN, RANDOM: Vancomycin Rm: 21.3 ug/mL

## 2013-05-11 LAB — PREPARE RBC (CROSSMATCH)

## 2013-05-11 MED ORDER — SODIUM CHLORIDE 0.9 % IV SOLN
INTRAVENOUS | Status: DC
Start: 2013-05-11 — End: 2013-05-15
  Administered 2013-05-11: 20 mL/h via INTRAVENOUS

## 2013-05-11 MED ORDER — HALOPERIDOL LACTATE 5 MG/ML IJ SOLN: 1.0000 mg | INTRAMUSCULAR | Status: DC | PRN

## 2013-05-11 MED ORDER — FENTANYL CITRATE 0.05 MG/ML IJ SOLN
25.0000 ug | INTRAMUSCULAR | Status: DC | PRN
  Administered 2013-05-11: 25 ug via INTRAVENOUS
  Administered 2013-05-12 (×2): 50 ug via INTRAVENOUS
  Filled 2013-05-11 (×3): qty 2

## 2013-05-11 MED ORDER — INSULIN DETEMIR 100 UNIT/ML ~~LOC~~ SOLN
15.0000 [IU] | Freq: Every day | SUBCUTANEOUS | Status: DC
  Administered 2013-05-11: 15 [IU] via SUBCUTANEOUS
  Filled 2013-05-11 (×2): qty 0.15

## 2013-05-11 MED ORDER — INSULIN ASPART 100 UNIT/ML ~~LOC~~ SOLN
0.0000 [IU] | Freq: Every day | SUBCUTANEOUS | Status: DC
  Administered 2013-05-14: 3 [IU] via SUBCUTANEOUS

## 2013-05-11 MED ORDER — POTASSIUM CHLORIDE 10 MEQ/50ML IV SOLN
10.0000 meq | INTRAVENOUS | Status: AC
  Administered 2013-05-11 (×4): 10 meq via INTRAVENOUS
  Filled 2013-05-11 (×4): qty 50

## 2013-05-11 MED ORDER — INSULIN ASPART 100 UNIT/ML ~~LOC~~ SOLN
0.0000 [IU] | Freq: Three times a day (TID) | SUBCUTANEOUS | Status: DC
  Administered 2013-05-11: 2 [IU] via SUBCUTANEOUS
  Administered 2013-05-12: 5 [IU] via SUBCUTANEOUS
  Administered 2013-05-12: 8 [IU] via SUBCUTANEOUS
  Administered 2013-05-13: 5 [IU] via SUBCUTANEOUS
  Administered 2013-05-14: 2 [IU] via SUBCUTANEOUS
  Administered 2013-05-14: 3 [IU] via SUBCUTANEOUS
  Administered 2013-05-15: 8 [IU] via SUBCUTANEOUS
  Administered 2013-05-15 – 2013-05-18 (×6): 3 [IU] via SUBCUTANEOUS
  Administered 2013-05-18: 2 [IU] via SUBCUTANEOUS

## 2013-05-11 MED ORDER — VANCOMYCIN HCL IN DEXTROSE 1-5 GM/200ML-% IV SOLN
1000.0000 mg | INTRAVENOUS | Status: DC
  Administered 2013-05-11: 1000 mg via INTRAVENOUS
  Filled 2013-05-11 (×2): qty 200

## 2013-05-11 MED ORDER — CEFAZOLIN SODIUM 1-5 GM-% IV SOLN
1.0000 g | Freq: Three times a day (TID) | INTRAVENOUS | Status: DC
  Administered 2013-05-11 – 2013-05-15 (×13): 1 g via INTRAVENOUS
  Filled 2013-05-11 (×16): qty 50

## 2013-05-11 MED ORDER — INSULIN ASPART 100 UNIT/ML ~~LOC~~ SOLN
4.0000 [IU] | Freq: Three times a day (TID) | SUBCUTANEOUS | Status: DC
  Administered 2013-05-12 (×2): 4 [IU] via SUBCUTANEOUS

## 2013-05-11 MED ORDER — POTASSIUM CHLORIDE 10 MEQ/100ML IV SOLN: 10.0000 meq | INTRAVENOUS | Status: DC

## 2013-05-11 NOTE — Progress Notes (Signed)
2 Days Post-Op Procedure(s) (LRB): VIDEO ASSISTED THORACOSCOPY (VATS)/DECORTICATION (Right) WEDGE RESECTION RIGHT LOWER LOBE (Right) Subjective: A little more alert this AM Oriented x 2 person place Some incisional pain  Objective: Vital signs in last 24 hours: Temp:  [97.5 F (36.4 C)-98.7 F (37.1 C)] 97.5 F (36.4 C) (11/14 0705) Pulse Rate:  [110-123] 118 (11/14 0705) Cardiac Rhythm:  [-] Sinus tachycardia (11/14 0600) Resp:  [13-30] 30 (11/14 0705) BP: (92-132)/(44-101) 94/66 mmHg (11/14 0700) SpO2:  [91 %-100 %] 94 % (11/14 0705) Arterial Line BP: (83-140)/(43-75) 83/75 mmHg (11/14 0700)  Hemodynamic parameters for last 24 hours:    Intake/Output from previous day: 11/13 0701 - 11/14 0700 In: 2944.8 [I.V.:2132.3; Blood:12.5; IV Piggyback:800] Out: 2155 [Urine:1815; Chest Tube:340] Intake/Output this shift:    General appearance: less lethargic Neurologic: nonfocal Heart: tachy, regular Lungs: diminished breath sounds bibasilar Abdomen: normal findings: soft, non-tender no air leak  Lab Results:  Recent Labs  05/10/13 0355 05/11/13 0420  WBC 20.2* 17.9*  HGB 7.6* 6.9*  HCT 22.5* 19.7*  PLT 290 337   BMET:  Recent Labs  05/10/13 0355 05/11/13 0420  NA 131* 134*  K 4.1 3.3*  CL 100 102  CO2 20 20  GLUCOSE 292* 159*  BUN 25* 23  CREATININE 1.18* 1.15*  CALCIUM 7.9* 7.6*    PT/INR:  Recent Labs  05/08/13 2030  LABPROT 14.4  INR 1.14   ABG    Component Value Date/Time   PHART 7.383 05/10/2013 0353   HCO3 22.4 05/10/2013 0353   TCO2 24 05/10/2013 0353   ACIDBASEDEF 2.0 05/10/2013 0353   O2SAT 96.0 05/10/2013 0353   CBG (last 3)   Recent Labs  05/10/13 1129 05/10/13 1703 05/10/13 2152  GLUCAP 226* 201* 127*    Assessment/Plan: S/P Procedure(s) (LRB): VIDEO ASSISTED THORACOSCOPY (VATS)/DECORTICATION (Right) WEDGE RESECTION RIGHT LOWER LOBE (Right) POD # 2 decortication for empyema NEURO- she is less lethargic today but still  not back to normal  Likely toxic- metabolic encephalopathy  CV- tachycardic otherwise stable  RESP/ID- post decortication- aeration R base improving despite limited cooperation with IS/ flutter  No air leak- dc anterior tube  Keep posterior tube until < 150 ml/ day  On aztreonam and flagyl for pneumonia, empyema- cultures still pending  vanco on hold due to elevated trough level  RENAL- hypokalemia- supplement  Creatinine stable  Anemia- secondary to ABL- receiving transfusion  OOB, PT consult to mobilize  SCD + enoxaparin for DVT prophylaxis   LOS: 5 days    HENDRICKSON,STEVEN C 05/11/2013

## 2013-05-11 NOTE — Progress Notes (Signed)
11 mL of fentanyl PCA syringe wasted in sink with RN witness after PCA discontinued.

## 2013-05-11 NOTE — Progress Notes (Signed)
eLink Physician-Brief Progress Note Patient Name: Kelli Summers DOB: 02-13-1956 MRN: [id number]  Date of Service  05/11/2013   HPI/Events of Note     eICU Interventions  1 U PRBC for hb 6.9    Intervention Category Intermediate Interventions: Bleeding - evaluation and treatment with blood products  [name] V. 05/11/2013, 5:07 AM

## 2013-05-11 NOTE — Op Note (Signed)
Kelli Summers, Kelli Summers NO.:  [id number]  MEDICAL RECORD NO.:  [id number]  LOCATION:  2S16C                        FACILITY:  [facility]  PHYSICIAN:  Salvatore Decent. Dorris Fetch, M.D.DATE OF BIRTH:  [birth year]  DATE OF PROCEDURE:  05/09/2013 DATE OF DISCHARGE:                              OPERATIVE REPORT   PREOPERATIVE DIAGNOSIS:  Right parapneumonic effusion.  POSTOPERATIVE DIAGNOSIS:  Right early organizing empyema.  PROCEDURE:  Right video-assisted thoracoscopy, drainage of empyema, visceral and parietal pleural decortication.  SURGEON:  Salvatore Decent. Dorris Fetch, M.D.  ASSISTANT:  Lowella Dandy, PA.  ANESTHESIA:  General.  FINDINGS:  Early organizing empyema. Approximately 1 L of fluid.  Initial fluid was murky and then frankly purulent fluid was evacuated.  Extensive visceral and parietal pleural peel with friable tissues.  Dense peel on the lower lobe resulting in bleeding requiring stapling of the lateral edge of the lower lobe. Good re-expansion of all 3 lobes post decortication.  CLINICAL NOTE:  Kelli Summers is a 57 year old woman who recently returned from a trip to [place]. She had been having malaise, dehydration, and severe hyperglycemia.  She subsequently developed fevers and chills and right-sided pleuritic chest pain.  She was readmitted and a CT of the chest showed a large loculated right parapneumonic effusion versus empyema.  She was advised to undergo thoracoscopic drainage and possible decortication.  The indications, risks, benefits, and alternatives were discussed in detail with the patient and her family.  She understood and accepted the risks and agreed to proceed.  OPERATIVE NOTE:  Kelli Summers was brought to the preoperative holding area on May 09, 2013. There Anesthesia placed a central line and arterial blood pressure monitoring line.  Intravenous antibiotics were already being administered.  She was given an additional dose of vancomycin  which was due.  She was taken to the operating room, anesthetized, and intubated with a double-lumen endotracheal tube. A Foley catheter was placed.  Sequential compression devices were placed on the calves for DVT prophylaxis.  She was placed in a left lateral decubitus position.  The right chest was prepped and draped in the usual sterile fashion.  Single-lung ventilation of the left lung was intiated and was tolerated well throughout the procedure.  An incision was made in approximately the seventh intercostal space in the midaxillary line and was carried through the skin and subcutaneous tissue.  The chest was entered bluntly using a hemostat.  A sucker was placed in the chest and an initial 500 mL of murky fluid was evacuated. A portion of this was sent for bacterial fungal and AFB cultures.  Probing further with the tip of the sucker, an area of frankly purulent fluid was entered and this was also sent for cultures.  Approximately 1 L of fluid was evacuated in all.  After evacuating the fluid, a port was inserted and the thoracoscope was placed in the chest. Initially there were loculated fluid collections and adhesions noted.  The scope was used to take these down to allow an assessment as to where to place the primary working incision.  This incision was made in approximately the 5th intercostal space anterolaterally.  No rib spreading was performed during  the procedure. The working incision was approximately 4-5 cm in length  The adhesions were taken down bluntly and multiple loculated fluid collections were entered and all the tissue was friable and bled easily. There was a very friable thick parietal pleural peel.  There was a thick visceral pleural peel as well, although this came off very easily over the upper portion of the lower lobe as well as the portions of the upper middle lobes that were affected, however, it was a very dense and difficult to take it off on the  inferior aspect of the lower lobe.  The chest was intermittently irrigated with warm saline to help evacuate blood and clots.  The peel was removed from the upper lobe and the fissures were entered and loculated fluid collections broken up and peel removed from within the major and minor fissure.  The middle lobe was pretty well encased with its peel. This peel decorticated fairly easily.  On the lateral aspect of the lower lobe, the peel came off very easily in large pieces, however, more inferiorly the lung was severely stuck to the diaphragm and the peel was thick and inflamed in this area. There were several tears along the lower lateral edge of the lung as the peel was removed and the adhesions were taken down.  The diaphragmatic surface of the lung was freed up and there was extensive debris and clot in the posterior gutter which was evacuated.  There was bleeding from the edge of the lung where the peel had been removed on the lower edge of the lower lobe and with multiple tears in that area and it was going to be difficult to control that with sutures.  It was felt best to go ahead and to staple off the very edge of the lower lobe laterally with sequential firings of the endoscopic GIA stapler to prevent undue air leaks and bleeding.  A portion of the lung was sent for culture and the remainder was sent for pathology.  The parietal pleural peel was taken off, this bled freely. When this was done the tissue was very friable.  Both visceral and parietal pleural peel were sent for both pathology and cultures.  After taking the peel off the visceral and parietal pleura, all 3 lobes reinflated well.  ProGEL was applied to an area where there was some denuding of the visceral pleura on the lower portion of the lower lobe. The chest was copiously irrigated with 1 L of saline.  A second port type incision was made inferiorly.  A 32 Blake drain was placed through this incision, and a  36-French chest tube was placed through the original incision.  Both were secured with #1 silk sutures.  The right lung was reinflated.  There was good aeration of all 3 lobes.  The scope was withdrawn and the utility incision was closed with a #1 Vicryl fascial suture followed by a 2-0 Vicryl subcutaneous suture and a 3-0 Vicryl subcuticular suture.  She was then extubated in the operating room, taken to the postanesthetic care unit in good condition.  All sponge, needle, and instrument counts were correct at the end of the procedure.     Salvatore Decent Dorris Fetch, M.D.     SCH/MEDQ  D:  05/09/2013  T:  05/10/2013  Job:  [id number]

## 2013-05-11 NOTE — Progress Notes (Signed)
eLink Physician-Brief Progress Note Patient Name: EMILIANA BLAIZE DOB: 15-Oct-1955 MRN: [id number]  Date of Service  05/11/2013   HPI/Events of Note  Per RN, family reports smoking weed dipped in embalming fluid. This contains formaldehyde, methanol & sometimes PCP *& can cause hallucinations.    eICU Interventions  Await uDS In absence of acidosis, will not investigate further   Intervention Category Intermediate Interventions: Communication with other healthcare providers and/or family  ALVA,RAKESH V. 05/11/2013, 3:18 AM

## 2013-05-11 NOTE — Progress Notes (Signed)
PULMONARY  / CRITICAL CARE MEDICINE  Name: Kelli Summers MRN: [id number] DOB: December 22, 1955    ADMISSION DATE:  05/06/2013 CONSULTATION DATE:  05/10/2013  REFERRING MD :  Garnetta Buddy PRIMARY SERVICE:  TRH  CHIEF COMPLAINT:  Acute encephalopathy  BRIEF PATIENT DESCRIPTION: 57 yo with past medical history of chronic pain, depression and DM presenting to [facility] with malaise, fever, chest pain, dyspnea and hyperglycemia.  Chest imaging revealed loculated effusion.s transferred to [facility] under TCTS and underwent VATS / decortication.  She exhibited fluctuating levels of consciousness pre and post operatively.  PCCM was asked to evaluate.  SIGNIFICANT EVENTS / STUDIES:  11/09  Presented to [facility] with malaise, fever, dyspnea and hyperglycemia 11/10  Chest CT:  Loculated right pleural effusion with associated atelectatic/ consolidated lung. This has a complex appearance. A few small bubbles of air within the presumed pleural fluid these is suspicion for a bronchopleural fistula. There is a small left pleural effusion with minor dependent subsegmental atelectasis. 11/11  Transferred [facility] 11/12  VATS / decortication 11/13 Agitated delirium. PCCM consult 11/14 One unit PRBCs for Hgb 6.9  LINES / TUBES: L rad A-line 11/12 >> 11/14 R chest tubes 11/12 >> mgmt per TCTS R IJ CVL 11/12 >>   CULTURES: 11/10 Urine >>> neg 11/09 Blood >> Strep 11/11 Blood >>   11/12 Pleural fluid >> group B strep  ANTIBIOTICS: Cefepime  11/9 >>> 11/13 Flagyl 11/9 >> 11/14 Aztreonam 11/13 >> 11/14 Vancomycin 11/10 >>  Ceftriaxone 11/14 >>    SUBJ: Agitated at times. No resp distress. Oriented to person, place and circumstance  VITAL SIGNS: Temp:  [97.5 F (36.4 C)-99 F (37.2 C)] 98.7 F (37.1 C) (11/14 1539) Pulse Rate:  [113-123] 117 (11/14 1400) Resp:  [13-30] 19 (11/14 1400) BP: (92-125)/(44-91) 112/62 mmHg (11/14 1400) SpO2:  [93 %-100 %] 95 % (11/14 1400) Arterial Line BP: (83-140)/(43-127) 133/126 mmHg (11/14  1000)  HEMODYNAMICS:   VENTILATOR SETTINGS:   INTAKE / OUTPUT: Intake/Output     11/13 0701 - 11/14 0700 11/14 0701 - 11/15 0700   P.O.  60   I.V. (mL/kg) 2132.3 (23.6) 184.8 (2)   Blood 12.5 362.5   IV Piggyback 800 400   Total Intake(mL/kg) 2944.8 (32.6) 1007.3 (11.2)   Urine (mL/kg/hr) 1815 (0.8) 725 (0.9)   Chest Tube 340 (0.2) 30 (0)   Total Output 2155 755   Net +789.8 +252.3         PHYSICAL EXAMINATION: General: NAD Neuro:  No focal deficits HEENT: mild periorbital edema, otherwise WNL Cardiovascular:  RRR s M Lungs: clear anteriorly Abdomen:  Soft, nontender, +BS Ext; warm, no edema   LABS: I have reviewed all of today's lab results. Relevant abnormalities are discussed in the A/P section  CXR:    ASSESSMENT / PLAN:  Acute encephalopathy - improved PRN Haldol Monitor in ICU Pain mgmt - not using PCA > changed to PRN fentanyl  R empyema, s/p VATS / decortication - Group B strep (blood cx result is probably same organism, misidentified) Post op mgmt per TCTS Micro and abx as above  DM II / Hyperglycemia, well-controlled Cont ACHS CBGs/SSI  Anemia of acute blood loss No ongoing bleeding Transfuse for Hgb < 7.0     Billy Fischer, MD Pulmonary and Critical Care Medicine [facility] Pager: [phone]  05/11/2013, 4:19 PM

## 2013-05-11 NOTE — Progress Notes (Signed)
Physical Therapy Treatment Patient Details Name: SARAPHINA LAUDERBAUGH MRN: [id number] DOB: 1956/02/07 Today's Date: 05/11/2013 Time: 6213-0865 PT Time Calculation (min): 10 min  PT Assessment / Plan / Recommendation  History of Present Illness Kelli Summers is a 57 y.o. female with a past medical history of, diabetes, gastroparesis, who was recently hospitalized for hyperglycemia. She recently returned from [place] on October 29 and was experiencing fever and body aches. There was a concern about Chikungunya virus. However, she was noted to be afebrile in the hospital. She was discharged after her blood sugars were adequately controlled. She was doing well on her discharge day. Then on this past Thursday she started feeling worse again. Started having body aches, joint pains and headaches. And, then she had a temperature of 101F. She's been short of breath and has had chest pain, especially when lying on the right side. 11/12 underwent VATS for RLL resection due to empyema.   PT Comments   Pt observed through ICU window while evaluation documentation completed. She remained uncomfortable in the chair and kept scooting down towards the foot of the recliner. When nursing available to assist, pt put back to bed for her safety and comfort. RN aware of reports of Rt hip and back pain and pt's request for pain medicine. Pt did pivot back to bed much better than going to chair (likely due to incr motivation to lie down).   Follow Up Recommendations  Other (comment) (TBD)     Does the patient have the potential to tolerate intense rehabilitation     Barriers to Discharge        Equipment Recommendations  Other (comment) (TBD)    Recommendations for Other Services    Frequency Min 3X/week   Progress towards PT Goals Progress towards PT goals: Progressing toward goals  Plan Other (comment) (TBD)    Precautions / Restrictions Precautions Precautions: Fall   Pertinent Vitals/Pain Rt hip and low back  pain; unable to rate; RN to provide medication to assist with pain control     Mobility  Bed Mobility Bed Mobility: Sit to Supine;Rolling Left Rolling Left: 4: Min assist;With rail Supine to Sit: 1: +2 Total assist;HOB elevated Supine to Sit: Patient Percentage: 30% Sitting - Scoot to Edge of Bed: 2: Max assist Sit to Supine: 1: +2 Total assist;HOB flat Sit to Supine: Patient Percentage: 20% Details for Bed Mobility Assistance: continued decr cognition/processing Transfers Transfers: Sit to Stand;Stand to Sit;Stand Pivot Transfers Sit to Stand: 1: +2 Total assist Sit to Stand: Patient Percentage: 80% Stand to Sit: 1: +2 Total assist Stand to Sit: Patient Percentage: 90% Stand Pivot Transfers: 1: +2 Total assist Stand Pivot Transfers: Patient Percentage: 70% Details for Transfer Assistance: pt stood twice before she would initiate pivoting to the bed (despite stating she wanted to get back in bed) Ambulation/Gait Ambulation/Gait Assistance: Not tested (comment)    Exercises     PT Diagnosis: Difficulty walking;Generalized weakness;Acute pain  PT Problem List: Decreased strength;Decreased activity tolerance;Decreased balance;Decreased mobility;Decreased cognition;Decreased knowledge of use of DME;Decreased safety awareness;Decreased knowledge of precautions;Obesity;Pain PT Treatment Interventions: DME instruction;Gait training;Functional mobility training;Therapeutic activities;Therapeutic exercise;Cognitive remediation;Patient/family education   PT Goals (current goals can now be found in the care plan section) Acute Rehab PT Goals Patient Stated Goal: unable PT Goal Formulation: Patient unable to participate in goal setting Time For Goal Achievement: 05/18/13 Potential to Achieve Goals: Fair  Visit Information  Last PT Received On: 05/11/13 Assistance Needed: +2 History of Present Illness: Kelli P  Summers is a 57 y.o. female with a past medical history of, diabetes,  gastroparesis, who was recently hospitalized for hyperglycemia. She recently returned from [place] on October 29 and was experiencing fever and body aches. There was a concern about Chikungunya virus. However, she was noted to be afebrile in the hospital. She was discharged after her blood sugars were adequately controlled. She was doing well on her discharge day. Then on this past Thursday she started feeling worse again. Started having body aches, joint pains and headaches. And, then she had a temperature of 101F. She's been short of breath and has had chest pain, especially when lying on the right side. 11/12 underwent VATS for RLL resection due to empyema.    Subjective Data  Subjective: My back, my hip...they hurt Patient Stated Goal: unable   Cognition  Cognition Arousal/Alertness: Lethargic Behavior During Therapy: Restless Overall Cognitive Status: Impaired/Different from baseline Area of Impairment: Orientation;Attention;Following commands;Safety/judgement;Awareness;Problem solving Orientation Level: Time;Situation Current Attention Level: Focused;Sustained Following Commands: Follows one step commands inconsistently;Follows one step commands with increased time Safety/Judgement: Decreased awareness of safety;Decreased awareness of deficits Problem Solving: Slow processing;Requires verbal cues;Requires tactile cues    Balance  Balance Balance Assessed: Yes Static Sitting Balance Static Sitting - Balance Support: No upper extremity supported;Feet supported Static Sitting - Level of Assistance: 4: Min assist Static Standing Balance Static Standing - Balance Support: Bilateral upper extremity supported Static Standing - Level of Assistance: 1: +2 Total assist (pt 80%)  End of Session PT - End of Session Equipment Utilized During Treatment: Oxygen Activity Tolerance: Patient limited by pain;Treatment limited secondary to agitation Patient left: in bed;with call bell/phone within  reach;with bed alarm set;with nursing/sitter in room Nurse Communication: Mobility status;Patient requests pain meds   GP     Lincoln Kleiner 05/11/2013, 3:16 PM Pager [phone]

## 2013-05-11 NOTE — Progress Notes (Signed)
Patient ID: Kelli Summers, female   DOB: 1956/05/16, 57 y.o.   MRN: [id number]   SICU Evening Rounds:   Hemodynamically stable   Remains lethargic  Urine output good   CT output low  CBC    Component Value Date/Time   WBC 17.9* 05/11/2013 0420   RBC 2.23* 05/11/2013 0420   HGB 6.9* 05/11/2013 0420   HCT 19.7* 05/11/2013 0420   PLT 337 05/11/2013 0420   MCV 88.3 05/11/2013 0420   MCH 30.9 05/11/2013 0420   MCHC 35.0 05/11/2013 0420   RDW 14.1 05/11/2013 0420   LYMPHSABS 0.7 05/06/2013 1649   MONOABS 0.0* 05/06/2013 1649   EOSABS 0.1 05/06/2013 1649   BASOSABS 0.0 05/06/2013 1649     BMET    Component Value Date/Time   NA 134* 05/11/2013 0420   K 3.3* 05/11/2013 0420   CL 102 05/11/2013 0420   CO2 20 05/11/2013 0420   GLUCOSE 159* 05/11/2013 0420   BUN 23 05/11/2013 0420   CREATININE 1.15* 05/11/2013 0420   CALCIUM 7.6* 05/11/2013 0420   GFRNONAA 52* 05/11/2013 0420   GFRAA 60* 05/11/2013 0420     A/P:  Stable postop course. Continue current plans

## 2013-05-11 NOTE — Care Management Note (Addendum)
    Page 1 of 2   05/17/2013     4:03:14 PM   CARE MANAGEMENT NOTE 05/17/2013  Patient:  Kelli Summers, Kelli Summers   Account Number:  [id number]  Date Initiated:  05/08/2013  Documentation initiated by:  Junius Creamer  Subjective/Objective Assessment:   adm w pneumonia     Action/Plan:   lives w fam, pcp dr Assunta Found   Anticipated DC Date:  05/18/2013   Anticipated DC Plan:  HOME W HOME HEALTH SERVICES      DC Planning Services  CM consult      [facility] Choice  HOME HEALTH   Choice offered to / List presented to:  C-1 Patient        HH arranged  HH-1 RN  HH-2 PT      [facility] agency  [facility]   Status of service:  In process, will continue to follow Medicare Important Message given?   (If response is "NO", the following Medicare IM given date fields will be blank) Date Medicare IM given:   Date Additional Medicare IM given:    Discharge Disposition:  HOME W HOME HEALTH SERVICES  Per UR Regulation:  Reviewed for med. necessity/level of care/duration of stay  If discussed at Long Length of Stay Meetings, dates discussed:   05/10/2013  05/15/2013    Comments:  Contact:  Gautier,Elizabeth Daughter [phone]  05/17/13 Alydia Gosser,RN,BSN [phone] PT FOR TEE/PICC LINE PLACEMENT TODAY.  [facility] FOLLOWING FOR IV ABX AT DC.  PT STATES THAT HER AUNT OR HER DAUGHTER CAN ASSIST WITH INFUSIONS.  05/16/13 Kacey Vicuna,RN,BSN [phone] PT WILL NEED HH FOLLOW UP UPON DC; MET WITH PT TO DISCUSS THIS.  REFERRAL TO [facility], PER PT CHOICE.  START OF CARE 24-48H POST DC DATE.  PT HAS RW AT HOME, IF NEEDED.  PT MAY NEED IV ANTIBIOTICS AT DC. WILL FOLLOW PROGRESS.  Lives with Aunt.  Aunt will with her on discharge. Patient at this time still in a lot of pain. CM will continue to follow for further needs.  05-09-13 10:25am Avie Arenas, RNBSN [phone] Post op VATS.

## 2013-05-11 NOTE — Evaluation (Signed)
Physical Therapy Evaluation Patient Details Name: Kelli Summers MRN: [id number] DOB: [birth year] Today's Date: 05/11/2013 Time: 4098-1191 PT Time Calculation (min): 33 min  PT Assessment / Plan / Recommendation History of Present Illness  Kelli Summers is a 57 y.o. female with a past medical history of, diabetes, gastroparesis, who was recently hospitalized for hyperglycemia. She recently returned from [place] on October 29 and was experiencing fever and body aches. There was a concern about Chikungunya virus. However, she was noted to be afebrile in the hospital. She was discharged after her blood sugars were adequately controlled. She was doing well on her discharge day. Then on this past Thursday she started feeling worse again. Started having body aches, joint pains and headaches. And, then she had a temperature of 101F. She's been short of breath and has had chest pain, especially when lying on the right side. 11/12 underwent VATS for RLL resection due to empyema.  Clinical Impression  Patient is s/p above surgery resulting in functional limitations due to the deficits listed below (see PT Problem List).  Patient will benefit from skilled PT to increase their independence and safety with mobility to allow discharge to the venue listed below. Pt with AMS and difficult to predict d/c needs at this time. Will continue to update as appropriate.      PT Assessment  Patient needs continued PT services    Follow Up Recommendations  Other (comment) (TBD)    Does the patient have the potential to tolerate intense rehabilitation      Barriers to Discharge        Equipment Recommendations  Other (comment) (TBD)    Recommendations for Other Services     Frequency Min 3X/week    Precautions / Restrictions Precautions Precautions: Fall   Pertinent Vitals/Pain Pt reported Rt hip/lower back pain upon sitting EOB. Continued to report pain despite changes in position. RN reported no previous  complaints. Pt denied prior h/o this type pain.      Mobility  Bed Mobility Bed Mobility: Supine to Sit;Sitting - Scoot to Edge of Bed Supine to Sit: 1: +2 Total assist;HOB elevated Supine to Sit: Patient Percentage: 30% Sitting - Scoot to Edge of Bed: 2: Max assist Details for Bed Mobility Assistance: Room setup/lines required exit to Rt side of bed, therefore assisted supine to sit (instead of Rt side to sit due to Rt chest tube) Transfers Transfers: Sit to Stand;Stand to Sit;Stand Pivot Transfers Sit to Stand: 1: +2 Total assist Sit to Stand: Patient Percentage: 80% Stand to Sit: 1: +2 Total assist Stand to Sit: Patient Percentage: 90% Stand Pivot Transfers: 1: +2 Total assist Stand Pivot Transfers: Patient Percentage: 60% Details for Transfer Assistance: pt with difficulty sequencing how to weight shift and advance her legs to pivot to chair (?willful vs confusion) Ambulation/Gait Ambulation/Gait Assistance: Not tested (comment)    Exercises     PT Diagnosis: Difficulty walking;Generalized weakness;Acute pain  PT Problem List: Decreased strength;Decreased activity tolerance;Decreased balance;Decreased mobility;Decreased cognition;Decreased knowledge of use of DME;Decreased safety awareness;Decreased knowledge of precautions;Obesity;Pain PT Treatment Interventions: DME instruction;Gait training;Functional mobility training;Therapeutic activities;Therapeutic exercise;Cognitive remediation;Patient/family education     PT Goals(Current goals can be found in the care plan section) Acute Rehab PT Goals Patient Stated Goal: unable PT Goal Formulation: Patient unable to participate in goal setting Time For Goal Achievement: 05/18/13 Potential to Achieve Goals: Fair  Visit Information  Last PT Received On: 05/11/13 Assistance Needed: +2 History of Present Illness: AVELYN TOUCH is a  57 y.o. female with a past medical history of, diabetes, gastroparesis, who was recently  hospitalized for hyperglycemia. She recently returned from [place] on October 29 and was experiencing fever and body aches. There was a concern about Chikungunya virus. However, she was noted to be afebrile in the hospital. She was discharged after her blood sugars were adequately controlled. She was doing well on her discharge day. Then on this past Thursday she started feeling worse again. Started having body aches, joint pains and headaches. And, then she had a temperature of 101F. She's been short of breath and has had chest pain, especially when lying on the right side. 11/12 underwent VATS for RLL resection due to empyema.       Prior Functioning  Home Living Family/patient expects to be discharged to:: Private residence Living Arrangements: Other relatives Available Help at Discharge: Family Type of Home: Mobile home Home Access: Stairs to enter Entrance Stairs-Number of Steps: 2 Entrance Stairs-Rails: Right Home Layout: One level Home Equipment: None Additional Comments: info obtained from 11/02 admission Prior Function Level of Independence: Independent    Cognition  Cognition Arousal/Alertness: Lethargic Behavior During Therapy: Restless Overall Cognitive Status: Impaired/Different from baseline Area of Impairment: Orientation;Attention;Following commands;Safety/judgement;Awareness;Problem solving Orientation Level: Time;Situation Current Attention Level: Focused;Sustained Following Commands: Follows one step commands inconsistently;Follows one step commands with increased time Safety/Judgement: Decreased awareness of safety;Decreased awareness of deficits Problem Solving: Slow processing;Requires verbal cues;Requires tactile cues    Extremity/Trunk Assessment Upper Extremity Assessment Upper Extremity Assessment: Generalized weakness Lower Extremity Assessment Lower Extremity Assessment: Generalized weakness Cervical / Trunk Assessment Cervical / Trunk Assessment:  Normal   Balance Balance Balance Assessed: Yes Static Sitting Balance Static Sitting - Balance Support: No upper extremity supported;Feet supported Static Sitting - Level of Assistance: 4: Min assist Static Standing Balance Static Standing - Balance Support: Bilateral upper extremity supported Static Standing - Level of Assistance: 1: +2 Total assist (pt 80%)  End of Session PT - End of Session Equipment Utilized During Treatment: Gait belt;Oxygen Activity Tolerance: Patient limited by pain Patient left: in chair;with call bell/phone within reach Nurse Communication: Mobility status;Other (comment) (Reports Rt hip pain; ? safety in chair/confusion/discomfort)  GP     Harce Volden 05/11/2013, 2:57 PM Pager [phone]

## 2013-05-11 NOTE — Progress Notes (Signed)
ANTIBIOTIC CONSULT NOTE - FOLLOW UP  Pharmacy Consult for vancomycin Indication: pneumonia  Allergies  Allergen Reactions  . Ciprofloxacin Itching  . Hydrocodone-Acetaminophen Itching  . Penicillins Itching  . Percocet [Oxycodone-Acetaminophen] Itching    Patient Measurements: Height: 5\' 5"  (165.1 cm) Weight: 199 lb 1.2 oz (90.3 kg) IBW/kg (Calculated) : 57  Vital Signs: Temp: 99 F (37.2 C) (11/14 1118) Temp src: Oral (11/14 1118) BP: 99/65 mmHg (11/14 1100) Pulse Rate: 121 (11/14 1100) Intake/Output from previous day: 11/13 0701 - 11/14 0700 In: 2944.8 [I.V.:2132.3; Blood:12.5; IV Piggyback:800] Out: 2155 [Urine:1815; Chest Tube:340] Intake/Output from this shift: Total I/O In: 724 [P.O.:60; I.V.:101.5; Blood:362.5; IV Piggyback:200] Out: 435 [Urine:425; Chest Tube:10]  Labs:  Recent Labs  05/09/13 1305 05/10/13 0355 05/11/13 0420  WBC 20.3* 20.2* 17.9*  HGB 8.2* 7.6* 6.9*  PLT 222 290 337  CREATININE 1.05 1.18* 1.15*   Estimated Creatinine Clearance: 59.9 ml/min (by C-G formula based on Cr of 1.15).  Recent Labs  05/10/13 2120 05/11/13 1030  VANCOTROUGH 30.6*  --   VANCORANDOM  --  21.3     Microbiology: Recent Results (from the past 720 hour(s))  MRSA PCR SCREENING     Status: None   Collection Time    04/30/13 12:20 AM      Result Value Range Status   MRSA by PCR NEGATIVE  NEGATIVE Final   Comment:            The GeneXpert MRSA Assay (FDA     approved for NASAL specimens     only), is one component of a     comprehensive MRSA colonization     surveillance program. It is not     intended to diagnose MRSA     infection nor to guide or     monitor treatment for     MRSA infections.  CULTURE, BLOOD (ROUTINE X 2)     Status: None   Collection Time    05/06/13 11:38 PM      Result Value Range Status   Specimen Description BLOOD RIGHT HAND   Final   Special Requests BOTTLES DRAWN AEROBIC AND ANAEROBIC 5CC   Final   Culture  Setup Time      Final   Value: 05/09/2013 00:11     Performed at [facility]   Culture     Final   Value: STREPTOCOCCUS GROUP C     Note: Gram Stain Report Called to,Read Back By and Verified With: J BRODY AT [facility] 1707 05/08/13 BY S VANHOORNE Performed at [facility]     Performed at [facility]   Report Status PENDING   Incomplete  CULTURE, BLOOD (ROUTINE X 2)     Status: None   Collection Time    05/06/13 11:47 PM      Result Value Range Status   Specimen Description BLOOD RIGHT ANTECUBITAL   Final   Special Requests BOTTLES DRAWN AEROBIC AND ANAEROBIC 8CC   Final   Culture NO GROWTH 4 DAYS   Final   Report Status PENDING   Incomplete  URINE CULTURE     Status: None   Collection Time    05/07/13  2:07 AM      Result Value Range Status   Specimen Description URINE, CLEAN CATCH   Final   Special Requests NONE   Final   Culture  Setup Time     Final   Value: 05/07/2013 03:40     Performed at  [facility]     Final   Value: Multiple bacterial morphotypes present, none predominant. Suggest appropriate recollection if clinically indicated.     Performed at [facility]   Report Status 05/08/2013 FINAL   Final  CULTURE, BLOOD (ROUTINE X 2)     Status: None   Collection Time    05/08/13  6:46 PM      Result Value Range Status   Specimen Description BLOOD RIGHT ARM   Final   Special Requests BOTTLES DRAWN AEROBIC AND ANAEROBIC 10CC   Final   Culture  Setup Time     Final   Value: 05/09/2013 01:08     Performed at [facility]   Culture     Final   Value:        BLOOD CULTURE RECEIVED NO GROWTH TO DATE CULTURE WILL BE HELD FOR 5 DAYS BEFORE ISSUING A FINAL NEGATIVE REPORT     Performed at [facility]   Report Status PENDING   Incomplete  CULTURE, BLOOD (ROUTINE X 2)     Status: None   Collection Time    05/08/13  6:54 PM      Result Value Range Status   Specimen Description BLOOD RIGHT HAND   Final   Special Requests BOTTLES  DRAWN AEROBIC ONLY 4CC   Final   Culture  Setup Time     Final   Value: 05/09/2013 01:12     Performed at [facility]   Culture     Final   Value:        BLOOD CULTURE RECEIVED NO GROWTH TO DATE CULTURE WILL BE HELD FOR 5 DAYS BEFORE ISSUING A FINAL NEGATIVE REPORT     Performed at [facility]   Report Status PENDING   Incomplete  FUNGUS CULTURE W SMEAR     Status: None   Collection Time    05/09/13  9:20 AM      Result Value Range Status   Specimen Description PLEURAL FLUID RIGHT   Final   Special Requests PT ON VANCO FLAGYL MAXIPIME   Final   Fungal Smear     Final   Value: NO YEAST OR FUNGAL ELEMENTS SEEN     Performed at [facility]   Culture     Final   Value: CULTURE IN PROGRESS FOR FOUR WEEKS     Performed at [facility]   Report Status PENDING   Incomplete  AFB CULTURE WITH SMEAR     Status: None   Collection Time    05/09/13  9:20 AM      Result Value Range Status   Specimen Description PLEURAL FLUID RIGHT   Final   Special Requests PT ON VANCO FLAGYL MAXIPIME   Final   ACID FAST SMEAR     Final   Value: NO ACID FAST BACILLI SEEN     Performed at [facility]   Culture     Final   Value: CULTURE WILL BE EXAMINED FOR 6 WEEKS BEFORE ISSUING A FINAL REPORT     Performed at [facility]   Report Status PENDING   Incomplete  BODY FLUID CULTURE     Status: None   Collection Time    05/09/13  9:20 AM      Result Value Range Status   Specimen Description PLEURAL FLUID RIGHT   Final   Special Requests PT ON VANCO FLAGYL MAXIPIME   Final  Gram Stain     Final   Value: ABUNDANT WBC PRESENT,BOTH PMN AND MONONUCLEAR     ABUNDANT GRAM POSITIVE COCCI IN CLUSTERS     Gram Stain Report Called to,Read Back By and Verified With: Gram Stain Report Called to,Read Back By and Verified With:  PUJA PATEL @0747  ON 05/10/13 BY SMIAS     Performed at [facility]   Culture     Final   Value: NO GROWTH 1 DAY     Performed at  [facility]   Report Status PENDING   Incomplete  FUNGUS CULTURE W SMEAR     Status: None   Collection Time    05/09/13  9:26 AM      Result Value Range Status   Specimen Description PLEURAL FLUID RIGHT   Final   Special Requests PT ON VANCO FLAGYL MAXIPIME   Final   Fungal Smear     Final   Value: NO YEAST OR FUNGAL ELEMENTS SEEN     Performed at [facility]   Culture     Final   Value: CULTURE IN PROGRESS FOR FOUR WEEKS     Performed at [facility]   Report Status PENDING   Incomplete  AFB CULTURE WITH SMEAR     Status: None   Collection Time    05/09/13  9:26 AM      Result Value Range Status   Specimen Description PLEURAL FLUID RIGHT   Final   Special Requests PT ON VANCO FLAGYL MAXIPIME   Final   ACID FAST SMEAR     Final   Value: NO ACID FAST BACILLI SEEN     Performed at [facility]   Culture     Final   Value: CULTURE WILL BE EXAMINED FOR 6 WEEKS BEFORE ISSUING A FINAL REPORT     Performed at [facility]   Report Status PENDING   Incomplete  BODY FLUID CULTURE     Status: None   Collection Time    05/09/13  9:26 AM      Result Value Range Status   Specimen Description PLEURAL FLUID RIGHT   Final   Special Requests PT ON VANCO FLAGYL MAXIPIME   Final   Gram Stain     Final   Value: RARE WBC PRESENT, PREDOMINANTLY PMN     NO ORGANISMS SEEN     Performed at [facility]   Culture     Final   Value: NO GROWTH 1 DAY     Performed at [facility]   Report Status PENDING   Incomplete  AFB CULTURE WITH SMEAR     Status: None   Collection Time    05/09/13  9:39 AM      Result Value Range Status   Specimen Description TISSUE PLEURAL   Final   Special Requests RIGHT PLEURAL PEEL PT ON VANCO FLAGYL MAXIPIME   Final   ACID FAST SMEAR     Final   Value: NO ACID FAST BACILLI SEEN     Performed at [facility]   Culture     Final   Value: CULTURE WILL BE EXAMINED FOR 6 WEEKS BEFORE ISSUING A FINAL REPORT      Performed at [facility]   Report Status PENDING   Incomplete  FUNGUS CULTURE W SMEAR     Status: None   Collection Time    05/09/13  9:39 AM  Result Value Range Status   Specimen Description TISSUE PLEURAL   Final   Special Requests RIGHT PLEURAL PEEL PT ON VANCO FLAGYL MAXIPIME   Final   Fungal Smear     Final   Value: NO YEAST OR FUNGAL ELEMENTS SEEN     Performed at [facility]   Culture     Final   Value: CULTURE IN PROGRESS FOR FOUR WEEKS     Performed at [facility]   Report Status PENDING   Incomplete  TISSUE CULTURE     Status: None   Collection Time    05/09/13  9:39 AM      Result Value Range Status   Specimen Description TISSUE PLEURAL   Final   Special Requests RIGHT PLEURAL PEEL PT ON VANCO FLAGYL MAXIPIME   Final   Gram Stain     Final   Value: RARE WBC PRESENT,BOTH PMN AND MONONUCLEAR     NO ORGANISMS SEEN     Performed at [facility]     Final   Value: Culture reincubated for better growth     Performed at [facility]   Report Status PENDING   Incomplete  ANAEROBIC CULTURE     Status: None   Collection Time    05/09/13  9:39 AM      Result Value Range Status   Specimen Description TISSUE PLEURAL   Final   Special Requests RIGHT PLEURAL PEEL PT ON VANCO FLAYGYL MAXIPIME   Final   Gram Stain PENDING   Incomplete   Culture     Final   Value: NO ANAEROBES ISOLATED; CULTURE IN PROGRESS FOR 5 DAYS     Performed at [facility]   Report Status PENDING   Incomplete  AFB CULTURE WITH SMEAR     Status: None   Collection Time    05/09/13  9:52 AM      Result Value Range Status   Specimen Description TISSUE PLEURAL   Final   Special Requests PARIETAL PLEURAL PEEL PT ON VANCO FLAGYL MAXIPIME   Final   ACID FAST SMEAR     Final   Value: NO ACID FAST BACILLI SEEN     Performed at [facility]   Culture     Final   Value: CULTURE WILL BE EXAMINED FOR 6 WEEKS BEFORE ISSUING A FINAL REPORT      Performed at [facility]   Report Status PENDING   Incomplete  FUNGUS CULTURE W SMEAR     Status: None   Collection Time    05/09/13  9:52 AM      Result Value Range Status   Specimen Description TISSUE PLEURAL   Final   Special Requests PARIETAL PLEURAL PEEL PT ON VANCO FLAGYL MAXIPIME   Final   Fungal Smear     Final   Value: NO YEAST OR FUNGAL ELEMENTS SEEN     Performed at [facility]   Culture     Final   Value: CULTURE IN PROGRESS FOR FOUR WEEKS     Performed at [facility]   Report Status PENDING   Incomplete  ANAEROBIC CULTURE     Status: None   Collection Time    05/09/13  9:52 AM      Result Value Range Status   Specimen Description TISSUE PLEURAL   Final   Special Requests PARIETAL PLEURAL PEEL PT ON VANCO FLAGYL MAXIPIME   Final  Gram Stain     Final   Value: RARE WBC PRESENT,BOTH PMN AND MONONUCLEAR     NO ORGANISMS SEEN     Performed at [facility]   Culture     Final   Value: NO ANAEROBES ISOLATED; CULTURE IN PROGRESS FOR 5 DAYS     Performed at [facility]   Report Status PENDING   Incomplete  TISSUE CULTURE     Status: None   Collection Time    05/09/13  9:52 AM      Result Value Range Status   Specimen Description TISSUE PLEURAL   Final   Special Requests PARIETAL PLEURAL PEEL PT ON VANCO FLAGYL MAXIPIME   Final   Gram Stain     Final   Value: RARE WBC PRESENT,BOTH PMN AND MONONUCLEAR     NO ORGANISMS SEEN     Performed at [facility]     Final   Value: Culture reincubated for better growth     Performed at [facility]   Report Status PENDING   Incomplete  AFB CULTURE WITH SMEAR     Status: None   Collection Time    05/09/13 11:06 AM      Result Value Range Status   Specimen Description TISSUE LUNG RIGHT   Final   Special Requests RIGHT LOWER LOBE PT ON VANCOMYCIN,FLAGYL,MAXIPIME   Final   ACID FAST SMEAR     Final   Value: NO ACID FAST BACILLI SEEN     Performed at  [facility]   Culture     Final   Value: CULTURE WILL BE EXAMINED FOR 6 WEEKS BEFORE ISSUING A FINAL REPORT     Performed at [facility]   Report Status PENDING   Incomplete  FUNGUS CULTURE W SMEAR     Status: None   Collection Time    05/09/13 11:06 AM      Result Value Range Status   Specimen Description TISSUE LUNG RIGHT   Final   Special Requests RIGHT LOWER LOBE PT ON VANCOMYCIN,FLAGYL,MAXIPIME   Final   Fungal Smear     Final   Value: NO YEAST OR FUNGAL ELEMENTS SEEN     Performed at [facility]   Culture     Final   Value: CULTURE IN PROGRESS FOR FOUR WEEKS     Performed at [facility]   Report Status PENDING   Incomplete  ANAEROBIC CULTURE     Status: None   Collection Time    05/09/13 11:06 AM      Result Value Range Status   Specimen Description TISSUE LUNG RIGHT   Final   Special Requests RIGHT LOWER LOBE PT ON VANCOMYCIN,FLAGYL,MAXIPIME   Final   Gram Stain     Final   Value: RARE WBC PRESENT,BOTH PMN AND MONONUCLEAR     NO ORGANISMS SEEN     Performed at [facility]   Culture     Final   Value: NO ANAEROBES ISOLATED; CULTURE IN PROGRESS FOR 5 DAYS     Performed at [facility]   Report Status PENDING   Incomplete  TISSUE CULTURE     Status: None   Collection Time    05/09/13 11:06 AM      Result Value Range Status   Specimen Description TISSUE LUNG RIGHT   Final   Special Requests RIGHT LOWER LOBE PT ON VANCOMYCIN,FLAGYL,MAXIPIME   Final   Gram  Stain     Final   Value: RARE WBC PRESENT,BOTH PMN AND MONONUCLEAR     NO ORGANISMS SEEN     Performed at [facility]   Culture     Final   Value: Culture reincubated for better growth     Performed at [facility]   Report Status PENDING   Incomplete    Anti-infectives   Start     Dose/Rate Route Frequency Ordered Stop   05/11/13 1400  ceFAZolin (ANCEF) IVPB 1 g/50 mL premix     1 g 100 mL/hr over 30 Minutes Intravenous 3 times per day  05/11/13 1018     05/10/13 1545  aztreonam (AZACTAM) 1 g in dextrose 5 % 50 mL IVPB  Status:  Discontinued     1 g 100 mL/hr over 30 Minutes Intravenous 3 times per day 05/10/13 1542 05/11/13 1018   05/07/13 1000  vancomycin (VANCOCIN) IVPB 1000 mg/200 mL premix  Status:  Discontinued     1,000 mg 200 mL/hr over 60 Minutes Intravenous Every 12 hours 05/07/13 0739 05/10/13 2216   05/06/13 2300  metroNIDAZOLE (FLAGYL) IVPB 500 mg  Status:  Discontinued     500 mg 100 mL/hr over 60 Minutes Intravenous Every 8 hours 05/06/13 2259 05/11/13 1018   05/06/13 2300  ceFEPIme (MAXIPIME) 1 g in dextrose 5 % 50 mL IVPB  Status:  Discontinued     1 g 100 mL/hr over 30 Minutes Intravenous 3 times per day 05/06/13 2259 05/10/13 1538   05/06/13 2045  vancomycin (VANCOCIN) IVPB 1000 mg/200 mL premix     1,000 mg 200 mL/hr over 60 Minutes Intravenous  Once 05/06/13 2033 05/07/13 0054   05/06/13 2045  ceFEPIme (MAXIPIME) 1 g in dextrose 5 % 50 mL IVPB     1 g 100 mL/hr over 30 Minutes Intravenous  Once 05/06/13 2033 05/06/13 2158   05/06/13 2045  metroNIDAZOLE (FLAGYL) IVPB 500 mg  Status:  Discontinued     500 mg 100 mL/hr over 60 Minutes Intravenous  Once 05/06/13 2033 05/07/13 0738   05/06/13 2000  levofloxacin (LEVAQUIN) IVPB 750 mg  Status:  Discontinued     750 mg 100 mL/hr over 90 Minutes Intravenous Every 24 hours 05/06/13 1955 05/06/13 2010      Assessment: Patient is a 57 y.o F with empyema s/p VATS on 11/12.    Cefepime 11/9>> 11/13 Flagyl 11/9>>11/14 Vanc 11/9 >> (plan 8d, see consult order) Ancef 11/14>>  11/9 nld x2 >> 1/2 GPC in clusters strep group C 11/11 bld x 2 >> 11/10 ucx>> neg FINAL 11/12 pleural fluid >> ngtd 11/12 pleural tissue >>ngtd  Scr remains stable at 1.15 today.  Vancomycin trough level obtained yesterday was supratherapeutic at 30.6 while on the 1gm q12h regimen. Vancomycin has been on hold since yesterday. Level drawn today now back as 21.3 (~24 hrs post 1gm  dose).  Goal of Therapy:  Vancomycin trough level 15-20 mcg/ml  Plan:  1) change vancomycin dose to 1gm IV q24h  Brandom Kerwin P 05/11/2013,12:38 PM

## 2013-05-11 NOTE — Progress Notes (Signed)
Pt unable to perform flutter. Encouraged pt to cough. Pt has weak cough with minimal effort.

## 2013-05-12 ENCOUNTER — Inpatient Hospital Stay (HOSPITAL_COMMUNITY): Payer: 59

## 2013-05-12 LAB — CULTURE, BLOOD (ROUTINE X 2): Culture: NO GROWTH

## 2013-05-12 LAB — COMPREHENSIVE METABOLIC PANEL
ALT: 8 U/L (ref 0–35)
AST: 14 U/L (ref 0–37)
Albumin: 1.5 g/dL — ABNORMAL LOW (ref 3.5–5.2)
Alkaline Phosphatase: 138 U/L — ABNORMAL HIGH (ref 39–117)
BUN: 19 mg/dL (ref 6–23)
CO2: 21 mEq/L (ref 19–32)
Calcium: 7.6 mg/dL — ABNORMAL LOW (ref 8.4–10.5)
Chloride: 99 mEq/L (ref 96–112)
Creatinine, Ser: 1.17 mg/dL — ABNORMAL HIGH (ref 0.50–1.10)
GFR calc Af Amer: 59 mL/min — ABNORMAL LOW (ref >90)
GFR calc non Af Amer: 51 mL/min — ABNORMAL LOW (ref >90)
Glucose, Bld: 95 mg/dL (ref 70–99)
Potassium: 3.1 mEq/L — ABNORMAL LOW (ref 3.5–5.1)
Sodium: 131 mEq/L — ABNORMAL LOW (ref 135–145)
Total Bilirubin: 0.4 mg/dL (ref 0.3–1.2)
Total Protein: 5.3 g/dL — ABNORMAL LOW (ref 6.0–8.3)

## 2013-05-12 LAB — TYPE AND SCREEN
ABO/RH(D): O POS
Antibody Screen: NEGATIVE
Unit division: 0

## 2013-05-12 LAB — CBC
HCT: 22.6 % — ABNORMAL LOW (ref 36.0–46.0)
Hemoglobin: 7.9 g/dL — ABNORMAL LOW (ref 12.0–15.0)
MCH: 31 pg (ref 26.0–34.0)
MCHC: 35 g/dL (ref 30.0–36.0)
MCV: 88.6 fL (ref 78.0–100.0)
Platelets: 429 10*3/uL — ABNORMAL HIGH (ref 150–400)
RBC: 2.55 MIL/uL — ABNORMAL LOW (ref 3.87–5.11)
RDW: 13.9 % (ref 11.5–15.5)
WBC: 16.8 10*3/uL — ABNORMAL HIGH (ref 4.0–10.5)

## 2013-05-12 LAB — GLUCOSE, CAPILLARY
Glucose-Capillary: 86 mg/dL (ref 70–99)
Glucose-Capillary: 105 mg/dL — ABNORMAL HIGH (ref 70–99)
Glucose-Capillary: 220 mg/dL — ABNORMAL HIGH (ref 70–99)
Glucose-Capillary: 251 mg/dL — ABNORMAL HIGH (ref 70–99)

## 2013-05-12 MED ORDER — POTASSIUM CHLORIDE CRYS ER 20 MEQ PO TBCR
30.0000 meq | EXTENDED_RELEASE_TABLET | ORAL | Status: AC
  Administered 2013-05-12 (×2): 30 meq via ORAL
  Filled 2013-05-12 (×2): qty 1

## 2013-05-12 MED ORDER — INSULIN DETEMIR 100 UNIT/ML ~~LOC~~ SOLN
30.0000 [IU] | Freq: Every day | SUBCUTANEOUS | Status: DC
  Administered 2013-05-12 – 2013-05-14 (×3): 30 [IU] via SUBCUTANEOUS
  Filled 2013-05-12 (×4): qty 0.3

## 2013-05-12 MED ORDER — INSULIN ASPART 100 UNIT/ML ~~LOC~~ SOLN
8.0000 [IU] | Freq: Three times a day (TID) | SUBCUTANEOUS | Status: DC
  Administered 2013-05-13 – 2013-05-16 (×10): 8 [IU] via SUBCUTANEOUS

## 2013-05-12 MED ORDER — POTASSIUM CHLORIDE CRYS ER 10 MEQ PO TBCR
EXTENDED_RELEASE_TABLET | ORAL | Status: AC
  Filled 2013-05-12: qty 3

## 2013-05-12 NOTE — Progress Notes (Signed)
3 Days Post-Op Procedure(s) (LRB): VIDEO ASSISTED THORACOSCOPY (VATS)/DECORTICATION (Right) WEDGE RESECTION RIGHT LOWER LOBE (Right) Subjective:  No complaints  Objective: Vital signs in last 24 hours: Temp:  [98.7 F (37.1 C)-99.1 F (37.3 C)] 98.7 F (37.1 C) (11/15 0713) Pulse Rate:  [113-131] 121 (11/15 1200) Cardiac Rhythm:  [-] Sinus tachycardia (11/15 1200) Resp:  [9-26] 23 (11/15 1200) BP: (93-149)/(54-83) 144/73 mmHg (11/15 1200) SpO2:  [88 %-100 %] 98 % (11/15 1200)  Hemodynamic parameters for last 24 hours:    Intake/Output from previous day: 11/14 0701 - 11/15 0700 In: 1397.3 [P.O.:60; I.V.:474.8; Blood:362.5; IV Piggyback:500] Out: 2630 [Urine:2580; Chest Tube:50] Intake/Output this shift: Total I/O In: 110 [I.V.:60; IV Piggyback:50] Out: 0   General appearance: cooperative and slowed mentation Neurologic: intact Heart: regular rate and rhythm, S1, S2 normal, no murmur, click, rub or gallop Lungs: decreased over RLL Chest tube output serous, low volume. Lab Results:  Recent Labs  05/11/13 0420 05/12/13 0413  WBC 17.9* 16.8*  HGB 6.9* 7.9*  HCT 19.7* 22.6*  PLT 337 429*   BMET:  Recent Labs  05/11/13 0420 05/12/13 0413  NA 134* 131*  K 3.3* 3.1*  CL 102 99  CO2 20 21  GLUCOSE 159* 95  BUN 23 19  CREATININE 1.15* 1.17*  CALCIUM 7.6* 7.6*    PT/INR: No results found for this basename: LABPROT, INR,  in the last 72 hours ABG    Component Value Date/Time   PHART 7.383 05/10/2013 0353   HCO3 22.4 05/10/2013 0353   TCO2 24 05/10/2013 0353   ACIDBASEDEF 2.0 05/10/2013 0353   O2SAT 96.0 05/10/2013 0353   CBG (last 3)   Recent Labs  05/11/13 2117 05/12/13 0711 05/12/13 1212  GLUCAP 86 105* 220*   CXR stable. No significant effusion, pleural thickening on the right  Assessment/Plan: S/P Procedure(s) (LRB): VIDEO ASSISTED THORACOSCOPY (VATS)/DECORTICATION (Right) WEDGE RESECTION RIGHT LOWER LOBE (Right)  Group B strep  empyema  Remove remaining chest tube.  Mobilize  Continue IV antibiotic   LOS: 6 days    Tarren Sabree K 05/12/2013

## 2013-05-12 NOTE — Progress Notes (Signed)
Patient ID: Kelli Summers, female   DOB: 06-24-1956, 56 y.o.   MRN: [id number]   SICU Evening Rounds:   Hemodynamically stable   More alert and eating today. Glucose 251 this evening. Her Hgb A1C was 15 preop. Will increase Levemir and meal coverage. Urine output good    CBC    Component Value Date/Time   WBC 16.8* 05/12/2013 0413   RBC 2.55* 05/12/2013 0413   HGB 7.9* 05/12/2013 0413   HCT 22.6* 05/12/2013 0413   PLT 429* 05/12/2013 0413   MCV 88.6 05/12/2013 0413   MCH 31.0 05/12/2013 0413   MCHC 35.0 05/12/2013 0413   RDW 13.9 05/12/2013 0413   LYMPHSABS 0.7 05/06/2013 1649   MONOABS 0.0* 05/06/2013 1649   EOSABS 0.1 05/06/2013 1649   BASOSABS 0.0 05/06/2013 1649     BMET    Component Value Date/Time   NA 131* 05/12/2013 0413   K 3.1* 05/12/2013 0413   CL 99 05/12/2013 0413   CO2 21 05/12/2013 0413   GLUCOSE 95 05/12/2013 0413   BUN 19 05/12/2013 0413   CREATININE 1.17* 05/12/2013 0413   CALCIUM 7.6* 05/12/2013 0413   GFRNONAA 51* 05/12/2013 0413   GFRAA 59* 05/12/2013 0413     A/P:  Stable postop course. Continue current plans

## 2013-05-12 NOTE — Progress Notes (Signed)
[facility] ADULT ICU REPLACEMENT PROTOCOL FOR AM LAB REPLACEMENT ONLY  The patient does apply for the [facility] Adult ICU Electrolyte Replacment Protocol based on the criteria listed below:   1. Is GFR >/= 40 ml/min? yes  Patient's GFR today is 51 2. Is urine output >/= 0.5 ml/kg/hr for the last 6 hours? yes Patient's UOP is 1.0 ml/kg/hr 3. Is BUN < 60 mg/dL? yes  Patient's BUN today is 19 4. Abnormal electrolyte(s): Potassium 5. Ordered repletion with: Potassium per protocol 6. If a panic level lab has been reported, has the CCM MD in charge been notified? yes.   Physician:  Melina Schools, Carney Bern P 05/12/2013 5:39 AM

## 2013-05-12 NOTE — Progress Notes (Signed)
PULMONARY  / CRITICAL CARE MEDICINE  Name: Kelli Summers MRN: [id number] DOB: May 27, 1956    ADMISSION DATE:  05/06/2013 CONSULTATION DATE:  05/10/2013  REFERRING MD :  Garnetta Buddy PRIMARY SERVICE:  TRH  CHIEF COMPLAINT:  Acute encephalopathy  BRIEF PATIENT DESCRIPTION: 57 yo with past medical history of chronic pain, depression and DM presenting to [facility] ED with malaise, fever, chest pain, dyspnea and hyperglycemia.  Chest imaging revealed loculated effusion.s transferred to [facility] under TCTS and underwent VATS / decortication.  She exhibited fluctuating levels of consciousness pre and post operatively.  PCCM was asked to evaluate.  SIGNIFICANT EVENTS / STUDIES:  11/09  Presented to [facility] with malaise, fever, dyspnea and hyperglycemia 11/10  Chest CT:  Loculated right pleural effusion with associated atelectatic/ consolidated lung. This has a complex appearance. A few small bubbles of air within the presumed pleural fluid these is suspicion for a bronchopleural fistula. There is a small left pleural effusion with minor dependent subsegmental atelectasis. 11/11  Transferred to [facility] 11/12  VATS / decortication 11/13 Agitated delirium. PCCM consult 11/14 One unit PRBCs for Hgb 6.9 11/14 VATS path neg for malignancy   LINES / TUBES: L rad A-line 11/12 >> 11/14 R chest tubes 11/12 >> mgmt per TCTS R IJ CVL 11/12 >>   CULTURES: 11/10 Urine >>> neg 11/09 Blood >> Strep 11/11 Blood >>   11/12 Pleural fluid >> group B strep  ANTIBIOTICS: Cefepime  11/9 >>> 11/13 Flagyl 11/9 >> 11/14 Aztreonam 11/13 >> 11/14 Vancomycin 11/10 >>  Ceftriaxone 11/14 >>    SUBJ: Minimal Chest tube drainage (<50cc x 24hr )  , no leak on water seal Improved appetite  Remains tachy C/o rt chest pain    VITAL SIGNS: Temp:  [98.7 F (37.1 C)-99.1 F (37.3 C)] 98.7 F (37.1 C) (11/15 0713) Pulse Rate:  [113-131] 122 (11/15 0630) Resp:  [16-26] 23 (11/15 0630) BP: (93-149)/(50-73) 132/71 mmHg (11/15 0600) SpO2:   [92 %-99 %] 97 % (11/15 0739) Arterial Line BP: (133)/(126) 133/126 mmHg (11/14 1000)  HEMODYNAMICS:   VENTILATOR SETTINGS:   INTAKE / OUTPUT: Intake/Output     11/14 0701 - 11/15 0700 11/15 0701 - 11/16 0700   P.O. 60    I.V. (mL/kg) 474.8 (5.3)    Blood 362.5    IV Piggyback 500    Total Intake(mL/kg) 1397.3 (15.5)    Urine (mL/kg/hr) 2580 (1.2)    Chest Tube 50 (0)    Total Output 2630     Net -1232.7           PHYSICAL EXAMINATION: General: NAD Neuro:  No focal deficits HEENT: mild periorbital edema, otherwise WNL Cardiovascular:  RRR /ST , no m/r/g  Lungs: Clear , decreased BS in bases , CT in place on Right , no leak on water seal  Abdomen:  Soft, nontender, +BS Ext; warm, no edema   LABS:  Recent Labs Lab 05/10/13 0355 05/11/13 0420 05/12/13 0413  HGB 7.6* 6.9* 7.9*  HCT 22.5* 19.7* 22.6*  WBC 20.2* 17.9* 16.8*  PLT 290 337 429*    Recent Labs Lab 05/06/13 1649  05/08/13 2030 05/09/13 1305 05/10/13 0355 05/11/13 0420 05/12/13 0413  NA 131*  < > 132* 130* 131* 134* 131*  K 2.3*  < > 3.5 4.2 4.1 3.3* 3.1*  CL 99  < > 99 99 100 102 99  CO2 20  < > 20 20 20 20 21   GLUCOSE 322*  < > 209* 335* 292* 159*  95  BUN 16  < > 20 22 25* 23 19  CREATININE 0.91  < > 1.00 1.05 1.18* 1.15* 1.17*  CALCIUM 8.7  < > 8.3* 8.1* 7.9* 7.6* 7.6*  MG 1.4*  --   --   --   --   --   --   < > = values in this interval not displayed.  CXR:  Stable pulmonary vascular congestion and left pleural effusion.  . Stable pleural thickening and scarring on the right.    ASSESSMENT / PLAN:  Acute encephalopathy - improved PRN Haldol Monitor in ICU Pain mgmt - not using PCA > changed to PRN fentanyl Will minimize narcs /sedating   R empyema, s/p VATS / decortication - Group B strep (blood cx result is probably same organism, misidentified) Post op mgmt per TCTS Ct ancef for strep - augmentin on discharge, dc vanc  DM II / Hyperglycemia, well-controlled Cont ACHS  CBGs/SSI  Anemia of acute blood loss No ongoing bleeding Transfuse for Hgb < 7.0   Hypokalemia  Replaced K+  Best Practice  Lovenox /DVT proph. Mobilize pt   PARRETT,TAMMY,    Care during the described time interval was provided by me and/or other providers on the critical care team.  I have reviewed this patient's available data, including medical history, events of note, physical examination and test results as part of my evaluation  Cyril Mourning MD. FCCP. [place] [facility] Pager [phone] If no response call [phone]   05/12/2013, 9:50 AM

## 2013-05-13 ENCOUNTER — Inpatient Hospital Stay (HOSPITAL_COMMUNITY): Payer: 59

## 2013-05-13 LAB — CBC
HCT: 21 % — ABNORMAL LOW (ref 36.0–46.0)
HCT: 22.8 % — ABNORMAL LOW (ref 36.0–46.0)
Hemoglobin: 7.1 g/dL — ABNORMAL LOW (ref 12.0–15.0)
Hemoglobin: 7.7 g/dL — ABNORMAL LOW (ref 12.0–15.0)
MCH: 30.3 pg (ref 26.0–34.0)
MCH: 30.3 pg (ref 26.0–34.0)
MCHC: 33.8 g/dL (ref 30.0–36.0)
MCHC: 33.8 g/dL (ref 30.0–36.0)
MCV: 89.7 fL (ref 78.0–100.0)
MCV: 89.8 fL (ref 78.0–100.0)
Platelets: 472 10*3/uL — ABNORMAL HIGH (ref 150–400)
Platelets: 590 10*3/uL — ABNORMAL HIGH (ref 150–400)
RBC: 2.34 MIL/uL — ABNORMAL LOW (ref 3.87–5.11)
RBC: 2.54 MIL/uL — ABNORMAL LOW (ref 3.87–5.11)
RDW: 13.9 % (ref 11.5–15.5)
RDW: 14 % (ref 11.5–15.5)
WBC: 16.9 10*3/uL — ABNORMAL HIGH (ref 4.0–10.5)
WBC: 18.5 10*3/uL — ABNORMAL HIGH (ref 4.0–10.5)

## 2013-05-13 LAB — BASIC METABOLIC PANEL
BUN: 17 mg/dL (ref 6–23)
BUN: 16 mg/dL (ref 6–23)
CO2: 23 mEq/L (ref 19–32)
CO2: 24 mEq/L (ref 19–32)
Calcium: 7.3 mg/dL — ABNORMAL LOW (ref 8.4–10.5)
Calcium: 7.4 mg/dL — ABNORMAL LOW (ref 8.4–10.5)
Chloride: 98 mEq/L (ref 96–112)
Chloride: 95 mEq/L — ABNORMAL LOW (ref 96–112)
Creatinine, Ser: 1.12 mg/dL — ABNORMAL HIGH (ref 0.50–1.10)
Creatinine, Ser: 1.16 mg/dL — ABNORMAL HIGH (ref 0.50–1.10)
GFR calc Af Amer: 62 mL/min — ABNORMAL LOW (ref >90)
GFR calc Af Amer: 59 mL/min — ABNORMAL LOW (ref >90)
GFR calc non Af Amer: 53 mL/min — ABNORMAL LOW (ref >90)
GFR calc non Af Amer: 51 mL/min — ABNORMAL LOW (ref >90)
Glucose, Bld: 150 mg/dL — ABNORMAL HIGH (ref 70–99)
Glucose, Bld: 239 mg/dL — ABNORMAL HIGH (ref 70–99)
Potassium: 3 mEq/L — ABNORMAL LOW (ref 3.5–5.1)
Potassium: 3.7 mEq/L (ref 3.5–5.1)
Sodium: 132 mEq/L — ABNORMAL LOW (ref 135–145)
Sodium: 129 mEq/L — ABNORMAL LOW (ref 135–145)

## 2013-05-13 LAB — TISSUE CULTURE

## 2013-05-13 LAB — GLUCOSE, CAPILLARY
Glucose-Capillary: 160 mg/dL — ABNORMAL HIGH (ref 70–99)
Glucose-Capillary: 96 mg/dL (ref 70–99)
Glucose-Capillary: 113 mg/dL — ABNORMAL HIGH (ref 70–99)
Glucose-Capillary: 211 mg/dL — ABNORMAL HIGH (ref 70–99)
Glucose-Capillary: 159 mg/dL — ABNORMAL HIGH (ref 70–99)

## 2013-05-13 LAB — PRO B NATRIURETIC PEPTIDE: Pro B Natriuretic peptide (BNP): 2295 pg/mL — ABNORMAL HIGH (ref 0–125)

## 2013-05-13 LAB — TSH: TSH: 3.129 u[IU]/mL (ref 0.350–4.500)

## 2013-05-13 LAB — PHOSPHORUS: Phosphorus: 2.6 mg/dL (ref 2.3–4.6)

## 2013-05-13 LAB — OSMOLALITY, URINE: Osmolality, Ur: 398 mOsm/kg (ref 390–1090)

## 2013-05-13 LAB — OSMOLALITY: Osmolality: 280 mOsm/kg (ref 275–300)

## 2013-05-13 LAB — CORTISOL-AM, BLOOD: Cortisol - AM: 10.2 ug/dL (ref 4.3–22.4)

## 2013-05-13 LAB — LACTIC ACID, PLASMA: Lactic Acid, Venous: 1 mmol/L (ref 0.5–2.2)

## 2013-05-13 LAB — URIC ACID: Uric Acid, Serum: 3.6 mg/dL (ref 2.4–7.0)

## 2013-05-13 LAB — MAGNESIUM
Magnesium: 1.5 mg/dL (ref 1.5–2.5)
Magnesium: 2 mg/dL (ref 1.5–2.5)

## 2013-05-13 LAB — TROPONIN I: Troponin I: <0.3 ng/mL (ref <0.30)

## 2013-05-13 LAB — SODIUM, URINE, RANDOM: Sodium, Ur: 79 mEq/L

## 2013-05-13 LAB — CREATININE, URINE, RANDOM: Creatinine, Urine: 47.98 mg/dL

## 2013-05-13 MED ORDER — POTASSIUM CHLORIDE CRYS ER 20 MEQ PO TBCR
40.0000 meq | EXTENDED_RELEASE_TABLET | Freq: Every day | ORAL | Status: DC
  Administered 2013-05-14 – 2013-05-18 (×5): 40 meq via ORAL
  Filled 2013-05-13 (×6): qty 2

## 2013-05-13 MED ORDER — MAGNESIUM SULFATE 40 MG/ML IJ SOLN
2.0000 g | Freq: Once | INTRAMUSCULAR | Status: AC
  Administered 2013-05-13: 2 g via INTRAVENOUS

## 2013-05-13 MED ORDER — MAGNESIUM SULFATE 40 MG/ML IJ SOLN
INTRAMUSCULAR | Status: AC
  Filled 2013-05-13: qty 50

## 2013-05-13 MED ORDER — POTASSIUM CHLORIDE CRYS ER 10 MEQ PO TBCR
EXTENDED_RELEASE_TABLET | ORAL | Status: AC
  Filled 2013-05-13: qty 3

## 2013-05-13 MED ORDER — POTASSIUM CHLORIDE CRYS ER 20 MEQ PO TBCR: 40.0000 meq | EXTENDED_RELEASE_TABLET | Freq: Once | ORAL | Status: DC

## 2013-05-13 MED ORDER — POTASSIUM CHLORIDE CRYS ER 20 MEQ PO TBCR
30.0000 meq | EXTENDED_RELEASE_TABLET | ORAL | Status: AC
  Administered 2013-05-13 (×2): 30 meq via ORAL

## 2013-05-13 MED ORDER — POTASSIUM CHLORIDE CRYS ER 10 MEQ PO TBCR
EXTENDED_RELEASE_TABLET | ORAL | Status: AC
  Administered 2013-05-13: 40 meq
  Filled 2013-05-13: qty 4

## 2013-05-13 NOTE — Progress Notes (Signed)
Andrey Campanile, NP returned page.  2gm magnesium order.  Pt is to transfer post transfusion unless symptoms are unrelieved.

## 2013-05-13 NOTE — Progress Notes (Signed)
PULMONARY  / CRITICAL CARE MEDICINE  Name: Kelli Summers MRN: [id number] DOB: 1955-11-02    ADMISSION DATE:  05/06/2013 CONSULTATION DATE:  05/10/2013  REFERRING MD :  Garnetta Buddy PRIMARY SERVICE:  TRH  CHIEF COMPLAINT:  Acute encephalopathy  BRIEF PATIENT DESCRIPTION: 57 yo with past medical history of chronic pain, depression and DM presenting to [facility] ED with malaise, fever, chest pain, dyspnea and hyperglycemia.  Chest imaging revealed loculated effusion.s transferred to [facility] under TCTS and underwent VATS / decortication.  She exhibited fluctuating levels of consciousness pre and post operatively.  PCCM was asked to evaluate.  SIGNIFICANT EVENTS / STUDIES:  11/09  Presented to [facility] with malaise, fever, dyspnea and hyperglycemia 11/10  Chest CT:  Loculated right pleural effusion with associated atelectatic/ consolidated lung. This has a complex appearance. A few small bubbles of air within the presumed pleural fluid these is suspicion for a bronchopleural fistula. There is a small left pleural effusion with minor dependent subsegmental atelectasis. 11/11  Transferred to [facility] 11/12  VATS / decortication 11/13 Agitated delirium. PCCM consult 11/14 One unit PRBCs for Hgb 6.9 11/14 VATS path neg for malignancy  11/15 Chest tube removed   LINES / TUBES: L rad A-line 11/12 >> 11/14 R chest tubes 11/12 >>11/15  R IJ CVL 11/12 >>   CULTURES: 11/10 Urine >>> neg 11/09 Blood >> Strep 11/11 Blood >> ng 11/12 Pleural fluid >> group B strep  ANTIBIOTICS: Cefepime  11/9 >>> 11/13 Flagyl 11/9 >> 11/14 Aztreonam 11/13 >> 11/14 Vancomycin 11/10 >> 11/15 Ceftriaxone 11/14 >>    SUBJ: Improved appetite -sitting up chair , eating , walked in room  Decreased pain, chest tube removed 11/15    VITAL SIGNS: Temp:  [97.7 F (36.5 C)-99.2 F (37.3 C)] 98.2 F (36.8 C) (11/16 0715) Pulse Rate:  [86-122] 92 (11/16 0800) Resp:  [15-25] 16 (11/16 0800) BP: (94-150)/(46-83) 109/59 mmHg (11/16 0800) SpO2:   [88 %-100 %] 100 % (11/16 0820) Weight:  [89 kg (196 lb 3.4 oz)] 89 kg (196 lb 3.4 oz) (11/16 0500)  HEMODYNAMICS:   VENTILATOR SETTINGS:   INTAKE / OUTPUT: Intake/Output     11/15 0701 - 11/16 0700 11/16 0701 - 11/17 0700   P.O. 900    I.V. (mL/kg) 480 (5.4) 20 (0.2)   Blood     IV Piggyback 100    Total Intake(mL/kg) 1480 (16.6) 20 (0.2)   Urine (mL/kg/hr) 2575 (1.2) 200 (1.4)   Chest Tube     Total Output 2575 200   Net -1095 -180         PHYSICAL EXAMINATION: General: NAD Neuro:  No focal deficits HEENT: mild periorbital edema, otherwise WNL Cardiovascular:  RRR /ST , no m/r/g  Lungs: Clear , decreased BS in bases  Abdomen:  Soft, nontender, +BS Ext; warm, no edema   LABS:  Recent Labs Lab 05/11/13 0420 05/12/13 0413 05/13/13 0419  HGB 6.9* 7.9* 7.1*  HCT 19.7* 22.6* 21.0*  WBC 17.9* 16.8* 16.9*  PLT 337 429* 472*    Recent Labs Lab 05/06/13 1649  05/09/13 1305 05/10/13 0355 05/11/13 0420 05/12/13 0413 05/13/13 0419  NA 131*  < > 130* 131* 134* 131* 132*  K 2.3*  < > 4.2 4.1 3.3* 3.1* 3.0*  CL 99  < > 99 100 102 99 98  CO2 20  < > 20 20 20 21 23   GLUCOSE 322*  < > 335* 292* 159* 95 150*  BUN 16  < > 22  25* 23 19 17   CREATININE 0.91  < > 1.05 1.18* 1.15* 1.17* 1.12*  CALCIUM 8.7  < > 8.1* 7.9* 7.6* 7.6* 7.3*  MG 1.4*  --   --   --   --   --   --   < > = values in this interval not displayed.  CXR: Removal of the remaining right chest tube. No pneumothorax.  . Improved vascular congestion.   ASSESSMENT / PLAN:  Acute encephalopathy - resolved   Pain mgmt - not using PCA > changed to PRN fentanyl Will minimize narcs /sedating rx At one time family reported pt smoked weed dipped in embalming fluid. Doubt this is contributory   R empyema, s/p VATS / decortication - Group B strep (blood cx result is probably same organism, misidentified) Post op mgmt per TCTS Ct ancef for strep - augmentin on discharge, Cont BD  Mobilize pt  DM II /  Hyperglycemia, well-controlled Cont ACHS CBGs/SSI  Anemia of acute blood loss No ongoing bleeding Transfuse for Hgb < 7.0   Hypokalemia  Replaced K+, chk Mg  Best Practice  Lovenox /DVT proph. Mobilize pt  D/c foley  Transfer to 2000   [facility]  [phone]    05/13/2013, 8:35 AM

## 2013-05-13 NOTE — Progress Notes (Signed)
Entered the pt's room at 1345 and the pt c/o "not feeling right".   Rhythm remained ST at a higher rate, however, frequent pvcs - every 4th to 8th beat.  Pt stated she was not sure what it was but she was also SOB.  02 sat remained in the upper 90s on 2L N/C.  Pt was admin. 1600 potassium dose at that time.  Pt had a potassium of 3.0 in the AM and had already received PO.  Magnesium was 1.5 and had not been replaced.  Hgb 7.1 - no transfusion orders this AM.  Paged Dr. Vassie Loll.

## 2013-05-13 NOTE — Progress Notes (Signed)
Tachy with PVC; improved but still present despite K and mag runs   Recent Labs Lab 05/09/13 1305 05/10/13 0355 05/11/13 0420 05/12/13 0413 05/13/13 0419  HGB 8.2* 7.6* 6.9* 7.9* 7.1*    PLAN - recheck cbc, bmet, lactate, trop - hold transfer till above sorted out   Dr. Kalman Shan, M.D., [facility].C.P Pulmonary and Critical Care Medicine Staff Physician [facility] [place] [facility] Pager: [phone], If no answer or between  15:00h - 7:00h: call [phone]  05/13/2013 4:08 PM

## 2013-05-13 NOTE — Progress Notes (Addendum)
Tachycardia continues  Lbas shows worsening hyponatremia to 129 Mental status ok RN feels patient euvolemic  Orthostat vitals" 17mm sbp drop with standing but no change in HR  Plan Start low Na workup: sounds like acout on chronic  - sr osm, tsh, cortisol  - ur osm. Na, uric acid, creat Cancel floor tx   Dr. Kalman Shan, M.D., [facility].C.P Pulmonary and Critical Care Medicine Staff Physician [facility] [place] [facility] Pager: [phone], If no answer or between  15:00h - 7:00h: call [phone]  05/13/2013 6:56 PM

## 2013-05-13 NOTE — Progress Notes (Signed)
4 Days Post-Op Procedure(s) (LRB): VIDEO ASSISTED THORACOSCOPY (VATS)/DECORTICATION (Right) WEDGE RESECTION RIGHT LOWER LOBE (Right) Subjective: No complaints  Objective: Vital signs in last 24 hours: Temp:  [97.7 F (36.5 C)-99.2 F (37.3 C)] 98.2 F (36.8 C) (11/16 0715) Pulse Rate:  [86-122] 102 (11/16 1000) Cardiac Rhythm:  [-] Normal sinus rhythm (11/16 0800) Resp:  [15-25] 16 (11/16 1000) BP: (89-150)/(46-73) 89/49 mmHg (11/16 1000) SpO2:  [90 %-100 %] 100 % (11/16 1000) Weight:  [89 kg (196 lb 3.4 oz)] 89 kg (196 lb 3.4 oz) (11/16 0500)  Hemodynamic parameters for last 24 hours:    Intake/Output from previous day: 11/15 0701 - 11/16 0700 In: 1480 [P.O.:900; I.V.:480; IV Piggyback:100] Out: 2575 [Urine:2575] Intake/Output this shift: Total I/O In: 20 [I.V.:20] Out: 200 [Urine:200]  General appearance: alert and cooperative Neurologic: intact Heart: regular rate and rhythm, S1, S2 normal, no murmur, click, rub or gallop Lungs: clear to auscultation bilaterally Extremities: extremities normal, atraumatic, no cyanosis or edema Wound: some serous drainage from chest tube site  Lab Results:  Recent Labs  05/12/13 0413 05/13/13 0419  WBC 16.8* 16.9*  HGB 7.9* 7.1*  HCT 22.6* 21.0*  PLT 429* 472*   BMET:  Recent Labs  05/12/13 0413 05/13/13 0419  NA 131* 132*  K 3.1* 3.0*  CL 99 98  CO2 21 23  GLUCOSE 95 150*  BUN 19 17  CREATININE 1.17* 1.12*  CALCIUM 7.6* 7.3*    PT/INR: No results found for this basename: LABPROT, INR,  in the last 72 hours ABG    Component Value Date/Time   PHART 7.383 05/10/2013 0353   HCO3 22.4 05/10/2013 0353   TCO2 24 05/10/2013 0353   ACIDBASEDEF 2.0 05/10/2013 0353   O2SAT 96.0 05/10/2013 0353   CBG (last 3)   Recent Labs  05/12/13 1827 05/12/13 2149 05/13/13 0713  GLUCAP 251* 160* 96   CLINICAL DATA: Empyema.  EXAM:  PORTABLE CHEST - 1 VIEW  COMPARISON: 05/12/2013  FINDINGS:  Status post removal of the  remaining right chest tube. No  pneumothorax.  Vascular congestion has improved. Persistent lung base opacity is  noted likely combination of pleural effusions and atelectasis.  No pneumothorax. Right internal jugular central venous line is  stable.  IMPRESSION:  1. Removal of the remaining right chest tube. No pneumothorax.  2. Improved vascular congestion.  Electronically Signed  By: Amie Portland M.D.  On: 05/13/2013 08:10  Assessment/Plan: S/P Procedure(s) (LRB): VIDEO ASSISTED THORACOSCOPY (VATS)/DECORTICATION (Right) WEDGE RESECTION RIGHT LOWER LOBE (Right)  CXR stable with all tubes out. The serous drainage from the chest tube site will stop. Keep covered and change dressing as needed. Antibiotics and further care per CCM.   LOS: 7 days    Pernella Ackerley K 05/13/2013

## 2013-05-13 NOTE — Progress Notes (Signed)
[facility] ADULT ICU REPLACEMENT PROTOCOL FOR AM LAB REPLACEMENT ONLY  The patient does apply for the [facility] Adult ICU Electrolyte Replacment Protocol based on the criteria listed below:   1. Is GFR >/= 40 ml/min? yes  Patient's GFR today is 53 2. Is urine output >/= 0.5 ml/kg/hr for the last 6 hours? yes Patient's UOP is 1.2 ml/kg/hr 3. Is BUN < 60 mg/dL? yes  Patient's BUN today is 17 4. Abnormal electrolyte(s): Potassium 5. Ordered repletion with: Potassium per Protocol  Kelli Summers P 05/13/2013 6:05 AM

## 2013-05-13 NOTE — Progress Notes (Signed)
Called orthostatic measurements to Dr. Laray Anger  Lying - 141/75 with HR of 123 Sitting - 122/60 with HR of 120 Standing - 124/62 with HR of 125  No new orders at this time

## 2013-05-14 ENCOUNTER — Inpatient Hospital Stay (HOSPITAL_COMMUNITY): Payer: 59

## 2013-05-14 ENCOUNTER — Encounter (HOSPITAL_COMMUNITY): Payer: Self-pay | Admitting: Thoracic Surgery (Cardiothoracic Vascular Surgery)

## 2013-05-14 LAB — CBC
HCT: 21.2 % — ABNORMAL LOW (ref 36.0–46.0)
Hemoglobin: 7.1 g/dL — ABNORMAL LOW (ref 12.0–15.0)
MCH: 30.3 pg (ref 26.0–34.0)
MCHC: 33.5 g/dL (ref 30.0–36.0)
MCV: 90.6 fL (ref 78.0–100.0)
Platelets: 595 10*3/uL — ABNORMAL HIGH (ref 150–400)
RBC: 2.34 MIL/uL — ABNORMAL LOW (ref 3.87–5.11)
RDW: 14.4 % (ref 11.5–15.5)
WBC: 18.2 10*3/uL — ABNORMAL HIGH (ref 4.0–10.5)

## 2013-05-14 LAB — ANAEROBIC CULTURE

## 2013-05-14 LAB — GLUCOSE, CAPILLARY
Glucose-Capillary: 103 mg/dL — ABNORMAL HIGH (ref 70–99)
Glucose-Capillary: 163 mg/dL — ABNORMAL HIGH (ref 70–99)
Glucose-Capillary: 133 mg/dL — ABNORMAL HIGH (ref 70–99)
Glucose-Capillary: 259 mg/dL — ABNORMAL HIGH (ref 70–99)

## 2013-05-14 LAB — BASIC METABOLIC PANEL
BUN: 15 mg/dL (ref 6–23)
CO2: 25 mEq/L (ref 19–32)
Calcium: 7.7 mg/dL — ABNORMAL LOW (ref 8.4–10.5)
Chloride: 98 mEq/L (ref 96–112)
Creatinine, Ser: 1.07 mg/dL (ref 0.50–1.10)
GFR calc Af Amer: 65 mL/min — ABNORMAL LOW (ref >90)
GFR calc non Af Amer: 57 mL/min — ABNORMAL LOW (ref >90)
Glucose, Bld: 126 mg/dL — ABNORMAL HIGH (ref 70–99)
Potassium: 3.7 mEq/L (ref 3.5–5.1)
Sodium: 131 mEq/L — ABNORMAL LOW (ref 135–145)

## 2013-05-14 LAB — URIC ACID, RANDOM URINE: Uric Acid, Urine: 42 mg/dL

## 2013-05-14 LAB — CORTISOL: Cortisol, Plasma: 10.3 ug/dL

## 2013-05-14 MED ORDER — METOPROLOL SUCCINATE ER 25 MG PO TB24
25.0000 mg | ORAL_TABLET | Freq: Every day | ORAL | Status: DC
  Administered 2013-05-14 – 2013-05-17 (×4): 25 mg via ORAL
  Filled 2013-05-14 (×4): qty 1

## 2013-05-14 NOTE — Progress Notes (Signed)
Physical Therapy Treatment Patient Details Name: Kelli Summers MRN: [id number] DOB: 22-Jan-1956 Today's Date: 05/14/2013 Time: 6644-0347 PT Time Calculation (min): 23 min  PT Assessment / Plan / Recommendation  History of Present Illness Kelli Summers is a 57 y.o. female with a past medical history of, diabetes, gastroparesis, who was recently hospitalized for hyperglycemia. She recently returned from [place] on October 29 and was experiencing fever and body aches. There was a concern about Chikungunya virus. However, she was noted to be afebrile in the hospital. She was discharged after her blood sugars were adequately controlled. She was doing well on her discharge day. Then on this past Thursday she started feeling worse again. Started having body aches, joint pains and headaches. And, then she had a temperature of 101F. She's been short of breath and has had chest pain, especially when lying on the right side. 11/12 underwent VATS for RLL resection due to empyema.   PT Comments   Pt remains slow to process and slow to mobilize. Pt says she has assistance at home.  At current functional level pt needs 24 hour assist to return home.  Follow Up Recommendations  Supervision/Assistance - 24 hour;Home health PT     Does the patient have the potential to tolerate intense rehabilitation     Barriers to Discharge        Equipment Recommendations  Rolling walker with 5" wheels    Recommendations for Other Services    Frequency Min 3X/week   Progress towards PT Goals Progress towards PT goals: Progressing toward goals  Plan Discharge plan needs to be updated    Precautions / Restrictions Precautions Precautions: Fall   Pertinent Vitals/Pain See flow sheet.    Mobility  Bed Mobility Supine to Sit: 3: Mod assist;HOB elevated Sitting - Scoot to Edge of Bed: 4: Min assist Sit to Supine: 4: Min assist;HOB elevated Details for Bed Mobility Assistance: Assist to bring trunk  up. Transfers Sit to Stand: 4: Min assist;With upper extremity assist;From bed Stand to Sit: 3: Mod assist;With upper extremity assist;To bed Details for Transfer Assistance: Assist to control descent to bed with pt sitting very close to edge of bed. Ambulation/Gait Ambulation/Gait Assistance: 4: Min assist Ambulation Distance (Feet): 55 Feet Assistive device: Rolling walker Ambulation/Gait Assistance Details: Verbal/tactile cues for hand placement and to steer walker.  Pt running in to objects on the left. Gait Pattern: Step-through pattern;Decreased step length - right;Decreased step length - left;Shuffle Gait velocity: slow    Exercises     PT Diagnosis:    PT Problem List:   PT Treatment Interventions:     PT Goals (current goals can now be found in the care plan section)    Visit Information  Last PT Received On: 05/14/13 Assistance Needed: +2 (for amb to follow with chair.) History of Present Illness: Kelli Summers is a 57 y.o. female with a past medical history of, diabetes, gastroparesis, who was recently hospitalized for hyperglycemia. She recently returned from [place] on October 29 and was experiencing fever and body aches. There was a concern about Chikungunya virus. However, she was noted to be afebrile in the hospital. She was discharged after her blood sugars were adequately controlled. She was doing well on her discharge day. Then on this past Thursday she started feeling worse again. Started having body aches, joint pains and headaches. And, then she had a temperature of 101F. She's been short of breath and has had chest pain, especially when lying on the  right side. 11/12 underwent VATS for RLL resection due to empyema.    Subjective Data      Cognition  Cognition Arousal/Alertness: Awake/alert Behavior During Therapy: Flat affect Overall Cognitive Status: Impaired/Different from baseline Current Attention Level: Sustained Following Commands: Follows one step  commands with increased time Safety/Judgement: Decreased awareness of safety;Decreased awareness of deficits Problem Solving: Slow processing;Decreased initiation;Requires verbal cues;Requires tactile cues    Balance  Static Sitting Balance Static Sitting - Balance Support: Bilateral upper extremity supported;Feet supported Static Sitting - Level of Assistance: 5: Stand by assistance Static Standing Balance Static Standing - Balance Support: Bilateral upper extremity supported Static Standing - Level of Assistance: 4: Min assist  End of Session PT - End of Session Activity Tolerance: Patient limited by fatigue Patient left: in bed;with call bell/phone within reach;with bed alarm set Nurse Communication: Mobility status   GP     Keval Nam 05/14/2013, 2:26 PM  [employer] PT [phone]

## 2013-05-14 NOTE — Progress Notes (Signed)
Pt transferred to unit 2000 via bed.  Pt's daughter, Lanora Manis, was called - no answer on cell / left voicemail with unit, room# and phone # to unit.  All VS WNL with exception of HR which was in the 120s, as it has been - MD aware.  Pt was admin. 1000 meds prior to transfer.  Report called to [facility].

## 2013-05-14 NOTE — Progress Notes (Signed)
5 Days Post-Op Procedure(s) (LRB): VIDEO ASSISTED THORACOSCOPY (VATS)/DECORTICATION (Right) WEDGE RESECTION RIGHT LOWER LOBE (Right) Subjective: Some soreness this AM Ambulated yesterday  Objective: Vital signs in last 24 hours: Temp:  [97.9 F (36.6 C)-99.1 F (37.3 C)] 99.1 F (37.3 C) (11/17 0400) Pulse Rate:  [53-127] 113 (11/17 0600) Cardiac Rhythm:  [-] Sinus tachycardia (11/17 0600) Resp:  [16-38] 19 (11/17 0600) BP: (89-160)/(49-78) 137/66 mmHg (11/17 0600) SpO2:  [92 %-100 %] 92 % (11/17 0600) Weight:  [195 lb 12.3 oz (88.8 kg)] 195 lb 12.3 oz (88.8 kg) (11/17 0500)  Hemodynamic parameters for last 24 hours:    Intake/Output from previous day: 11/16 0701 - 11/17 0700 In: 1220 [P.O.:720; I.V.:300; IV Piggyback:200] Out: 2525 [Urine:2525] Intake/Output this shift:    General appearance: alert and cooperative Neurologic: no focal deficit Heart: tachy, regular Lungs: diminished breath sounds bibasilar Abdomen: normal findings: soft, non-tender  Lab Results:  Recent Labs  05/13/13 1625 05/14/13 0400  WBC 18.5* 18.2*  HGB 7.7* 7.1*  HCT 22.8* 21.2*  PLT 590* 595*   BMET:  Recent Labs  05/13/13 1625 05/14/13 0400  NA 129* 131*  K 3.7 3.7  CL 95* 98  CO2 24 25  GLUCOSE 239* 126*  BUN 16 15  CREATININE 1.16* 1.07  CALCIUM 7.4* 7.7*    PT/INR: No results found for this basename: LABPROT, INR,  in the last 72 hours ABG    Component Value Date/Time   PHART 7.383 05/10/2013 0353   HCO3 22.4 05/10/2013 0353   TCO2 24 05/10/2013 0353   ACIDBASEDEF 2.0 05/10/2013 0353   O2SAT 96.0 05/10/2013 0353   CBG (last 3)   Recent Labs  05/13/13 1141 05/13/13 1746 05/13/13 2132  GLUCAP 113* 211* 159*    Assessment/Plan: S/P Procedure(s) (LRB): VIDEO ASSISTED THORACOSCOPY (VATS)/DECORTICATION (Right) WEDGE RESECTION RIGHT LOWER LOBE (Right) Plan for transfer to step-down: see transfer orders Group B strep empyema- on ancef CT are out Mental status  improved dramatically over the weekend DM- CBG better controlled Ambulate Transfer to 2000   LOS: 8 days    Corwyn Vora C 05/14/2013

## 2013-05-14 NOTE — Progress Notes (Signed)
PULMONARY  / CRITICAL CARE MEDICINE  Name: Kelli Summers MRN: [id number] DOB: Apr 16, 1956    ADMISSION DATE:  05/06/2013 CONSULTATION DATE:  05/10/2013  REFERRING MD :  Garnetta Buddy PRIMARY SERVICE:  TRH  CHIEF COMPLAINT:  Acute encephalopathy  BRIEF PATIENT DESCRIPTION: 57 yo with past medical history of chronic pain, depression and DM presenting to [facility] ED with malaise, fever, chest pain, dyspnea and hyperglycemia.  Chest imaging revealed loculated effusion.s transferred to [facility] under TCTS and underwent VATS / decortication.  She exhibited fluctuating levels of consciousness pre and post operatively.  PCCM was asked to evaluate.  SIGNIFICANT EVENTS / STUDIES:  11/09  Presented to [facility] with malaise, fever, dyspnea and hyperglycemia 11/10  Chest CT:  Loculated right pleural effusion with associated atelectatic/ consolidated lung. This has a complex appearance. A few small bubbles of air within the presumed pleural fluid these is suspicion for a bronchopleural fistula. There is a small left pleural effusion with minor dependent subsegmental atelectasis. 11/11  Transferred to [facility] 11/12  VATS / decortication 11/13 Agitated delirium. PCCM consult 11/14 One unit PRBCs for Hgb 6.9 11/14 VATS path neg for malignancy  11/15 Chest tube removed  11/16 walked 11/17 transferred to floor  LINES / TUBES: L rad A-line 11/12 >> 11/14 R chest tubes 11/12 >>11/15  R IJ CVL 11/12 >>   CULTURES: 11/10 Urine >>> neg 11/09 Blood >> Strep 11/11 Blood >> ng 11/12 Pleural fluid >> group B strep  ANTIBIOTICS: Cefepime  11/9 >>> 11/13 Flagyl 11/9 >> 11/14 Aztreonam 11/13 >> 11/14 Vancomycin 11/10 >> 11/15 Ceftriaxone 11/14 >>    SUBJ: Feels well, walked yesterday, appetite good   VITAL SIGNS: Temp:  [97.9 F (36.6 C)-99.1 F (37.3 C)] 98.3 F (36.8 C) (11/17 0950) Pulse Rate:  [53-127] 123 (11/17 0950) Resp:  [15-38] 18 (11/17 0950) BP: (95-160)/(53-81) 138/81 mmHg (11/17 0950) SpO2:  [92 %-100 %] 94 %  (11/17 0950) Weight:  [88.8 kg (195 lb 12.3 oz)] 88.8 kg (195 lb 12.3 oz) (11/17 0500)  HEMODYNAMICS:   VENTILATOR SETTINGS:   INTAKE / OUTPUT: Intake/Output     11/16 0701 - 11/17 0700 11/17 0701 - 11/18 0700   P.O. 720    I.V. (mL/kg) 320 (3.6) 20 (0.2)   IV Piggyback 200    Total Intake(mL/kg) 1240 (14) 20 (0.2)   Urine (mL/kg/hr) 2525 (1.2) 525 (1.5)   Stool  1 (0)   Total Output 2525 526   Net -1285 -506         PHYSICAL EXAMINATION:  Gen: well appearing, good spirits HEENT: NCAT OP clear PULM: lungs clear CV: Tachy, regular AB: BS+, soft, nontender Ext: warm, trace edema Neuro: A&Ox4, MAEW  LABS:  Recent Labs Lab 05/13/13 0419 05/13/13 1625 05/14/13 0400  HGB 7.1* 7.7* 7.1*  HCT 21.0* 22.8* 21.2*  WBC 16.9* 18.5* 18.2*  PLT 472* 590* 595*    Recent Labs Lab 05/11/13 0420 05/12/13 0413 05/13/13 0419 05/13/13 1625 05/14/13 0400  NA 134* 131* 132* 129* 131*  K 3.3* 3.1* 3.0* 3.7 3.7  CL 102 99 98 95* 98  CO2 20 21 23 24 25   GLUCOSE 159* 95 150* 239* 126*  BUN 23 19 17 16 15   CREATININE 1.15* 1.17* 1.12* 1.16* 1.07  CALCIUM 7.6* 7.6* 7.3* 7.4* 7.7*  MG  --   --  1.5 2.0  --   PHOS  --   --   --  2.6  --     CXR: lungs  clear with R pleural thickening, no chest tube   ASSESSMENT / PLAN:  Acute encephalopathy - resolved   Pain mgmt - percocet prn; minimize sedatives otherwise   R empyema, s/p VATS / decortication - Group B strep (blood cx result is probably same organism, misidentified) Post op mgmt per TCTS Plan Augmentin for 4 weeks total Cont BD  Mobilize pt  DM II / Hyperglycemia, well-controlled Cont ACHS CBGs/SSI  Anemia of acute blood loss No ongoing bleeding Transfuse for Hgb < 7.0   Hypokalemia  Replaced K+, chk Mg  Best Practice  Lovenox /DVT proph. Mobilize pt  D/c foley  Transfer to [facility] 11/18 AM   MCQUAID, DOUGLAS [facility]  [phone]    05/14/2013, 11:03 AM

## 2013-05-15 ENCOUNTER — Inpatient Hospital Stay (HOSPITAL_COMMUNITY): Payer: 59

## 2013-05-15 DIAGNOSIS — R5381 Other malaise: Secondary | ICD-10-CM | POA: Diagnosis present

## 2013-05-15 LAB — CBC
HCT: 23 % — ABNORMAL LOW (ref 36.0–46.0)
Hemoglobin: 7.6 g/dL — ABNORMAL LOW (ref 12.0–15.0)
MCH: 30 pg (ref 26.0–34.0)
MCHC: 33 g/dL (ref 30.0–36.0)
MCV: 90.9 fL (ref 78.0–100.0)
Platelets: 739 10*3/uL — ABNORMAL HIGH (ref 150–400)
RBC: 2.53 MIL/uL — ABNORMAL LOW (ref 3.87–5.11)
RDW: 14.7 % (ref 11.5–15.5)
WBC: 17.7 10*3/uL — ABNORMAL HIGH (ref 4.0–10.5)

## 2013-05-15 LAB — CULTURE, BLOOD (ROUTINE X 2)
Culture: NO GROWTH
Culture: NO GROWTH

## 2013-05-15 LAB — BASIC METABOLIC PANEL
BUN: 13 mg/dL (ref 6–23)
CO2: 26 mEq/L (ref 19–32)
Calcium: 7.9 mg/dL — ABNORMAL LOW (ref 8.4–10.5)
Chloride: 97 mEq/L (ref 96–112)
Creatinine, Ser: 1.1 mg/dL (ref 0.50–1.10)
GFR calc Af Amer: 63 mL/min — ABNORMAL LOW (ref >90)
GFR calc non Af Amer: 55 mL/min — ABNORMAL LOW (ref >90)
Glucose, Bld: 194 mg/dL — ABNORMAL HIGH (ref 70–99)
Potassium: 3.7 mEq/L (ref 3.5–5.1)
Sodium: 132 mEq/L — ABNORMAL LOW (ref 135–145)

## 2013-05-15 LAB — BODY FLUID CULTURE

## 2013-05-15 LAB — GLUCOSE, CAPILLARY
Glucose-Capillary: 178 mg/dL — ABNORMAL HIGH (ref 70–99)
Glucose-Capillary: 153 mg/dL — ABNORMAL HIGH (ref 70–99)
Glucose-Capillary: 258 mg/dL — ABNORMAL HIGH (ref 70–99)
Glucose-Capillary: 189 mg/dL — ABNORMAL HIGH (ref 70–99)

## 2013-05-15 MED ORDER — SODIUM CHLORIDE 0.9 % IJ SOLN: 10.0000 mL | INTRAMUSCULAR | Status: DC | PRN

## 2013-05-15 MED ORDER — CEPHALEXIN 500 MG PO CAPS: 500.0000 mg | ORAL_CAPSULE | Freq: Two times a day (BID) | ORAL | Status: DC

## 2013-05-15 MED ORDER — INSULIN DETEMIR 100 UNIT/ML ~~LOC~~ SOLN
34.0000 [IU] | Freq: Every day | SUBCUTANEOUS | Status: DC
  Administered 2013-05-15 – 2013-05-16 (×2): 34 [IU] via SUBCUTANEOUS
  Filled 2013-05-15 (×3): qty 0.34

## 2013-05-15 MED ORDER — DM-GUAIFENESIN ER 30-600 MG PO TB12
1.0000 | ORAL_TABLET | Freq: Two times a day (BID) | ORAL | Status: DC
  Administered 2013-05-15 – 2013-05-18 (×6): 1 via ORAL
  Filled 2013-05-15 (×9): qty 1

## 2013-05-15 MED ORDER — CEFUROXIME AXETIL 500 MG PO TABS
500.0000 mg | ORAL_TABLET | Freq: Two times a day (BID) | ORAL | Status: DC
  Administered 2013-05-15 – 2013-05-16 (×2): 500 mg via ORAL
  Filled 2013-05-15 (×4): qty 1

## 2013-05-15 MED ORDER — DULOXETINE HCL 60 MG PO CPEP
60.0000 mg | ORAL_CAPSULE | Freq: Every day | ORAL | Status: DC
  Administered 2013-05-15 – 2013-05-18 (×4): 60 mg via ORAL
  Filled 2013-05-15 (×4): qty 1

## 2013-05-15 NOTE — Progress Notes (Addendum)
      [address]       Kelli Summers [address]             [phone]      6 Days Post-Op Procedure(s) (LRB): VIDEO ASSISTED THORACOSCOPY (VATS)/DECORTICATION (Right) WEDGE RESECTION RIGHT LOWER LOBE (Right)  Subjective:  [name] states she is feeling okay this morning.  She continues to have some incisional discomfort  Objective: Vital signs in last 24 hours: Temp:  [98.3 F (36.8 C)-99.1 F (37.3 C)] 98.4 F (36.9 C) (11/18 0403) Pulse Rate:  [109-128] 109 (11/18 0403) Cardiac Rhythm:  [-] Sinus tachycardia (11/18 0745) Resp:  [18] 18 (11/18 0403) BP: (121-155)/(64-81) 141/76 mmHg (11/18 0403) SpO2:  [93 %-99 %] 99 % (11/18 0857)  Intake/Output from previous day: 11/17 0701 - 11/18 0700 In: 1100 [P.O.:1080; I.V.:20] Out: 1278 [Urine:1276; Stool:2]  General appearance: alert, cooperative and no distress Heart: regular rate and rhythm Lungs: clear to auscultation bilaterally Wound: clean, some serous drainage on dresssing  Lab Results:  Recent Labs  05/14/13 0400 05/15/13 0612  WBC 18.2* 17.7*  HGB 7.1* 7.6*  HCT 21.2* 23.0*  PLT 595* 739*   BMET:  Recent Labs  05/14/13 0400 05/15/13 0612  NA 131* 132*  K 3.7 3.7  CL 98 97  CO2 25 26  GLUCOSE 126* 194*  BUN 15 13  CREATININE 1.07 1.10  CALCIUM 7.7* 7.9*    PT/INR: No results found for this basename: LABPROT, INR,  in the last 72 hours ABG    Component Value Date/Time   PHART 7.383 05/10/2013 0353   HCO3 22.4 05/10/2013 0353   TCO2 24 05/10/2013 0353   ACIDBASEDEF 2.0 05/10/2013 0353   O2SAT 96.0 05/10/2013 0353   CBG (last 3)   Recent Labs  05/14/13 1620 05/14/13 2045 05/15/13 0654  GLUCAP 133* 259* 178*    Assessment/Plan: S/P Procedure(s) (LRB): VIDEO ASSISTED THORACOSCOPY (VATS)/DECORTICATION (Right) WEDGE RESECTION RIGHT LOWER LOBE (Right)  1. Empyema- + Grp B Strep- on ABX per CCM 2. Chest tubes out- CXR remains stable 3. Dispo- care per primary, will  arrange 2 week follow up with Dr. Dorris Fetch   LOS: 9 days    Kelli Summers 05/15/2013  Patient seen and examined, agree with above. Still tachycardic - may need to increase beta blocker

## 2013-05-15 NOTE — Progress Notes (Addendum)
Chart reviewed.  [facility] PROGRESS NOTE  Kelli Summers [id number]:[id number] DOB: Jul 18, 1955 DOA: 05/06/2013 PCP: Colette Ribas, MD  Summary 57 yo female initially admitted to any [facility] with HCAP. Found to have loculated pleural effusion so transfer to [facility] for VATS procedure. Had encephalopathy postop, which has cleared.  Pleural fluid grew group B strep. Blood cultures grew strep. Transferred from critical care service to try and hospitalist on 11/18.  Assessment/Plan:  HCAP/empyema/group b strep. Dr. Ulyses Jarred note states "Augmentin for 4 weeks." Patient is penicillin allergic. Will discontinue Ancef. Start Ceftin 500 mg twice a day. Discontinue central line. Discontinue telemetry. Wean oxygen. Home in a day or 2 if stable.    Diabetes mellitus type 2, uncontrolled: Have increased Levemir.continue NovoLog with meals.    Acute respiratory failure with hypoxia resolved    Encephalopathy acute Resolved    Anemia, blood loss    Bacteremia due to Streptococcus  Depression: Resume Cymbalta.    Physical deconditioning   Code Status:full Family Communication: multiple at bedside Disposition Plan: home with PT in 1-2 days if stable  SIGNIFICANT EVENTS / STUDIES:  11/09 Presented to [facility] with malaise, fever, dyspnea and hyperglycemia  11/10 Chest CT: Loculated right pleural effusion with associated atelectatic/ consolidated lung. This has a complex appearance. A few small bubbles of air within the presumed pleural fluid these is suspicion for a bronchopleural fistula. There is a small left pleural effusion with minor dependent subsegmental atelectasis.  11/11 Transferred to [facility]  11/12 VATS / decortication  11/13 Agitated delirium. PCCM consult  11/14 One unit PRBCs for Hgb 6.9  11/14 VATS path neg for malignancy  11/15 Chest tube removed  11/16 walked  11/17 transferred to floor  LINES / TUBES:  L rad A-line 11/12 >> 11/14  R chest tubes 11/12 >>11/15  R IJ CVL  11/12 >>  CULTURES:  11/10 Urine >>> neg  11/09 Blood >> Strep  11/11 Blood >> ng  11/12 Pleural fluid >> group B strep  ANTIBIOTICS:  Cefepime 11/9 >>> 11/13  Flagyl 11/9 >> 11/14  Aztreonam 11/13 >> 11/14  Vancomycin 11/10 >> 11/15  ancef 11/14 >>   Consultants:  TCTS PCCM   HPI/Subjective: Some cough. No dyspnea.   Objective: Filed Vitals:   05/15/13 0403  BP: 141/76  Pulse: 109  Temp: 98.4 F (36.9 C)  Resp: 18    Intake/Output Summary (Last 24 hours) at 05/15/13 1119 Last data filed at 05/15/13 0900  Gross per 24 hour  Intake   1320 ml  Output   1053 ml  Net    267 ml   Filed Weights   05/09/13 0602 05/13/13 0500 05/14/13 0500  Weight: 90.3 kg (199 lb 1.2 oz) 89 kg (196 lb 3.4 oz) 88.8 kg (195 lb 12.3 oz)    Exam:   General:  Occasional wet cough  Cardiovascular: RRR without MGR  Respiratory: cTA without WRR. Dressing CDI  Abdomen: S, NT, ND  Musculoskeletal: no cce   Data Reviewed: Basic Metabolic Panel:  Recent Labs Lab 05/12/13 0413 05/13/13 0419 05/13/13 1625 05/14/13 0400 05/15/13 0612  NA 131* 132* 129* 131* 132*  K 3.1* 3.0* 3.7 3.7 3.7  CL 99 98 95* 98 97  CO2 21 23 24 25 26   GLUCOSE 95 150* 239* 126* 194*  BUN 19 17 16 15 13   CREATININE 1.17* 1.12* 1.16* 1.07 1.10  CALCIUM 7.6* 7.3* 7.4* 7.7* 7.9*  MG  --  1.5 2.0  --   --  PHOS  --   --  2.6  --   --    Liver Function Tests:  Recent Labs Lab 05/08/13 2030 05/11/13 0420 05/12/13 0413  AST 14 12 14   ALT 13 8 8   ALKPHOS 207* 113 138*  BILITOT 0.5 0.4 0.4  PROT 5.7* 5.0* 5.3*  ALBUMIN 1.4* 1.5* 1.5*   No results found for this basename: LIPASE, AMYLASE,  in the last 168 hours  Recent Labs Lab 05/10/13 1430  AMMONIA 24   CBC:  Recent Labs Lab 05/12/13 0413 05/13/13 0419 05/13/13 1625 05/14/13 0400 05/15/13 0612  WBC 16.8* 16.9* 18.5* 18.2* 17.7*  HGB 7.9* 7.1* 7.7* 7.1* 7.6*  HCT 22.6* 21.0* 22.8* 21.2* 23.0*  MCV 88.6 89.7 89.8 90.6 90.9  PLT  429* 472* 590* 595* 739*   Cardiac Enzymes:  Recent Labs Lab 05/13/13 1625  TROPONINI <0.30   BNP (last 3 results)  Recent Labs  05/13/13 1930  PROBNP 2295.0*   CBG:  Recent Labs Lab 05/14/13 0815 05/14/13 1122 05/14/13 1620 05/14/13 2045 05/15/13 0654  GLUCAP 103* 163* 133* 259* 178*    Recent Results (from the past 240 hour(s))  CULTURE, BLOOD (ROUTINE X 2)     Status: None   Collection Time    05/06/13 11:38 PM      Result Value Range Status   Specimen Description BLOOD RIGHT HAND   Final   Special Requests BOTTLES DRAWN AEROBIC AND ANAEROBIC 5CC   Final   Culture  Setup Time     Final   Value: 05/09/2013 00:11     Performed at [facility]   Culture     Final   Value: STREPTOCOCCUS GROUP C     Note: GROWTH INSUFFICIENT FOR SENSITIVITIES     Note: Gram Stain Report Called to,Read Back By and Verified With: J BRODY AT [facility] 1707 05/08/13 BY S VANHOORNE Performed at [facility]     Performed at [facility]   Report Status 05/15/2013 FINAL   Final  CULTURE, BLOOD (ROUTINE X 2)     Status: None   Collection Time    05/06/13 11:47 PM      Result Value Range Status   Specimen Description BLOOD RIGHT ANTECUBITAL   Final   Special Requests BOTTLES DRAWN AEROBIC AND ANAEROBIC 8CC   Final   Culture NO GROWTH 5 DAYS   Final   Report Status 05/12/2013 FINAL   Final  URINE CULTURE     Status: None   Collection Time    05/07/13  2:07 AM      Result Value Range Status   Specimen Description URINE, CLEAN CATCH   Final   Special Requests NONE   Final   Culture  Setup Time     Final   Value: 05/07/2013 03:40     Performed at [facility]   Culture     Final   Value: Multiple bacterial morphotypes present, none predominant. Suggest appropriate recollection if clinically indicated.     Performed at [facility]   Report Status 05/08/2013 FINAL   Final  CULTURE, BLOOD (ROUTINE X 2)     Status: None   Collection Time    05/08/13   6:46 PM      Result Value Range Status   Specimen Description BLOOD RIGHT ARM   Final   Special Requests BOTTLES DRAWN AEROBIC AND ANAEROBIC 10CC   Final   Culture  Setup Time  Final   Value: 05/09/2013 01:08     Performed at [facility]   Culture     Final   Value: NO GROWTH 5 DAYS     Performed at [facility]   Report Status 05/15/2013 FINAL   Final  CULTURE, BLOOD (ROUTINE X 2)     Status: None   Collection Time    05/08/13  6:54 PM      Result Value Range Status   Specimen Description BLOOD RIGHT HAND   Final   Special Requests BOTTLES DRAWN AEROBIC ONLY 4CC   Final   Culture  Setup Time     Final   Value: 05/09/2013 01:12     Performed at [facility]   Culture     Final   Value: NO GROWTH 5 DAYS     Performed at [facility]   Report Status 05/15/2013 FINAL   Final  FUNGUS CULTURE W SMEAR     Status: None   Collection Time    05/09/13  9:20 AM      Result Value Range Status   Specimen Description PLEURAL FLUID RIGHT   Final   Special Requests PT ON VANCO FLAGYL MAXIPIME   Final   Fungal Smear     Final   Value: NO YEAST OR FUNGAL ELEMENTS SEEN     Performed at [facility]   Culture     Final   Value: CULTURE IN PROGRESS FOR FOUR WEEKS     Performed at [facility]   Report Status PENDING   Incomplete  AFB CULTURE WITH SMEAR     Status: None   Collection Time    05/09/13  9:20 AM      Result Value Range Status   Specimen Description PLEURAL FLUID RIGHT   Final   Special Requests PT ON VANCO FLAGYL MAXIPIME   Final   ACID FAST SMEAR     Final   Value: NO ACID FAST BACILLI SEEN     Performed at [facility]   Culture     Final   Value: CULTURE WILL BE EXAMINED FOR 6 WEEKS BEFORE ISSUING A FINAL REPORT     Performed at [facility]   Report Status PENDING   Incomplete  BODY FLUID CULTURE     Status: None   Collection Time    05/09/13  9:20 AM      Result Value Range Status   Specimen  Description PLEURAL FLUID RIGHT   Final   Special Requests PT ON VANCO FLAGYL MAXIPIME   Final   Gram Stain     Final   Value: ABUNDANT WBC PRESENT,BOTH PMN AND MONONUCLEAR     ABUNDANT GRAM POSITIVE COCCI IN CLUSTERS     Gram Stain Report Called to,Read Back By and Verified With: Gram Stain Report Called to,Read Back By and Verified With:  PUJA PATEL @0747  ON 05/10/13 BY SMIAS     Performed at [facility]   Culture     Final   Value: ABUNDANT GROUP B STREP(S.AGALACTIAE)ISOLATED     Note: This organism DOES NOT demonstrate inducible Clindamycin resistance in vitro.     Performed at [facility]   Report Status 05/15/2013 FINAL   Final   Organism ID, Bacteria GROUP B STREP(S.AGALACTIAE)ISOLATED   Final  FUNGUS CULTURE W SMEAR     Status: None   Collection Time    05/09/13  9:26 AM  Result Value Range Status   Specimen Description PLEURAL FLUID RIGHT   Final   Special Requests PT ON VANCO FLAGYL MAXIPIME   Final   Fungal Smear     Final   Value: NO YEAST OR FUNGAL ELEMENTS SEEN     Performed at [facility]   Culture     Final   Value: CULTURE IN PROGRESS FOR FOUR WEEKS     Performed at [facility]   Report Status PENDING   Incomplete  AFB CULTURE WITH SMEAR     Status: None   Collection Time    05/09/13  9:26 AM      Result Value Range Status   Specimen Description PLEURAL FLUID RIGHT   Final   Special Requests PT ON VANCO FLAGYL MAXIPIME   Final   ACID FAST SMEAR     Final   Value: NO ACID FAST BACILLI SEEN     Performed at [facility]   Culture     Final   Value: CULTURE WILL BE EXAMINED FOR 6 WEEKS BEFORE ISSUING A FINAL REPORT     Performed at [facility]   Report Status PENDING   Incomplete  BODY FLUID CULTURE     Status: None   Collection Time    05/09/13  9:26 AM      Result Value Range Status   Specimen Description PLEURAL FLUID RIGHT   Final   Special Requests PT ON VANCO FLAGYL MAXIPIME   Final   Gram Stain      Final   Value: RARE WBC PRESENT, PREDOMINANTLY PMN     NO ORGANISMS SEEN     Performed at [facility]   Culture     Final   Value: RARE GROUP B STREP(S.AGALACTIAE)ISOLATED     Note: SUSCEPTIBILITIES PERFORMED ON PREVIOUS CULTURE WITHIN THE LAST 5 DAYS.     Performed at [facility]   Report Status 05/15/2013 FINAL   Final  AFB CULTURE WITH SMEAR     Status: None   Collection Time    05/09/13  9:39 AM      Result Value Range Status   Specimen Description TISSUE PLEURAL   Final   Special Requests RIGHT PLEURAL PEEL PT ON VANCO FLAGYL MAXIPIME   Final   ACID FAST SMEAR     Final   Value: NO ACID FAST BACILLI SEEN     Performed at [facility]   Culture     Final   Value: CULTURE WILL BE EXAMINED FOR 6 WEEKS BEFORE ISSUING A FINAL REPORT     Performed at [facility]   Report Status PENDING   Incomplete  FUNGUS CULTURE W SMEAR     Status: None   Collection Time    05/09/13  9:39 AM      Result Value Range Status   Specimen Description TISSUE PLEURAL   Final   Special Requests RIGHT PLEURAL PEEL PT ON VANCO FLAGYL MAXIPIME   Final   Fungal Smear     Final   Value: NO YEAST OR FUNGAL ELEMENTS SEEN     Performed at [facility]   Culture     Final   Value: CULTURE IN PROGRESS FOR FOUR WEEKS     Performed at [facility]   Report Status PENDING   Incomplete  TISSUE CULTURE     Status: None   Collection Time    05/09/13  9:39 AM  Result Value Range Status   Specimen Description TISSUE PLEURAL   Final   Special Requests RIGHT PLEURAL PEEL PT ON VANCO FLAGYL MAXIPIME   Final   Gram Stain     Final   Value: RARE WBC PRESENT,BOTH PMN AND MONONUCLEAR     NO ORGANISMS SEEN     Performed at [facility]   Culture     Final   Value: RARE GROUP B STREP(S.AGALACTIAE)ISOLATED     Note: TESTING AGAINST S. AGALACTIAE NOT ROUTINELY PERFORMED DUE TO PREDICTABILITY OF AMP/PEN/VAN SUSCEPTIBILITY.     Performed at [facility]   Report Status 05/13/2013 FINAL   Final  ANAEROBIC CULTURE     Status: None   Collection Time    05/09/13  9:39 AM      Result Value Range Status   Specimen Description TISSUE PLEURAL   Final   Special Requests RIGHT PLEURAL PEEL PT ON VANCO FLAYGYL MAXIPIME   Final   Gram Stain     Final   Value: RARE WBC PRESENT,BOTH PMN AND MONONUCLEAR     NO ORGANISMS SEEN     Performed at [facility]   Culture     Final   Value: NO ANAEROBES ISOLATED     Performed at [facility]   Report Status 05/14/2013 FINAL   Final  AFB CULTURE WITH SMEAR     Status: None   Collection Time    05/09/13  9:52 AM      Result Value Range Status   Specimen Description TISSUE PLEURAL   Final   Special Requests PARIETAL PLEURAL PEEL PT ON VANCO FLAGYL MAXIPIME   Final   ACID FAST SMEAR     Final   Value: NO ACID FAST BACILLI SEEN     Performed at [facility]   Culture     Final   Value: CULTURE WILL BE EXAMINED FOR 6 WEEKS BEFORE ISSUING A FINAL REPORT     Performed at [facility]   Report Status PENDING   Incomplete  FUNGUS CULTURE W SMEAR     Status: None   Collection Time    05/09/13  9:52 AM      Result Value Range Status   Specimen Description TISSUE PLEURAL   Final   Special Requests PARIETAL PLEURAL PEEL PT ON VANCO FLAGYL MAXIPIME   Final   Fungal Smear     Final   Value: NO YEAST OR FUNGAL ELEMENTS SEEN     Performed at [facility]   Culture     Final   Value: CULTURE IN PROGRESS FOR FOUR WEEKS     Performed at [facility]   Report Status PENDING   Incomplete  ANAEROBIC CULTURE     Status: None   Collection Time    05/09/13  9:52 AM      Result Value Range Status   Specimen Description TISSUE PLEURAL   Final   Special Requests PARIETAL PLEURAL PEEL PT ON VANCO FLAGYL MAXIPIME   Final   Gram Stain     Final   Value: RARE WBC PRESENT,BOTH PMN AND MONONUCLEAR     NO ORGANISMS SEEN     Performed at [facility]   Culture      Final   Value: NO ANAEROBES ISOLATED     Performed at [facility]   Report Status 05/14/2013 FINAL   Final  TISSUE CULTURE     Status: None  Collection Time    05/09/13  9:52 AM      Result Value Range Status   Specimen Description TISSUE PLEURAL   Final   Special Requests PARIETAL PLEURAL PEEL PT ON VANCO FLAGYL MAXIPIME   Final   Gram Stain     Final   Value: RARE WBC PRESENT,BOTH PMN AND MONONUCLEAR     NO ORGANISMS SEEN     Performed at [facility]   Culture     Final   Value: MODERATE GROUP B STREP(S.AGALACTIAE)ISOLATED     Note: TESTING AGAINST S. AGALACTIAE NOT ROUTINELY PERFORMED DUE TO PREDICTABILITY OF AMP/PEN/VAN SUSCEPTIBILITY.     Performed at [facility]   Report Status 05/13/2013 FINAL   Final  AFB CULTURE WITH SMEAR     Status: None   Collection Time    05/09/13 11:06 AM      Result Value Range Status   Specimen Description TISSUE LUNG RIGHT   Final   Special Requests RIGHT LOWER LOBE PT ON VANCOMYCIN,FLAGYL,MAXIPIME   Final   ACID FAST SMEAR     Final   Value: NO ACID FAST BACILLI SEEN     Performed at [facility]   Culture     Final   Value: CULTURE WILL BE EXAMINED FOR 6 WEEKS BEFORE ISSUING A FINAL REPORT     Performed at [facility]   Report Status PENDING   Incomplete  FUNGUS CULTURE W SMEAR     Status: None   Collection Time    05/09/13 11:06 AM      Result Value Range Status   Specimen Description TISSUE LUNG RIGHT   Final   Special Requests RIGHT LOWER LOBE PT ON VANCOMYCIN,FLAGYL,MAXIPIME   Final   Fungal Smear     Final   Value: NO YEAST OR FUNGAL ELEMENTS SEEN     Performed at [facility]   Culture     Final   Value: CULTURE IN PROGRESS FOR FOUR WEEKS     Performed at [facility]   Report Status PENDING   Incomplete  ANAEROBIC CULTURE     Status: None   Collection Time    05/09/13 11:06 AM      Result Value Range Status   Specimen Description TISSUE LUNG RIGHT   Final    Special Requests RIGHT LOWER LOBE PT ON VANCOMYCIN,FLAGYL,MAXIPIME   Final   Gram Stain     Final   Value: RARE WBC PRESENT,BOTH PMN AND MONONUCLEAR     NO ORGANISMS SEEN     Performed at [facility]   Culture     Final   Value: NO ANAEROBES ISOLATED     Performed at [facility]   Report Status 05/14/2013 FINAL   Final  TISSUE CULTURE     Status: None   Collection Time    05/09/13 11:06 AM      Result Value Range Status   Specimen Description TISSUE LUNG RIGHT   Final   Special Requests RIGHT LOWER LOBE PT ON VANCOMYCIN,FLAGYL,MAXIPIME   Final   Gram Stain     Final   Value: RARE WBC PRESENT,BOTH PMN AND MONONUCLEAR     NO ORGANISMS SEEN     Performed at [facility]   Culture     Final   Value: RARE GROUP B STREP(S.AGALACTIAE)ISOLATED     Note: TESTING AGAINST S. AGALACTIAE NOT ROUTINELY PERFORMED DUE TO PREDICTABILITY OF AMP/PEN/VAN SUSCEPTIBILITY.  Performed at [facility]   Report Status 05/13/2013 FINAL   Final     Studies: Dg Chest 2 View  05/15/2013   CLINICAL DATA:  Short of breath  EXAM: CHEST  2 VIEW  COMPARISON:  Yesterday  FINDINGS: Diffuse interstitial infiltrates worse throughout the right lung are stable. Bilateral pleural effusions stable. Mild cardiomegaly unchanged. No pneumothorax. Right internal jugular central venous catheter stable.  IMPRESSION: Bilateral effusions and interstitial edema are stable.   Electronically Signed   By: Maryclare Bean M.D.   On: 05/15/2013 07:43   Dg Chest Port 1 View  05/14/2013   CLINICAL DATA:  Status post video-assisted thoracoscopy and right chest decortication.  EXAM: PORTABLE CHEST - 1 VIEW  COMPARISON:  05/13/2013  FINDINGS: Irregularly thickened interstitial markings, greater on the right, are without significant change. Blunting of the right costophrenic sulcus likely from pleural fluid versus pleural thickening is stable. Small bilateral pleural effusions are noted, also stable. No new lung  opacities. No pneumothorax.  Right internal jugular central venous line is stable in well positioned.  IMPRESSION: 1. No change from the previous day's study.   Electronically Signed   By: Amie Portland M.D.   On: 05/14/2013 07:32    Scheduled Meds: . bisacodyl  10 mg Oral Daily  .  ceFAZolin (ANCEF) IV  1 g Intravenous Q8H  . enoxaparin (LOVENOX) injection  40 mg Subcutaneous Q24H  . insulin aspart  0-15 Units Subcutaneous TID WC  . insulin aspart  0-5 Units Subcutaneous QHS  . insulin aspart  8 Units Subcutaneous TID WC  . insulin detemir  30 Units Subcutaneous QHS  . levalbuterol  0.63 mg Nebulization Q6H  . metoprolol succinate  25 mg Oral Daily  . potassium chloride  40 mEq Oral Once  . potassium chloride  40 mEq Oral Daily  . sodium chloride  3 mL Intravenous Q12H   Continuous Infusions: . sodium chloride 20 mL/hr at 05/14/13 0800   Time spent: 35 min  Philipe Laswell L  [facility] Pager [phone]. If 7PM-7AM, please contact night-coverage at [email or web address], password [facility] 05/15/2013, 11:19 AM  LOS: 9 days

## 2013-05-15 NOTE — Progress Notes (Signed)
Physical Therapy Treatment Patient Details Name: Kelli Summers MRN: [id number] DOB: [birth year] Today's Date: 05/15/2013 Time: 4098-1191 PT Time Calculation (min): 27 min  PT Assessment / Plan / Recommendation  History of Present Illness 57 yo with past medical history of chronic pain, depression and DM presenting to [facility] ED with malaise, fever, chest pain, dyspnea and hyperglycemia.  Chest imaging revealed loculated effusion.s transferred to [facility] under TCTS and underwent VATS / decortication.  She exhibited fluctuating levels of consciousness pre and post operatively.  PCCM was asked to evaluate.   PT Comments   Pt able to increase ambulation distance however continues to run into objects on left side.  Pt needs (A) to manage RW with max cues for overall safety.  Pt impulsive during gait and overall mobility with continue cues for safety.    Follow Up Recommendations  Supervision/Assistance - 24 hour;Home health PT     Equipment Recommendations  Rolling walker with 5" wheels    Frequency Min 3X/week   Progress towards PT Goals Progress towards PT goals: Progressing toward goals (slowly)  Plan Current plan remains appropriate    Precautions / Restrictions Precautions Precautions: Fall   Pertinent Vitals/Pain 8-9/10 back pain; RN notified and suggest heat pack; RN agrees and plans to give pain meds and heat pack; Sa02 > 93% on RA with ambulation    Mobility  Bed Mobility Bed Mobility: Supine to Sit;Sitting - Scoot to Edge of Bed;Sit to Supine Supine to Sit: 4: Min guard;HOB elevated;With rails Sit to Supine: 4: Min guard;With rail Details for Bed Mobility Assistance: Minguard for safety with cues for proper technique.  Pt quickly returned to bed with poor techinque.  Educated pt on proper technique with overall bed mobility especially with back pain.  Transfers Transfers: Sit to Stand;Stand to [employer] Transfers Sit to Stand: 4: Min guard;From bed Stand to Sit: To bed;4: Min  assist Details for Transfer Assistance: Minguard for safety with max cues for hand placement.  Pt tends to keep hands on RW and quick descent to bed without proper position.  Ambulation/Gait Ambulation/Gait Assistance: 4: Min assist Ambulation Distance (Feet): 80 Feet Assistive device: Rolling walker Ambulation/Gait Assistance Details: Max cues for RW placement.  Pt continues to run into objects on left side during ambulation.  Pt with several standing rest breaks and attempts to lean forward on RW during breaks.  Gait Pattern: Step-through pattern;Decreased step length - right;Decreased step length - left;Shuffle Gait velocity: slow Stairs: No    Exercises     PT Diagnosis:    PT Problem List:   PT Treatment Interventions:     PT Goals (current goals can now be found in the care plan section) Acute Rehab PT Goals Patient Stated Goal: did not set PT Goal Formulation: With patient Time For Goal Achievement: 05/18/13 Potential to Achieve Goals: Fair  Visit Information  Last PT Received On: 05/15/13 Assistance Needed: +1 History of Present Illness: 57 yo with past medical history of chronic pain, depression and DM presenting to [facility] ED with malaise, fever, chest pain, dyspnea and hyperglycemia.  Chest imaging revealed loculated effusion.s transferred to [facility] under TCTS and underwent VATS / decortication.  She exhibited fluctuating levels of consciousness pre and post operatively.  PCCM was asked to evaluate.    Subjective Data  Subjective: "I can't go any further my back hurts." Patient Stated Goal: did not set   Cognition  Cognition Arousal/Alertness: Awake/alert Behavior During Therapy: Flat affect;Impulsive Overall Cognitive Status: Impaired/Different from  baseline Current Attention Level: Sustained Following Commands: Follows multi-step commands inconsistently Safety/Judgement: Decreased awareness of safety;Decreased awareness of deficits General Comments: Pt impulsive with gait  and bed mobility and needs constant cues for safety.     Balance  Static Sitting Balance Static Sitting - Balance Support: Bilateral upper extremity supported;Feet supported Static Sitting - Level of Assistance: 5: Stand by assistance Static Standing Balance Static Standing - Balance Support: Bilateral upper extremity supported Static Standing - Level of Assistance: 4: Min assist  End of Session PT - End of Session Equipment Utilized During Treatment: Gait belt;Other (comment) (Pt ambulated without O2) Activity Tolerance: Patient limited by pain Patient left: in bed;with call bell/phone within reach;with bed alarm set Nurse Communication: Mobility status   GP     [name] 05/15/2013, 12:02 PM  Jake Shark, PT DPT [phone]

## 2013-05-15 NOTE — Progress Notes (Signed)
Right IJ to be removed per Md order; pt educated; Right IJ removed; pressure applied for 5 minutes; Vaseline and 4X4 dressing applied; patient instructed to keep dressing on for 24 hours; patient tolerated well; patient advised to remain flat for 30 minutes; will continue to monitor patient.  Theressa Stamps

## 2013-05-16 LAB — URINALYSIS W MICROSCOPIC + REFLEX CULTURE
Bilirubin Urine: NEGATIVE
Glucose, UA: 100 mg/dL — AB
Hgb urine dipstick: NEGATIVE
Ketones, ur: NEGATIVE mg/dL
Leukocytes, UA: NEGATIVE
Nitrite: NEGATIVE
Protein, ur: NEGATIVE mg/dL
Specific Gravity, Urine: 1.012 (ref 1.005–1.030)
Urobilinogen, UA: 0.2 mg/dL (ref 0.0–1.0)
pH: 5.5 (ref 5.0–8.0)

## 2013-05-16 LAB — BENZODIAZEPINE, QUANTITATIVE, URINE
Alprazolam (GC/LC/MS), ur confirm: NEGATIVE ng/mL
Alprazolam metabolite (GC/LC/MS), ur confirm: NEGATIVE ng/mL
Clonazepam metabolite (GC/LC/MS), ur confirm: NEGATIVE ng/mL
Diazepam (GC/LC/MS), ur confirm: NEGATIVE ng/mL
Estazolam (GC/LC/MS), ur confirm: NEGATIVE ng/mL
Flunitrazepam metabolite (GC/LC/MS), ur confirm: NEGATIVE ng/mL
Flurazepam GC/MS Conf: NEGATIVE ng/mL
Lorazepam (GC/LC/MS), ur confirm: NEGATIVE ng/mL
Midazolam (GC/LC/MS), ur confirm: 287 ng/mL
Nordiazepam GC/MS Conf: 397 ng/mL
Oxazepam GC/MS Conf: 310 ng/mL
Temazepam GC/MS Conf: 496 ng/mL
Triazolam metabolite (GC/LC/MS), ur confirm: NEGATIVE ng/mL

## 2013-05-16 LAB — GLUCOSE, CAPILLARY
Glucose-Capillary: 141 mg/dL — ABNORMAL HIGH (ref 70–99)
Glucose-Capillary: 151 mg/dL — ABNORMAL HIGH (ref 70–99)
Glucose-Capillary: 154 mg/dL — ABNORMAL HIGH (ref 70–99)
Glucose-Capillary: 57 mg/dL — ABNORMAL LOW (ref 70–99)
Glucose-Capillary: 67 mg/dL — ABNORMAL LOW (ref 70–99)
Glucose-Capillary: 95 mg/dL (ref 70–99)
Glucose-Capillary: 184 mg/dL — ABNORMAL HIGH (ref 70–99)

## 2013-05-16 MED ORDER — SODIUM CHLORIDE 0.9 % IV SOLN
INTRAVENOUS | Status: DC
  Administered 2013-05-16: 15:00:00 via INTRAVENOUS

## 2013-05-16 MED ORDER — LEVALBUTEROL HCL 0.63 MG/3ML IN NEBU
0.6300 mg | INHALATION_SOLUTION | Freq: Two times a day (BID) | RESPIRATORY_TRACT | Status: DC
  Administered 2013-05-16 – 2013-05-18 (×4): 0.63 mg via RESPIRATORY_TRACT
  Filled 2013-05-16 (×9): qty 3

## 2013-05-16 MED ORDER — DEXTROSE 5 % IV SOLN
1.0000 g | INTRAVENOUS | Status: DC
  Administered 2013-05-16 – 2013-05-18 (×3): 1 g via INTRAVENOUS
  Filled 2013-05-16 (×3): qty 10

## 2013-05-16 MED ORDER — LEVALBUTEROL HCL 0.63 MG/3ML IN NEBU: 0.6300 mg | INHALATION_SOLUTION | Freq: Four times a day (QID) | RESPIRATORY_TRACT | Status: DC | PRN

## 2013-05-16 NOTE — Progress Notes (Addendum)
      [address]       [place] [address]             [phone]      7 Days Post-Op Procedure(s) (LRB): VIDEO ASSISTED THORACOSCOPY (VATS)/DECORTICATION (Right) WEDGE RESECTION RIGHT LOWER LOBE (Right)  Subjective:  Kelli Summers is resting comfortably this morning.  No new complaints  Objective: Vital signs in last 24 hours: Temp:  [97.9 F (36.6 C)-98.1 F (36.7 C)] 98 F (36.7 C) (11/19 0528) Pulse Rate:  [103-119] 103 (11/19 0528) Cardiac Rhythm:  [-] Sinus tachycardia (11/19 0836) Resp:  [18-19] 18 (11/19 0528) BP: (105-131)/(52-69) 131/69 mmHg (11/19 0528) SpO2:  [94 %-97 %] 94 % (11/19 0728)  Intake/Output from previous day: 11/18 0701 - 11/19 0700 In: 1320 [P.O.:1320] Out: 1152 [Urine:1150; Stool:2]  General appearance: alert, cooperative and no distress Heart: regular rate and rhythm Lungs: clear to auscultation bilaterally Wound: clean and dry  Lab Results:  Recent Labs  05/14/13 0400 05/15/13 0612  WBC 18.2* 17.7*  HGB 7.1* 7.6*  HCT 21.2* 23.0*  PLT 595* 739*   BMET:  Recent Labs  05/14/13 0400 05/15/13 0612  NA 131* 132*  K 3.7 3.7  CL 98 97  CO2 25 26  GLUCOSE 126* 194*  BUN 15 13  CREATININE 1.07 1.10  CALCIUM 7.7* 7.9*    PT/INR: No results found for this basename: LABPROT, INR,  in the last 72 hours ABG    Component Value Date/Time   PHART 7.383 05/10/2013 0353   HCO3 22.4 05/10/2013 0353   TCO2 24 05/10/2013 0353   ACIDBASEDEF 2.0 05/10/2013 0353   O2SAT 96.0 05/10/2013 0353   CBG (last 3)   Recent Labs  05/15/13 2051 05/16/13 0601 05/16/13 0819  GLUCAP 189* 141* 151*    Assessment/Plan: S/P Procedure(s) (LRB): VIDEO ASSISTED THORACOSCOPY (VATS)/DECORTICATION (Right) WEDGE RESECTION RIGHT LOWER LOBE (Right)  1. Empyema- + Grp B strep- on ABX 2. CV- NSR remains tachy 3. Dispo- care per primary, follow up appointment in chart   LOS: 10 days    BARRETT, ERIN 05/16/2013  Patient seen and  examined, agree with above She looks much better today Ok for dc from a surgical standpoint once medical issues resolved

## 2013-05-16 NOTE — Progress Notes (Signed)
Hypoglycemic Event  CBG: 57  Treatment: 15 GM carbohydrate snack  Symptoms: None  Follow-up CBG: Time:1625 CBG Result:67  Possible Reasons for Event: Inadequate meal intake  Comments/MD notified: MD notified    BARNETT, Virgene Tirone M  Remember to initiate Hypoglycemia Order Set & complete

## 2013-05-16 NOTE — Progress Notes (Signed)
[facility]                                                                                Patient Demographics  Kelli Summers, is a 57 y.o. female, DOB - [birth year], XBM:[id number]  Admit date - 05/06/2013   Admitting Physician Osvaldo Shipper, MD  Outpatient Primary MD for the patient is Colette Ribas, MD  LOS - 10   Chief Complaint  Patient presents with  . Hyperglycemia      Summary    57 yo female initially admitted to any [facility] with HCAP. Found to have loculated pleural effusion so transfer to [facility] for VATS procedure. Had encephalopathy postop, which has cleared. Pleural fluid grew group B strep. Blood cultures grew strep. Transferred from critical care service to try and hospitalist on 11/18.     Assessment & Plan    1. HTN with loculated pleural effusion second empyema, Streptococcus bacteremia and Streptococcus growing out of pleural fluid. Discussed case on phone with ID physician Dr. Algis Liming, will place her on IV Rocephin due to bacteremia, total duration at least 2-3 weeks. Per ID will get TEE to rule out any cardiac focus of infection    2. Type 2 diabetes mellitus. Placed on home dose Lantus along with sliding scale NovoLog, is also on pre-meal NovoLog. Glycemic control acceptable right now  Lab Results  Component Value Date   HGBA1C 14.9* 04/30/2013    CBG (last 3)   Recent Labs  05/16/13 0601 05/16/13 0819 05/16/13 1121  GLUCAP 141* 151* 154*       3. Acute hypoxic respiratory failure with Toxic Encephalopathy both due to comminution of H. And empyema.- Solved after that's procedure along with decortication by cardiothoracic surgery on 05/09/2013. Antibiotics as in #1 above     4. Depression treated on home dose Cymbalta.     5. Anemia secondary to blood loss and anemia of chronic disease. Stable H&H. No need for transfusion for now     6. Physical deconditioning. Much improved with supportive care here.    7.  Hypertension. Stable on beta blocker continued.     8. Leukocytosis reactionary due to #1 above, monitor with plan as #above.     Code Status: Full  Family Communication:    Disposition Plan: Home   Procedures   11/09 Presented to [facility] with malaise, fever, dyspnea and hyperglycemia  11/10 Chest CT: Loculated right pleural effusion with associated atelectatic/ consolidated lung. This has a complex appearance. A few small bubbles of air within the presumed pleural fluid these is suspicion for a bronchopleural fistula. There is a small left pleural effusion with minor dependent subsegmental atelectasis.  11/11 Transferred to [facility]  11/12 VATS / decortication  11/13 Agitated delirium. PCCM consult  11/14 One unit PRBCs for Hgb 6.9  11/14 VATS path neg for malignancy  11/15 Chest tube removed  11/16 walked  11/17 transferred to floor    LINES / TUBES:  L rad A-line 11/12 >> 11/14  R chest tubes 11/12 >>11/15  R IJ CVL 11/12 >>    CULTURES:  11/10 Urine >>> neg  11/09 Blood >> Strep  11/11 Blood >> ng  11/12 Pleural fluid >> group B strep       Consults  TCTS, PCCM   Medications  Scheduled Meds: . bisacodyl  10 mg Oral Daily  . cefTRIAXone (ROCEPHIN)  IV  1 g Intravenous Q24H  . dextromethorphan-guaiFENesin  1 tablet Oral BID  . DULoxetine  60 mg Oral Daily  . enoxaparin (LOVENOX) injection  40 mg Subcutaneous Q24H  . insulin aspart  0-15 Units Subcutaneous TID WC  . insulin aspart  0-5 Units Subcutaneous QHS  . insulin aspart  8 Units Subcutaneous TID WC  . insulin detemir  34 Units Subcutaneous QHS  . levalbuterol  0.63 mg Nebulization BID  . metoprolol succinate  25 mg Oral Daily  . potassium chloride  40 mEq Oral Daily   Continuous Infusions:  PRN Meds:.acetaminophen, diazepam, levalbuterol, ondansetron (ZOFRAN) IV, ondansetron, oxyCODONE-acetaminophen    DVT Prophylaxis  Lovenox    Lab Results  Component Value Date   PLT 739* 05/15/2013     Antibiotics     Anti-infectives   Start     Dose/Rate Route Frequency Ordered Stop   05/16/13 1300  cefTRIAXone (ROCEPHIN) 1 g in dextrose 5 % 50 mL IVPB     1 g 100 mL/hr over 30 Minutes Intravenous Every 24 hours 05/16/13 1228     05/15/13 1630  cefUROXime (CEFTIN) tablet 500 mg  Status:  Discontinued     500 mg Oral 2 times daily with meals 05/15/13 1130 05/16/13 1228   05/15/13 1130  cephALEXin (KEFLEX) capsule 500 mg  Status:  Discontinued     500 mg Oral Every 12 hours 05/15/13 1126 05/15/13 1130   05/11/13 1400  ceFAZolin (ANCEF) IVPB 1 g/50 mL premix  Status:  Discontinued     1 g 100 mL/hr over 30 Minutes Intravenous 3 times per day 05/11/13 1018 05/15/13 1121   05/11/13 1300  vancomycin (VANCOCIN) IVPB 1000 mg/200 mL premix  Status:  Discontinued     1,000 mg 200 mL/hr over 60 Minutes Intravenous Every 24 hours 05/11/13 1257 05/12/13 1151   05/10/13 1545  aztreonam (AZACTAM) 1 g in dextrose 5 % 50 mL IVPB  Status:  Discontinued     1 g 100 mL/hr over 30 Minutes Intravenous 3 times per day 05/10/13 1542 05/11/13 1018   05/07/13 1000  vancomycin (VANCOCIN) IVPB 1000 mg/200 mL premix  Status:  Discontinued     1,000 mg 200 mL/hr over 60 Minutes Intravenous Every 12 hours 05/07/13 0739 05/10/13 2216   05/06/13 2300  metroNIDAZOLE (FLAGYL) IVPB 500 mg  Status:  Discontinued     500 mg 100 mL/hr over 60 Minutes Intravenous Every 8 hours 05/06/13 2259 05/11/13 1018   05/06/13 2300  ceFEPIme (MAXIPIME) 1 g in dextrose 5 % 50 mL IVPB  Status:  Discontinued     1 g 100 mL/hr over 30 Minutes Intravenous 3 times per day 05/06/13 2259 05/10/13 1538   05/06/13 2045  vancomycin (VANCOCIN) IVPB 1000 mg/200 mL premix     1,000 mg 200 mL/hr over 60 Minutes Intravenous  Once 05/06/13 2033 05/07/13 0054   05/06/13 2045  ceFEPIme (MAXIPIME) 1 g in dextrose 5 % 50 mL IVPB     1 g 100 mL/hr over 30 Minutes Intravenous  Once 05/06/13 2033 05/06/13 2158   05/06/13 2045  metroNIDAZOLE  (FLAGYL) IVPB 500 mg  Status:  Discontinued     500 mg 100 mL/hr over 60 Minutes Intravenous  Once 05/06/13 2033 05/07/13 0738   05/06/13  2000  levofloxacin (LEVAQUIN) IVPB 750 mg  Status:  Discontinued     750 mg 100 mL/hr over 90 Minutes Intravenous Every 24 hours 05/06/13 1955 05/06/13 2010          Subjective:   [employer] today has, No headache, No chest pain, No abdominal pain - No Nausea, No new weakness tingling or numbness, No Cough - SOB.    Objective:   Filed Vitals:   05/15/13 1345 05/15/13 2104 05/16/13 0528 05/16/13 0728  BP: 116/58 116/52 131/69   Pulse: 107 119 103   Temp: 98.1 F (36.7 C) 97.9 F (36.6 C) 98 F (36.7 C)   TempSrc: Oral Oral Oral   Resp: 19 18 18    Height:      Weight:      SpO2: 95% 97% 96% 94%    Wt Readings from Last 3 Encounters:  05/14/13 88.8 kg (195 lb 12.3 oz)  05/14/13 88.8 kg (195 lb 12.3 oz)  04/29/13 83.4 kg (183 lb 13.8 oz)     Intake/Output Summary (Last 24 hours) at 05/16/13 1302 Last data filed at 05/16/13 0836  Gross per 24 hour  Intake   1080 ml  Output   1152 ml  Net    -72 ml    Exam Awake Alert, Oriented X 3, No new F.N deficits, Normal affect [place].AT,PERRAL Supple Neck,No JVD, No cervical lymphadenopathy appriciated.  Symmetrical Chest wall movement, Good air movement bilaterally, CTAB RRR,No Gallops,Rubs or new Murmurs, No Parasternal Heave +ve B.Sounds, Abd Soft, Non tender, No organomegaly appriciated, No rebound - guarding or rigidity. No Cyanosis, Clubbing or edema, No new Rash or bruise      Data Review   Micro Results Recent Results (from the past 240 hour(s))  CULTURE, BLOOD (ROUTINE X 2)     Status: None   Collection Time    05/06/13 11:38 PM      Result Value Range Status   Specimen Description BLOOD RIGHT HAND   Final   Special Requests BOTTLES DRAWN AEROBIC AND ANAEROBIC 5CC   Final   Culture  Setup Time     Final   Value: 05/09/2013 00:11     Performed at [facility]    Culture     Final   Value: STREPTOCOCCUS GROUP C     Note: GROWTH INSUFFICIENT FOR SENSITIVITIES     Note: Gram Stain Report Called to,Read Back By and Verified With: J BRODY AT [facility] 1707 05/08/13 BY S VANHOORNE Performed at [facility]     Performed at [facility]   Report Status 05/15/2013 FINAL   Final  CULTURE, BLOOD (ROUTINE X 2)     Status: None   Collection Time    05/06/13 11:47 PM      Result Value Range Status   Specimen Description BLOOD RIGHT ANTECUBITAL   Final   Special Requests BOTTLES DRAWN AEROBIC AND ANAEROBIC 8CC   Final   Culture NO GROWTH 5 DAYS   Final   Report Status 05/12/2013 FINAL   Final  URINE CULTURE     Status: None   Collection Time    05/07/13  2:07 AM      Result Value Range Status   Specimen Description URINE, CLEAN CATCH   Final   Special Requests NONE   Final   Culture  Setup Time     Final   Value: 05/07/2013 03:40     Performed at [facility]  Final   Value: Multiple bacterial morphotypes present, none predominant. Suggest appropriate recollection if clinically indicated.     Performed at [facility]   Report Status 05/08/2013 FINAL   Final  CULTURE, BLOOD (ROUTINE X 2)     Status: None   Collection Time    05/08/13  6:46 PM      Result Value Range Status   Specimen Description BLOOD RIGHT ARM   Final   Special Requests BOTTLES DRAWN AEROBIC AND ANAEROBIC 10CC   Final   Culture  Setup Time     Final   Value: 05/09/2013 01:08     Performed at [facility]   Culture     Final   Value: NO GROWTH 5 DAYS     Performed at [facility]   Report Status 05/15/2013 FINAL   Final  CULTURE, BLOOD (ROUTINE X 2)     Status: None   Collection Time    05/08/13  6:54 PM      Result Value Range Status   Specimen Description BLOOD RIGHT HAND   Final   Special Requests BOTTLES DRAWN AEROBIC ONLY 4CC   Final   Culture  Setup Time     Final   Value: 05/09/2013 01:12     Performed at [facility]   Culture     Final   Value: NO GROWTH 5 DAYS     Performed at [facility]   Report Status 05/15/2013 FINAL   Final  FUNGUS CULTURE W SMEAR     Status: None   Collection Time    05/09/13  9:20 AM      Result Value Range Status   Specimen Description PLEURAL FLUID RIGHT   Final   Special Requests PT ON VANCO FLAGYL MAXIPIME   Final   Fungal Smear     Final   Value: NO YEAST OR FUNGAL ELEMENTS SEEN     Performed at [facility]   Culture     Final   Value: CULTURE IN PROGRESS FOR FOUR WEEKS     Performed at [facility]   Report Status PENDING   Incomplete  AFB CULTURE WITH SMEAR     Status: None   Collection Time    05/09/13  9:20 AM      Result Value Range Status   Specimen Description PLEURAL FLUID RIGHT   Final   Special Requests PT ON VANCO FLAGYL MAXIPIME   Final   ACID FAST SMEAR     Final   Value: NO ACID FAST BACILLI SEEN     Performed at [facility]   Culture     Final   Value: CULTURE WILL BE EXAMINED FOR 6 WEEKS BEFORE ISSUING A FINAL REPORT     Performed at [facility]   Report Status PENDING   Incomplete  BODY FLUID CULTURE     Status: None   Collection Time    05/09/13  9:20 AM      Result Value Range Status   Specimen Description PLEURAL FLUID RIGHT   Final   Special Requests PT ON VANCO FLAGYL MAXIPIME   Final   Gram Stain     Final   Value: ABUNDANT WBC PRESENT,BOTH PMN AND MONONUCLEAR     ABUNDANT GRAM POSITIVE COCCI IN CLUSTERS     Gram Stain Report Called to,Read Back By and Verified With: Gram Stain Report Called to,Read Back By and Verified With:  PUJA  PATEL @0747  ON 05/10/13 BY SMIAS     Performed at [facility]   Culture     Final   Value: ABUNDANT GROUP B STREP(S.AGALACTIAE)ISOLATED     Note: This organism DOES NOT demonstrate inducible Clindamycin resistance in vitro.     Performed at [facility]   Report Status 05/15/2013 FINAL   Final   Organism ID, Bacteria GROUP B  STREP(S.AGALACTIAE)ISOLATED   Final  FUNGUS CULTURE W SMEAR     Status: None   Collection Time    05/09/13  9:26 AM      Result Value Range Status   Specimen Description PLEURAL FLUID RIGHT   Final   Special Requests PT ON VANCO FLAGYL MAXIPIME   Final   Fungal Smear     Final   Value: NO YEAST OR FUNGAL ELEMENTS SEEN     Performed at [facility]   Culture     Final   Value: CULTURE IN PROGRESS FOR FOUR WEEKS     Performed at [facility]   Report Status PENDING   Incomplete  AFB CULTURE WITH SMEAR     Status: None   Collection Time    05/09/13  9:26 AM      Result Value Range Status   Specimen Description PLEURAL FLUID RIGHT   Final   Special Requests PT ON VANCO FLAGYL MAXIPIME   Final   ACID FAST SMEAR     Final   Value: NO ACID FAST BACILLI SEEN     Performed at [facility]   Culture     Final   Value: CULTURE WILL BE EXAMINED FOR 6 WEEKS BEFORE ISSUING A FINAL REPORT     Performed at [facility]   Report Status PENDING   Incomplete  BODY FLUID CULTURE     Status: None   Collection Time    05/09/13  9:26 AM      Result Value Range Status   Specimen Description PLEURAL FLUID RIGHT   Final   Special Requests PT ON VANCO FLAGYL MAXIPIME   Final   Gram Stain     Final   Value: RARE WBC PRESENT, PREDOMINANTLY PMN     NO ORGANISMS SEEN     Performed at [facility]   Culture     Final   Value: RARE GROUP B STREP(S.AGALACTIAE)ISOLATED     Note: SUSCEPTIBILITIES PERFORMED ON PREVIOUS CULTURE WITHIN THE LAST 5 DAYS.     Performed at [facility]   Report Status 05/15/2013 FINAL   Final  AFB CULTURE WITH SMEAR     Status: None   Collection Time    05/09/13  9:39 AM      Result Value Range Status   Specimen Description TISSUE PLEURAL   Final   Special Requests RIGHT PLEURAL PEEL PT ON VANCO FLAGYL MAXIPIME   Final   ACID FAST SMEAR     Final   Value: NO ACID FAST BACILLI SEEN     Performed at [facility]   Culture      Final   Value: CULTURE WILL BE EXAMINED FOR 6 WEEKS BEFORE ISSUING A FINAL REPORT     Performed at [facility]   Report Status PENDING   Incomplete  FUNGUS CULTURE W SMEAR     Status: None   Collection Time    05/09/13  9:39 AM      Result Value Range Status   Specimen Description  TISSUE PLEURAL   Final   Special Requests RIGHT PLEURAL PEEL PT ON VANCO FLAGYL MAXIPIME   Final   Fungal Smear     Final   Value: NO YEAST OR FUNGAL ELEMENTS SEEN     Performed at [facility]   Culture     Final   Value: CULTURE IN PROGRESS FOR FOUR WEEKS     Performed at [facility]   Report Status PENDING   Incomplete  TISSUE CULTURE     Status: None   Collection Time    05/09/13  9:39 AM      Result Value Range Status   Specimen Description TISSUE PLEURAL   Final   Special Requests RIGHT PLEURAL PEEL PT ON VANCO FLAGYL MAXIPIME   Final   Gram Stain     Final   Value: RARE WBC PRESENT,BOTH PMN AND MONONUCLEAR     NO ORGANISMS SEEN     Performed at [facility]   Culture     Final   Value: RARE GROUP B STREP(S.AGALACTIAE)ISOLATED     Note: TESTING AGAINST S. AGALACTIAE NOT ROUTINELY PERFORMED DUE TO PREDICTABILITY OF AMP/PEN/VAN SUSCEPTIBILITY.     Performed at [facility]   Report Status 05/13/2013 FINAL   Final  ANAEROBIC CULTURE     Status: None   Collection Time    05/09/13  9:39 AM      Result Value Range Status   Specimen Description TISSUE PLEURAL   Final   Special Requests RIGHT PLEURAL PEEL PT ON VANCO FLAYGYL MAXIPIME   Final   Gram Stain     Final   Value: RARE WBC PRESENT,BOTH PMN AND MONONUCLEAR     NO ORGANISMS SEEN     Performed at [facility]   Culture     Final   Value: NO ANAEROBES ISOLATED     Performed at [facility]   Report Status 05/14/2013 FINAL   Final  AFB CULTURE WITH SMEAR     Status: None   Collection Time    05/09/13  9:52 AM      Result Value Range Status   Specimen Description TISSUE PLEURAL    Final   Special Requests PARIETAL PLEURAL PEEL PT ON VANCO FLAGYL MAXIPIME   Final   ACID FAST SMEAR     Final   Value: NO ACID FAST BACILLI SEEN     Performed at [facility]   Culture     Final   Value: CULTURE WILL BE EXAMINED FOR 6 WEEKS BEFORE ISSUING A FINAL REPORT     Performed at [facility]   Report Status PENDING   Incomplete  FUNGUS CULTURE W SMEAR     Status: None   Collection Time    05/09/13  9:52 AM      Result Value Range Status   Specimen Description TISSUE PLEURAL   Final   Special Requests PARIETAL PLEURAL PEEL PT ON VANCO FLAGYL MAXIPIME   Final   Fungal Smear     Final   Value: NO YEAST OR FUNGAL ELEMENTS SEEN     Performed at [facility]   Culture     Final   Value: CULTURE IN PROGRESS FOR FOUR WEEKS     Performed at [facility]   Report Status PENDING   Incomplete  ANAEROBIC CULTURE     Status: None   Collection Time    05/09/13  9:52 AM  Result Value Range Status   Specimen Description TISSUE PLEURAL   Final   Special Requests PARIETAL PLEURAL PEEL PT ON VANCO FLAGYL MAXIPIME   Final   Gram Stain     Final   Value: RARE WBC PRESENT,BOTH PMN AND MONONUCLEAR     NO ORGANISMS SEEN     Performed at [facility]   Culture     Final   Value: NO ANAEROBES ISOLATED     Performed at [facility]   Report Status 05/14/2013 FINAL   Final  TISSUE CULTURE     Status: None   Collection Time    05/09/13  9:52 AM      Result Value Range Status   Specimen Description TISSUE PLEURAL   Final   Special Requests PARIETAL PLEURAL PEEL PT ON VANCO FLAGYL MAXIPIME   Final   Gram Stain     Final   Value: RARE WBC PRESENT,BOTH PMN AND MONONUCLEAR     NO ORGANISMS SEEN     Performed at [facility]   Culture     Final   Value: MODERATE GROUP B STREP(S.AGALACTIAE)ISOLATED     Note: TESTING AGAINST S. AGALACTIAE NOT ROUTINELY PERFORMED DUE TO PREDICTABILITY OF AMP/PEN/VAN SUSCEPTIBILITY.     Performed at  [facility]   Report Status 05/13/2013 FINAL   Final  AFB CULTURE WITH SMEAR     Status: None   Collection Time    05/09/13 11:06 AM      Result Value Range Status   Specimen Description TISSUE LUNG RIGHT   Final   Special Requests RIGHT LOWER LOBE PT ON VANCOMYCIN,FLAGYL,MAXIPIME   Final   ACID FAST SMEAR     Final   Value: NO ACID FAST BACILLI SEEN     Performed at [facility]   Culture     Final   Value: CULTURE WILL BE EXAMINED FOR 6 WEEKS BEFORE ISSUING A FINAL REPORT     Performed at [facility]   Report Status PENDING   Incomplete  FUNGUS CULTURE W SMEAR     Status: None   Collection Time    05/09/13 11:06 AM      Result Value Range Status   Specimen Description TISSUE LUNG RIGHT   Final   Special Requests RIGHT LOWER LOBE PT ON VANCOMYCIN,FLAGYL,MAXIPIME   Final   Fungal Smear     Final   Value: NO YEAST OR FUNGAL ELEMENTS SEEN     Performed at [facility]   Culture     Final   Value: CULTURE IN PROGRESS FOR FOUR WEEKS     Performed at [facility]   Report Status PENDING   Incomplete  ANAEROBIC CULTURE     Status: None   Collection Time    05/09/13 11:06 AM      Result Value Range Status   Specimen Description TISSUE LUNG RIGHT   Final   Special Requests RIGHT LOWER LOBE PT ON VANCOMYCIN,FLAGYL,MAXIPIME   Final   Gram Stain     Final   Value: RARE WBC PRESENT,BOTH PMN AND MONONUCLEAR     NO ORGANISMS SEEN     Performed at [facility]   Culture     Final   Value: NO ANAEROBES ISOLATED     Performed at [facility]   Report Status 05/14/2013 FINAL   Final  TISSUE CULTURE     Status: None   Collection Time  05/09/13 11:06 AM      Result Value Range Status   Specimen Description TISSUE LUNG RIGHT   Final   Special Requests RIGHT LOWER LOBE PT ON VANCOMYCIN,FLAGYL,MAXIPIME   Final   Gram Stain     Final   Value: RARE WBC PRESENT,BOTH PMN AND MONONUCLEAR     NO ORGANISMS SEEN     Performed at [facility]   Culture     Final   Value: RARE GROUP B STREP(S.AGALACTIAE)ISOLATED     Note: TESTING AGAINST S. AGALACTIAE NOT ROUTINELY PERFORMED DUE TO PREDICTABILITY OF AMP/PEN/VAN SUSCEPTIBILITY.     Performed at [facility]   Report Status 05/13/2013 FINAL   Final    Radiology Reports Dg Chest 2 View  05/15/2013   CLINICAL DATA:  Short of breath  EXAM: CHEST  2 VIEW  COMPARISON:  Yesterday  FINDINGS: Diffuse interstitial infiltrates worse throughout the right lung are stable. Bilateral pleural effusions stable. Mild cardiomegaly unchanged. No pneumothorax. Right internal jugular central venous catheter stable.  IMPRESSION: Bilateral effusions and interstitial edema are stable.   Electronically Signed   By: Maryclare Bean M.D.   On: 05/15/2013 07:43   Dg Chest 2 View  04/29/2013   CLINICAL DATA:  Fever.  Shortness of breath.  EXAM: CHEST  2 VIEW  COMPARISON:  01/25/2010  FINDINGS: Heart size is within normal limits. Chronic central peribronchial thickening and interstitial prominence is unchanged. No evidence of acute or superimposed infiltrate. No evidence of pleural effusion. No mass or lymphadenopathy identified.  IMPRESSION: Stable exam. No active disease.   Electronically Signed   By: Myles Rosenthal M.D.   On: 04/29/2013 17:43   Dg Lumbar Spine Complete  05/06/2013   CLINICAL DATA:  Chronic low back pain  EXAM: LUMBAR SPINE - COMPLETE 4+ VIEW  COMPARISON:  Prior CT from 04/29/2013  FINDINGS: Overall alignment is stable as compared to the prior examination with trace retrolisthesis of L3 on L4. Sequelae of prior vertebral augmentation are again seen at L3 and L4, unchanged. Vertebral body height loss T11, T12, L3, and L4 levels is not significantly changed. Multilevel degenerative disc disease and facet arthrosis is grossly similar, most severe at L2-3. No acute fracture listhesis. Diffuse osteopenia is present.  Paravertebral soft tissues are within normal limits. Prominent atherosclerotic  calcifications noted within the infrarenal aorta.  IMPRESSION: 1. No acute osseous abnormality identified within the lumbar spine. 2. Stable appearance of the lumbar spine with prior vertebral augmentation at L3 and L4 with multilevel compression deformities and degenerative disc disease. .   Electronically Signed   By: Rise Mu M.D.   On: 05/06/2013 22:44   Ct Chest W Contrast  05/07/2013   CLINICAL DATA:  Patient with a loculated effusion. She was to have a thoracentesis today he, but the initial ultrasound scan suggests the that the fluid may at least in part be within consolidated lung. The right effusion was clearly and not free flowing. The patient's prior CT demonstrated essentially clear lungs, performed 8 days previously.  EXAM: CT CHEST WITH CONTRAST  TECHNIQUE: Multidetector CT imaging of the chest was performed during intravenous contrast administration.  CONTRAST:  80mL OMNIPAQUE IOHEXOL 300 MG/ML  SOLN  COMPARISON:  Current chest radiograph, 05/06/2013. Prior chest CT, 04/29/2013.  FINDINGS: There is loculated pleural fluid above the right hemidiaphragm along the right posterior lateral hemithorax extending to the level of the carina with minimal loculated pleural fluid noted in the right lateral upper hemithorax.  The small mild loculated pleural fluid extends along the azygoesophageal recess. There is intervening collapsed/consolidated lung. Within the right posterior lateral lower hemi thorax pleural fluid there are bubbles of air.  On the left there is a small posterior pleural effusion with mild associated subsegmental dependent atelectasis.  There are few minor areas of reticular and focal ground-glass opacity in the upper lobes. There is no pulmonary edema.  There are prominent mediastinal lymph nodes. A right peritracheal node measures 11 mm in short axis. Above this there is an 8 mm node. There is soft tissue above the right pulmonary artery which appears to be a prominent  azygos arch. The heart is normal in size. There is minimal pericardial fluid. The great vessels are normal in caliber.  A 15 mm low-density nodule projects along the posterior margin of the left thyroid lobe.  Limited evaluation of the upper abdomen shows fatty infiltration of the liver, borderline hepatomegaly and changes from a cholecystectomy.  There are degenerative changes of the visualized spine. No osteoblastic or osteolytic lesions.  IMPRESSION: 1. Loculated right pleural effusion with associated atelectatic/ consolidated lung. This has a complex appearance. A few small bubbles of air within the presumed pleural fluid these is suspicion for a bronchopleural fistula. There is a small left pleural effusion with minor dependent subsegmental atelectasis. 2. Thoracentesis to obtain diagnostic fluid can be performed. Risks from this procedure are greater than for a typical free-flowing pleural effusion. Recommend consideration of placement of a pigtail drainage catheter within the largest loculation of pleural fluid to allow diagnostic acquisition of fluid and persistent drainage.   Electronically Signed   By: Amie Portland M.D.   On: 05/07/2013 15:58   Ct Angio Chest Pe W/cm &/or Wo Cm  04/29/2013   CLINICAL DATA:  Right abdominal pain with fever and chills for 4 days. Recently diagnosed with chikungunya disease.  EXAM: CT ANGIOGRAPHY CHEST  CT ABDOMEN AND PELVIS WITH CONTRAST  TECHNIQUE: Multidetector CT imaging of the chest was performed using the standard protocol during bolus administration of intravenous contrast. Multiplanar CT image reconstructions including MIPs were obtained to evaluate the vascular anatomy. Multidetector CT imaging of the abdomen and pelvis was performed using the standard protocol during bolus administration of intravenous contrast.  CONTRAST:  OMNIPAQUE IOHEXOL 350 MG/ML SOLN  COMPARISON:  None.  FINDINGS: CTA CHEST FINDINGS  There is satisfactory but not optimal  opacification of the pulmonary arteries. No pulmonary emboli are demonstrated. There is mild atherosclerosis of the aorta and great vessels.  Small subcarinal and right hilar lymph nodes are noted, not pathologically enlarged. There is a small hiatal hernia. There is a small amount of pleural thickening on the right. No significant pleural or pericardial effusion is present. There is a probable sebaceous cyst anterior to the lower sternum.  Mild pulmonary emphysematous changes are noted. There is mild atelectasis or scarring in the right lower lobe. No confluent airspace opacity or endobronchial lesion is present.  CT ABDOMEN and PELVIS FINDINGS  There is mild extrahepatic biliary dilatation status post cholecystectomy. The liver and pancreas appear normal. The spleen and adrenal glands appear normal. The right kidney has a horizontal lie, but no focal cortical abnormality. There is no hydronephrosis.  There is aortoiliac atherosclerosis. No enlarged abdominal pelvic lymph nodes are seen. The subacute small bowel appear normal aside from a lipoma within the 3rd portion of the duodenum. The appendix appears normal. Sigmoid colon diverticular changes are present without surrounding inflammation.  There is  no pelvic mass status post hysterectomy. The bladder appears normal. There is a suprapubic ventral hernia containing fat.  Compression deformities at L3 and L4 are unchanged from a prior lumbar spine radiographs. No acute osseous findings are seen.  Review of the MIP images confirms the above findings.  IMPRESSION: 1. No evidence of acute pulmonary embolism. 2. No acute chest findings identified. There is mild right basilar scarring or atelectasis with pleural thickening. No significant pleural effusion. 3. No acute abdominal pelvic findings. 4. Suprapubic ventral hernia containing fat, atherosclerosis, chronic lumbar compression deformities and sigmoid colon diverticulosis are noted.   Electronically Signed   By:  Roxy Horseman M.D.   On: 04/29/2013 17:40   [place] Chest  05/07/2013   CLINICAL DATA:  Patient presents for ultrasound-guided thoracentesis of the right chest for a new area of loculated pleural fluid on the right.  EXAM: CHEST ULTRASOUND  COMPARISON:  Chest radiograph, 05/06/2013  FINDINGS: There is a loculated area pleural fluid at the right lung base that is mostly surrounded by lung that has internal echoes consistent with consolidated lung. It is unclear whether this fluid resides within the lung, within the visceral pleura, or whether this is a loculated pleural effusion, or combination of both. For this reason, thoracentesis was not performed. The patient 1 ago a contrast enhanced chest CT for further delineation of this collection.  A small left pleural effusion was incidentally noted.  IMPRESSION: Loculated right pleural fluid versus an area of necrosis in consolidated right lung or combination of both.   Electronically Signed   By: Amie Portland M.D.   On: 05/07/2013 13:37   Ct Abdomen Pelvis W Contrast  04/29/2013   CLINICAL DATA:  Right abdominal pain with fever and chills for 4 days. Recently diagnosed with chikungunya disease.  EXAM: CT ANGIOGRAPHY CHEST  CT ABDOMEN AND PELVIS WITH CONTRAST  TECHNIQUE: Multidetector CT imaging of the chest was performed using the standard protocol during bolus administration of intravenous contrast. Multiplanar CT image reconstructions including MIPs were obtained to evaluate the vascular anatomy. Multidetector CT imaging of the abdomen and pelvis was performed using the standard protocol during bolus administration of intravenous contrast.  CONTRAST:  OMNIPAQUE IOHEXOL 350 MG/ML SOLN  COMPARISON:  None.  FINDINGS: CTA CHEST FINDINGS  There is satisfactory but not optimal opacification of the pulmonary arteries. No pulmonary emboli are demonstrated. There is mild atherosclerosis of the aorta and great vessels.  Small subcarinal and right hilar lymph nodes are  noted, not pathologically enlarged. There is a small hiatal hernia. There is a small amount of pleural thickening on the right. No significant pleural or pericardial effusion is present. There is a probable sebaceous cyst anterior to the lower sternum.  Mild pulmonary emphysematous changes are noted. There is mild atelectasis or scarring in the right lower lobe. No confluent airspace opacity or endobronchial lesion is present.  CT ABDOMEN and PELVIS FINDINGS  There is mild extrahepatic biliary dilatation status post cholecystectomy. The liver and pancreas appear normal. The spleen and adrenal glands appear normal. The right kidney has a horizontal lie, but no focal cortical abnormality. There is no hydronephrosis.  There is aortoiliac atherosclerosis. No enlarged abdominal pelvic lymph nodes are seen. The subacute small bowel appear normal aside from a lipoma within the 3rd portion of the duodenum. The appendix appears normal. Sigmoid colon diverticular changes are present without surrounding inflammation.  There is no pelvic mass status post hysterectomy. The bladder appears normal. There is a  suprapubic ventral hernia containing fat.  Compression deformities at L3 and L4 are unchanged from a prior lumbar spine radiographs. No acute osseous findings are seen.  Review of the MIP images confirms the above findings.  IMPRESSION: 1. No evidence of acute pulmonary embolism. 2. No acute chest findings identified. There is mild right basilar scarring or atelectasis with pleural thickening. No significant pleural effusion. 3. No acute abdominal pelvic findings. 4. Suprapubic ventral hernia containing fat, atherosclerosis, chronic lumbar compression deformities and sigmoid colon diverticulosis are noted.   Electronically Signed   By: Roxy Horseman M.D.   On: 04/29/2013 17:40   Dg Chest Port 1 View  05/14/2013   CLINICAL DATA:  Status post video-assisted thoracoscopy and right chest decortication.  EXAM: PORTABLE CHEST -  1 VIEW  COMPARISON:  05/13/2013  FINDINGS: Irregularly thickened interstitial markings, greater on the right, are without significant change. Blunting of the right costophrenic sulcus likely from pleural fluid versus pleural thickening is stable. Small bilateral pleural effusions are noted, also stable. No new lung opacities. No pneumothorax.  Right internal jugular central venous line is stable in well positioned.  IMPRESSION: 1. No change from the previous day's study.   Electronically Signed   By: Amie Portland M.D.   On: 05/14/2013 07:32   Dg Chest Port 1 View  05/13/2013   CLINICAL DATA:  Empyema.  EXAM: PORTABLE CHEST - 1 VIEW  COMPARISON:  05/12/2013  FINDINGS: Status post removal of the remaining right chest tube. No pneumothorax.  Vascular congestion has improved. Persistent lung base opacity is noted likely combination of pleural effusions and atelectasis.  No pneumothorax. Right internal jugular central venous line is stable.  IMPRESSION: 1. Removal of the remaining right chest tube.  No pneumothorax. 2. Improved vascular congestion.   Electronically Signed   By: Amie Portland M.D.   On: 05/13/2013 08:10   Dg Chest Port 1 View  05/12/2013   CLINICAL DATA:  Decortication.  EXAM: PORTABLE CHEST - 1 VIEW  COMPARISON:  One-view chest 05/11/2013.  CT chest 05/07/2013.  FINDINGS: The heart size is exaggerated by low lung volumes. 1 of the right-sided chest tubes has been removed. There is no pneumothorax. Right-sided pleural scarring is stable. A right IJ line is stable in position. A left pleural effusion is stable. Bibasilar airspace disease remains. This likely reflects atelectasis. There is no new airspace disease. Degenerative changes are noted at the shoulders.  IMPRESSION: 1. Interval removal of 1 of the right-sided chest tube without evidence for pneumothorax. 2. The other chest tube is stable.  The right IJ line is stable. 3. Stable pulmonary vascular congestion and left pleural effusion. 4.  Stable pleural thickening and scarring on the right.   Electronically Signed   By: Gennette Pac M.D.   On: 05/12/2013 07:15   Dg Chest Port 1 View  05/11/2013   CLINICAL DATA:  Status post VATS procedure  EXAM: PORTABLE CHEST - 1 VIEW  COMPARISON:  05/10/2013  FINDINGS: Two thoracostomy catheters are again noted on the right. No pneumothorax is noted. Interstitial changes are again noted within the right lung. A right central venous line is again seen and stable. Small effusion is noted on the left stable from the prior exam. The cardiac shadow is unchanged.  IMPRESSION: When compare with the prior exam no significant interval change is noted.   Electronically Signed   By: Alcide Clever M.D.   On: 05/11/2013 07:28   Dg Chest [facility] [address]  05/10/2013   CLINICAL DATA:  Status post VATS and wedge resection of right lower lobe. Right-sided pleural effusion/empyema.  EXAM: PORTABLE CHEST - 1 VIEW  COMPARISON:  One day prior  FINDINGS: Right IJ central line unchanged. Two right-sided chest tubes are unchanged in position. No pneumothorax.  Cardiomegaly with atherosclerosis in the transverse aorta. Probable small bilateral pleural effusions, as before.  Interstitial edema, accentuated by extremely low lung volumes. Greater on the right than left.  Improved inferolateral right base airspace disease with persistent left base atelectasis.  IMPRESSION: Minimal improvement in right base aeration.  Otherwise, similar appearance of the chest with pleural effusions, atelectasis, and interstitial edema.  Two right-sided chest tubes remain in place; no pneumothorax.   Electronically Signed   By: Jeronimo Greaves M.D.   On: 05/10/2013 07:34   Dg Chest Portable 1 View  05/09/2013   CLINICAL DATA:  Status post thoracoscopy with wedge resection of the right lower lobe.  EXAM: PORTABLE CHEST - 1 VIEW  COMPARISON:  April 29, 2013.  FINDINGS: The lungs are reasonably well inflated. There are 2 chest tubes in place on the right  with the tips near the pulmonary apex. There is no evidence of a pneumothorax. There is consolidation of portions of the right mid and lower lung. There is no mediastinal shift. The right internal jugular venous catheter tip lies in the region of the proximal to mid SVC. The cardiac silhouette is mildly enlarged. The pulmonary vascularity is mildly prominent bilaterally. There is partial obscure a shin of the left hemidiaphragm which may reflect minimal subsegmental atelectasis.  IMPRESSION: 1. The patient is status post right-sided thoracoscopy with right lower lobe wedge resection. Two chest tubes are in place with no evidence of a significant pneumothorax or pleural effusion. There is parenchymal consolidation in the right mid to lower lung. 2. The appearance of the heart and pulmonary vascularity suggesting low-grade CHF. 3. These results will be called to the ordering clinician or representative by the Radiologist Assistant, and communication documented in the PACS Dashboard.   Electronically Signed   By: David  [place]   On: 05/09/2013 14:37   Dg Abd Acute W/chest  05/06/2013   CLINICAL DATA:  Chronic abdominal pain.  EXAM: ACUTE ABDOMEN SERIES (ABDOMEN 2 VIEW & CHEST 1 VIEW)  COMPARISON:  04/29/2013.  FINDINGS: The cardiac silhouette, mediastinal and hilar contours are within normal limits and stable There is a new probable loculated pleural fluid collection on the right and a right lung infiltrate. This is unlikely a mass given the recent normal Chest x-ray and chest CT. The left lung is clear. The bony thorax is intact.  The abdominal radiographs demonstrate an unremarkable bowel gas pattern. There is scattered contrast throughout the colon. The soft tissue shadows are maintained. The bony structures are intact. Stable vertebral augmentation changes at L3 and L4 new large probable loculated right pleural effusion and right lung infiltrate.  IMPRESSION: New probable loculated right pleural fluid collection  and right lung infiltrate.  No acute abdominal findings. Contrast noted throughout the colon.   Electronically Signed   By: Loralie Champagne M.D.   On: 05/06/2013 19:46    CBC  Recent Labs Lab 05/12/13 0413 05/13/13 0419 05/13/13 1625 05/14/13 0400 05/15/13 0612  WBC 16.8* 16.9* 18.5* 18.2* 17.7*  HGB 7.9* 7.1* 7.7* 7.1* 7.6*  HCT 22.6* 21.0* 22.8* 21.2* 23.0*  PLT 429* 472* 590* 595* 739*  MCV 88.6 89.7 89.8 90.6 90.9  MCH 31.0 30.3 30.3 30.3  30.0  MCHC 35.0 33.8 33.8 33.5 33.0  RDW 13.9 13.9 14.0 14.4 14.7    Chemistries   Recent Labs Lab 05/11/13 0420 05/12/13 0413 05/13/13 0419 05/13/13 1625 05/14/13 0400 05/15/13 0612  NA 134* 131* 132* 129* 131* 132*  K 3.3* 3.1* 3.0* 3.7 3.7 3.7  CL 102 99 98 95* 98 97  CO2 20 21 23 24 25 26   GLUCOSE 159* 95 150* 239* 126* 194*  BUN 23 19 17 16 15 13   CREATININE 1.15* 1.17* 1.12* 1.16* 1.07 1.10  CALCIUM 7.6* 7.6* 7.3* 7.4* 7.7* 7.9*  MG  --   --  1.5 2.0  --   --   AST 12 14  --   --   --   --   ALT 8 8  --   --   --   --   ALKPHOS 113 138*  --   --   --   --   BILITOT 0.4 0.4  --   --   --   --    ------------------------------------------------------------------------------------------------------------------ estimated creatinine clearance is 62.1 ml/min (by C-G formula based on Cr of 1.1). ------------------------------------------------------------------------------------------------------------------ No results found for this basename: HGBA1C,  in the last 72 hours ------------------------------------------------------------------------------------------------------------------ No results found for this basename: CHOL, HDL, LDLCALC, TRIG, CHOLHDL, LDLDIRECT,  in the last 72 hours ------------------------------------------------------------------------------------------------------------------  Recent Labs  05/13/13 1930  TSH 3.129    ------------------------------------------------------------------------------------------------------------------ No results found for this basename: VITAMINB12, FOLATE, FERRITIN, TIBC, IRON, RETICCTPCT,  in the last 72 hours  Coagulation profile No results found for this basename: INR, PROTIME,  in the last 168 hours  No results found for this basename: DDIMER,  in the last 72 hours  Cardiac Enzymes  Recent Labs Lab 05/13/13 1625  TROPONINI <0.30   ------------------------------------------------------------------------------------------------------------------ No components found with this basename: POCBNP,      Time Spent in minutes   45   Susa Raring K M.D on 05/16/2013 at 1:02 PM  Between 7am to 7pm - Pager - [phone]  After 7pm go to [email or web address] - password [email or web address]  And look for the night coverage person covering for me after hours  [facility] Office  [phone]

## 2013-05-16 NOTE — Progress Notes (Signed)
Patient ambulated X's 2 today; patient was only able to ambulate 75 feet; patient is experiencing a lot of back pain preventing her from walking longer distances.  BARNETT, Geroge Baseman

## 2013-05-17 ENCOUNTER — Encounter (HOSPITAL_COMMUNITY)
Admission: EM | Disposition: A | Discharge status: Home / Self Care | Payer: Self-pay | Source: Home / Self Care | Attending: Critical Care Medicine

## 2013-05-17 LAB — GLUCOSE, CAPILLARY
Glucose-Capillary: 95 mg/dL (ref 70–99)
Glucose-Capillary: 67 mg/dL — ABNORMAL LOW (ref 70–99)
Glucose-Capillary: 182 mg/dL — ABNORMAL HIGH (ref 70–99)
Glucose-Capillary: 152 mg/dL — ABNORMAL HIGH (ref 70–99)

## 2013-05-17 LAB — CBC
HCT: 23 % — ABNORMAL LOW (ref 36.0–46.0)
Hemoglobin: 7.4 g/dL — ABNORMAL LOW (ref 12.0–15.0)
MCH: 30 pg (ref 26.0–34.0)
MCHC: 32.2 g/dL (ref 30.0–36.0)
MCV: 93.1 fL (ref 78.0–100.0)
Platelets: 743 10*3/uL — ABNORMAL HIGH (ref 150–400)
RBC: 2.47 MIL/uL — ABNORMAL LOW (ref 3.87–5.11)
RDW: 14.4 % (ref 11.5–15.5)
WBC: 12.6 10*3/uL — ABNORMAL HIGH (ref 4.0–10.5)

## 2013-05-17 LAB — BASIC METABOLIC PANEL
BUN: 12 mg/dL (ref 6–23)
CO2: 28 mEq/L (ref 19–32)
Calcium: 8.1 mg/dL — ABNORMAL LOW (ref 8.4–10.5)
Chloride: 98 mEq/L (ref 96–112)
Creatinine, Ser: 1.09 mg/dL (ref 0.50–1.10)
GFR calc Af Amer: 64 mL/min — ABNORMAL LOW (ref >90)
GFR calc non Af Amer: 55 mL/min — ABNORMAL LOW (ref >90)
Glucose, Bld: 137 mg/dL — ABNORMAL HIGH (ref 70–99)
Potassium: 3.8 mEq/L (ref 3.5–5.1)
Sodium: 135 mEq/L (ref 135–145)

## 2013-05-17 SURGERY — ECHOCARDIOGRAM, TRANSESOPHAGEAL
Anesthesia: Moderate Sedation

## 2013-05-17 MED ORDER — METOPROLOL SUCCINATE ER 25 MG PO TB24
25.0000 mg | ORAL_TABLET | ORAL | Status: AC
  Administered 2013-05-17: 25 mg via ORAL
  Filled 2013-05-17 (×2): qty 1

## 2013-05-17 MED ORDER — SODIUM CHLORIDE 0.9 % IJ SOLN
10.0000 mL | INTRAMUSCULAR | Status: DC | PRN
  Administered 2013-05-18: 10 mL

## 2013-05-17 MED ORDER — METOPROLOL SUCCINATE ER 50 MG PO TB24
50.0000 mg | ORAL_TABLET | Freq: Every day | ORAL | Status: DC
  Administered 2013-05-18: 50 mg via ORAL
  Filled 2013-05-17: qty 1

## 2013-05-17 MED ORDER — FUROSEMIDE 40 MG PO TABS
40.0000 mg | ORAL_TABLET | Freq: Once | ORAL | Status: AC
  Administered 2013-05-17: 40 mg via ORAL
  Filled 2013-05-17: qty 1

## 2013-05-17 MED ORDER — INSULIN DETEMIR 100 UNIT/ML ~~LOC~~ SOLN
30.0000 [IU] | Freq: Every day | SUBCUTANEOUS | Status: DC
  Administered 2013-05-17: 30 [IU] via SUBCUTANEOUS
  Filled 2013-05-17 (×2): qty 0.3

## 2013-05-17 NOTE — Progress Notes (Signed)
NUTRITION FOLLOW UP  Intervention:   No nutrition intervention at this time ---> patient declined RD to follow for nutrition care plan  Nutrition Dx:   Inadequate oral intake, improving  Goal:   Pt to meet >/= 90% of their estimated nutrition needs, progressing  Monitor:   PO intake, weight, labs, I/O's  Assessment:    Patient with PMH of DM, depression, chronic back pain and HTN; admitted to [facility] for dehydration and severe hyperglycemia; began feeling worse with fevers and right sided pleuritic chest pain -- CT of chest showed large loculated right para-pneumonic effusion/empyema.   Patient s/p procedures 11/12:  VIDEO ASSISTED THORACOSCOPY (VATS)  WEDGE RESECTION RIGHT LOWER LOBE (Right)  Patient's diet advanced to Carbohydrate Modified Medium Calorie 11/14.  Reports her appetite is getting better.  PO intake variable at 25-100% per flowsheet records.  RD offered nutritions supplements, however, patient declined.  Height: Ht Readings from Last 1 Encounters:  05/09/13 5\' 5"  (1.651 m)    Weight Status:   Wt Readings from Last 1 Encounters:  05/14/13 195 lb 12.3 oz (88.8 kg)    Re-estimated needs:  Kcal: 1900-2100 Protein: 105-115 gm Fluid: 1.9-2.0 L  Skin: surgical chest incision   Diet Order: Carb Control   Intake/Output Summary (Last 24 hours) at 05/17/13 1220 Last data filed at 05/17/13 0653  Gross per 24 hour  Intake    842 ml  Output    700 ml  Net    142 ml    Labs:   Recent Labs Lab 05/13/13 0419 05/13/13 1625 05/14/13 0400 05/15/13 0612 05/17/13 0503  NA 132* 129* 131* 132* 135  K 3.0* 3.7 3.7 3.7 3.8  CL 98 95* 98 97 98  CO2 23 24 25 26 28   BUN 17 16 15 13 12   CREATININE 1.12* 1.16* 1.07 1.10 1.09  CALCIUM 7.3* 7.4* 7.7* 7.9* 8.1*  MG 1.5 2.0  --   --   --   PHOS  --  2.6  --   --   --   GLUCOSE 150* 239* 126* 194* 137*    CBG (last 3)   Recent Labs  05/16/13 2059 05/17/13 0625 05/17/13 1121  GLUCAP 184* 95 67*    Scheduled  Meds: . bisacodyl  10 mg Oral Daily  . cefTRIAXone (ROCEPHIN)  IV  1 g Intravenous Q24H  . dextromethorphan-guaiFENesin  1 tablet Oral BID  . DULoxetine  60 mg Oral Daily  . enoxaparin (LOVENOX) injection  40 mg Subcutaneous Q24H  . insulin aspart  0-15 Units Subcutaneous TID WC  . insulin aspart  0-5 Units Subcutaneous QHS  . insulin detemir  30 Units Subcutaneous QHS  . levalbuterol  0.63 mg Nebulization BID  . [START ON 05/18/2013] metoprolol succinate  50 mg Oral Daily  . potassium chloride  40 mEq Oral Daily    Continuous Infusions: . sodium chloride 20 mL/hr at 05/16/13 1517    Maureen Chatters, RD, LDN Pager #: [phone] After-Hours Pager #: [phone]

## 2013-05-17 NOTE — Progress Notes (Signed)
Peripherally Inserted Central Catheter/Midline Placement  The IV Nurse has discussed with the patient and/or persons authorized to consent for the patient, the purpose of this procedure and the potential benefits and risks involved with this procedure.  The benefits include less needle sticks, lab draws from the catheter and patient may be discharged home with the catheter.  Risks include, but not limited to, infection, bleeding, blood clot (thrombus formation), and puncture of an artery; nerve damage and irregular heat beat.  Alternatives to this procedure were also discussed.  PICC/Midline Placement Documentation        Kelli Summers 05/17/2013, 3:47 PM

## 2013-05-17 NOTE — Progress Notes (Signed)
[facility]                                                                                Patient Demographics  Kelli Summers, is a 57 y.o. female, DOB - 03-Aug-1955, UEA:[id number]  Admit date - 05/06/2013   Admitting Physician Osvaldo Shipper, MD  Outpatient Primary MD for the patient is Colette Ribas, MD  LOS - 11   Chief Complaint  Patient presents with  . Hyperglycemia      Summary    57 yo female initially admitted to any [facility] with HCAP. Found to have loculated pleural effusion so transfer to [facility] for VATS procedure. Had encephalopathy postop, which has cleared. Pleural fluid grew group B strep. Blood cultures grew strep. Transferred from critical care service to try and hospitalist on 11/18.       Assessment & Plan     1. HTN with loculated pleural effusion second empyema, Streptococcus bacteremia and Streptococcus growing out of pleural fluid. Discussed case on phone with ID physician Dr. Algis Liming and Dr Ninetta Lights, continue her on IV Rocephin due to bacteremia, total duration at least 2-3 weeks then oral antibiotics for a few weeks. Per ID Dr. Ninetta Lights no need for TEE will get simple transthoracic echogram.      2. Type 2 diabetes mellitus. Was slightly reduced home Lantus dose , continue with sliding scale NovoLog, Glycemic control acceptable right now  Lab Results  Component Value Date   HGBA1C 14.9* 04/30/2013    CBG (last 3)   Recent Labs  05/16/13 2059 05/17/13 0625 05/17/13 1121  GLUCAP 184* 95 67*       3. Acute hypoxic respiratory failure with Toxic Encephalopathy both due to CAP and empyema.- Solved after that's procedure along with decortication by cardiothoracic surgery on 05/09/2013. Antibiotics as in #1 above     4. Depression treated on home dose Cymbalta.     5. Anemia secondary to blood loss and anemia of chronic disease. Stable H&H. No need for transfusion for now     6. Physical deconditioning. Much improved  with supportive care here.     7. Hypertension. Stable on beta blocker continued.     8. Leukocytosis reactionary due to #1 above, monitor with plan as #above.     9. Pleural effusions. Obtain echogram to rule out CHF.      Code Status: Full  Family Communication:    Disposition Plan: Home   Procedures   11/09 Presented to [facility] with malaise, fever, dyspnea and hyperglycemia  11/10 Chest CT: Loculated right pleural effusion with associated atelectatic/ consolidated lung. This has a complex appearance. A few small bubbles of air within the presumed pleural fluid these is suspicion for a bronchopleural fistula. There is a small left pleural effusion with minor dependent subsegmental atelectasis.  11/11 Transferred to [facility]  11/12 VATS / decortication  11/13 Agitated delirium. PCCM consult  11/14 One unit PRBCs for Hgb 6.9  11/14 VATS path neg for malignancy  11/15 Chest tube removed  11/16 walked  11/17 transferred to floor    LINES / TUBES:  L rad A-line 11/12 >> 11/14  R chest tubes 11/12 >>11/15  R IJ CVL 11/12 >>    CULTURES:  11/10 Urine >>> neg  11/09 Blood >> Strep  11/11 Blood >> ng  11/12 Pleural fluid >> group B strep       Consults  TCTS, PCCM   Medications  Scheduled Meds: . bisacodyl  10 mg Oral Daily  . cefTRIAXone (ROCEPHIN)  IV  1 g Intravenous Q24H  . dextromethorphan-guaiFENesin  1 tablet Oral BID  . DULoxetine  60 mg Oral Daily  . enoxaparin (LOVENOX) injection  40 mg Subcutaneous Q24H  . insulin aspart  0-15 Units Subcutaneous TID WC  . insulin aspart  0-5 Units Subcutaneous QHS  . insulin detemir  34 Units Subcutaneous QHS  . levalbuterol  0.63 mg Nebulization BID  . [START ON 05/18/2013] metoprolol succinate  50 mg Oral Daily  . potassium chloride  40 mEq Oral Daily   Continuous Infusions: . sodium chloride 20 mL/hr at 05/16/13 1517   PRN Meds:.acetaminophen, diazepam, levalbuterol, ondansetron (ZOFRAN) IV, ondansetron,  oxyCODONE-acetaminophen    DVT Prophylaxis  Lovenox    Lab Results  Component Value Date   PLT 743* 05/17/2013    Antibiotics     Anti-infectives   Start     Dose/Rate Route Frequency Ordered Stop   05/16/13 1300  cefTRIAXone (ROCEPHIN) 1 g in dextrose 5 % 50 mL IVPB     1 g 100 mL/hr over 30 Minutes Intravenous Every 24 hours 05/16/13 1228     05/15/13 1630  cefUROXime (CEFTIN) tablet 500 mg  Status:  Discontinued     500 mg Oral 2 times daily with meals 05/15/13 1130 05/16/13 1228   05/15/13 1130  cephALEXin (KEFLEX) capsule 500 mg  Status:  Discontinued     500 mg Oral Every 12 hours 05/15/13 1126 05/15/13 1130   05/11/13 1400  ceFAZolin (ANCEF) IVPB 1 g/50 mL premix  Status:  Discontinued     1 g 100 mL/hr over 30 Minutes Intravenous 3 times per day 05/11/13 1018 05/15/13 1121   05/11/13 1300  vancomycin (VANCOCIN) IVPB 1000 mg/200 mL premix  Status:  Discontinued     1,000 mg 200 mL/hr over 60 Minutes Intravenous Every 24 hours 05/11/13 1257 05/12/13 1151   05/10/13 1545  aztreonam (AZACTAM) 1 g in dextrose 5 % 50 mL IVPB  Status:  Discontinued     1 g 100 mL/hr over 30 Minutes Intravenous 3 times per day 05/10/13 1542 05/11/13 1018   05/07/13 1000  vancomycin (VANCOCIN) IVPB 1000 mg/200 mL premix  Status:  Discontinued     1,000 mg 200 mL/hr over 60 Minutes Intravenous Every 12 hours 05/07/13 0739 05/10/13 2216   05/06/13 2300  metroNIDAZOLE (FLAGYL) IVPB 500 mg  Status:  Discontinued     500 mg 100 mL/hr over 60 Minutes Intravenous Every 8 hours 05/06/13 2259 05/11/13 1018   05/06/13 2300  ceFEPIme (MAXIPIME) 1 g in dextrose 5 % 50 mL IVPB  Status:  Discontinued     1 g 100 mL/hr over 30 Minutes Intravenous 3 times per day 05/06/13 2259 05/10/13 1538   05/06/13 2045  vancomycin (VANCOCIN) IVPB 1000 mg/200 mL premix     1,000 mg 200 mL/hr over 60 Minutes Intravenous  Once 05/06/13 2033 05/07/13 0054   05/06/13 2045  ceFEPIme (MAXIPIME) 1 g in dextrose 5 % 50 mL IVPB      1 g 100 mL/hr over 30 Minutes Intravenous  Once 05/06/13 2033 05/06/13 2158   05/06/13 2045  metroNIDAZOLE (FLAGYL) IVPB  500 mg  Status:  Discontinued     500 mg 100 mL/hr over 60 Minutes Intravenous  Once 05/06/13 2033 05/07/13 0738   05/06/13 2000  levofloxacin (LEVAQUIN) IVPB 750 mg  Status:  Discontinued     750 mg 100 mL/hr over 90 Minutes Intravenous Every 24 hours 05/06/13 1955 05/06/13 2010          Subjective:   [employer] today has, No headache, No chest pain, No abdominal pain - No Nausea, No new weakness tingling or numbness, No Cough - SOB.    Objective:   Filed Vitals:   05/16/13 2051 05/17/13 0439 05/17/13 0959 05/17/13 1016  BP: 110/68 130/66  120/78  Pulse: 105 108  124  Temp: 98.1 F (36.7 C) 98.1 F (36.7 C)    TempSrc: Oral Oral    Resp: 18 18    Height:      Weight:      SpO2: 94% 95% 91% 100%    Wt Readings from Last 3 Encounters:  05/14/13 88.8 kg (195 lb 12.3 oz)  05/14/13 88.8 kg (195 lb 12.3 oz)  05/14/13 88.8 kg (195 lb 12.3 oz)     Intake/Output Summary (Last 24 hours) at 05/17/13 1128 Last data filed at 05/17/13 0653  Gross per 24 hour  Intake    842 ml  Output    700 ml  Net    142 ml    Exam Awake Alert, Oriented X 3, No new F.N deficits, Normal affect [place].AT,PERRAL Supple Neck,No JVD, No cervical lymphadenopathy appriciated.  Symmetrical Chest wall movement, Good air movement bilaterally, CTAB RRR,No Gallops,Rubs or new Murmurs, No Parasternal Heave +ve B.Sounds, Abd Soft, Non tender, No organomegaly appriciated, No rebound - guarding or rigidity. No Cyanosis, Clubbing or edema, No new Rash or bruise      Data Review   Micro Results Recent Results (from the past 240 hour(s))  CULTURE, BLOOD (ROUTINE X 2)     Status: None   Collection Time    05/08/13  6:46 PM      Result Value Range Status   Specimen Description BLOOD RIGHT ARM   Final   Special Requests BOTTLES DRAWN AEROBIC AND ANAEROBIC 10CC   Final   Culture   Setup Time     Final   Value: 05/09/2013 01:08     Performed at [facility]   Culture     Final   Value: NO GROWTH 5 DAYS     Performed at [facility]   Report Status 05/15/2013 FINAL   Final  CULTURE, BLOOD (ROUTINE X 2)     Status: None   Collection Time    05/08/13  6:54 PM      Result Value Range Status   Specimen Description BLOOD RIGHT HAND   Final   Special Requests BOTTLES DRAWN AEROBIC ONLY 4CC   Final   Culture  Setup Time     Final   Value: 05/09/2013 01:12     Performed at [facility]   Culture     Final   Value: NO GROWTH 5 DAYS     Performed at [facility]   Report Status 05/15/2013 FINAL   Final  FUNGUS CULTURE W SMEAR     Status: None   Collection Time    05/09/13  9:20 AM      Result Value Range Status   Specimen Description PLEURAL FLUID RIGHT   Final   Special Requests PT ON  VANCO FLAGYL MAXIPIME   Final   Fungal Smear     Final   Value: NO YEAST OR FUNGAL ELEMENTS SEEN     Performed at [facility]   Culture     Final   Value: CULTURE IN PROGRESS FOR FOUR WEEKS     Performed at [facility]   Report Status PENDING   Incomplete  AFB CULTURE WITH SMEAR     Status: None   Collection Time    05/09/13  9:20 AM      Result Value Range Status   Specimen Description PLEURAL FLUID RIGHT   Final   Special Requests PT ON VANCO FLAGYL MAXIPIME   Final   ACID FAST SMEAR     Final   Value: NO ACID FAST BACILLI SEEN     Performed at [facility]   Culture     Final   Value: CULTURE WILL BE EXAMINED FOR 6 WEEKS BEFORE ISSUING A FINAL REPORT     Performed at [facility]   Report Status PENDING   Incomplete  BODY FLUID CULTURE     Status: None   Collection Time    05/09/13  9:20 AM      Result Value Range Status   Specimen Description PLEURAL FLUID RIGHT   Final   Special Requests PT ON VANCO FLAGYL MAXIPIME   Final   Gram Stain     Final   Value: ABUNDANT WBC PRESENT,BOTH PMN AND MONONUCLEAR      ABUNDANT GRAM POSITIVE COCCI IN CLUSTERS     Gram Stain Report Called to,Read Back By and Verified With: Gram Stain Report Called to,Read Back By and Verified With:  PUJA PATEL @0747  ON 05/10/13 BY SMIAS     Performed at [facility]   Culture     Final   Value: ABUNDANT GROUP B STREP(S.AGALACTIAE)ISOLATED     Note: This organism DOES NOT demonstrate inducible Clindamycin resistance in vitro.     Performed at [facility]   Report Status 05/15/2013 FINAL   Final   Organism ID, Bacteria GROUP B STREP(S.AGALACTIAE)ISOLATED   Final  FUNGUS CULTURE W SMEAR     Status: None   Collection Time    05/09/13  9:26 AM      Result Value Range Status   Specimen Description PLEURAL FLUID RIGHT   Final   Special Requests PT ON VANCO FLAGYL MAXIPIME   Final   Fungal Smear     Final   Value: NO YEAST OR FUNGAL ELEMENTS SEEN     Performed at [facility]   Culture     Final   Value: CULTURE IN PROGRESS FOR FOUR WEEKS     Performed at [facility]   Report Status PENDING   Incomplete  AFB CULTURE WITH SMEAR     Status: None   Collection Time    05/09/13  9:26 AM      Result Value Range Status   Specimen Description PLEURAL FLUID RIGHT   Final   Special Requests PT ON VANCO FLAGYL MAXIPIME   Final   ACID FAST SMEAR     Final   Value: NO ACID FAST BACILLI SEEN     Performed at [facility]   Culture     Final   Value: CULTURE WILL BE EXAMINED FOR 6 WEEKS BEFORE ISSUING A FINAL REPORT     Performed at [facility]   Report Status PENDING  Incomplete  BODY FLUID CULTURE     Status: None   Collection Time    05/09/13  9:26 AM      Result Value Range Status   Specimen Description PLEURAL FLUID RIGHT   Final   Special Requests PT ON VANCO FLAGYL MAXIPIME   Final   Gram Stain     Final   Value: RARE WBC PRESENT, PREDOMINANTLY PMN     NO ORGANISMS SEEN     Performed at [facility]   Culture     Final   Value: RARE GROUP B  STREP(S.AGALACTIAE)ISOLATED     Note: SUSCEPTIBILITIES PERFORMED ON PREVIOUS CULTURE WITHIN THE LAST 5 DAYS.     Performed at [facility]   Report Status 05/15/2013 FINAL   Final  AFB CULTURE WITH SMEAR     Status: None   Collection Time    05/09/13  9:39 AM      Result Value Range Status   Specimen Description TISSUE PLEURAL   Final   Special Requests RIGHT PLEURAL PEEL PT ON VANCO FLAGYL MAXIPIME   Final   ACID FAST SMEAR     Final   Value: NO ACID FAST BACILLI SEEN     Performed at [facility]   Culture     Final   Value: CULTURE WILL BE EXAMINED FOR 6 WEEKS BEFORE ISSUING A FINAL REPORT     Performed at [facility]   Report Status PENDING   Incomplete  FUNGUS CULTURE W SMEAR     Status: None   Collection Time    05/09/13  9:39 AM      Result Value Range Status   Specimen Description TISSUE PLEURAL   Final   Special Requests RIGHT PLEURAL PEEL PT ON VANCO FLAGYL MAXIPIME   Final   Fungal Smear     Final   Value: NO YEAST OR FUNGAL ELEMENTS SEEN     Performed at [facility]   Culture     Final   Value: CULTURE IN PROGRESS FOR FOUR WEEKS     Performed at [facility]   Report Status PENDING   Incomplete  TISSUE CULTURE     Status: None   Collection Time    05/09/13  9:39 AM      Result Value Range Status   Specimen Description TISSUE PLEURAL   Final   Special Requests RIGHT PLEURAL PEEL PT ON VANCO FLAGYL MAXIPIME   Final   Gram Stain     Final   Value: RARE WBC PRESENT,BOTH PMN AND MONONUCLEAR     NO ORGANISMS SEEN     Performed at [facility]   Culture     Final   Value: RARE GROUP B STREP(S.AGALACTIAE)ISOLATED     Note: TESTING AGAINST S. AGALACTIAE NOT ROUTINELY PERFORMED DUE TO PREDICTABILITY OF AMP/PEN/VAN SUSCEPTIBILITY.     Performed at [facility]   Report Status 05/13/2013 FINAL   Final  ANAEROBIC CULTURE     Status: None   Collection Time    05/09/13  9:39 AM      Result Value Range Status    Specimen Description TISSUE PLEURAL   Final   Special Requests RIGHT PLEURAL PEEL PT ON VANCO FLAYGYL MAXIPIME   Final   Gram Stain     Final   Value: RARE WBC PRESENT,BOTH PMN AND MONONUCLEAR     NO ORGANISMS SEEN     Performed at [facility]  Culture     Final   Value: NO ANAEROBES ISOLATED     Performed at [facility]   Report Status 05/14/2013 FINAL   Final  AFB CULTURE WITH SMEAR     Status: None   Collection Time    05/09/13  9:52 AM      Result Value Range Status   Specimen Description TISSUE PLEURAL   Final   Special Requests PARIETAL PLEURAL PEEL PT ON VANCO FLAGYL MAXIPIME   Final   ACID FAST SMEAR     Final   Value: NO ACID FAST BACILLI SEEN     Performed at [facility]   Culture     Final   Value: CULTURE WILL BE EXAMINED FOR 6 WEEKS BEFORE ISSUING A FINAL REPORT     Performed at [facility]   Report Status PENDING   Incomplete  FUNGUS CULTURE W SMEAR     Status: None   Collection Time    05/09/13  9:52 AM      Result Value Range Status   Specimen Description TISSUE PLEURAL   Final   Special Requests PARIETAL PLEURAL PEEL PT ON VANCO FLAGYL MAXIPIME   Final   Fungal Smear     Final   Value: NO YEAST OR FUNGAL ELEMENTS SEEN     Performed at [facility]   Culture     Final   Value: CULTURE IN PROGRESS FOR FOUR WEEKS     Performed at [facility]   Report Status PENDING   Incomplete  ANAEROBIC CULTURE     Status: None   Collection Time    05/09/13  9:52 AM      Result Value Range Status   Specimen Description TISSUE PLEURAL   Final   Special Requests PARIETAL PLEURAL PEEL PT ON VANCO FLAGYL MAXIPIME   Final   Gram Stain     Final   Value: RARE WBC PRESENT,BOTH PMN AND MONONUCLEAR     NO ORGANISMS SEEN     Performed at [facility]   Culture     Final   Value: NO ANAEROBES ISOLATED     Performed at [facility]   Report Status 05/14/2013 FINAL   Final  TISSUE CULTURE     Status: None    Collection Time    05/09/13  9:52 AM      Result Value Range Status   Specimen Description TISSUE PLEURAL   Final   Special Requests PARIETAL PLEURAL PEEL PT ON VANCO FLAGYL MAXIPIME   Final   Gram Stain     Final   Value: RARE WBC PRESENT,BOTH PMN AND MONONUCLEAR     NO ORGANISMS SEEN     Performed at [facility]   Culture     Final   Value: MODERATE GROUP B STREP(S.AGALACTIAE)ISOLATED     Note: TESTING AGAINST S. AGALACTIAE NOT ROUTINELY PERFORMED DUE TO PREDICTABILITY OF AMP/PEN/VAN SUSCEPTIBILITY.     Performed at [facility]   Report Status 05/13/2013 FINAL   Final  AFB CULTURE WITH SMEAR     Status: None   Collection Time    05/09/13 11:06 AM      Result Value Range Status   Specimen Description TISSUE LUNG RIGHT   Final   Special Requests RIGHT LOWER LOBE PT ON VANCOMYCIN,FLAGYL,MAXIPIME   Final   ACID FAST SMEAR     Final   Value: NO ACID FAST BACILLI SEEN  Performed at [facility]     Final   Value: CULTURE WILL BE EXAMINED FOR 6 WEEKS BEFORE ISSUING A FINAL REPORT     Performed at [facility]   Report Status PENDING   Incomplete  FUNGUS CULTURE W SMEAR     Status: None   Collection Time    05/09/13 11:06 AM      Result Value Range Status   Specimen Description TISSUE LUNG RIGHT   Final   Special Requests RIGHT LOWER LOBE PT ON VANCOMYCIN,FLAGYL,MAXIPIME   Final   Fungal Smear     Final   Value: NO YEAST OR FUNGAL ELEMENTS SEEN     Performed at [facility]   Culture     Final   Value: CULTURE IN PROGRESS FOR FOUR WEEKS     Performed at [facility]   Report Status PENDING   Incomplete  ANAEROBIC CULTURE     Status: None   Collection Time    05/09/13 11:06 AM      Result Value Range Status   Specimen Description TISSUE LUNG RIGHT   Final   Special Requests RIGHT LOWER LOBE PT ON VANCOMYCIN,FLAGYL,MAXIPIME   Final   Gram Stain     Final   Value: RARE WBC PRESENT,BOTH PMN AND MONONUCLEAR     NO  ORGANISMS SEEN     Performed at [facility]   Culture     Final   Value: NO ANAEROBES ISOLATED     Performed at [facility]   Report Status 05/14/2013 FINAL   Final  TISSUE CULTURE     Status: None   Collection Time    05/09/13 11:06 AM      Result Value Range Status   Specimen Description TISSUE LUNG RIGHT   Final   Special Requests RIGHT LOWER LOBE PT ON VANCOMYCIN,FLAGYL,MAXIPIME   Final   Gram Stain     Final   Value: RARE WBC PRESENT,BOTH PMN AND MONONUCLEAR     NO ORGANISMS SEEN     Performed at [facility]   Culture     Final   Value: RARE GROUP B STREP(S.AGALACTIAE)ISOLATED     Note: TESTING AGAINST S. AGALACTIAE NOT ROUTINELY PERFORMED DUE TO PREDICTABILITY OF AMP/PEN/VAN SUSCEPTIBILITY.     Performed at [facility]   Report Status 05/13/2013 FINAL   Final    Radiology Reports Dg Chest 2 View  05/15/2013   CLINICAL DATA:  Short of breath  EXAM: CHEST  2 VIEW  COMPARISON:  Yesterday  FINDINGS: Diffuse interstitial infiltrates worse throughout the right lung are stable. Bilateral pleural effusions stable. Mild cardiomegaly unchanged. No pneumothorax. Right internal jugular central venous catheter stable.  IMPRESSION: Bilateral effusions and interstitial edema are stable.   Electronically Signed   By: Maryclare Bean M.D.   On: 05/15/2013 07:43   Dg Chest 2 View  04/29/2013   CLINICAL DATA:  Fever.  Shortness of breath.  EXAM: CHEST  2 VIEW  COMPARISON:  01/25/2010  FINDINGS: Heart size is within normal limits. Chronic central peribronchial thickening and interstitial prominence is unchanged. No evidence of acute or superimposed infiltrate. No evidence of pleural effusion. No mass or lymphadenopathy identified.  IMPRESSION: Stable exam. No active disease.   Electronically Signed   By: Myles Rosenthal M.D.   On: 04/29/2013 17:43   Dg Lumbar Spine Complete  05/06/2013   CLINICAL DATA:  Chronic low back pain  EXAM: LUMBAR  SPINE - COMPLETE 4+ VIEW   COMPARISON:  Prior CT from 04/29/2013  FINDINGS: Overall alignment is stable as compared to the prior examination with trace retrolisthesis of L3 on L4. Sequelae of prior vertebral augmentation are again seen at L3 and L4, unchanged. Vertebral body height loss T11, T12, L3, and L4 levels is not significantly changed. Multilevel degenerative disc disease and facet arthrosis is grossly similar, most severe at L2-3. No acute fracture listhesis. Diffuse osteopenia is present.  Paravertebral soft tissues are within normal limits. Prominent atherosclerotic calcifications noted within the infrarenal aorta.  IMPRESSION: 1. No acute osseous abnormality identified within the lumbar spine. 2. Stable appearance of the lumbar spine with prior vertebral augmentation at L3 and L4 with multilevel compression deformities and degenerative disc disease. .   Electronically Signed   By: Rise Mu M.D.   On: 05/06/2013 22:44   Ct Chest W Contrast  05/07/2013   CLINICAL DATA:  Patient with a loculated effusion. She was to have a thoracentesis today he, but the initial ultrasound scan suggests the that the fluid may at least in part be within consolidated lung. The right effusion was clearly and not free flowing. The patient's prior CT demonstrated essentially clear lungs, performed 8 days previously.  EXAM: CT CHEST WITH CONTRAST  TECHNIQUE: Multidetector CT imaging of the chest was performed during intravenous contrast administration.  CONTRAST:  80mL OMNIPAQUE IOHEXOL 300 MG/ML  SOLN  COMPARISON:  Current chest radiograph, 05/06/2013. Prior chest CT, 04/29/2013.  FINDINGS: There is loculated pleural fluid above the right hemidiaphragm along the right posterior lateral hemithorax extending to the level of the carina with minimal loculated pleural fluid noted in the right lateral upper hemithorax. The small mild loculated pleural fluid extends along the azygoesophageal recess. There is intervening collapsed/consolidated  lung. Within the right posterior lateral lower hemi thorax pleural fluid there are bubbles of air.  On the left there is a small posterior pleural effusion with mild associated subsegmental dependent atelectasis.  There are few minor areas of reticular and focal ground-glass opacity in the upper lobes. There is no pulmonary edema.  There are prominent mediastinal lymph nodes. A right peritracheal node measures 11 mm in short axis. Above this there is an 8 mm node. There is soft tissue above the right pulmonary artery which appears to be a prominent azygos arch. The heart is normal in size. There is minimal pericardial fluid. The great vessels are normal in caliber.  A 15 mm low-density nodule projects along the posterior margin of the left thyroid lobe.  Limited evaluation of the upper abdomen shows fatty infiltration of the liver, borderline hepatomegaly and changes from a cholecystectomy.  There are degenerative changes of the visualized spine. No osteoblastic or osteolytic lesions.  IMPRESSION: 1. Loculated right pleural effusion with associated atelectatic/ consolidated lung. This has a complex appearance. A few small bubbles of air within the presumed pleural fluid these is suspicion for a bronchopleural fistula. There is a small left pleural effusion with minor dependent subsegmental atelectasis. 2. Thoracentesis to obtain diagnostic fluid can be performed. Risks from this procedure are greater than for a typical free-flowing pleural effusion. Recommend consideration of placement of a pigtail drainage catheter within the largest loculation of pleural fluid to allow diagnostic acquisition of fluid and persistent drainage.   Electronically Signed   By: Amie Portland M.D.   On: 05/07/2013 15:58   Ct Angio Chest Pe W/cm &/or Wo Cm  04/29/2013   CLINICAL DATA:  Right  abdominal pain with fever and chills for 4 days. Recently diagnosed with chikungunya disease.  EXAM: CT ANGIOGRAPHY CHEST  CT ABDOMEN AND PELVIS  WITH CONTRAST  TECHNIQUE: Multidetector CT imaging of the chest was performed using the standard protocol during bolus administration of intravenous contrast. Multiplanar CT image reconstructions including MIPs were obtained to evaluate the vascular anatomy. Multidetector CT imaging of the abdomen and pelvis was performed using the standard protocol during bolus administration of intravenous contrast.  CONTRAST:  OMNIPAQUE IOHEXOL 350 MG/ML SOLN  COMPARISON:  None.  FINDINGS: CTA CHEST FINDINGS  There is satisfactory but not optimal opacification of the pulmonary arteries. No pulmonary emboli are demonstrated. There is mild atherosclerosis of the aorta and great vessels.  Small subcarinal and right hilar lymph nodes are noted, not pathologically enlarged. There is a small hiatal hernia. There is a small amount of pleural thickening on the right. No significant pleural or pericardial effusion is present. There is a probable sebaceous cyst anterior to the lower sternum.  Mild pulmonary emphysematous changes are noted. There is mild atelectasis or scarring in the right lower lobe. No confluent airspace opacity or endobronchial lesion is present.  CT ABDOMEN and PELVIS FINDINGS  There is mild extrahepatic biliary dilatation status post cholecystectomy. The liver and pancreas appear normal. The spleen and adrenal glands appear normal. The right kidney has a horizontal lie, but no focal cortical abnormality. There is no hydronephrosis.  There is aortoiliac atherosclerosis. No enlarged abdominal pelvic lymph nodes are seen. The subacute small bowel appear normal aside from a lipoma within the 3rd portion of the duodenum. The appendix appears normal. Sigmoid colon diverticular changes are present without surrounding inflammation.  There is no pelvic mass status post hysterectomy. The bladder appears normal. There is a suprapubic ventral hernia containing fat.  Compression deformities at L3 and L4 are unchanged from a  prior lumbar spine radiographs. No acute osseous findings are seen.  Review of the MIP images confirms the above findings.  IMPRESSION: 1. No evidence of acute pulmonary embolism. 2. No acute chest findings identified. There is mild right basilar scarring or atelectasis with pleural thickening. No significant pleural effusion. 3. No acute abdominal pelvic findings. 4. Suprapubic ventral hernia containing fat, atherosclerosis, chronic lumbar compression deformities and sigmoid colon diverticulosis are noted.   Electronically Signed   By: Roxy Horseman M.D.   On: 04/29/2013 17:40   [place] Chest  05/07/2013   CLINICAL DATA:  Patient presents for ultrasound-guided thoracentesis of the right chest for a new area of loculated pleural fluid on the right.  EXAM: CHEST ULTRASOUND  COMPARISON:  Chest radiograph, 05/06/2013  FINDINGS: There is a loculated area pleural fluid at the right lung base that is mostly surrounded by lung that has internal echoes consistent with consolidated lung. It is unclear whether this fluid resides within the lung, within the visceral pleura, or whether this is a loculated pleural effusion, or combination of both. For this reason, thoracentesis was not performed. The patient 1 ago a contrast enhanced chest CT for further delineation of this collection.  A small left pleural effusion was incidentally noted.  IMPRESSION: Loculated right pleural fluid versus an area of necrosis in consolidated right lung or combination of both.   Electronically Signed   By: Amie Portland M.D.   On: 05/07/2013 13:37   Ct Abdomen Pelvis W Contrast  04/29/2013   CLINICAL DATA:  Right abdominal pain with fever and chills for 4 days. Recently diagnosed with [place]  disease.  EXAM: CT ANGIOGRAPHY CHEST  CT ABDOMEN AND PELVIS WITH CONTRAST  TECHNIQUE: Multidetector CT imaging of the chest was performed using the standard protocol during bolus administration of intravenous contrast. Multiplanar CT image  reconstructions including MIPs were obtained to evaluate the vascular anatomy. Multidetector CT imaging of the abdomen and pelvis was performed using the standard protocol during bolus administration of intravenous contrast.  CONTRAST:  OMNIPAQUE IOHEXOL 350 MG/ML SOLN  COMPARISON:  None.  FINDINGS: CTA CHEST FINDINGS  There is satisfactory but not optimal opacification of the pulmonary arteries. No pulmonary emboli are demonstrated. There is mild atherosclerosis of the aorta and great vessels.  Small subcarinal and right hilar lymph nodes are noted, not pathologically enlarged. There is a small hiatal hernia. There is a small amount of pleural thickening on the right. No significant pleural or pericardial effusion is present. There is a probable sebaceous cyst anterior to the lower sternum.  Mild pulmonary emphysematous changes are noted. There is mild atelectasis or scarring in the right lower lobe. No confluent airspace opacity or endobronchial lesion is present.  CT ABDOMEN and PELVIS FINDINGS  There is mild extrahepatic biliary dilatation status post cholecystectomy. The liver and pancreas appear normal. The spleen and adrenal glands appear normal. The right kidney has a horizontal lie, but no focal cortical abnormality. There is no hydronephrosis.  There is aortoiliac atherosclerosis. No enlarged abdominal pelvic lymph nodes are seen. The subacute small bowel appear normal aside from a lipoma within the 3rd portion of the duodenum. The appendix appears normal. Sigmoid colon diverticular changes are present without surrounding inflammation.  There is no pelvic mass status post hysterectomy. The bladder appears normal. There is a suprapubic ventral hernia containing fat.  Compression deformities at L3 and L4 are unchanged from a prior lumbar spine radiographs. No acute osseous findings are seen.  Review of the MIP images confirms the above findings.  IMPRESSION: 1. No evidence of acute pulmonary embolism.  2. No acute chest findings identified. There is mild right basilar scarring or atelectasis with pleural thickening. No significant pleural effusion. 3. No acute abdominal pelvic findings. 4. Suprapubic ventral hernia containing fat, atherosclerosis, chronic lumbar compression deformities and sigmoid colon diverticulosis are noted.   Electronically Signed   By: Roxy Horseman M.D.   On: 04/29/2013 17:40   Dg Chest Port 1 View  05/14/2013   CLINICAL DATA:  Status post video-assisted thoracoscopy and right chest decortication.  EXAM: PORTABLE CHEST - 1 VIEW  COMPARISON:  05/13/2013  FINDINGS: Irregularly thickened interstitial markings, greater on the right, are without significant change. Blunting of the right costophrenic sulcus likely from pleural fluid versus pleural thickening is stable. Small bilateral pleural effusions are noted, also stable. No new lung opacities. No pneumothorax.  Right internal jugular central venous line is stable in well positioned.  IMPRESSION: 1. No change from the previous day's study.   Electronically Signed   By: Amie Portland M.D.   On: 05/14/2013 07:32   Dg Chest Port 1 View  05/13/2013   CLINICAL DATA:  Empyema.  EXAM: PORTABLE CHEST - 1 VIEW  COMPARISON:  05/12/2013  FINDINGS: Status post removal of the remaining right chest tube. No pneumothorax.  Vascular congestion has improved. Persistent lung base opacity is noted likely combination of pleural effusions and atelectasis.  No pneumothorax. Right internal jugular central venous line is stable.  IMPRESSION: 1. Removal of the remaining right chest tube.  No pneumothorax. 2. Improved vascular congestion.   Electronically  Signed   By: Amie Portland M.D.   On: 05/13/2013 08:10   Dg Chest Port 1 View  05/12/2013   CLINICAL DATA:  Decortication.  EXAM: PORTABLE CHEST - 1 VIEW  COMPARISON:  One-view chest 05/11/2013.  CT chest 05/07/2013.  FINDINGS: The heart size is exaggerated by low lung volumes. 1 of the right-sided chest  tubes has been removed. There is no pneumothorax. Right-sided pleural scarring is stable. A right IJ line is stable in position. A left pleural effusion is stable. Bibasilar airspace disease remains. This likely reflects atelectasis. There is no new airspace disease. Degenerative changes are noted at the shoulders.  IMPRESSION: 1. Interval removal of 1 of the right-sided chest tube without evidence for pneumothorax. 2. The other chest tube is stable.  The right IJ line is stable. 3. Stable pulmonary vascular congestion and left pleural effusion. 4. Stable pleural thickening and scarring on the right.   Electronically Signed   By: Gennette Pac M.D.   On: 05/12/2013 07:15   Dg Chest Port 1 View  05/11/2013   CLINICAL DATA:  Status post VATS procedure  EXAM: PORTABLE CHEST - 1 VIEW  COMPARISON:  05/10/2013  FINDINGS: Two thoracostomy catheters are again noted on the right. No pneumothorax is noted. Interstitial changes are again noted within the right lung. A right central venous line is again seen and stable. Small effusion is noted on the left stable from the prior exam. The cardiac shadow is unchanged.  IMPRESSION: When compare with the prior exam no significant interval change is noted.   Electronically Signed   By: Alcide Clever M.D.   On: 05/11/2013 07:28   Dg Chest Port 1 View  05/10/2013   CLINICAL DATA:  Status post VATS and wedge resection of right lower lobe. Right-sided pleural effusion/empyema.  EXAM: PORTABLE CHEST - 1 VIEW  COMPARISON:  One day prior  FINDINGS: Right IJ central line unchanged. Two right-sided chest tubes are unchanged in position. No pneumothorax.  Cardiomegaly with atherosclerosis in the transverse aorta. Probable small bilateral pleural effusions, as before.  Interstitial edema, accentuated by extremely low lung volumes. Greater on the right than left.  Improved inferolateral right base airspace disease with persistent left base atelectasis.  IMPRESSION: Minimal improvement in  right base aeration.  Otherwise, similar appearance of the chest with pleural effusions, atelectasis, and interstitial edema.  Two right-sided chest tubes remain in place; no pneumothorax.   Electronically Signed   By: Jeronimo Greaves M.D.   On: 05/10/2013 07:34   Dg Chest Portable 1 View  05/09/2013   CLINICAL DATA:  Status post thoracoscopy with wedge resection of the right lower lobe.  EXAM: PORTABLE CHEST - 1 VIEW  COMPARISON:  April 29, 2013.  FINDINGS: The lungs are reasonably well inflated. There are 2 chest tubes in place on the right with the tips near the pulmonary apex. There is no evidence of a pneumothorax. There is consolidation of portions of the right mid and lower lung. There is no mediastinal shift. The right internal jugular venous catheter tip lies in the region of the proximal to mid SVC. The cardiac silhouette is mildly enlarged. The pulmonary vascularity is mildly prominent bilaterally. There is partial obscure a shin of the left hemidiaphragm which may reflect minimal subsegmental atelectasis.  IMPRESSION: 1. The patient is status post right-sided thoracoscopy with right lower lobe wedge resection. Two chest tubes are in place with no evidence of a significant pneumothorax or pleural effusion. There is parenchymal  consolidation in the right mid to lower lung. 2. The appearance of the heart and pulmonary vascularity suggesting low-grade CHF. 3. These results will be called to the ordering clinician or representative by the Radiologist Assistant, and communication documented in the PACS Dashboard.   Electronically Signed   By: David  [place]   On: 05/09/2013 14:37   Dg Abd Acute W/chest  05/06/2013   CLINICAL DATA:  Chronic abdominal pain.  EXAM: ACUTE ABDOMEN SERIES (ABDOMEN 2 VIEW & CHEST 1 VIEW)  COMPARISON:  04/29/2013.  FINDINGS: The cardiac silhouette, mediastinal and hilar contours are within normal limits and stable There is a new probable loculated pleural fluid collection on the  right and a right lung infiltrate. This is unlikely a mass given the recent normal Chest x-ray and chest CT. The left lung is clear. The bony thorax is intact.  The abdominal radiographs demonstrate an unremarkable bowel gas pattern. There is scattered contrast throughout the colon. The soft tissue shadows are maintained. The bony structures are intact. Stable vertebral augmentation changes at L3 and L4 new large probable loculated right pleural effusion and right lung infiltrate.  IMPRESSION: New probable loculated right pleural fluid collection and right lung infiltrate.  No acute abdominal findings. Contrast noted throughout the colon.   Electronically Signed   By: Loralie Champagne M.D.   On: 05/06/2013 19:46    CBC  Recent Labs Lab 05/13/13 0419 05/13/13 1625 05/14/13 0400 05/15/13 0612 05/17/13 0503  WBC 16.9* 18.5* 18.2* 17.7* 12.6*  HGB 7.1* 7.7* 7.1* 7.6* 7.4*  HCT 21.0* 22.8* 21.2* 23.0* 23.0*  PLT 472* 590* 595* 739* 743*  MCV 89.7 89.8 90.6 90.9 93.1  MCH 30.3 30.3 30.3 30.0 30.0  MCHC 33.8 33.8 33.5 33.0 32.2  RDW 13.9 14.0 14.4 14.7 14.4    Chemistries   Recent Labs Lab 05/11/13 0420 05/12/13 0413 05/13/13 0419 05/13/13 1625 05/14/13 0400 05/15/13 0612 05/17/13 0503  NA 134* 131* 132* 129* 131* 132* 135  K 3.3* 3.1* 3.0* 3.7 3.7 3.7 3.8  CL 102 99 98 95* 98 97 98  CO2 20 21 23 24 25 26 28   GLUCOSE 159* 95 150* 239* 126* 194* 137*  BUN 23 19 17 16 15 13 12   CREATININE 1.15* 1.17* 1.12* 1.16* 1.07 1.10 1.09  CALCIUM 7.6* 7.6* 7.3* 7.4* 7.7* 7.9* 8.1*  MG  --   --  1.5 2.0  --   --   --   AST 12 14  --   --   --   --   --   ALT 8 8  --   --   --   --   --   ALKPHOS 113 138*  --   --   --   --   --   BILITOT 0.4 0.4  --   --   --   --   --    ------------------------------------------------------------------------------------------------------------------ estimated creatinine clearance is 62.7 ml/min (by C-G formula based on Cr of  1.09). ------------------------------------------------------------------------------------------------------------------ No results found for this basename: HGBA1C,  in the last 72 hours ------------------------------------------------------------------------------------------------------------------ No results found for this basename: CHOL, HDL, LDLCALC, TRIG, CHOLHDL, LDLDIRECT,  in the last 72 hours ------------------------------------------------------------------------------------------------------------------ No results found for this basename: TSH, T4TOTAL, FREET3, T3FREE, THYROIDAB,  in the last 72 hours ------------------------------------------------------------------------------------------------------------------ No results found for this basename: VITAMINB12, FOLATE, FERRITIN, TIBC, IRON, RETICCTPCT,  in the last 72 hours  Coagulation profile No results found for this basename: INR, PROTIME,  in the last  168 hours  No results found for this basename: DDIMER,  in the last 72 hours  Cardiac Enzymes  Recent Labs Lab 05/13/13 1625  TROPONINI <0.30   ------------------------------------------------------------------------------------------------------------------ No components found with this basename: POCBNP,      Time Spent in minutes   45   Susa Raring K M.D on 05/17/2013 at 11:28 AM  Between 7am to 7pm - Pager - [phone]  After 7pm go to [email or web address] - password [email or web address]  And look for the night coverage person covering for me after hours  [facility] Office  [phone]

## 2013-05-17 NOTE — Progress Notes (Signed)
Physical Therapy Treatment Patient Details Name: Kelli Summers MRN: [id number] DOB: 08/28/1955 Today's Date: 05/17/2013 Time: 4098-1191 PT Time Calculation (min): 27 min  PT Assessment / Plan / Recommendation  History of Present Illness 57 yo with past medical history of chronic pain, depression and DM presenting to [facility] with malaise, fever, chest pain, dyspnea and hyperglycemia.  Chest imaging revealed loculated effusion.s transferred to [facility] under TCTS and underwent VATS / decortication.  She exhibited fluctuating levels of consciousness pre and post operatively.  PCCM was asked to evaluate.   PT Comments   Pt continues with decr cognition (didn't recall why she was in the hospital, that she was NPO for a test, and even agitated that no one had told her she had a test--which RN states she had gone over with pt). Pt also unable to find her room when told the room number. Ambulating better today, however continues to need frequent cues for safe use of RW.    Follow Up Recommendations  Home health PT;Supervision/Assistance - 24 hour     Does the patient have the potential to tolerate intense rehabilitation     Barriers to Discharge        Equipment Recommendations  Rolling walker with 5" wheels    Recommendations for Other Services    Frequency Min 3X/week   Progress towards PT Goals Progress towards PT goals: Progressing toward goals  Plan Current plan remains appropriate    Precautions / Restrictions Precautions Precautions: Fall   Pertinent Vitals/Pain Reported chronic back problems, however they have been worse since this admission. Not limiting her ambulation distance today with no reports of pain. SaO2 at rest on RA 91%; with walking 94% RA; seated afterwards 100%  HR 100-133 bpm   Mobility  Bed Mobility Bed Mobility: Sitting - Scoot to Edge of Bed;Rolling Right;Right Sidelying to Sit Rolling Right: 5: Supervision;With rail Right Sidelying to Sit: 5: Supervision Sitting  - Scoot to Edge of Bed: 5: Supervision Details for Bed Mobility Assistance: pt began to initiate putting legs over EOB and pull up from supine to sit; reminded pt this could incr back pain and to roll onto her side and then sit--which she then did Transfers Transfers: Sit to Stand;Stand to Sit Sit to Stand: 4: Min guard Stand to Sit: 4: Min guard Details for Transfer Assistance: x3; Minguard for safety with max cues for hand placement. Poor carryover despite repetition Ambulation/Gait Ambulation/Gait Assistance: 4: Min guard Ambulation Distance (Feet): 160 Feet Assistive device: Rolling walker Ambulation/Gait Assistance Details: vc for safe use of RW and to avoid objects on her Rt (pt with poor attention to her position in the hallway as the hallway curved to the left) Gait Pattern: Step-through pattern;Shuffle;Decreased stride length;Trunk flexed Gait velocity: slow Stairs: No    Exercises     PT Diagnosis:    PT Problem List:   PT Treatment Interventions:     PT Goals (current goals can now be found in the care plan section) Acute Rehab PT Goals Patient Stated Goal: did not set PT Goal Formulation: With patient Time For Goal Achievement: 05/18/13 Potential to Achieve Goals: Fair  Visit Information  Last PT Received On: 05/17/13 Assistance Needed: +1 History of Present Illness: 57 yo with past medical history of chronic pain, depression and DM presenting to [facility] with malaise, fever, chest pain, dyspnea and hyperglycemia.  Chest imaging revealed loculated effusion.s transferred to [facility] under TCTS and underwent VATS / decortication.  She exhibited fluctuating levels  of consciousness pre and post operatively.  PCCM was asked to evaluate.    Subjective Data  Patient Stated Goal: did not set   Cognition  Cognition Arousal/Alertness: Awake/alert Behavior During Therapy: Flat affect;Impulsive Overall Cognitive Status: Impaired/Different from baseline Current Attention Level:  Selective Following Commands: Follows multi-step commands inconsistently Safety/Judgement: Decreased awareness of safety;Decreased awareness of deficits Problem Solving: Slow processing;Decreased initiation;Requires verbal cues General Comments: pt moving a bit more slowly/less impulsive; difficulty alternating attention between PT and RT (entered to give pt a breathing treatment)    Balance  Static Sitting Balance Static Sitting - Balance Support: Feet supported Static Sitting - Level of Assistance: 5: Stand by assistance Static Sitting - Comment/# of Minutes: supervision due to decr cognition/safety Static Standing Balance Static Standing - Balance Support: Bilateral upper extremity supported Static Standing - Level of Assistance: 5: Stand by assistance  End of Session PT - End of Session Equipment Utilized During Treatment: Gait belt;Other (comment) (Pt ambulated without O2) Activity Tolerance: Patient limited by fatigue Patient left: with call bell/phone within reach;in chair;with family/visitor present;with nursing/sitter in room Nurse Communication: Mobility status;Other (comment) (SaO2 levels pre and post)   GP     Ronney [employer]well 05/17/2013, 10:37 AM Pager [phone]

## 2013-05-18 LAB — HEMOGLOBIN AND HEMATOCRIT, BLOOD
HCT: 25.3 % — ABNORMAL LOW (ref 36.0–46.0)
Hemoglobin: 8.2 g/dL — ABNORMAL LOW (ref 12.0–15.0)

## 2013-05-18 LAB — CBC
HCT: 21.6 % — ABNORMAL LOW (ref 36.0–46.0)
Hemoglobin: 7 g/dL — ABNORMAL LOW (ref 12.0–15.0)
MCH: 30.2 pg (ref 26.0–34.0)
MCHC: 32.4 g/dL (ref 30.0–36.0)
MCV: 93.1 fL (ref 78.0–100.0)
Platelets: 771 10*3/uL — ABNORMAL HIGH (ref 150–400)
RBC: 2.32 MIL/uL — ABNORMAL LOW (ref 3.87–5.11)
RDW: 14.6 % (ref 11.5–15.5)
WBC: 11.8 10*3/uL — ABNORMAL HIGH (ref 4.0–10.5)

## 2013-05-18 LAB — GLUCOSE, CAPILLARY
Glucose-Capillary: 61 mg/dL — ABNORMAL LOW (ref 70–99)
Glucose-Capillary: 106 mg/dL — ABNORMAL HIGH (ref 70–99)
Glucose-Capillary: 165 mg/dL — ABNORMAL HIGH (ref 70–99)
Glucose-Capillary: 125 mg/dL — ABNORMAL HIGH (ref 70–99)

## 2013-05-18 LAB — PREPARE RBC (CROSSMATCH)

## 2013-05-18 MED ORDER — DEXTROSE 5 % IV SOLN: 1.0000 g | INTRAVENOUS | Status: DC

## 2013-05-18 MED ORDER — DIAZEPAM 10 MG PO TABS: 5.0000 mg | ORAL_TABLET | Freq: Two times a day (BID) | ORAL | Status: DC | PRN

## 2013-05-18 MED ORDER — FUROSEMIDE 10 MG/ML IJ SOLN
40.0000 mg | Freq: Once | INTRAMUSCULAR | Status: AC
  Administered 2013-05-18: 40 mg via INTRAVENOUS
  Filled 2013-05-18: qty 4

## 2013-05-18 MED ORDER — INSULIN DETEMIR 100 UNIT/ML ~~LOC~~ SOLN
25.0000 [IU] | Freq: Every day | SUBCUTANEOUS | Status: DC
  Filled 2013-05-18: qty 0.25

## 2013-05-18 MED ORDER — OXYCODONE-ACETAMINOPHEN 5-325 MG PO TABS: 1.0000 | ORAL_TABLET | Freq: Three times a day (TID) | ORAL | Status: DC | PRN

## 2013-05-18 MED ORDER — GLUCOSE 40 % PO GEL
ORAL | Status: AC
  Administered 2013-05-18: 37.5 g
  Filled 2013-05-18: qty 1

## 2013-05-18 NOTE — Progress Notes (Signed)
Accu. Ck. 61 g;ucose gel given peanut butter and graham crackers and oj given.

## 2013-05-18 NOTE — Progress Notes (Signed)
Patient sleeping and had a short run of S.V.T. vs At. Fib. Rate 199. Then back to S.R.

## 2013-05-18 NOTE — Progress Notes (Signed)
Hb 8.2. Ok for d/c home. Discharge instructions along with medlist and scripts provided. Patient to received IV Abx per previous PICC. Patient transported via wheelchair to lobby for d/c home. Belongings with patient family.Mamie Levers

## 2013-05-18 NOTE — Progress Notes (Signed)
Blood transfusion complete. No s/s of reaction. VSS. Call bell near. Awaiting H/H level. If 7.8 or above may continue to discharge home. Patient and family aware. Call bell near.Kelli Summers

## 2013-05-18 NOTE — Progress Notes (Addendum)
      [address]       [place] [address]             [phone]       9 Days Post-Op Procedure(s) (LRB): VIDEO ASSISTED THORACOSCOPY (VATS)/DECORTICATION (Right) WEDGE RESECTION RIGHT LOWER LOBE (Right)  Subjective:  Ms. Matsunaga has no complaints.  Is hoping to be discharged today.  Objective: Vital signs in last 24 hours: Temp:  [98.4 F (36.9 C)-98.8 F (37.1 C)] 98.4 F (36.9 C) (11/21 0300) Pulse Rate:  [105-124] 108 (11/21 0300) Cardiac Rhythm:  [-] Sinus tachycardia (11/20 2010) Resp:  [18-19] 18 (11/21 0300) BP: (118-133)/(72-79) 133/79 mmHg (11/21 0300) SpO2:  [91 %-100 %] 92 % (11/21 0300)  Intake/Output from previous day: 11/20 0701 - 11/21 0700 In: 960 [P.O.:960] Out: 1950 [Urine:1950]  General appearance: alert, cooperative and no distress Heart: regular rate and rhythm Lungs: diminished breath sounds bibasilar Wound: clean and dry  Lab Results:  Recent Labs  05/17/13 0503 05/18/13 0500  WBC 12.6* 11.8*  HGB 7.4* 7.0*  HCT 23.0* 21.6*  PLT 743* 771*   BMET:  Recent Labs  05/17/13 0503  NA 135  K 3.8  CL 98  CO2 28  GLUCOSE 137*  BUN 12  CREATININE 1.09  CALCIUM 8.1*    PT/INR: No results found for this basename: LABPROT, INR,  in the last 72 hours ABG    Component Value Date/Time   PHART 7.383 05/10/2013 0353   HCO3 22.4 05/10/2013 0353   TCO2 24 05/10/2013 0353   ACIDBASEDEF 2.0 05/10/2013 0353   O2SAT 96.0 05/10/2013 0353   CBG (last 3)   Recent Labs  05/17/13 2118 05/18/13 0628 05/18/13 0701  GLUCAP 152* 61* 106*    Assessment/Plan: S/P Procedure(s) (LRB): VIDEO ASSISTED THORACOSCOPY (VATS)/DECORTICATION (Right) WEDGE RESECTION RIGHT LOWER LOBE (Right)  1. S/P Empyema- +Grp B Strep, continue ABX 2. Incision- clean and dry 3. Dispo- patient looks good from surgery standpoint   LOS: 12 days    Raford Pitcher, ERIN 05/18/2013  Patient seen and examined, agree with above. Going home today

## 2013-05-18 NOTE — Discharge Summary (Signed)
[facility]                                                                                   Kelli Summers, is a 57 y.o. female  DOB 1955/12/09  MRN [id number].  Admission date:  05/06/2013  Admitting Physician  Osvaldo Shipper, MD  Discharge Date:  05/18/2013   Primary MD  Colette Ribas, MD  Recommendations for primary care physician for things to follow:   Kindly follow her CBC, BMP and Chest x-ray and monitor her clinically   Admission Diagnosis  Vomiting [787.03] Body aches [780.96] Pleural effusion, right [511.9] Diabetes mellitus type 2, uncontrolled [250.02] HCAP (healthcare-associated pneumonia) [486]  Discharge Diagnosis   community-acquired pneumonia with empyema and bacteremia  Active Problems:   Diabetes mellitus type 2, uncontrolled   HCAP (healthcare-associated pneumonia)   Hypokalemia   Loculated pleural effusion   Acute respiratory failure with hypoxia   Encephalopathy acute   Empyema   Anemia, blood loss   Bacteremia due to Streptococcus   Empyema lung   Physical deconditioning      Past Medical History  Diagnosis Date  . Diabetes mellitus   . History of stress test 01/2010    normal myoview stress test  . Chronic back pain   . Depression   . Chronic knee pain   . Hypertension     Past Surgical History  Procedure Laterality Date  . Back surgery    . Cholecystectomy    . Abdominal hysterectomy    . Video assisted thoracoscopy (vats)/decortication Right 05/09/2013    Procedure: VIDEO ASSISTED THORACOSCOPY (VATS)/DECORTICATION;  Surgeon: Loreli Slot, MD;  Location: [facility] OR;  Service: Thoracic;  Laterality: Right;  . Wedge resection Right 05/09/2013    Procedure: WEDGE RESECTION RIGHT LOWER LOBE;  Surgeon: Loreli Slot, MD;  Location: [facility] OR;  Service: Thoracic;  Laterality: Right;     Discharge Condition: Stable       Follow-up Information   Follow up with Loreli Slot, MD On 06/05/2013. (Appointment is at  1:30)    Specialty:  Cardiothoracic Surgery   Contact information:   [address] [place] [address] [place] [address] [phone]       Follow up with [facility] On 06/05/2013. (Please get CXR at 12:30)    Contact information:   [place]       Follow up with Colette Ribas, MD. Schedule an appointment as soon as possible for a visit in 3 days.   Specialty:  Family Medicine   Contact information:   [address] [place] [address] [phone]       Follow up with [facility], MD. Schedule an appointment as soon as possible for a visit in 1 week.   Specialty:  Pulmonary Disease   Contact information:   [address] [place] [address] [phone]       Follow up with Willa Rough, MD. Schedule an appointment as soon as possible for a visit in 1 week.   Specialty:  Cardiology   Contact information:   [address] [place] [address] [phone]  Follow up with Johny Sax, MD. Schedule an appointment as soon as possible for a visit in 1 week.   Specialty:  Infectious Diseases   Contact information:   [address] [place] [address] [phone]         Consults obtained - pulmonary, cardiothoracic surgery, ID physician Dr. Ninetta Lights over the phone   Discharge Medications      Medication List         dextrose 5 % SOLN 50 mL with cefTRIAXone 1 G SOLR 1 g  Inject 1 g into the vein daily. 21 day supply then switched to oral antibiotic.     diazepam 10 MG tablet  Commonly known as:  VALIUM  Take 0.5 tablets (5 mg total) by mouth every 12 (twelve) hours as needed for anxiety.     DULoxetine 60 MG capsule  Commonly known as:  CYMBALTA  Take 60 mg by mouth daily.     HUMALOG KWIKPEN 100 UNIT/ML Sopn  Generic drug:  insulin lispro  Inject 6-10 Units into the skin 3 (three) times daily with meals.     insulin detemir 100 UNIT/ML injection   Commonly known as:  LEVEMIR  Inject 0.2 mLs (20 Units total) into the skin at bedtime.     oxyCODONE-acetaminophen 5-325 MG per tablet  Commonly known as:  ROXICET  Take 1 tablet by mouth every 8 (eight) hours as needed for severe pain.     pantoprazole 40 MG tablet  Commonly known as:  PROTONIX  Take 1 tablet (40 mg total) by mouth daily.         Diet and Activity recommendation: See Discharge Instructions below   Discharge Instructions     Follow with Primary MD Colette Ribas, MD in 3 days   Get CBC, CMP, checked 3 days by Primary MD and again as instructed by your Primary MD. Get a 2 view Chest X ray done next visit.  Get Medicines reviewed and adjusted.  Please request your Prim.MD to go over all Hospital Tests and Procedure/Radiological results at the follow up, please get all Hospital records sent to your Prim MD by signing hospital release before you go home.  Activity: As tolerated with Full fall precautions use walker/cane & assistance as needed   Diet:  Heart healthy-low carbohydrate  Accuchecks 4 times/day, Once in AM empty stomach and then before each meal. Log in all results and show them to your Prim.MD in 3 days. If any glucose reading is under 80 or above 300 call your Prim MD immidiately. Follow Low glucose instructions for glucose under 80 as instructed.   For Heart failure patients - Check your Weight same time everyday, if you gain over 2 pounds, or you develop in leg swelling, experience more shortness of breath or chest pain, call your Primary MD immediately. Follow Cardiac Low Salt Diet and 1.8 lit/day fluid restriction.  Disposition Home   If you experience worsening of your admission symptoms, develop shortness of breath, life threatening emergency, suicidal or homicidal thoughts you must seek medical attention immediately by calling 911 or calling your MD immediately  if symptoms less severe.  You Must read complete  instructions/literature along with all the possible adverse reactions/side effects for all the Medicines you take and that have been prescribed to you. Take any new Medicines after you have completely understood and accpet all the possible adverse reactions/side effects.   Do not drive and provide baby  sitting services if your were admitted for syncope or siezures until you have seen by Primary MD or a Neurologist and advised to do so again.  Do not drive when taking Pain medications.    Do not take more than prescribed Pain, Sleep and Anxiety Medications  Special Instructions: If you have smoked or chewed Tobacco  in the last 2 yrs please stop smoking, stop any regular Alcohol  and or any Recreational drug use.  Wear Seat belts while driving.   Please note  You were cared for by a hospitalist during your hospital stay. If you have any questions about your discharge medications or the care you received while you were in the hospital after you are discharged, you can call the unit and asked to speak with the hospitalist on call if the hospitalist that took care of you is not available. Once you are discharged, your primary care physician will handle any further medical issues. Please note that NO REFILLS for any discharge medications will be authorized once you are discharged, as it is imperative that you return to your primary care physician (or establish a relationship with a primary care physician if you do not have one) for your aftercare needs so that they can reassess your need for medications and monitor your lab values.     Major procedures and Radiology Reports - PLEASE review detailed and final reports for all details, in brief -      Dg Chest 2 View  05/15/2013   CLINICAL DATA:  Short of breath  EXAM: CHEST  2 VIEW  COMPARISON:  Yesterday  FINDINGS: Diffuse interstitial infiltrates worse throughout the right lung are stable. Bilateral pleural effusions stable. Mild cardiomegaly  unchanged. No pneumothorax. Right internal jugular central venous catheter stable.  IMPRESSION: Bilateral effusions and interstitial edema are stable.   Electronically Signed   By: Maryclare Bean M.D.   On: 05/15/2013 07:43   Dg Chest 2 View  04/29/2013   CLINICAL DATA:  Fever.  Shortness of breath.  EXAM: CHEST  2 VIEW  COMPARISON:  01/25/2010  FINDINGS: Heart size is within normal limits. Chronic central peribronchial thickening and interstitial prominence is unchanged. No evidence of acute or superimposed infiltrate. No evidence of pleural effusion. No mass or lymphadenopathy identified.  IMPRESSION: Stable exam. No active disease.   Electronically Signed   By: Myles Rosenthal M.D.   On: 04/29/2013 17:43   Dg Lumbar Spine Complete  05/06/2013   CLINICAL DATA:  Chronic low back pain  EXAM: LUMBAR SPINE - COMPLETE 4+ VIEW  COMPARISON:  Prior CT from 04/29/2013  FINDINGS: Overall alignment is stable as compared to the prior examination with trace retrolisthesis of L3 on L4. Sequelae of prior vertebral augmentation are again seen at L3 and L4, unchanged. Vertebral body height loss T11, T12, L3, and L4 levels is not significantly changed. Multilevel degenerative disc disease and facet arthrosis is grossly similar, most severe at L2-3. No acute fracture listhesis. Diffuse osteopenia is present.  Paravertebral soft tissues are within normal limits. Prominent atherosclerotic calcifications noted within the infrarenal aorta.  IMPRESSION: 1. No acute osseous abnormality identified within the lumbar spine. 2. Stable appearance of the lumbar spine with prior vertebral augmentation at L3 and L4 with multilevel compression deformities and degenerative disc disease. .   Electronically Signed   By: Rise Mu M.D.   On: 05/06/2013 22:44   Ct Chest W Contrast  05/07/2013   CLINICAL DATA:  Patient with a loculated effusion.  She was to have a thoracentesis today he, but the initial ultrasound scan suggests the that the  fluid may at least in part be within consolidated lung. The right effusion was clearly and not free flowing. The patient's prior CT demonstrated essentially clear lungs, performed 8 days previously.  EXAM: CT CHEST WITH CONTRAST  TECHNIQUE: Multidetector CT imaging of the chest was performed during intravenous contrast administration.  CONTRAST:  80mL OMNIPAQUE IOHEXOL 300 MG/ML  SOLN  COMPARISON:  Current chest radiograph, 05/06/2013. Prior chest CT, 04/29/2013.  FINDINGS: There is loculated pleural fluid above the right hemidiaphragm along the right posterior lateral hemithorax extending to the level of the carina with minimal loculated pleural fluid noted in the right lateral upper hemithorax. The small mild loculated pleural fluid extends along the azygoesophageal recess. There is intervening collapsed/consolidated lung. Within the right posterior lateral lower hemi thorax pleural fluid there are bubbles of air.  On the left there is a small posterior pleural effusion with mild associated subsegmental dependent atelectasis.  There are few minor areas of reticular and focal ground-glass opacity in the upper lobes. There is no pulmonary edema.  There are prominent mediastinal lymph nodes. A right peritracheal node measures 11 mm in short axis. Above this there is an 8 mm node. There is soft tissue above the right pulmonary artery which appears to be a prominent azygos arch. The heart is normal in size. There is minimal pericardial fluid. The great vessels are normal in caliber.  A 15 mm low-density nodule projects along the posterior margin of the left thyroid lobe.  Limited evaluation of the upper abdomen shows fatty infiltration of the liver, borderline hepatomegaly and changes from a cholecystectomy.  There are degenerative changes of the visualized spine. No osteoblastic or osteolytic lesions.  IMPRESSION: 1. Loculated right pleural effusion with associated atelectatic/ consolidated lung. This has a complex  appearance. A few small bubbles of air within the presumed pleural fluid these is suspicion for a bronchopleural fistula. There is a small left pleural effusion with minor dependent subsegmental atelectasis. 2. Thoracentesis to obtain diagnostic fluid can be performed. Risks from this procedure are greater than for a typical free-flowing pleural effusion. Recommend consideration of placement of a pigtail drainage catheter within the largest loculation of pleural fluid to allow diagnostic acquisition of fluid and persistent drainage.   Electronically Signed   By: Amie Portland M.D.   On: 05/07/2013 15:58   Ct Angio Chest Pe W/cm &/or Wo Cm  04/29/2013   CLINICAL DATA:  Right abdominal pain with fever and chills for 4 days. Recently diagnosed with chikungunya disease.  EXAM: CT ANGIOGRAPHY CHEST  CT ABDOMEN AND PELVIS WITH CONTRAST  TECHNIQUE: Multidetector CT imaging of the chest was performed using the standard protocol during bolus administration of intravenous contrast. Multiplanar CT image reconstructions including MIPs were obtained to evaluate the vascular anatomy. Multidetector CT imaging of the abdomen and pelvis was performed using the standard protocol during bolus administration of intravenous contrast.  CONTRAST:  OMNIPAQUE IOHEXOL 350 MG/ML SOLN  COMPARISON:  None.  FINDINGS: CTA CHEST FINDINGS  There is satisfactory but not optimal opacification of the pulmonary arteries. No pulmonary emboli are demonstrated. There is mild atherosclerosis of the aorta and great vessels.  Small subcarinal and right hilar lymph nodes are noted, not pathologically enlarged. There is a small hiatal hernia. There is a small amount of pleural thickening on the right. No significant pleural or pericardial effusion is present. There is a probable  sebaceous cyst anterior to the lower sternum.  Mild pulmonary emphysematous changes are noted. There is mild atelectasis or scarring in the right lower lobe. No confluent  airspace opacity or endobronchial lesion is present.  CT ABDOMEN and PELVIS FINDINGS  There is mild extrahepatic biliary dilatation status post cholecystectomy. The liver and pancreas appear normal. The spleen and adrenal glands appear normal. The right kidney has a horizontal lie, but no focal cortical abnormality. There is no hydronephrosis.  There is aortoiliac atherosclerosis. No enlarged abdominal pelvic lymph nodes are seen. The subacute small bowel appear normal aside from a lipoma within the 3rd portion of the duodenum. The appendix appears normal. Sigmoid colon diverticular changes are present without surrounding inflammation.  There is no pelvic mass status post hysterectomy. The bladder appears normal. There is a suprapubic ventral hernia containing fat.  Compression deformities at L3 and L4 are unchanged from a prior lumbar spine radiographs. No acute osseous findings are seen.  Review of the MIP images confirms the above findings.  IMPRESSION: 1. No evidence of acute pulmonary embolism. 2. No acute chest findings identified. There is mild right basilar scarring or atelectasis with pleural thickening. No significant pleural effusion. 3. No acute abdominal pelvic findings. 4. Suprapubic ventral hernia containing fat, atherosclerosis, chronic lumbar compression deformities and sigmoid colon diverticulosis are noted.   Electronically Signed   By: Roxy Horseman M.D.   On: 04/29/2013 17:40   [place] Chest  05/07/2013   CLINICAL DATA:  Patient presents for ultrasound-guided thoracentesis of the right chest for a new area of loculated pleural fluid on the right.  EXAM: CHEST ULTRASOUND  COMPARISON:  Chest radiograph, 05/06/2013  FINDINGS: There is a loculated area pleural fluid at the right lung base that is mostly surrounded by lung that has internal echoes consistent with consolidated lung. It is unclear whether this fluid resides within the lung, within the visceral pleura, or whether this is a loculated  pleural effusion, or combination of both. For this reason, thoracentesis was not performed. The patient 1 ago a contrast enhanced chest CT for further delineation of this collection.  A small left pleural effusion was incidentally noted.  IMPRESSION: Loculated right pleural fluid versus an area of necrosis in consolidated right lung or combination of both.   Electronically Signed   By: Amie Portland M.D.   On: 05/07/2013 13:37   Ct Abdomen Pelvis W Contrast  04/29/2013   CLINICAL DATA:  Right abdominal pain with fever and chills for 4 days. Recently diagnosed with chikungunya disease.  EXAM: CT ANGIOGRAPHY CHEST  CT ABDOMEN AND PELVIS WITH CONTRAST  TECHNIQUE: Multidetector CT imaging of the chest was performed using the standard protocol during bolus administration of intravenous contrast. Multiplanar CT image reconstructions including MIPs were obtained to evaluate the vascular anatomy. Multidetector CT imaging of the abdomen and pelvis was performed using the standard protocol during bolus administration of intravenous contrast.  CONTRAST:  OMNIPAQUE IOHEXOL 350 MG/ML SOLN  COMPARISON:  None.  FINDINGS: CTA CHEST FINDINGS  There is satisfactory but not optimal opacification of the pulmonary arteries. No pulmonary emboli are demonstrated. There is mild atherosclerosis of the aorta and great vessels.  Small subcarinal and right hilar lymph nodes are noted, not pathologically enlarged. There is a small hiatal hernia. There is a small amount of pleural thickening on the right. No significant pleural or pericardial effusion is present. There is a probable sebaceous cyst anterior to the lower sternum.  Mild pulmonary emphysematous changes are  noted. There is mild atelectasis or scarring in the right lower lobe. No confluent airspace opacity or endobronchial lesion is present.  CT ABDOMEN and PELVIS FINDINGS  There is mild extrahepatic biliary dilatation status post cholecystectomy. The liver and pancreas  appear normal. The spleen and adrenal glands appear normal. The right kidney has a horizontal lie, but no focal cortical abnormality. There is no hydronephrosis.  There is aortoiliac atherosclerosis. No enlarged abdominal pelvic lymph nodes are seen. The subacute small bowel appear normal aside from a lipoma within the 3rd portion of the duodenum. The appendix appears normal. Sigmoid colon diverticular changes are present without surrounding inflammation.  There is no pelvic mass status post hysterectomy. The bladder appears normal. There is a suprapubic ventral hernia containing fat.  Compression deformities at L3 and L4 are unchanged from a prior lumbar spine radiographs. No acute osseous findings are seen.  Review of the MIP images confirms the above findings.  IMPRESSION: 1. No evidence of acute pulmonary embolism. 2. No acute chest findings identified. There is mild right basilar scarring or atelectasis with pleural thickening. No significant pleural effusion. 3. No acute abdominal pelvic findings. 4. Suprapubic ventral hernia containing fat, atherosclerosis, chronic lumbar compression deformities and sigmoid colon diverticulosis are noted.   Electronically Signed   By: Roxy Horseman M.D.   On: 04/29/2013 17:40   Dg Chest Port 1 View  05/14/2013   CLINICAL DATA:  Status post video-assisted thoracoscopy and right chest decortication.  EXAM: PORTABLE CHEST - 1 VIEW  COMPARISON:  05/13/2013  FINDINGS: Irregularly thickened interstitial markings, greater on the right, are without significant change. Blunting of the right costophrenic sulcus likely from pleural fluid versus pleural thickening is stable. Small bilateral pleural effusions are noted, also stable. No new lung opacities. No pneumothorax.  Right internal jugular central venous line is stable in well positioned.  IMPRESSION: 1. No change from the previous day's study.   Electronically Signed   By: Amie Portland M.D.   On: 05/14/2013 07:32   Dg Chest  Port 1 View  05/13/2013   CLINICAL DATA:  Empyema.  EXAM: PORTABLE CHEST - 1 VIEW  COMPARISON:  05/12/2013  FINDINGS: Status post removal of the remaining right chest tube. No pneumothorax.  Vascular congestion has improved. Persistent lung base opacity is noted likely combination of pleural effusions and atelectasis.  No pneumothorax. Right internal jugular central venous line is stable.  IMPRESSION: 1. Removal of the remaining right chest tube.  No pneumothorax. 2. Improved vascular congestion.   Electronically Signed   By: Amie Portland M.D.   On: 05/13/2013 08:10   Dg Chest Port 1 View  05/12/2013   CLINICAL DATA:  Decortication.  EXAM: PORTABLE CHEST - 1 VIEW  COMPARISON:  One-view chest 05/11/2013.  CT chest 05/07/2013.  FINDINGS: The heart size is exaggerated by low lung volumes. 1 of the right-sided chest tubes has been removed. There is no pneumothorax. Right-sided pleural scarring is stable. A right IJ line is stable in position. A left pleural effusion is stable. Bibasilar airspace disease remains. This likely reflects atelectasis. There is no new airspace disease. Degenerative changes are noted at the shoulders.  IMPRESSION: 1. Interval removal of 1 of the right-sided chest tube without evidence for pneumothorax. 2. The other chest tube is stable.  The right IJ line is stable. 3. Stable pulmonary vascular congestion and left pleural effusion. 4. Stable pleural thickening and scarring on the right.   Electronically Signed   By: Thayer Ohm  Mattern M.D.   On: 05/12/2013 07:15   Dg Chest Port 1 View  05/11/2013   CLINICAL DATA:  Status post VATS procedure  EXAM: PORTABLE CHEST - 1 VIEW  COMPARISON:  05/10/2013  FINDINGS: Two thoracostomy catheters are again noted on the right. No pneumothorax is noted. Interstitial changes are again noted within the right lung. A right central venous line is again seen and stable. Small effusion is noted on the left stable from the prior exam. The cardiac shadow is  unchanged.  IMPRESSION: When compare with the prior exam no significant interval change is noted.   Electronically Signed   By: Alcide Clever M.D.   On: 05/11/2013 07:28   Dg Chest Port 1 View  05/10/2013   CLINICAL DATA:  Status post VATS and wedge resection of right lower lobe. Right-sided pleural effusion/empyema.  EXAM: PORTABLE CHEST - 1 VIEW  COMPARISON:  One day prior  FINDINGS: Right IJ central line unchanged. Two right-sided chest tubes are unchanged in position. No pneumothorax.  Cardiomegaly with atherosclerosis in the transverse aorta. Probable small bilateral pleural effusions, as before.  Interstitial edema, accentuated by extremely low lung volumes. Greater on the right than left.  Improved inferolateral right base airspace disease with persistent left base atelectasis.  IMPRESSION: Minimal improvement in right base aeration.  Otherwise, similar appearance of the chest with pleural effusions, atelectasis, and interstitial edema.  Two right-sided chest tubes remain in place; no pneumothorax.   Electronically Signed   By: Jeronimo Greaves M.D.   On: 05/10/2013 07:34   Dg Chest Portable 1 View  05/09/2013   CLINICAL DATA:  Status post thoracoscopy with wedge resection of the right lower lobe.  EXAM: PORTABLE CHEST - 1 VIEW  COMPARISON:  April 29, 2013.  FINDINGS: The lungs are reasonably well inflated. There are 2 chest tubes in place on the right with the tips near the pulmonary apex. There is no evidence of a pneumothorax. There is consolidation of portions of the right mid and lower lung. There is no mediastinal shift. The right internal jugular venous catheter tip lies in the region of the proximal to mid SVC. The cardiac silhouette is mildly enlarged. The pulmonary vascularity is mildly prominent bilaterally. There is partial obscure a shin of the left hemidiaphragm which may reflect minimal subsegmental atelectasis.  IMPRESSION: 1. The patient is status post right-sided thoracoscopy with right  lower lobe wedge resection. Two chest tubes are in place with no evidence of a significant pneumothorax or pleural effusion. There is parenchymal consolidation in the right mid to lower lung. 2. The appearance of the heart and pulmonary vascularity suggesting low-grade CHF. 3. These results will be called to the ordering clinician or representative by the Radiologist Assistant, and communication documented in the PACS Dashboard.   Electronically Signed   By: David  [place]   On: 05/09/2013 14:37   Dg Abd Acute W/chest  05/06/2013   CLINICAL DATA:  Chronic abdominal pain.  EXAM: ACUTE ABDOMEN SERIES (ABDOMEN 2 VIEW & CHEST 1 VIEW)  COMPARISON:  04/29/2013.  FINDINGS: The cardiac silhouette, mediastinal and hilar contours are within normal limits and stable There is a new probable loculated pleural fluid collection on the right and a right lung infiltrate. This is unlikely a mass given the recent normal Chest x-ray and chest CT. The left lung is clear. The bony thorax is intact.  The abdominal radiographs demonstrate an unremarkable bowel gas pattern. There is scattered contrast throughout the colon. The soft tissue  shadows are maintained. The bony structures are intact. Stable vertebral augmentation changes at L3 and L4 new large probable loculated right pleural effusion and right lung infiltrate.  IMPRESSION: New probable loculated right pleural fluid collection and right lung infiltrate.  No acute abdominal findings. Contrast noted throughout the colon.   Electronically Signed   By: Loralie Champagne M.D.   On: 05/06/2013 19:46    Micro Results     Recent Results (from the past 240 hour(s))  CULTURE, BLOOD (ROUTINE X 2)     Status: None   Collection Time    05/08/13  6:46 PM      Result Value Range Status   Specimen Description BLOOD RIGHT ARM   Final   Special Requests BOTTLES DRAWN AEROBIC AND ANAEROBIC 10CC   Final   Culture  Setup Time     Final   Value: 05/09/2013 01:08     Performed at [facility]   Culture     Final   Value: NO GROWTH 5 DAYS     Performed at [facility]   Report Status 05/15/2013 FINAL   Final  CULTURE, BLOOD (ROUTINE X 2)     Status: None   Collection Time    05/08/13  6:54 PM      Result Value Range Status   Specimen Description BLOOD RIGHT HAND   Final   Special Requests BOTTLES DRAWN AEROBIC ONLY 4CC   Final   Culture  Setup Time     Final   Value: 05/09/2013 01:12     Performed at [facility]   Culture     Final   Value: NO GROWTH 5 DAYS     Performed at [facility]   Report Status 05/15/2013 FINAL   Final  FUNGUS CULTURE W SMEAR     Status: None   Collection Time    05/09/13  9:20 AM      Result Value Range Status   Specimen Description PLEURAL FLUID RIGHT   Final   Special Requests PT ON VANCO FLAGYL MAXIPIME   Final   Fungal Smear     Final   Value: NO YEAST OR FUNGAL ELEMENTS SEEN     Performed at [facility]   Culture     Final   Value: CULTURE IN PROGRESS FOR FOUR WEEKS     Performed at [facility]   Report Status PENDING   Incomplete  AFB CULTURE WITH SMEAR     Status: None   Collection Time    05/09/13  9:20 AM      Result Value Range Status   Specimen Description PLEURAL FLUID RIGHT   Final   Special Requests PT ON VANCO FLAGYL MAXIPIME   Final   ACID FAST SMEAR     Final   Value: NO ACID FAST BACILLI SEEN     Performed at [facility]   Culture     Final   Value: CULTURE WILL BE EXAMINED FOR 6 WEEKS BEFORE ISSUING A FINAL REPORT     Performed at [facility]   Report Status PENDING   Incomplete  BODY FLUID CULTURE     Status: None   Collection Time    05/09/13  9:20 AM      Result Value Range Status   Specimen Description PLEURAL FLUID RIGHT   Final   Special Requests PT ON VANCO FLAGYL MAXIPIME   Final   Gram Stain  Final   Value: ABUNDANT WBC PRESENT,BOTH PMN AND MONONUCLEAR     ABUNDANT GRAM POSITIVE COCCI IN CLUSTERS     Gram Stain Report Called  to,Read Back By and Verified With: Gram Stain Report Called to,Read Back By and Verified With:  PUJA PATEL @0747  ON 05/10/13 BY SMIAS     Performed at [facility]   Culture     Final   Value: ABUNDANT GROUP B STREP(S.AGALACTIAE)ISOLATED     Note: This organism DOES NOT demonstrate inducible Clindamycin resistance in vitro.     Performed at [facility]   Report Status 05/15/2013 FINAL   Final   Organism ID, Bacteria GROUP B STREP(S.AGALACTIAE)ISOLATED   Final  FUNGUS CULTURE W SMEAR     Status: None   Collection Time    05/09/13  9:26 AM      Result Value Range Status   Specimen Description PLEURAL FLUID RIGHT   Final   Special Requests PT ON VANCO FLAGYL MAXIPIME   Final   Fungal Smear     Final   Value: NO YEAST OR FUNGAL ELEMENTS SEEN     Performed at [facility]   Culture     Final   Value: CULTURE IN PROGRESS FOR FOUR WEEKS     Performed at [facility]   Report Status PENDING   Incomplete  AFB CULTURE WITH SMEAR     Status: None   Collection Time    05/09/13  9:26 AM      Result Value Range Status   Specimen Description PLEURAL FLUID RIGHT   Final   Special Requests PT ON VANCO FLAGYL MAXIPIME   Final   ACID FAST SMEAR     Final   Value: NO ACID FAST BACILLI SEEN     Performed at [facility]   Culture     Final   Value: CULTURE WILL BE EXAMINED FOR 6 WEEKS BEFORE ISSUING A FINAL REPORT     Performed at [facility]   Report Status PENDING   Incomplete  BODY FLUID CULTURE     Status: None   Collection Time    05/09/13  9:26 AM      Result Value Range Status   Specimen Description PLEURAL FLUID RIGHT   Final   Special Requests PT ON VANCO FLAGYL MAXIPIME   Final   Gram Stain     Final   Value: RARE WBC PRESENT, PREDOMINANTLY PMN     NO ORGANISMS SEEN     Performed at [facility]   Culture     Final   Value: RARE GROUP B STREP(S.AGALACTIAE)ISOLATED     Note: SUSCEPTIBILITIES PERFORMED ON PREVIOUS CULTURE  WITHIN THE LAST 5 DAYS.     Performed at [facility]   Report Status 05/15/2013 FINAL   Final  AFB CULTURE WITH SMEAR     Status: None   Collection Time    05/09/13  9:39 AM      Result Value Range Status   Specimen Description TISSUE PLEURAL   Final   Special Requests RIGHT PLEURAL PEEL PT ON VANCO FLAGYL MAXIPIME   Final   ACID FAST SMEAR     Final   Value: NO ACID FAST BACILLI SEEN     Performed at [facility]   Culture     Final   Value: CULTURE WILL BE EXAMINED FOR 6 WEEKS BEFORE ISSUING A FINAL REPORT     Performed at  [facility]   Report Status PENDING   Incomplete  FUNGUS CULTURE W SMEAR     Status: None   Collection Time    05/09/13  9:39 AM      Result Value Range Status   Specimen Description TISSUE PLEURAL   Final   Special Requests RIGHT PLEURAL PEEL PT ON VANCO FLAGYL MAXIPIME   Final   Fungal Smear     Final   Value: NO YEAST OR FUNGAL ELEMENTS SEEN     Performed at [facility]   Culture     Final   Value: CULTURE IN PROGRESS FOR FOUR WEEKS     Performed at [facility]   Report Status PENDING   Incomplete  TISSUE CULTURE     Status: None   Collection Time    05/09/13  9:39 AM      Result Value Range Status   Specimen Description TISSUE PLEURAL   Final   Special Requests RIGHT PLEURAL PEEL PT ON VANCO FLAGYL MAXIPIME   Final   Gram Stain     Final   Value: RARE WBC PRESENT,BOTH PMN AND MONONUCLEAR     NO ORGANISMS SEEN     Performed at [facility]   Culture     Final   Value: RARE GROUP B STREP(S.AGALACTIAE)ISOLATED     Note: TESTING AGAINST S. AGALACTIAE NOT ROUTINELY PERFORMED DUE TO PREDICTABILITY OF AMP/PEN/VAN SUSCEPTIBILITY.     Performed at [facility]   Report Status 05/13/2013 FINAL   Final  ANAEROBIC CULTURE     Status: None   Collection Time    05/09/13  9:39 AM      Result Value Range Status   Specimen Description TISSUE PLEURAL   Final   Special Requests RIGHT PLEURAL PEEL PT ON  VANCO FLAYGYL MAXIPIME   Final   Gram Stain     Final   Value: RARE WBC PRESENT,BOTH PMN AND MONONUCLEAR     NO ORGANISMS SEEN     Performed at [facility]   Culture     Final   Value: NO ANAEROBES ISOLATED     Performed at [facility]   Report Status 05/14/2013 FINAL   Final  AFB CULTURE WITH SMEAR     Status: None   Collection Time    05/09/13  9:52 AM      Result Value Range Status   Specimen Description TISSUE PLEURAL   Final   Special Requests PARIETAL PLEURAL PEEL PT ON VANCO FLAGYL MAXIPIME   Final   ACID FAST SMEAR     Final   Value: NO ACID FAST BACILLI SEEN     Performed at [facility]   Culture     Final   Value: CULTURE WILL BE EXAMINED FOR 6 WEEKS BEFORE ISSUING A FINAL REPORT     Performed at [facility]   Report Status PENDING   Incomplete  FUNGUS CULTURE W SMEAR     Status: None   Collection Time    05/09/13  9:52 AM      Result Value Range Status   Specimen Description TISSUE PLEURAL   Final   Special Requests PARIETAL PLEURAL PEEL PT ON VANCO FLAGYL MAXIPIME   Final   Fungal Smear     Final   Value: NO YEAST OR FUNGAL ELEMENTS SEEN     Performed at [facility]   Culture     Final   Value:  CULTURE IN PROGRESS FOR FOUR WEEKS     Performed at [facility]   Report Status PENDING   Incomplete  ANAEROBIC CULTURE     Status: None   Collection Time    05/09/13  9:52 AM      Result Value Range Status   Specimen Description TISSUE PLEURAL   Final   Special Requests PARIETAL PLEURAL PEEL PT ON VANCO FLAGYL MAXIPIME   Final   Gram Stain     Final   Value: RARE WBC PRESENT,BOTH PMN AND MONONUCLEAR     NO ORGANISMS SEEN     Performed at [facility]   Culture     Final   Value: NO ANAEROBES ISOLATED     Performed at [facility]   Report Status 05/14/2013 FINAL   Final  TISSUE CULTURE     Status: None   Collection Time    05/09/13  9:52 AM      Result Value Range Status   Specimen  Description TISSUE PLEURAL   Final   Special Requests PARIETAL PLEURAL PEEL PT ON VANCO FLAGYL MAXIPIME   Final   Gram Stain     Final   Value: RARE WBC PRESENT,BOTH PMN AND MONONUCLEAR     NO ORGANISMS SEEN     Performed at [facility]   Culture     Final   Value: MODERATE GROUP B STREP(S.AGALACTIAE)ISOLATED     Note: TESTING AGAINST S. AGALACTIAE NOT ROUTINELY PERFORMED DUE TO PREDICTABILITY OF AMP/PEN/VAN SUSCEPTIBILITY.     Performed at [facility]   Report Status 05/13/2013 FINAL   Final  AFB CULTURE WITH SMEAR     Status: None   Collection Time    05/09/13 11:06 AM      Result Value Range Status   Specimen Description TISSUE LUNG RIGHT   Final   Special Requests RIGHT LOWER LOBE PT ON VANCOMYCIN,FLAGYL,MAXIPIME   Final   ACID FAST SMEAR     Final   Value: NO ACID FAST BACILLI SEEN     Performed at [facility]   Culture     Final   Value: CULTURE WILL BE EXAMINED FOR 6 WEEKS BEFORE ISSUING A FINAL REPORT     Performed at [facility]   Report Status PENDING   Incomplete  FUNGUS CULTURE W SMEAR     Status: None   Collection Time    05/09/13 11:06 AM      Result Value Range Status   Specimen Description TISSUE LUNG RIGHT   Final   Special Requests RIGHT LOWER LOBE PT ON VANCOMYCIN,FLAGYL,MAXIPIME   Final   Fungal Smear     Final   Value: NO YEAST OR FUNGAL ELEMENTS SEEN     Performed at [facility]   Culture     Final   Value: CULTURE IN PROGRESS FOR FOUR WEEKS     Performed at [facility]   Report Status PENDING   Incomplete  ANAEROBIC CULTURE     Status: None   Collection Time    05/09/13 11:06 AM      Result Value Range Status   Specimen Description TISSUE LUNG RIGHT   Final   Special Requests RIGHT LOWER LOBE PT ON VANCOMYCIN,FLAGYL,MAXIPIME   Final   Gram Stain     Final   Value: RARE WBC PRESENT,BOTH PMN AND MONONUCLEAR     NO ORGANISMS SEEN     Performed at [facility]  Culture     Final   Value:  NO ANAEROBES ISOLATED     Performed at [facility]   Report Status 05/14/2013 FINAL   Final  TISSUE CULTURE     Status: None   Collection Time    05/09/13 11:06 AM      Result Value Range Status   Specimen Description TISSUE LUNG RIGHT   Final   Special Requests RIGHT LOWER LOBE PT ON VANCOMYCIN,FLAGYL,MAXIPIME   Final   Gram Stain     Final   Value: RARE WBC PRESENT,BOTH PMN AND MONONUCLEAR     NO ORGANISMS SEEN     Performed at [facility]   Culture     Final   Value: RARE GROUP B STREP(S.AGALACTIAE)ISOLATED     Note: TESTING AGAINST S. AGALACTIAE NOT ROUTINELY PERFORMED DUE TO PREDICTABILITY OF AMP/PEN/VAN SUSCEPTIBILITY.     Performed at [facility]   Report Status 05/13/2013 FINAL   Final     History of present illness and  Hospital Course:     Kindly see H&P for history of present illness and admission details, please review complete Labs, Consult reports and Test reports for all details in brief Kelli Summers, is a 57 y.o. female, patient with history of  diabetes mellitus type 2 who is now insulin-dependent, hypertension, anemia of chronic disease, depression, anxiety, chronic pain who was admitted to the hospital initially at [facility] with chief complaints of generalized fevers body aches, she had recently traveled from [place]. So workup was consistent with committed for pneumonia with evidence of empyema.   Subsequently transferred to [facility] and was seen by both cardiothoracic surgery and pulmonary critical care, she underwent supportive treatment with IV antibiotics based on her culture and sensitivity reports which showed both pleural fluid and blood cultures to be positive for Streptococcus intermedius, this was penicillin and Rocephin sensitive, she underwent VATS procedure, subsequently chest tube placement which was eventually removed several days ago. She now has mild pleural effusions bilaterally, she has been receiving IV  antibiotics which are Rocephin after consultations with ID physician Dr. Ninetta Lights over the phone, she has a PICC line in the left arm and the plan is to provide her with IV antibiotics for the next few weeks with close outpatient followup with pulmonary and PCP. She is clinically much better and completely symptom-free now. I will also request one time outpatient followup with ID post discharge.     Patient also had acute worsening of her chronic anemia due to multiple blood draws, she will get 1 unit of packed RBC transfused today thereafter she will be discharged home. She has no evidence of melena or blood in stool.     For her anxiety and chronic pain she will continue her home medications unchanged.     Diabetes mellitus type 2 she will resume her home regimen.         Today   Subjective:   Kelli Summers today has no headache,no chest abdominal pain,no new weakness tingling or numbness, feels much better wants to go home today.    Objective:   Blood pressure 134/63, pulse 109, temperature 98.4 F (36.9 C), temperature source Oral, resp. rate 18, height 5\' 5"  (1.651 m), weight 88.8 kg (195 lb 12.3 oz), SpO2 100.00%.   Intake/Output Summary (Last 24 hours) at 05/18/13 1128 Last data filed at 05/18/13 0200  Gross per 24 hour  Intake    960 ml  Output  1950 ml  Net   -990 ml    Exam Awake Alert, Oriented *3, No new F.N deficits, Normal affect [place].AT,PERRAL Supple Neck,No JVD, No cervical lymphadenopathy appriciated.  Symmetrical Chest wall movement, Good air movement bilaterally, CTAB RRR,No Gallops,Rubs or new Murmurs, No Parasternal Heave +ve B.Sounds, Abd Soft, Non tender, No organomegaly appriciated, No rebound -guarding or rigidity. No Cyanosis, Clubbing or edema, No new Rash or bruise  Data Review   CBC w Diff: Lab Results  Component Value Date   WBC 11.8* 05/18/2013   HGB 7.0* 05/18/2013   HCT 21.6* 05/18/2013   PLT 771* 05/18/2013   LYMPHOPCT 5*  05/06/2013   BANDSPCT 0 05/06/2013   MONOPCT 0* 05/06/2013   EOSPCT 1 05/06/2013   BASOPCT 0 05/06/2013    CMP: Lab Results  Component Value Date   NA 135 05/17/2013   K 3.8 05/17/2013   CL 98 05/17/2013   CO2 28 05/17/2013   BUN 12 05/17/2013   CREATININE 1.09 05/17/2013   PROT 5.3* 05/12/2013   ALBUMIN 1.5* 05/12/2013   BILITOT 0.4 05/12/2013   ALKPHOS 138* 05/12/2013   AST 14 05/12/2013   ALT 8 05/12/2013  .   Total Time in preparing paper work, data evaluation and todays exam - 35 minutes  Leroy Sea M.D on 05/18/2013 at 11:28 AM  [facility] Office  [phone]

## 2013-05-19 LAB — TYPE AND SCREEN
ABO/RH(D): O POS
Antibody Screen: NEGATIVE
Unit division: 0

## 2013-05-21 ENCOUNTER — Telehealth: Payer: Self-pay | Admitting: Internal Medicine

## 2013-05-21 NOTE — Telephone Encounter (Signed)
Follow up with [facility], MD. Schedule an appointment as soon as possible for a visit in 1 week   Since [facility] nor [facility] has openings pt is scheduled to come in 05/23/13 for HFU. She was not able to go to [facility] since pt has many other openings. Nothing further needed

## 2013-05-22 ENCOUNTER — Encounter: Payer: Self-pay | Admitting: Thoracic Surgery (Cardiothoracic Vascular Surgery)

## 2013-05-23 ENCOUNTER — Ambulatory Visit (INDEPENDENT_AMBULATORY_CARE_PROVIDER_SITE_OTHER): Payer: 59 | Admitting: Internal Medicine

## 2013-05-23 ENCOUNTER — Inpatient Hospital Stay (INDEPENDENT_AMBULATORY_CARE_PROVIDER_SITE_OTHER): Payer: 59 | Admitting: Internal Medicine

## 2013-05-23 ENCOUNTER — Encounter: Payer: Self-pay | Admitting: Internal Medicine

## 2013-05-23 ENCOUNTER — Ambulatory Visit (INDEPENDENT_AMBULATORY_CARE_PROVIDER_SITE_OTHER)
Admission: RE | Admit: 2013-05-23 | Discharge: 2013-05-23 | Disposition: A | Discharge status: Home / Self Care | Payer: 59 | Source: Ambulatory Visit | Attending: Internal Medicine | Admitting: Internal Medicine

## 2013-05-23 VITALS — BP 114/80 | HR 111 | Temp 98.0°F | Ht 65.0 in | Wt 171.8 lb

## 2013-05-23 DIAGNOSIS — J869 Pyothorax without fistula: Secondary | ICD-10-CM

## 2013-05-23 NOTE — Assessment & Plan Note (Signed)
05/10/13 Right video-assisted thoracoscopy, drainage of empyema,  visceral and parietal pleural decortication.   I had an extended discussion with the patient today lasting 15 to 20 minutes of a 25 minute visit on the following issues:   Reviewed hosp course and dx of cap complicated by empyema emphasizing it takes time to recover full function but unless worse breathing or fever no need to change rx and should complete abx as planned.   See instructions for specific recommendations which were reviewed directly with the patient who was given a copy with highlighter outlining the key components.

## 2013-05-23 NOTE — Progress Notes (Signed)
Quick Note:  LMTCB ______ 

## 2013-05-23 NOTE — Patient Instructions (Addendum)
You had a serious lung infection  which spilled into the space around the lung and required drainage and antibiotics per ID > finish them.  Please remember to go to the x-ray department downstairs for your tests - we will call you with the results when they are available.  Please schedule a follow up office visit in 2 weeks, sooner if needed to See [facility] with cxr on return

## 2013-05-23 NOTE — Progress Notes (Signed)
Subjective:     Patient ID: Kelli Summers, female   DOB: 1956/05/01  MRN: [id number]  HPI  20 yowf quit smoking 04/2012 admitted initially [facility] and transferred to [facility]  Admission date: 04/29/2013   Discharge Date: 05/18/2013    Discharge Diagnosis community-acquired pneumonia with empyema and bacteremia strep species HCAP (healthcare-associated pneumonia)  Hypokalemia  Loculated pleural effusion  05/10/13 Right video-assisted thoracoscopy, drainage of empyema,  visceral and parietal pleural decortication. Acute respiratory failure with hypoxia  Encephalopathy acute  Empyema  Anemia, blood loss  Bacteremia due to Streptococcus  Empyema lung  Physical deconditioning  Hx patient with history of diabetes mellitus type 2 who is now insulin-dependent, hypertension, anemia of chronic disease, depression, anxiety, chronic pain who was admitted to the hospital initially at [facility] with chief complaints of generalized fevers body aches, she had recently traveled from [place]. So workup was consistent with committed for pneumonia with evidence of empyema.  Subsequently transferred to [facility] and was seen by both cardiothoracic surgery and pulmonary critical care, she underwent supportive treatment with IV antibiotics based on her culture and sensitivity reports which showed both pleural fluid and blood cultures to be positive for Streptococcus intermedius, this was penicillin and Rocephin sensitive, she underwent VATS procedure, subsequently chest tube placement which was eventually removed several days ago. She now has mild pleural effusions bilaterally, she has been receiving IV antibiotics which are Rocephin after consultations with ID physician Dr. Ninetta Lights over the phone, she has a PICC line in the left arm and the plan is to provide her with IV antibiotics for the next few weeks with close outpatient followup with pulmonary and PCP. She is clinically much better and completely  symptom-free now. I will also request one time outpatient followup with ID post discharge.   05/23/2013 1st [facility] office visit/ Kelli Summers post hosp f/u cc back pain worse with deep breathing since surgery but overall improving and only using percocet twice daily, min cough andno purulent sputum, mild doe and mostly just fatigue, no fever or aches  No obvious day to day or daytime variabilty or assoc  r chest tightness, subjective wheeze overt sinus or hb symptoms. No unusual exp hx or h/o childhood pna/ asthma or knowledge of premature birth.  Sleeping ok without nocturnal  or early am exacerbation  of respiratory  c/o's or need for noct saba. Also denies any obvious fluctuation of symptoms with weather or environmental changes or other aggravating or alleviating factors except as outlined above   Current Medications, Allergies, Complete Past Medical History, Past Surgical History, Family History, and Social History were reviewed in [employer] record.  ROS  The following are not active complaints unless bolded sore throat, dysphagia, dental problems, itching, sneezing,  nasal congestion or excess/ purulent secretions, ear ache,   fever, chills, sweats, unintended wt loss, pleuritic CP or exertional cp, hemoptysis,  orthopnea pnd or leg swelling, presyncope, palpitations, heartburn, abdominal pain, anorexia, nausea, vomiting, diarrhea  or change in bowel or urinary habits, change in stools or urine, dysuria,hematuria,  rash, arthralgias, visual complaints, headache, numbness weakness or ataxia or problems with walking or coordination,  change in mood/affect or memory.          Review of Systems     Objective:   Physical Exam   amb wf nad  Wt Readings from Last 3 Encounters:  05/23/13 171 lb 12.8 oz (77.928 kg)  05/14/13 195 lb 12.3 oz (88.8 kg)  05/14/13  195 lb 12.3 oz (88.8 kg)     HEENT: nl dentition, turbinates, and orophanx. Nl external ear canals  without cough reflex   NECK :  without JVD/Nodes/TM/ nl carotid upstrokes bilaterally   LUNGS: no acc muscle use, decreased bs both bases with dullness R > L    CV:  RRR  no s3 or murmur or increase in P2, no edema   ABD:  soft and nontender with nl excursion in the supine position. No bruits or organomegaly, bowel sounds nl  MS:  warm without deformities, calf tenderness, cyanosis or clubbing  SKIN: warm and dry without lesions    NEURO:  alert, approp, no deficits    CXR  05/23/2013 :  1. Decreasing left pleural effusion, now small. 2. Mild decrease in loculated right basilar pleural effusion. 3. Right upper extremity PICC.      Assessment:

## 2013-05-25 NOTE — Progress Notes (Signed)
Quick Note:  LMTCB ______ 

## 2013-05-28 ENCOUNTER — Telehealth: Payer: Self-pay

## 2013-05-28 NOTE — Telephone Encounter (Signed)
**Note De-Identified  Obfuscation** LMTCB. Pt is scheduled to see Dr Myrtis Ser for a post hospital visit on 05/30/13. At discharge the pt was advised to f/u with several MD's but it is unclear why the patient needs a cardiac f/u. Also, if pt does need a f/u with Cardiology, she lives in [place] and may prefer being seen in our [place] office.

## 2013-05-28 NOTE — Progress Notes (Signed)
Quick Note:  Spoke with pt and notified of results per Dr. Wert. Pt verbalized understanding and denied any questions.  ______ 

## 2013-05-29 ENCOUNTER — Encounter: Payer: Self-pay | Admitting: Thoracic Surgery (Cardiothoracic Vascular Surgery)

## 2013-05-30 ENCOUNTER — Ambulatory Visit (INDEPENDENT_AMBULATORY_CARE_PROVIDER_SITE_OTHER): Payer: 59 | Admitting: Cardiology

## 2013-05-30 ENCOUNTER — Encounter: Payer: Self-pay | Admitting: Cardiology

## 2013-05-30 VITALS — BP 102/64 | HR 110 | Ht 65.0 in | Wt 163.0 lb

## 2013-05-30 DIAGNOSIS — J869 Pyothorax without fistula: Secondary | ICD-10-CM

## 2013-05-30 DIAGNOSIS — Z8679 Personal history of other diseases of the circulatory system: Secondary | ICD-10-CM

## 2013-05-30 DIAGNOSIS — R000 Tachycardia, unspecified: Secondary | ICD-10-CM

## 2013-05-30 DIAGNOSIS — B955 Unspecified streptococcus as the cause of diseases classified elsewhere: Secondary | ICD-10-CM

## 2013-05-30 DIAGNOSIS — A491 Streptococcal infection, unspecified site: Secondary | ICD-10-CM

## 2013-05-30 DIAGNOSIS — R7881 Bacteremia: Secondary | ICD-10-CM

## 2013-05-30 DIAGNOSIS — I471 Supraventricular tachycardia: Secondary | ICD-10-CM

## 2013-05-30 DIAGNOSIS — I498 Other specified cardiac arrhythmias: Secondary | ICD-10-CM

## 2013-05-30 MED ORDER — METOPROLOL SUCCINATE 12.5 MG HALF TABLET: 12.5000 mg | ORAL_TABLET | Freq: Every day | ORAL | Status: DC

## 2013-05-30 NOTE — Progress Notes (Signed)
HPI  The patient is seen today for new patient cardiac evaluation. She was recently hospitalized at [facility]. She had a prolonged hospitalization with community-acquired pneumonia, empyema, and bacteremia. She was not seen by cardiology during the hospitalization. Eventually she improved and she was discharged home. She has been seen back for outpatient evaluation by the pulmonary team.  He was noted during the hospitalization that she had persistent sinus tachycardia. It was felt that it would be appropriate for her to followup with cardiology as an outpatient to assess this further. The patient does remember that she has had a relatively high rate at the time of Dr. visits in the past.  As part of today's evaluation I have reviewed the extensive hospital records from her recent admission. I reviewed the H&P and the discharge summary. I will carefully to see if she had had an echo during the hospitalization. She had not had an echo. EKGs showed sinus tachycardia.  The patient is here today. She is weak but improving. She is slowly recovering from the significant illness that she had. She is here with a family member.  Allergies  Allergen Reactions  . Ciprofloxacin Itching  . Hydrocodone-Acetaminophen Itching  . Penicillins Itching    Tolerates Ceftin  . Percocet [Oxycodone-Acetaminophen] Itching    Current Outpatient Prescriptions  Medication Sig Dispense Refill  . dextrose 5 % SOLN 50 mL with cefTRIAXone 1 G SOLR 1 g Inject 1 g into the vein daily. 21 day supply then switched to oral antibiotic.  21 cartridge  0  . diazepam (VALIUM) 10 MG tablet Take 0.5 tablets (5 mg total) by mouth every 12 (twelve) hours as needed for anxiety.  30 tablet  0  . DULoxetine (CYMBALTA) 60 MG capsule Take 60 mg by mouth daily.      . insulin detemir (LEVEMIR) 100 UNIT/ML injection Inject 0.2 mLs (20 Units total) into the skin at bedtime.  10 mL  12  . insulin lispro (HUMALOG KWIKPEN) 100 UNIT/ML SOPN Inject  6-10 Units into the skin 3 (three) times daily with meals.      Marland Kitchen oxyCODONE-acetaminophen (ROXICET) 5-325 MG per tablet Take 1 tablet by mouth every 8 (eight) hours as needed for severe pain.  15 tablet  0  . metoprolol succinate (TOPROL-XL) 12.5 mg TB24 24 hr tablet Take 0.5 tablets (12.5 mg total) by mouth daily.  15 tablet  3   No current facility-administered medications for this visit.    History   Social History  . Marital Status: Divorced    Spouse Name: N/A    Number of Children: N/A  . Years of Education: N/A   Occupational History  . Not on file.   Social History Main Topics  . Smoking status: Former Smoker -- 0.50 packs/day for 40 years    Types: Cigarettes    Quit date: 04/28/2012  . Smokeless tobacco: Current User  . Alcohol Use: Yes  . Drug Use: 2.00 per week    Special: Marijuana  . Sexual Activity: Not on file   Other Topics Concern  . Not on file   Social History Narrative  . No narrative on file    Family History  Problem Relation Age of Onset  . Emphysema Maternal Grandfather     smoked  . Breast cancer Maternal Aunt     Past Medical History  Diagnosis Date  . Diabetes mellitus   . History of stress test 01/2010    normal myoview stress test  .  Chronic back pain   . Depression   . Chronic knee pain   . Hypertension   . Sinus tachycardia     Past Surgical History  Procedure Laterality Date  . Back surgery    . Cholecystectomy    . Abdominal hysterectomy    . Video assisted thoracoscopy (vats)/decortication Right 05/09/2013    Procedure: VIDEO ASSISTED THORACOSCOPY (VATS)/DECORTICATION;  Surgeon: Loreli Slot, MD;  Location: [facility] OR;  Service: Thoracic;  Laterality: Right;  . Wedge resection Right 05/09/2013    Procedure: WEDGE RESECTION RIGHT LOWER LOBE;  Surgeon: Loreli Slot, MD;  Location: [facility] OR;  Service: Thoracic;  Laterality: Right;    Patient Active Problem List   Diagnosis Date Noted  . Sinus tachycardia   .  Physical deconditioning 05/15/2013  . Bacteremia due to Streptococcus 05/11/2013  . Empyema of right pleural space 05/11/2013  . Encephalopathy acute 05/10/2013  . Empyema 05/10/2013  . Anemia, blood loss 05/10/2013  . Acute respiratory failure with hypoxia 05/09/2013  . Loculated pleural effusion 05/08/2013  . HCAP (healthcare-associated pneumonia) 05/06/2013  . Hypokalemia 05/06/2013  . Hyperglycemia without ketosis 04/30/2013  . Diabetes mellitus type 2, uncontrolled 04/30/2013  . DKA (diabetic ketoacidoses) 04/30/2013  . Hyperglycemia 04/30/2013  . Hypotension, unspecified 04/30/2013  . SHOULDER PAIN 03/02/2007  . DIABETES 02/14/2007  . HIGH BLOOD PRESSURE 02/14/2007    ROS   Patient denies fever, chills, headache, sweats, rash, change in vision, change in hearing, chest pain, cough, nausea vomiting, urinary symptoms. All other systems are reviewed and are negative.  PHYSICAL EXAM  The patient is weak but stable. She's here with a family member. She is oriented to person time and place. Affect is normal. There is no jugular venous distention. Lungs are clear. Respiratory effort is nonlabored. Breath sounds are distant. Cardiac exam reveals S1 and S2. The abdomen is soft. There is no peripheral edema. There no musculoskeletal deformities. There are no skin rashes. There is a PICC line in her right upper extremity. Today her resting rate is 105.  Filed Vitals:   05/30/13 1134  BP: 102/64  Pulse: 110  Height: 5\' 5"  (1.651 m)  Weight: 163 lb (73.936 kg)   I have reviewed EKGs from the hospital. She has sinus rhythm with sinus tachycardia.  ASSESSMENT & PLAN

## 2013-05-30 NOTE — Patient Instructions (Addendum)
Your physician has requested that you have an echocardiogram. Echocardiography is a painless test that uses sound waves to create images of your heart. It provides your doctor with information about the size and shape of your heart and how well your heart's chambers and valves are working. This procedure takes approximately one hour. There are no restrictions for this procedure.  Your physician has recommended you make the following change in your medication: start taking Metoprolol 12.5 mg daily  Your physician recommends that you schedule a follow-up appointment in: 07/02/13

## 2013-05-30 NOTE — Assessment & Plan Note (Signed)
This was treated with a VATS procedure with drainage. There was visceral and parietal pleural decortication.

## 2013-05-30 NOTE — Telephone Encounter (Signed)
Pt had post hosp OV with Dr Myrtis Ser today.

## 2013-05-30 NOTE — Assessment & Plan Note (Signed)
It is felt that this was due to the patient's pneumonia and empyema. There was no clinical evidence of endocarditis. However the patient did not have an echo in the hospital.

## 2013-05-30 NOTE — Assessment & Plan Note (Signed)
The patient has mild persistent sinus tachycardia. Thyroid function in the hospital was normal. There are no other major factors causing this at this time. Historically she has a mild increase in her resting heart rate. I've chosen to recommend a 2-D echo to be sure that we know what her LV function is and to assess her valves. I've also recommended the addition of a very small dose of metoprolol at 12.5 mg daily. I will then see her for followup. At this point I suspect that she has benign resting sinus tachycardia. However we need to be sure that there is no other etiology.

## 2013-05-30 NOTE — Assessment & Plan Note (Signed)
Blood pressure is not high at this time. It is on the low side. No further workup

## 2013-05-31 ENCOUNTER — Other Ambulatory Visit: Payer: Self-pay | Admitting: *Deleted

## 2013-05-31 ENCOUNTER — Ambulatory Visit (HOSPITAL_COMMUNITY)
Admission: RE | Admit: 2013-05-31 | Discharge: 2013-05-31 | Disposition: A | Discharge status: Home / Self Care | Payer: 59 | Source: Ambulatory Visit | Attending: Cardiology | Admitting: Cardiology

## 2013-05-31 DIAGNOSIS — B955 Unspecified streptococcus as the cause of diseases classified elsewhere: Secondary | ICD-10-CM

## 2013-05-31 DIAGNOSIS — E119 Type 2 diabetes mellitus without complications: Secondary | ICD-10-CM | POA: Insufficient documentation

## 2013-05-31 DIAGNOSIS — I10 Essential (primary) hypertension: Secondary | ICD-10-CM | POA: Insufficient documentation

## 2013-05-31 DIAGNOSIS — J869 Pyothorax without fistula: Secondary | ICD-10-CM

## 2013-05-31 DIAGNOSIS — R000 Tachycardia, unspecified: Secondary | ICD-10-CM

## 2013-05-31 DIAGNOSIS — I498 Other specified cardiac arrhythmias: Secondary | ICD-10-CM | POA: Insufficient documentation

## 2013-05-31 DIAGNOSIS — J90 Pleural effusion, not elsewhere classified: Secondary | ICD-10-CM

## 2013-05-31 DIAGNOSIS — I471 Supraventricular tachycardia: Secondary | ICD-10-CM

## 2013-05-31 NOTE — Progress Notes (Signed)
*  PRELIMINARY RESULTS* Echocardiogram 2D Echocardiogram has been performed.  Kelli Summers 05/31/2013, 11:05 AM

## 2013-06-01 ENCOUNTER — Encounter: Payer: Self-pay | Admitting: Cardiology

## 2013-06-01 DIAGNOSIS — R9430 Abnormal result of cardiovascular function study, unspecified: Secondary | ICD-10-CM | POA: Insufficient documentation

## 2013-06-04 ENCOUNTER — Ambulatory Visit (INDEPENDENT_AMBULATORY_CARE_PROVIDER_SITE_OTHER): Payer: 59 | Admitting: Internal Medicine

## 2013-06-04 ENCOUNTER — Encounter: Payer: Self-pay | Admitting: Internal Medicine

## 2013-06-04 VITALS — BP 105/69 | HR 99 | Temp 97.9°F | Wt 165.0 lb

## 2013-06-04 DIAGNOSIS — J869 Pyothorax without fistula: Secondary | ICD-10-CM

## 2013-06-05 ENCOUNTER — Encounter: Payer: Self-pay | Admitting: Thoracic Surgery (Cardiothoracic Vascular Surgery)

## 2013-06-05 ENCOUNTER — Ambulatory Visit (INDEPENDENT_AMBULATORY_CARE_PROVIDER_SITE_OTHER): Payer: Self-pay | Admitting: Thoracic Surgery (Cardiothoracic Vascular Surgery)

## 2013-06-05 ENCOUNTER — Ambulatory Visit
Admission: RE | Admit: 2013-06-05 | Discharge: 2013-06-05 | Disposition: A | Discharge status: Home / Self Care | Payer: 59 | Source: Ambulatory Visit | Attending: Thoracic Surgery (Cardiothoracic Vascular Surgery) | Admitting: Thoracic Surgery (Cardiothoracic Vascular Surgery)

## 2013-06-05 ENCOUNTER — Telehealth: Payer: Self-pay | Admitting: *Deleted

## 2013-06-05 VITALS — BP 101/70 | HR 96 | Resp 20 | Ht 65.0 in | Wt 165.0 lb

## 2013-06-05 DIAGNOSIS — J869 Pyothorax without fistula: Secondary | ICD-10-CM

## 2013-06-05 DIAGNOSIS — J90 Pleural effusion, not elsewhere classified: Secondary | ICD-10-CM

## 2013-06-05 DIAGNOSIS — Z09 Encounter for follow-up examination after completed treatment for conditions other than malignant neoplasm: Secondary | ICD-10-CM

## 2013-06-05 LAB — FUNGUS CULTURE W SMEAR
Fungal Smear: NONE SEEN
Fungal Smear: NONE SEEN
Fungal Smear: NONE SEEN
Fungal Smear: NONE SEEN
Fungal Smear: NONE SEEN

## 2013-06-05 MED ORDER — OXYCODONE-ACETAMINOPHEN 5-325 MG PO TABS: 1.0000 | ORAL_TABLET | Freq: Three times a day (TID) | ORAL | Status: DC | PRN

## 2013-06-05 NOTE — Telephone Encounter (Signed)
Per Dr Luciana Axe called [facility] and gave a stop date of 06/08/13 and advised to pull PICC as well.

## 2013-06-05 NOTE — Progress Notes (Signed)
HPI:  Kelli Summers is a 57 year old woman who presented in early November with a community-acquired pneumonia and empyema following a trip to [place]. She had a thoracoscopic decortication on November 12. Cultures grew out group B streptococcus and she was treated with ceftriaxone. She home with a PICC line and will finish her ceftriaxone course this Friday.  She does have some incisional pain. She still takes oxycodone for that occasionally. Some days she does not take it all, others she will take 1 tablet 2 times a day. She has not been having fevers, chills, sweats, or shortness of breath. She's not having swelling in her legs.  Past Medical History  Diagnosis Date  . Diabetes mellitus   . History of stress test 01/2010    normal myoview stress test  . Chronic back pain   . Depression   . Chronic knee pain   . Hypertension   . Sinus tachycardia   . Ejection fraction       Current Outpatient Prescriptions  Medication Sig Dispense Refill  . dextrose 5 % SOLN 50 mL with cefTRIAXone 1 G SOLR 1 g Inject 1 g into the vein daily. 21 day supply then switched to oral antibiotic.  21 cartridge  0  . diazepam (VALIUM) 10 MG tablet Take 0.5 tablets (5 mg total) by mouth every 12 (twelve) hours as needed for anxiety.  30 tablet  0  . DULoxetine (CYMBALTA) 60 MG capsule Take 60 mg by mouth daily.      . insulin detemir (LEVEMIR) 100 UNIT/ML injection Inject 0.2 mLs (20 Units total) into the skin at bedtime.  10 mL  12  . insulin lispro (HUMALOG KWIKPEN) 100 UNIT/ML SOPN Inject 6-10 Units into the skin 3 (three) times daily with meals.      . metoprolol succinate (TOPROL-XL) 12.5 mg TB24 24 hr tablet Take 0.5 tablets (12.5 mg total) by mouth daily.  15 tablet  3  . oxyCODONE-acetaminophen (ROXICET) 5-325 MG per tablet Take 1 tablet by mouth every 8 (eight) hours as needed for severe pain.  15 tablet  0   No current facility-administered medications for this visit.    Physical Exam BP 101/70   Pulse 96  Resp 20  Ht 5\' 5"  (1.651 m)  Wt 165 lb (74.844 kg)  BMI 27.46 kg/m2  SpO67 26% 57 year old woman in no acute distress Alert and oriented Lungs clear, essentially equal bilaterally Incision and chest tube sites healing well  Diagnostic Tests: CHEST 2 VIEW  COMPARISON: 05/23/2013  FINDINGS:  Right arm PICC line tip projects over mid SVC.  Upper normal heart size.  Normal mediastinal contours and pulmonary vascularity.  Chronic peribronchial thickening with persistent effusion at right  base.  Minimal right basilar atelectasis.  Remaining lungs clear.  No pneumothorax.  Bones demineralized.  IMPRESSION:  Persistent right basilar effusion and mild atelectasis.  Electronically Signed  By: Ulyses Southward M.D.  On: 06/05/2013 12:39   Impression: 57 year old woman who is now a month out from a thoracoscopic decortication for empyema. Overall she's doing well. She will complete her antibiotics on Friday. Her chest x-ray looks as good as you would hope for at this point in time. She does have some residual basilar atelectasis and pleural thickening.  She's doing well from a pain standpoint. She is still occasionally taking the oxycodone. I gave her a prescription for an additional 40 oxycodone tablets, one to 2 tablets by mouth 3 times a day when necessary. She may supplement that with  acetaminophen or ibuprofen.  She may begin driving, but was cautioned to not drive while taking narcotics. She should herself to short trypsin around pounds of the first of the year. There are no restrictions on her physical activities. However she was cautioned that she may experience pain that she increases her physical activities and she should start new activities gradually.  Plan: She will followup with Dr. Phillips Odor.  I'll be happy to see her back any time if I can be of any further assistance with her care.

## 2013-06-06 ENCOUNTER — Encounter: Payer: Self-pay | Admitting: Internal Medicine

## 2013-06-06 ENCOUNTER — Ambulatory Visit (INDEPENDENT_AMBULATORY_CARE_PROVIDER_SITE_OTHER): Payer: 59 | Admitting: Internal Medicine

## 2013-06-06 VITALS — BP 102/68 | HR 98 | Ht 65.0 in | Wt 169.2 lb

## 2013-06-06 DIAGNOSIS — J869 Pyothorax without fistula: Secondary | ICD-10-CM

## 2013-06-06 DIAGNOSIS — J189 Pneumonia, unspecified organism: Secondary | ICD-10-CM

## 2013-06-06 DIAGNOSIS — J9601 Acute respiratory failure with hypoxia: Secondary | ICD-10-CM

## 2013-06-06 DIAGNOSIS — J9600 Acute respiratory failure, unspecified whether with hypoxia or hypercapnia: Secondary | ICD-10-CM

## 2013-06-06 DIAGNOSIS — J90 Pleural effusion, not elsewhere classified: Secondary | ICD-10-CM

## 2013-06-06 NOTE — Patient Instructions (Signed)
Return to see me in 3 months with full PFT Keep pushing therapy

## 2013-06-06 NOTE — Progress Notes (Signed)
Subjective:    Patient ID: Kelli Summers, female    DOB: 1955/12/12, 57 y.o.   MRN: [id number]  HPI   62 yowf quit smoking 04/2012 admitted initially [facility] and transferred to [facility]  Admission date: 04/29/2013   Discharge Date: 05/18/2013    Discharge Diagnosis community-acquired pneumonia with empyema and bacteremia strep species HCAP (healthcare-associated pneumonia)  Hypokalemia  Loculated pleural effusion  05/10/13 Right video-assisted thoracoscopy, drainage of empyema,  visceral and parietal pleural decortication. Acute respiratory failure with hypoxia  Encephalopathy acute  Empyema  Anemia, blood loss  Bacteremia due to Streptococcus  Empyema lung  Physical deconditioning  Hx patient with history of diabetes mellitus type 2 who is now insulin-dependent, hypertension, anemia of chronic disease, depression, anxiety, chronic pain who was admitted to the hospital initially at [facility] with chief complaints of generalized fevers body aches, she had recently traveled from [place]. So workup was consistent with committed for pneumonia with evidence of empyema.  Subsequently transferred to [facility] and was seen by both cardiothoracic surgery and pulmonary critical care, she underwent supportive treatment with IV antibiotics based on her culture and sensitivity reports which showed both pleural fluid and blood cultures to be positive for Streptococcus intermedius, this was penicillin and Rocephin sensitive, she underwent VATS procedure, subsequently chest tube placement which was eventually removed several days ago. She now has mild pleural effusions bilaterally, she has been receiving IV antibiotics which are Rocephin after consultations with ID physician Dr. Ninetta Lights over the phone, she has a PICC line in the left arm and the plan is to provide her with IV antibiotics for the next few weeks with close outpatient followup with pulmonary and PCP. She is clinically much  better and completely symptom-free now. I will also request one time outpatient followup with ID post discharge.   05/23/2013 1st [facility] office visit/ Wert post hosp f/u cc back pain worse with deep breathing since surgery but overall improving and only using percocet twice daily, min cough andno purulent sputum, mild doe and mostly just fatigue, no fever or aches  No obvious day to day or daytime variabilty or assoc  r chest tightness, subjective wheeze overt sinus or hb symptoms. No unusual exp hx or h/o childhood pna/ asthma or knowledge of premature birth.  Sleeping ok without nocturnal  or early am exacerbation  of respiratory  c/o's or need for noct saba. Also denies any obvious fluctuation of symptoms with weather or environmental changes or other aggravating or alleviating factors except as outlined above    OV 06/06/13  Post ICU followup. DR Sherene Sires requested transfer of care to Dr Marchelle Gearing. PAtent and family state she is progressing well. Still deconditioned but much improved. No new complaints.    Past, Family, Social reviewed: no change since last visit         Review of Systems  Constitutional: Negative for fever and unexpected weight change.  HENT: Negative for congestion, dental problem, ear pain, nosebleeds, postnasal drip, rhinorrhea, sinus pressure, sneezing, sore throat and trouble swallowing.   Eyes: Negative for redness and itching.  Respiratory: Negative for cough, chest tightness, shortness of breath and wheezing.   Cardiovascular: Negative for palpitations and leg swelling.  Gastrointestinal: Negative for nausea and vomiting.  Genitourinary: Negative for dysuria.  Musculoskeletal: Negative for joint swelling.  Skin: Negative for rash.  Neurological: Negative for headaches.  Hematological: Does not bruise/bleed easily.  Psychiatric/Behavioral: Negative for dysphoric mood. The patient is not nervous/anxious.  Objective:   Physical Exam  Vitals  reviewed. Constitutional: She is oriented to person, place, and time. She appears well-developed and well-nourished. No distress.  deconditioned  HENT:  Head: Normocephalic and atraumatic.  Right Ear: External ear normal.  Left Ear: External ear normal.  Mouth/Throat: Oropharynx is clear and moist. No oropharyngeal exudate.  Eyes: Conjunctivae and EOM are normal. Pupils are equal, round, and reactive to light. Right eye exhibits no discharge. Left eye exhibits no discharge. No scleral icterus.  Neck: Normal range of motion. Neck supple. No JVD present. No tracheal deviation present. No thyromegaly present.  Cardiovascular: Normal rate, regular rhythm, normal heart sounds and intact distal pulses.  Exam reveals no gallop and no friction rub.   No murmur heard. Pulmonary/Chest: Effort normal and breath sounds normal. No respiratory distress. She has no wheezes. She has no rales. She exhibits no tenderness.  Abdominal: Soft. Bowel sounds are normal. She exhibits no distension and no mass. There is no tenderness. There is no rebound and no guarding.  Musculoskeletal: Normal range of motion. She exhibits no edema and no tenderness.  Sits with cane R PICC in arm  Lymphadenopathy:    She has no cervical adenopathy.  Neurological: She is alert and oriented to person, place, and time. She has normal reflexes. No cranial nerve deficit. She exhibits normal muscle tone. Coordination normal.  Skin: Skin is warm and dry. No rash noted. She is not diaphoretic. No erythema. No pallor.  Psychiatric: She has a normal mood and affect. Her behavior is normal. Judgment and thought content normal.          Assessment & Plan:

## 2013-06-08 ENCOUNTER — Encounter: Payer: Self-pay | Admitting: Internal Medicine

## 2013-06-08 NOTE — Assessment & Plan Note (Addendum)
She is doing well and no current signs of active infection.  She will complete her course Friday and stop.  No further antibiotics indicated.  She will monitor for fever, chills or increase in cough or call with any concerning signs.  Otherwise she will follow up PRN.    Medical history and radiology personally reviewed.

## 2013-06-08 NOTE — Progress Notes (Signed)
   Subjective:    Patient ID: Kelli Summers, female    DOB: 1955-09-03, 57 y.o.   MRN: [id number]  HPI She comes in for evaluation as a new patient.  She initially presented to [facility] with malaise after her trip to [place] and treated supportively and for her elevated blood sugars.  She continued not to feel well and returned and noted a pulmonary opacity with parapneumonic effusion.  She was initially treated with broad spectrum antibiotics and had a thoracentesis then was sent to [facility] for management by CT surgery and pulmonary CC.  She underwent decortication and drainage by Dr. Dorris Fetch and noted 1 L of pus.  Her culture grew out Group B Strep and a previous blood culture with Group C.  She was started on therapy with ceftiaxone and has continued on that with a stop date of 12/12.   Currently she has no fever, no chills, no significant pain.  She is weak but overall feels much better and minimal cough.     Review of Systems  Constitutional: Negative for fever and chills.  Respiratory: Negative for cough and shortness of breath.   Gastrointestinal: Negative for nausea and diarrhea.  Skin: Negative for rash.       Objective:   Physical Exam  Constitutional: She is oriented to person, place, and time. She appears well-developed and well-nourished. No distress.  HENT:  Mouth/Throat: No oropharyngeal exudate.  Eyes: No scleral icterus.  Cardiovascular: Normal rate, regular rhythm and normal heart sounds.   No murmur heard. Pulmonary/Chest: Effort normal and breath sounds normal. No respiratory distress.  Lymphadenopathy:    She has no cervical adenopathy.  Neurological: She is alert and oriented to person, place, and time.  Skin: No rash noted.  Psychiatric: She has a normal mood and affect. Her behavior is normal.          Assessment & Plan:

## 2013-06-21 LAB — AFB CULTURE WITH SMEAR (NOT AT ARMC)
Acid Fast Smear: NONE SEEN
Acid Fast Smear: NONE SEEN
Acid Fast Smear: NONE SEEN
Acid Fast Smear: NONE SEEN

## 2013-06-21 NOTE — Assessment & Plan Note (Signed)
Improving from deconditinoning  Plan Continue to push PT pft in 3 months to assess lung function

## 2013-06-24 LAB — AFB CULTURE WITH SMEAR (NOT AT ARMC): Acid Fast Smear: NONE SEEN

## 2013-07-02 ENCOUNTER — Encounter: Payer: Self-pay | Admitting: Cardiology

## 2013-07-02 ENCOUNTER — Ambulatory Visit (INDEPENDENT_AMBULATORY_CARE_PROVIDER_SITE_OTHER): Payer: 59 | Admitting: Cardiology

## 2013-07-02 VITALS — BP 133/86 | HR 108 | Ht 65.0 in | Wt 168.4 lb

## 2013-07-02 DIAGNOSIS — R000 Tachycardia, unspecified: Secondary | ICD-10-CM

## 2013-07-02 DIAGNOSIS — I498 Other specified cardiac arrhythmias: Secondary | ICD-10-CM

## 2013-07-02 DIAGNOSIS — Z8679 Personal history of other diseases of the circulatory system: Secondary | ICD-10-CM

## 2013-07-02 MED ORDER — METOPROLOL SUCCINATE 12.5 MG HALF TABLET: 25.0000 mg | ORAL_TABLET | Freq: Two times a day (BID) | ORAL | Status: DC

## 2013-07-02 MED ORDER — METOPROLOL SUCCINATE ER 25 MG PO TB24: 25.0000 mg | ORAL_TABLET | Freq: Two times a day (BID) | ORAL | Status: DC

## 2013-07-02 NOTE — Assessment & Plan Note (Signed)
Blood pressure is controlled. No change in therapy. 

## 2013-07-02 NOTE — Patient Instructions (Addendum)
Your physician has recommended you make the following change in your medication: increase Metoprolol to 25 mg once daily for 1 week then increase to 25 mg twice daily  Your physician recommends that you schedule a follow-up appointment in: 6 weeks

## 2013-07-02 NOTE — Progress Notes (Signed)
HPI  Patient is seen today to followup her resting sinus tachycardia. I saw her as a new patient on May 30, 2013. She had sinus tachycardia in the hospital. When I saw her I felt this might be benign persistent sinus tachycardia. Two-dimensional echo was done. Ejection fraction is 55-60%. The study was technically difficult. There were no significant valvular abnormalities. We know her thyroid functions are normal. I had a very small dose of beta blocker. She tolerated this without difficulties. Her heart rate remains mildly elevated.  Allergies  Allergen Reactions  . Ciprofloxacin Itching  . Hydrocodone-Acetaminophen Itching  . Penicillins Itching    Tolerates Ceftin  . Percocet [Oxycodone-Acetaminophen] Itching    Current Outpatient Prescriptions  Medication Sig Dispense Refill  . diazepam (VALIUM) 10 MG tablet Take 0.5 tablets (5 mg total) by mouth every 12 (twelve) hours as needed for anxiety.  30 tablet  0  . DULoxetine (CYMBALTA) 60 MG capsule Take 60 mg by mouth daily.      . insulin detemir (LEVEMIR) 100 UNIT/ML injection Inject 0.2 mLs (20 Units total) into the skin at bedtime.  10 mL  12  . insulin lispro (HUMALOG KWIKPEN) 100 UNIT/ML SOPN Inject 6-10 Units into the skin 3 (three) times daily with meals.      . metoprolol succinate (TOPROL-XL) 12.5 mg TB24 24 hr tablet Take 0.5 tablets (12.5 mg total) by mouth daily.  15 tablet  3   No current facility-administered medications for this visit.    History   Social History  . Marital Status: Divorced    Spouse Name: N/A    Number of Children: N/A  . Years of Education: N/A   Occupational History  . Not on file.   Social History Main Topics  . Smoking status: Former Smoker -- 0.50 packs/day for 40 years    Types: Cigarettes    Quit date: 04/28/2012  . Smokeless tobacco: Current User  . Alcohol Use: Yes  . Drug Use: 2.00 per week    Special: Marijuana  . Sexual Activity: Not on file   Other Topics Concern    . Not on file   Social History Narrative  . No narrative on file    Family History  Problem Relation Age of Onset  . Emphysema Maternal Grandfather     smoked  . Breast cancer Maternal Aunt     Past Medical History  Diagnosis Date  . Diabetes mellitus   . History of stress test 01/2010    normal myoview stress test  . Chronic back pain   . Depression   . Chronic knee pain   . Hypertension   . Sinus tachycardia   . Ejection fraction     Past Surgical History  Procedure Laterality Date  . Back surgery    . Cholecystectomy    . Abdominal hysterectomy    . Video assisted thoracoscopy (vats)/decortication Right 05/09/2013    Procedure: VIDEO ASSISTED THORACOSCOPY (VATS)/DECORTICATION;  Surgeon: Loreli SlotSteven C Hendrickson, MD;  [facility];  Service: Thoracic;  Laterality: Right;  . Wedge resection Right 05/09/2013    Procedure: WEDGE RESECTION RIGHT LOWER LOBE;  Surgeon: Loreli SlotSteven C Hendrickson, MD;  [facility];  Service: Thoracic;  Laterality: Right;    Patient Active Problem List   Diagnosis Date Noted  . Ejection fraction   . Sinus tachycardia   . Physical deconditioning 05/15/2013  . Bacteremia due to Streptococcus 05/11/2013  . Empyema of right pleural space 05/11/2013  .  Encephalopathy acute 05/10/2013  . Empyema 05/10/2013  . Anemia, blood loss 05/10/2013  . Acute respiratory failure with hypoxia 05/09/2013  . Loculated pleural effusion 05/08/2013  . HCAP (healthcare-associated pneumonia) 05/06/2013  . Hypokalemia 05/06/2013  . Hyperglycemia without ketosis 04/30/2013  . Diabetes mellitus type 2, uncontrolled 04/30/2013  . DKA (diabetic ketoacidoses) 04/30/2013  . Hyperglycemia 04/30/2013  . Hypotension, unspecified 04/30/2013  . SHOULDER PAIN 03/02/2007  . DIABETES 02/14/2007  . HIGH BLOOD PRESSURE 02/14/2007    ROS   Patient denies fever, chills, headache, sweats, rash, change in vision, change in hearing, chest pain, cough, nausea vomiting, urinary  symptoms. All other systems are reviewed and are negative.  PHYSICAL EXAM  Patient is oriented to person time and place. Affect is normal. There is no jugulovenous distention. Lungs are clear. Respiratory effort is nonlabored. Cardiac exam reveals S1 and S2. There no clicks or significant murmurs. The abdomen is soft. There is no peripheral edema.  Filed Vitals:   07/02/13 1407  BP: 133/86  Pulse: 108  Height: 5\' 5"  (1.651 m)  Weight: 168 lb 6.4 oz (76.386 kg)     ASSESSMENT & PLAN

## 2013-07-02 NOTE — Assessment & Plan Note (Signed)
I feel that her sinus tachycardia is benign. We will increase her beta blocker slightly. The dose will be increased to 25 mg a day and then 25 mg twice a day and and I will see her back for followup.

## 2013-08-02 ENCOUNTER — Other Ambulatory Visit (HOSPITAL_COMMUNITY): Payer: Self-pay | Admitting: Family Medicine

## 2013-08-02 DIAGNOSIS — R229 Localized swelling, mass and lump, unspecified: Secondary | ICD-10-CM

## 2013-08-06 ENCOUNTER — Other Ambulatory Visit (HOSPITAL_COMMUNITY): Payer: Self-pay | Admitting: Family Medicine

## 2013-08-06 ENCOUNTER — Ambulatory Visit (HOSPITAL_COMMUNITY)
Admission: RE | Admit: 2013-08-06 | Discharge: 2013-08-06 | Disposition: A | Discharge status: Home / Self Care | Payer: 59 | Source: Ambulatory Visit | Attending: Family Medicine | Admitting: Family Medicine

## 2013-08-06 DIAGNOSIS — R229 Localized swelling, mass and lump, unspecified: Secondary | ICD-10-CM

## 2013-08-14 ENCOUNTER — Ambulatory Visit: Payer: 59 | Admitting: Cardiology

## 2013-09-04 ENCOUNTER — Other Ambulatory Visit: Payer: Self-pay | Admitting: *Deleted

## 2013-09-04 MED ORDER — METOPROLOL SUCCINATE ER 25 MG PO TB24: 25.0000 mg | ORAL_TABLET | Freq: Two times a day (BID) | ORAL | Status: DC

## 2013-09-12 ENCOUNTER — Ambulatory Visit: Payer: 59 | Admitting: Cardiology

## 2013-10-09 ENCOUNTER — Other Ambulatory Visit: Payer: Self-pay

## 2013-10-09 MED ORDER — METOPROLOL SUCCINATE ER 25 MG PO TB24: 25.0000 mg | ORAL_TABLET | Freq: Two times a day (BID) | ORAL | Status: DC

## 2013-10-15 ENCOUNTER — Encounter: Payer: Self-pay | Admitting: Cardiology

## 2013-10-15 ENCOUNTER — Ambulatory Visit (INDEPENDENT_AMBULATORY_CARE_PROVIDER_SITE_OTHER): Payer: 59 | Admitting: Cardiology

## 2013-10-15 VITALS — BP 112/60 | HR 97 | Ht 64.0 in | Wt 181.8 lb

## 2013-10-15 DIAGNOSIS — I498 Other specified cardiac arrhythmias: Secondary | ICD-10-CM

## 2013-10-15 DIAGNOSIS — R000 Tachycardia, unspecified: Secondary | ICD-10-CM

## 2013-10-15 DIAGNOSIS — I10 Essential (primary) hypertension: Secondary | ICD-10-CM | POA: Insufficient documentation

## 2013-10-15 MED ORDER — METOPROLOL SUCCINATE ER 25 MG PO TB24: 25.0000 mg | ORAL_TABLET | Freq: Two times a day (BID) | ORAL | Status: DC

## 2013-10-15 NOTE — Patient Instructions (Signed)
Your physician recommends that you continue on your current medications as directed. Please refer to the Current Medication list given to you today.  Your physician wants you to follow-up in: 1 year. You will receive a reminder letter in the mail two months in advance. If you don't receive a letter, please call our office to schedule the follow-up appointment.  

## 2013-10-15 NOTE — Assessment & Plan Note (Signed)
The patient has benign resting mild sinus tachycardia. This is better on beta blockers. She's exercising regularly. No further workup.

## 2013-10-15 NOTE — Progress Notes (Signed)
Patient ID: Kelli Summers, female   DOB: 10/26/1955, 58 y.o.   MRN: [id number]    HPI  Patient is seen today in followup a history of resting sinus tachycardia. She's actually doing very well. We know that she has good left ventricular function. Her thyroid functions are normal. She's doing well with a small dose of beta blockade. She is joined a gym with her grandchild. She is exercising regularly and her peak heart rate is in the range of 125 with walking.  Allergies  Allergen Reactions  . Ciprofloxacin Itching  . Hydrocodone-Acetaminophen Itching  . Penicillins Itching    Tolerates Ceftin  . Percocet [Oxycodone-Acetaminophen] Itching    Current Outpatient Prescriptions  Medication Sig Dispense Refill  . DULoxetine (CYMBALTA) 60 MG capsule Take 60 mg by mouth daily.      . insulin detemir (LEVEMIR) 100 UNIT/ML injection Inject 0.2 mLs (20 Units total) into the skin at bedtime.  10 mL  12  . insulin lispro (HUMALOG KWIKPEN) 100 UNIT/ML SOPN Inject 6-10 Units into the skin 3 (three) times daily with meals.      . metoprolol succinate (TOPROL-XL) 25 MG 24 hr tablet Take 1 tablet (25 mg total) by mouth 2 (two) times daily.  60 tablet  0  . diazepam (VALIUM) 10 MG tablet Take 0.5 tablets (5 mg total) by mouth every 12 (twelve) hours as needed for anxiety.  30 tablet  0   No current facility-administered medications for this visit.    History   Social History  . Marital Status: Divorced    Spouse Name: N/A    Number of Children: N/A  . Years of Education: N/A   Occupational History  . Not on file.   Social History Main Topics  . Smoking status: Former Smoker -- 0.50 packs/day for 40 years    Types: Cigarettes    Quit date: 04/28/2012  . Smokeless tobacco: Current User  . Alcohol Use: Yes  . Drug Use: 2.00 per week    Special: Marijuana  . Sexual Activity: Not on file   Other Topics Concern  . Not on file   Social History Narrative  . No narrative on file    Family  History  Problem Relation Age of Onset  . Emphysema Maternal Grandfather     smoked  . Breast cancer Maternal Aunt     Past Medical History  Diagnosis Date  . Diabetes mellitus   . History of stress test 01/2010    normal myoview stress test  . Chronic back pain   . Depression   . Chronic knee pain   . Hypertension   . Sinus tachycardia   . Ejection fraction     Past Surgical History  Procedure Laterality Date  . Back surgery    . Cholecystectomy    . Abdominal hysterectomy    . Video assisted thoracoscopy (vats)/decortication Right 05/09/2013    Procedure: VIDEO ASSISTED THORACOSCOPY (VATS)/DECORTICATION;  Surgeon: Loreli SlotSteven C Hendrickson, MD;  [facility];  Service: Thoracic;  Laterality: Right;  . Wedge resection Right 05/09/2013    Procedure: WEDGE RESECTION RIGHT LOWER LOBE;  Surgeon: Loreli SlotSteven C Hendrickson, MD;  [facility];  Service: Thoracic;  Laterality: Right;    Patient Active Problem List   Diagnosis Date Noted  . Ejection fraction   . Sinus tachycardia   . Physical deconditioning 05/15/2013  . Bacteremia due to Streptococcus 05/11/2013  . Empyema of right pleural space 05/11/2013  . Encephalopathy acute 05/10/2013  .  Empyema 05/10/2013  . Anemia, blood loss 05/10/2013  . Acute respiratory failure with hypoxia 05/09/2013  . Loculated pleural effusion 05/08/2013  . HCAP (healthcare-associated pneumonia) 05/06/2013  . Hypokalemia 05/06/2013  . Hyperglycemia without ketosis 04/30/2013  . Diabetes mellitus type 2, uncontrolled 04/30/2013  . DKA (diabetic ketoacidoses) 04/30/2013  . Hyperglycemia 04/30/2013  . Hypotension, unspecified 04/30/2013  . SHOULDER PAIN 03/02/2007  . DIABETES 02/14/2007  . HIGH BLOOD PRESSURE 02/14/2007    ROS   Patient denies fever, chills, headache, sweats, rash, change in vision, change in hearing, chest pain, cough, nausea vomiting, urinary symptoms. All other systems are reviewed and are negative.  PHYSICAL EXAM   Patient is oriented to person time and place. She was a family member. Affect is normal. She is overweight. Lungs are clear. Respiratory effort is nonlabored. Cardiac exam reveals S1 and S2. There no clicks or significant murmurs. The abdomen is soft. There is no peripheral edema.  Filed Vitals:   10/15/13 1529  BP: 112/60  Pulse: 97  Height: 5\' 4"  (1.626 m)  Weight: 181 lb 12.8 oz (82.464 kg)    ASSESSMENT & PLAN

## 2013-10-15 NOTE — Assessment & Plan Note (Signed)
Blood pressures control. No change in therapy. 

## 2013-11-12 ENCOUNTER — Ambulatory Visit (INDEPENDENT_AMBULATORY_CARE_PROVIDER_SITE_OTHER): Payer: 59 | Admitting: Internal Medicine

## 2013-11-12 ENCOUNTER — Encounter: Payer: Self-pay | Admitting: Internal Medicine

## 2013-11-12 VITALS — BP 138/82 | HR 74 | Ht 63.0 in | Wt 188.6 lb

## 2013-11-12 DIAGNOSIS — F17200 Nicotine dependence, unspecified, uncomplicated: Secondary | ICD-10-CM

## 2013-11-12 DIAGNOSIS — J869 Pyothorax without fistula: Secondary | ICD-10-CM

## 2013-11-12 DIAGNOSIS — J45909 Unspecified asthma, uncomplicated: Secondary | ICD-10-CM

## 2013-11-12 DIAGNOSIS — J189 Pneumonia, unspecified organism: Secondary | ICD-10-CM

## 2013-11-12 DIAGNOSIS — J9601 Acute respiratory failure with hypoxia: Secondary | ICD-10-CM

## 2013-11-12 DIAGNOSIS — J90 Pleural effusion, not elsewhere classified: Secondary | ICD-10-CM

## 2013-11-12 LAB — PULMONARY FUNCTION TEST
DL/VA % pred: 103 %
DL/VA: 4.84 ml/min/mmHg/L
DLCO unc % pred: 97 %
DLCO unc: 22.34 ml/min/mmHg
FEF 25-75 Post: 1.4 L/sec
FEF 25-75 Pre: 0.86 L/sec
FEF2575-%Change-Post: 63 %
FEF2575-%Pred-Post: 59 %
FEF2575-%Pred-Pre: 36 %
FEV1-%Change-Post: 13 %
FEV1-%Pred-Post: 74 %
FEV1-%Pred-Pre: 65 %
FEV1-Post: 1.87 L
FEV1-Pre: 1.65 L
FEV1FVC-%Change-Post: 6 %
FEV1FVC-%Pred-Pre: 83 %
FEV6-%Change-Post: 5 %
FEV6-%Pred-Post: 84 %
FEV6-%Pred-Pre: 79 %
FEV6-Post: 2.64 L
FEV6-Pre: 2.49 L
FEV6FVC-%Change-Post: 1 %
FEV6FVC-%Pred-Post: 102 %
FEV6FVC-%Pred-Pre: 101 %
FVC-%Change-Post: 6 %
FVC-%Pred-Post: 83 %
FVC-%Pred-Pre: 78 %
FVC-Post: 2.7 L
FVC-Pre: 2.54 L
Post FEV1/FVC ratio: 69 %
Post FEV6/FVC ratio: 100 %
Pre FEV1/FVC ratio: 65 %
Pre FEV6/FVC Ratio: 98 %
RV % pred: 50 %
RV: 0.96 L
TLC % pred: 92 %
TLC: 4.54 L

## 2013-11-12 MED ORDER — BECLOMETHASONE DIPROPIONATE 80 MCG/ACT IN AERS: 2.0000 | INHALATION_SPRAY | Freq: Two times a day (BID) | RESPIRATORY_TRACT | Status: DC

## 2013-11-12 MED ORDER — ALBUTEROL SULFATE HFA 108 (90 BASE) MCG/ACT IN AERS: 2.0000 | INHALATION_SPRAY | RESPIRATORY_TRACT | Status: DC | PRN

## 2013-11-12 NOTE — Progress Notes (Signed)
Subjective:    Patient ID: Kelli Summers, female    DOB: [birth year], 58 y.o.   MRN: [id number]  HPI    [age] yowf quit smoking 04/2012 admitted [facility] and [facility]  Admission date: 04/29/2013   Discharge Date: 05/18/2013    Discharge Diagnosis community-acquired pneumonia with empyema and bacteremia strep species HCAP (healthcare-associated pneumonia)  Hypokalemia  Loculated pleural effusion  05/10/13 Right video-assisted thoracoscopy, drainage of empyema,  visceral and parietal pleural decortication. Acute respiratory failure with hypoxia  Encephalopathy acute  Empyema  Anemia, blood loss  Bacteremia due to Streptococcus  Empyema lung  Physical deconditioning  Hx patient with history of diabetes mellitus type 2 who is now insulin-dependent, hypertension, anemia of chronic disease, depression, anxiety, chronic pain who was admitted to the hospital [facility] at [facility] with chief complaints of generalized fevers body aches, [place]. So workup was consistent with committed for pneumonia with evidence of empyema.  [facility]  and was seen by both cardiothoracic surgery and pulmonary critical care, she underwent supportive treatment with IV antibiotics based on her culture and sensitivity reports which showed both pleural fluid and blood cultures to be positive for Streptococcus intermedius, this was penicillin and Rocephin sensitive, she underwent VATS procedure, subsequently chest tube placement which was eventually removed several days ago. She now has mild pleural effusions bilaterally, she has been receiving IV antibiotics which are Rocephin after consultations with ID physician Dr. Ninetta LightsHatcher over the phone, she has a PICC line in the left arm and the plan is to provide her with IV antibiotics for the next few weeks with close outpatient followup with pulmonary and PCP. She is clinically much  better and completely symptom-free now. I will also request one time outpatient followup with ID post discharge.   05/23/2013 1st [name] office visit/ Wert post hosp f/u cc back pain worse with deep breathing since surgery but overall improving and only using percocet twice daily, min cough andno purulent sputum, mild doe and mostly just fatigue, no fever or aches  No obvious day to day or daytime variabilty or assoc  r chest tightness, subjective wheeze overt sinus or hb symptoms. No unusual exp hx or h/o childhood pna/ asthma or knowledge of premature birth.  Sleeping ok without nocturnal  or early am exacerbation  of respiratory  c/o's or need for noct saba. Also denies any obvious fluctuation of symptoms with weather or environmental changes or other aggravating or alleviating factors except as outlined above    OV 06/06/13   Post ICU followup. DR Sherene SiresWert requested transfer of care to Dr Marchelle Gearingamaswamy. PAtent and family state she is progressing well. Still deconditioned but much improved. No new complaints.    Past, Family, Social reviewed: no change since last visit  REC Return to see me in 3 months with full PFT Keep pushing therapy  OV 11/12/2013  Chief Complaint  Patient presents with  . Follow-up    With PFT results. Pt states breathing has improved since last OV. Denies SOB, cough, and CP. Overall pt states is doing well.     Post ICU followup: Respiratory failure due to empyema in this smoker.  Since I last saw him December 2014 she has done extremely well. She looks completely different and I cannot recognize any more. She has gained weight. She has done her physical therapy. She is feeling completely normal and does not have any respiratory or any other  complaints.  She continues to smoke. She is using a mixture of electronic cigarettes and conventional cigarettes. She had questions about the home and benefit of electronic cigarettes and we discussed  this.  Spirometry today though shows asthma with bronchodilator reversibility and a normal diffusion capacity. FEV1 is 1.6 L or 65%. Ratio is 65. Post bronchodilator FEV1 is 1.87 L/74%. This is a 13% bronchodilator response. Total lung capacity is 4.5 L/92% and normal. The os is 22.3/97% a normal.    reports that she has been smoking Cigarettes.  She has a 20 pack-year smoking history. She uses smokeless tobacco.   Review of Systems  Constitutional: Negative for fever and unexpected weight change.  HENT: Negative for congestion, dental problem, ear pain, nosebleeds, postnasal drip, rhinorrhea, sinus pressure, sneezing, sore throat and trouble swallowing.   Eyes: Negative for redness and itching.  Respiratory: Negative for cough, chest tightness, shortness of breath and wheezing.   Cardiovascular: Negative for palpitations and leg swelling.  Gastrointestinal: Negative for nausea and vomiting.  Genitourinary: Negative for dysuria.  Musculoskeletal: Negative for joint swelling.  Skin: Negative for rash.  Neurological: Negative for headaches.  Hematological: Does not bruise/bleed easily.  Psychiatric/Behavioral: Negative for dysphoric mood. The patient is not nervous/anxious.        Objective:   Physical Exam  Vitals reviewed. Constitutional: She is oriented to person, place, and time. She appears well-developed and well-nourished. No distress.  HENT:  Head: Normocephalic and atraumatic.  Right Ear: External ear normal.  Left Ear: External ear normal.  Mouth/Throat: Oropharynx is clear and moist. No oropharyngeal exudate.  Eyes: Conjunctivae and EOM are normal. Pupils are equal, round, and reactive to light. Right eye exhibits no discharge. Left eye exhibits no discharge. No scleral icterus.  Neck: Normal range of motion. Neck supple. No JVD present. No tracheal deviation present. No thyromegaly present.  Cardiovascular: Normal rate, regular rhythm, normal heart sounds and intact  distal pulses.  Exam reveals no gallop and no friction rub.   No murmur heard. Pulmonary/Chest: Effort normal and breath sounds normal. No respiratory distress. She has no wheezes. She has no rales. She exhibits no tenderness.  Abdominal: Soft. Bowel sounds are normal. She exhibits no distension and no mass. There is no tenderness. There is no rebound and no guarding.  Musculoskeletal: Normal range of motion. She exhibits no edema and no tenderness.  Lymphadenopathy:    She has no cervical adenopathy.  Neurological: She is alert and oriented to person, place, and time. She has normal reflexes. No cranial nerve deficit. She exhibits normal muscle tone. Coordination normal.  Skin: Skin is warm and dry. No rash noted. She is not diaphoretic. No erythema. No pallor.  Psychiatric: She has a normal mood and affect. Her behavior is normal. Judgment and thought content normal.          Assessment & Plan:

## 2013-11-12 NOTE — Progress Notes (Signed)
PFT done today. 

## 2013-11-12 NOTE — Patient Instructions (Signed)
#  asthma  - breathing tests show asthma  - STart QVAR 2 puff twice daily - learn technique from nurse  - USe albuterol as needed  #SMoking  - absolutely important to quit  #Followup   3 months with me or my NP  spirometry at time of followuup

## 2013-11-13 ENCOUNTER — Encounter: Payer: Self-pay | Admitting: Internal Medicine

## 2013-11-13 DIAGNOSIS — J441 Chronic obstructive pulmonary disease with (acute) exacerbation: Secondary | ICD-10-CM | POA: Insufficient documentation

## 2013-11-13 DIAGNOSIS — F17200 Nicotine dependence, unspecified, uncomplicated: Secondary | ICD-10-CM | POA: Insufficient documentation

## 2013-11-13 NOTE — Assessment & Plan Note (Signed)
PFts post ICU c/w moderate asthma. She is asymptomatic. Does not want to start mdi. AFter much counseling she agreed.   #asthma  - breathing tests show asthma  - STart QVAR 2 puff twice daily - learn technique from nurse  - USe albuterol as needed  #Followup   3 months with me or my NP  spirometry at time of followuup

## 2013-11-13 NOTE — Assessment & Plan Note (Signed)
Explained to her the dangers of vape smoking. ADvised to quit completely . She will create a self directed plan  3 min in smoking counseling

## 2014-02-14 ENCOUNTER — Ambulatory Visit: Payer: 59 | Admitting: Adult Health

## 2014-02-19 ENCOUNTER — Ambulatory Visit: Payer: 59 | Admitting: Adult Health

## 2014-11-12 ENCOUNTER — Other Ambulatory Visit: Payer: Self-pay

## 2014-11-12 MED ORDER — METOPROLOL SUCCINATE ER 25 MG PO TB24: 25.0000 mg | ORAL_TABLET | Freq: Two times a day (BID) | ORAL | Status: DC

## 2015-01-28 ENCOUNTER — Other Ambulatory Visit (HOSPITAL_COMMUNITY): Payer: Self-pay | Admitting: Family Medicine

## 2015-01-28 DIAGNOSIS — Z1231 Encounter for screening mammogram for malignant neoplasm of breast: Secondary | ICD-10-CM

## 2015-02-03 ENCOUNTER — Ambulatory Visit (HOSPITAL_COMMUNITY)
Admission: RE | Admit: 2015-02-03 | Discharge: 2015-02-03 | Disposition: A | Discharge status: Home / Self Care | Payer: 59 | Source: Ambulatory Visit | Attending: Family Medicine | Admitting: Family Medicine

## 2015-02-03 DIAGNOSIS — Z1231 Encounter for screening mammogram for malignant neoplasm of breast: Secondary | ICD-10-CM | POA: Diagnosis not present

## 2015-02-24 IMAGING — CR DG CHEST 1V PORT
1 series · 1 of 1 positions shown · non-contrast
Comparison: April 29, 2013.

CLINICAL DATA: Status post thoracoscopy with wedge resection of the
right lower lobe.

EXAM:
PORTABLE CHEST - 1 VIEW

[AP]
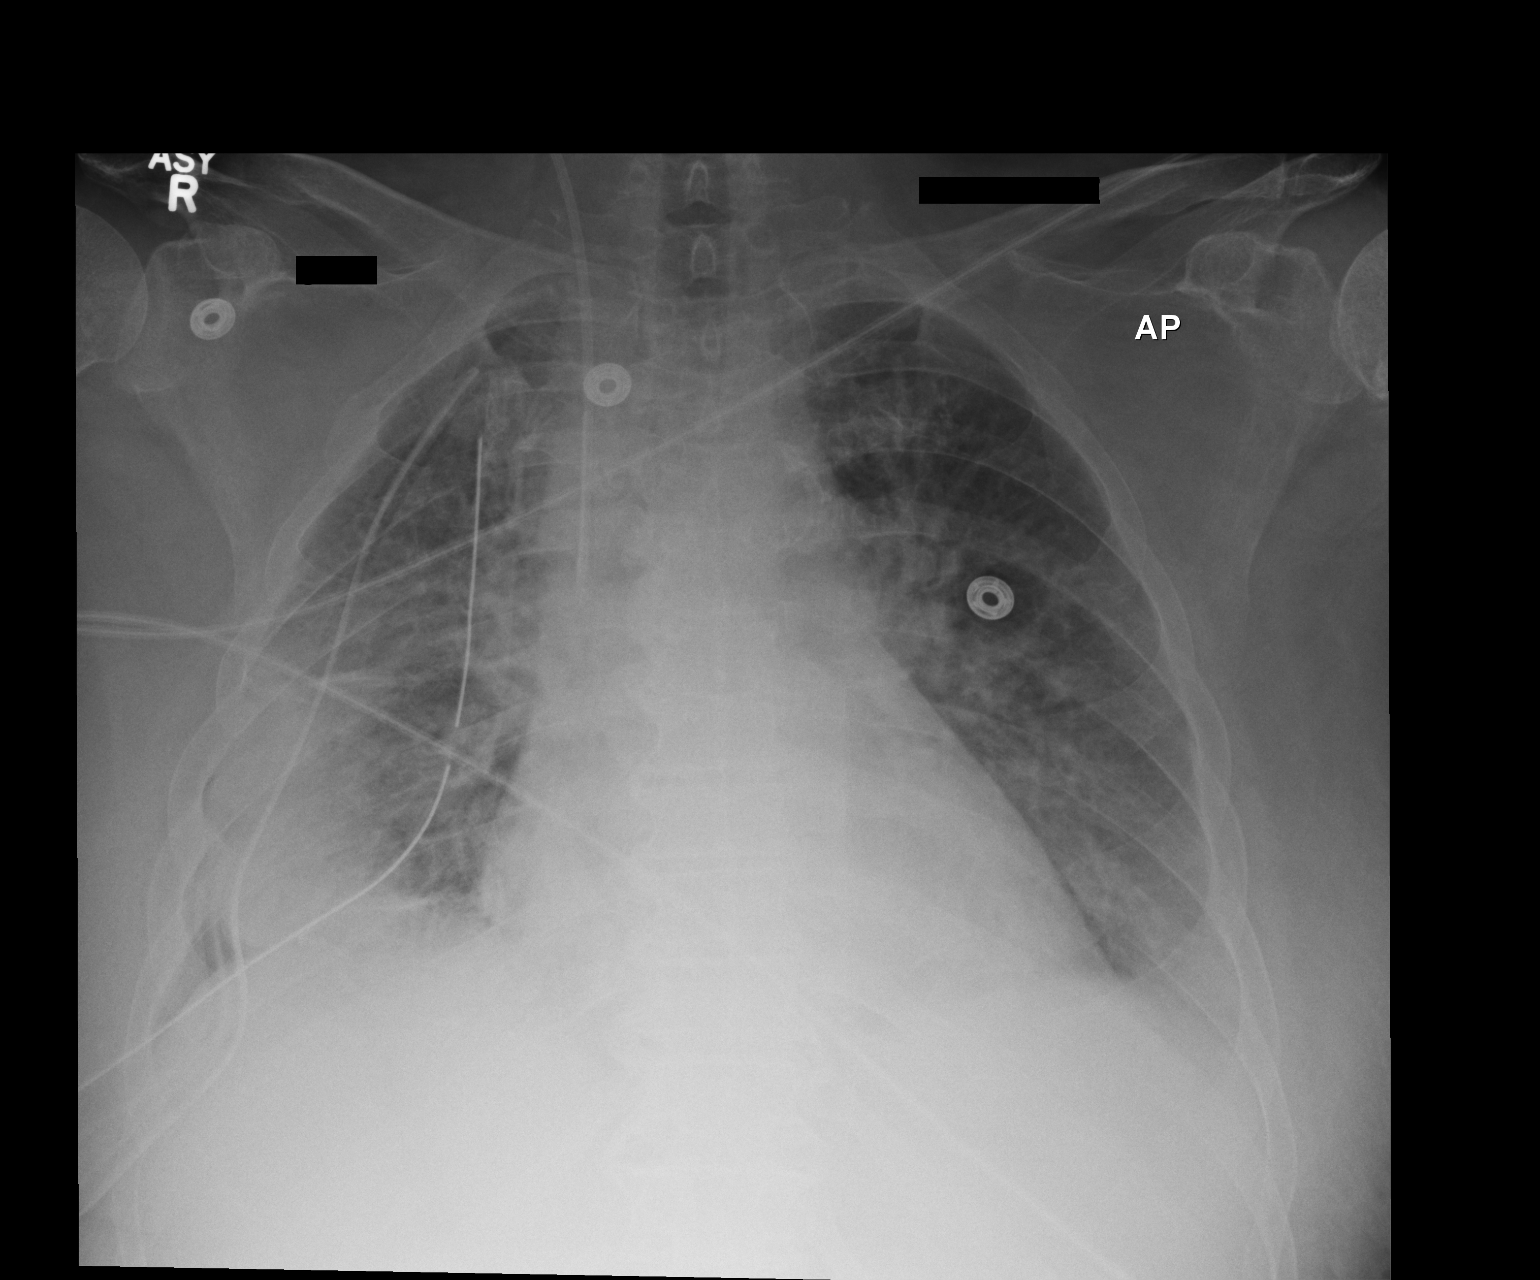

[1 of 1 positions shown; findings below may reference images not displayed]

FINDINGS: The lungs are reasonably well inflated. There are 2 chest tubes in
place on the right with the tips near the pulmonary apex. There is
no evidence of a pneumothorax. There is consolidation of portions of
the right mid and lower lung. There is no mediastinal shift. The
right internal jugular venous catheter tip lies in the region of the
proximal to mid SVC. The cardiac silhouette is mildly enlarged. The
pulmonary vascularity is mildly prominent bilaterally. There is
partial obscure a shin of the left hemidiaphragm which may reflect
minimal subsegmental atelectasis.
IMPRESSION: 1. The patient is status post right-sided thoracoscopy with right
lower lobe wedge resection. Two chest tubes are in place with no
evidence of a significant pneumothorax or pleural effusion. There is
parenchymal consolidation in the right mid to lower lung.
2. The appearance of the heart and pulmonary vascularity suggesting
low-grade CHF.
3. These results will be called to the ordering clinician or
representative by the Radiologist Assistant, and communication
documented in the PACS Dashboard.

## 2015-04-01 NOTE — H&P (Signed)
  NTS SOAP Note  Vital Signs:  Vitals as of: 04/01/2015: Systolic 127: Diastolic 73: Heart Rate 112: Temp 96.25F: Height 62ft 4in: Weight 208Lbs 0 Ounces: BMI 35.7  BMI : 35.7 kg/m2  Subjective: This 59 year old female presents for of need for screening TCS.  Never has had a TCS.  No family h/o colon carcinoma.  Denies any gi complaints.  Review of Symptoms:  Constitutional:fatigue Head:unremarkable Eyes:blurred vision bilateral Nose/Mouth/Throat:unremarkable Cardiovascular:  unremarkable Respiratory:dyspnea Gastrointestinal:  unremarkable   Genitourinary:unremarkable   back pain Skin:unremarkable Hematolgic/Lymphatic:unremarkable   Allergic/Immunologic:unremarkable   Past Medical History:  Reviewed  Past Medical History  Surgical History: lung surgery, back surgery Medical Problems: NIDDM, HTN Allergies: PCN, vicodin, ciprofloxacin Medications: losartan, toujeo, cymbalta, glyxamber, metoprolol   Social History:Reviewed  Social History  Preferred Language: English Race:  White Ethnicity: Not Hispanic / Latino Age: 5 year Marital Status:  D Alcohol: no   Smoking Status: Never smoker reviewed on 04/01/2015 Functional Status reviewed on 04/01/2015 ------------------------------------------------ Bathing: Normal Cooking: Normal Dressing: Normal Driving: Normal Eating: Normal Managing Meds: Normal Oral Care: Normal Shopping: Normal Toileting: Normal Transferring: Normal Walking: Normal Cognitive Status reviewed on 04/01/2015 ------------------------------------------------ Attention: Normal Decision Making: Normal Language: Normal Memory: Normal Motor: Normal Perception: Normal Problem Solving: Normal Visual and Spatial: Normal   Family History:Reviewed  Family Health History Mother, Deceased; Healthy;  Father, Living; Healthy;     Objective Information: General:Well appearing, well nourished in no distress. Heart:RRR,  no murmur Lungs:  CTA bilaterally, no wheezes, rhonchi, rales.  Breathing unlabored. Abdomen:Soft, NT/ND, no HSM, no masses. deferred jto procedure  Assessment:Need for screening TCS  Diagnoses: V76.51  Z12.11 Screening for malignant neoplasm of colon (Encounter for screening for malignant neoplasm of colon)  Procedures: 16109 - OFFICE OUTPATIENT NEW 20 MINUTES    Plan:  Scheduled for TCS on 04/15/15.  Suprep prescribed.   Patient Education:Alternative treatments to surgery were discussed with patient (and family).  Risks and benefits  of procedure including bleeding and perforation were fully explained to the patient (and family) who gave informed consent. Patient/family questions were addressed.  Follow-up:Pending Surgery

## 2015-04-15 ENCOUNTER — Encounter (HOSPITAL_COMMUNITY)
Admission: RE | Disposition: A | Discharge status: Home / Self Care | Payer: Self-pay | Source: Ambulatory Visit | Attending: General Surgery

## 2015-04-15 ENCOUNTER — Encounter (HOSPITAL_COMMUNITY): Payer: Self-pay | Admitting: *Deleted

## 2015-04-15 ENCOUNTER — Ambulatory Visit (HOSPITAL_COMMUNITY)
Admission: RE | Admit: 2015-04-15 | Discharge: 2015-04-15 | Disposition: A | Discharge status: Home / Self Care | Payer: 59 | Source: Ambulatory Visit | Attending: General Surgery | Admitting: General Surgery

## 2015-04-15 DIAGNOSIS — E118 Type 2 diabetes mellitus with unspecified complications: Secondary | ICD-10-CM | POA: Diagnosis not present

## 2015-04-15 DIAGNOSIS — K5730 Diverticulosis of large intestine without perforation or abscess without bleeding: Secondary | ICD-10-CM | POA: Diagnosis not present

## 2015-04-15 DIAGNOSIS — Z7984 Long term (current) use of oral hypoglycemic drugs: Secondary | ICD-10-CM | POA: Diagnosis not present

## 2015-04-15 DIAGNOSIS — Z79899 Other long term (current) drug therapy: Secondary | ICD-10-CM | POA: Diagnosis not present

## 2015-04-15 DIAGNOSIS — I10 Essential (primary) hypertension: Secondary | ICD-10-CM | POA: Insufficient documentation

## 2015-04-15 DIAGNOSIS — Z1211 Encounter for screening for malignant neoplasm of colon: Secondary | ICD-10-CM | POA: Insufficient documentation

## 2015-04-15 HISTORY — PX: COLONOSCOPY: SHX5424

## 2015-04-15 LAB — GLUCOSE, CAPILLARY: Glucose-Capillary: 113 mg/dL — ABNORMAL HIGH (ref 65–99)

## 2015-04-15 SURGERY — COLONOSCOPY
Anesthesia: Moderate Sedation

## 2015-04-15 MED ORDER — SODIUM CHLORIDE 0.9 % IV SOLN
INTRAVENOUS | Status: DC
  Administered 2015-04-15: 07:00:00 via INTRAVENOUS

## 2015-04-15 MED ORDER — MIDAZOLAM HCL 5 MG/5ML IJ SOLN
INTRAMUSCULAR | Status: AC
  Filled 2015-04-15: qty 5

## 2015-04-15 MED ORDER — MEPERIDINE HCL 50 MG/ML IJ SOLN
INTRAMUSCULAR | Status: DC | PRN
  Administered 2015-04-15: 50 mg via INTRAVENOUS
  Administered 2015-04-15: 25 mg via INTRAVENOUS

## 2015-04-15 MED ORDER — MEPERIDINE HCL 100 MG/ML IJ SOLN
INTRAMUSCULAR | Status: AC
  Filled 2015-04-15: qty 1

## 2015-04-15 MED ORDER — MIDAZOLAM HCL 5 MG/5ML IJ SOLN
INTRAMUSCULAR | Status: DC | PRN
  Administered 2015-04-15: 1 mg via INTRAVENOUS
  Administered 2015-04-15: 4 mg via INTRAVENOUS

## 2015-04-15 NOTE — Discharge Instructions (Signed)
Colonoscopy, Care After °Refer to this sheet in the next few weeks. These instructions provide you with information on caring for yourself after your procedure. Your health care provider may also give you more specific instructions. Your treatment has been planned according to current medical practices, but problems sometimes occur. Call your health care provider if you have any problems or questions after your procedure. °WHAT TO EXPECT AFTER THE PROCEDURE  °After your procedure, it is typical to have the following: °· A small amount of blood in your stool. °· Moderate amounts of gas and mild abdominal cramping or bloating. °HOME CARE INSTRUCTIONS °· Do not drive, operate machinery, or sign important documents for 24 hours. °· You may shower and resume your regular physical activities, but move at a slower pace for the first 24 hours. °· Take frequent rest periods for the first 24 hours. °· Walk around or put a warm pack on your abdomen to help reduce abdominal cramping and bloating. °· Drink enough fluids to keep your urine clear or pale yellow. °· You may resume your normal diet as instructed by your health care provider. Avoid heavy or fried foods that are hard to digest. °· Avoid drinking alcohol for 24 hours or as instructed by your health care provider. °· Only take over-the-counter or prescription medicines as directed by your health care provider. °· If a tissue sample (biopsy) was taken during your procedure: °¨ Do not take aspirin or blood thinners for 7 days, or as instructed by your health care provider. °¨ Do not drink alcohol for 7 days, or as instructed by your health care provider. °¨ Eat soft foods for the first 24 hours. °SEEK MEDICAL CARE IF: °You have persistent spotting of blood in your stool 2-3 days after the procedure. °SEEK IMMEDIATE MEDICAL CARE IF: °· You have more than a small spotting of blood in your stool. °· You pass large blood clots in your stool. °· Your abdomen is swollen  (distended). °· You have nausea or vomiting. °· You have a fever. °· You have increasing abdominal pain that is not relieved with medicine. °  °This information is not intended to replace advice given to you by your health care provider. Make sure you discuss any questions you have with your health care provider. °  °Document Released: 01/27/2004 Document Revised: 04/04/2013 Document Reviewed: 02/19/2013 °Elsevier Interactive Patient Education ©2016 Elsevier Inc. ° °

## 2015-04-15 NOTE — Progress Notes (Signed)
Pt blood pressure  86/51.  Dr Lovell SheehanJenkins notified.  Verbal order to infuse additional fluids to help increase blood pressure.  1000 cc bolus on 0.9 NS started.  Pt with no complaints of dizziness; skin color pink, dry.

## 2015-04-15 NOTE — Op Note (Signed)
[facility] [address] [place], [id number]   COLONOSCOPY PROCEDURE REPORT     EXAM DATE: 04/15/2015  PATIENT NAME:      Kelli Summers, Kelli Summers           MR #:      [id number]  BIRTHDATE:       Oct 17, 1955      VISIT #:     [id number]  ATTENDING:     Franky MachoMark Corinthian Mizrahi, Kelli Summers     STATUS:     outpatient ASSISTANT:  INDICATIONS:  The patient is a 59 yr old female here for a colonoscopy due to average risk patient for colon cancer. PROCEDURE PERFORMED:     Colonoscopy, screening MEDICATIONS:     Demerol 75 mg IV and Versed 5 mg IV ESTIMATED BLOOD LOSS:     None  CONSENT: The patient understands the risks and benefits of the procedure and understands that these risks include, but are not limited to: sedation, allergic reaction, infection, perforation and/or bleeding. Alternative means of evaluation and treatment include, among others: physical exam, x-rays, and/or surgical intervention. The patient elects to proceed with this endoscopic procedure.  DESCRIPTION OF PROCEDURE: During intra-op preparation period all mechanical & medical equipment was checked for proper function. Hand hygiene and appropriate measures for infection prevention was taken. After the risks, benefits and alternatives of the procedure were thoroughly explained, Informed consent was verified, confirmed and timeout was successfully executed by the treatment team. A digital exam revealed internal hemorrhoids. The EC-[id number] ([id number] endoscope was introduced through the anus and advanced to the cecum, which was identified by both the appendix and ileocecal valve. adequate (Trilyte was used) The instrument was then slowly withdrawn as the colon was fully examined.Estimated blood loss is zero unless otherwise noted in this procedure report.   COLON FINDINGS: There was moderate diverticulosis noted in the sigmoid colon.   The examination was otherwise normal. Retroflexed views revealed internal Grade I  hemorrhoids. The scope was then completely withdrawn from the patient and the procedure terminated.  SCOPE WITHDRAWAL TIME: 6    ADVERSE EVENTS:      There were no immediate complications.  IMPRESSIONS:     1.  Moderate diverticulosis was noted in the sigmoid colon 2.  The examination was otherwise normal  RECOMMENDATIONS:     Repeat Colonscopy in 10 years. RECALL:  _____________________________ Kelli MachoMark Rojean Ige, Kelli Summers eSigned:  Franky MachoMark Arzella Rehmann, Kelli Summers 04/15/2015 7:51 AM   cc:   CPT CODES: ICD CODES:  The ICD and CPT codes recommended by this software are interpretations from the data that the clinical staff has captured with the software.  The verification of the translation of this report to the ICD and CPT codes and modifiers is the sole responsibility of the health care institution and practicing physician where this report was generated.  [employer] will not be held responsible for the validity of the ICD and CPT codes included on this report.  AMA assumes no liability for data contained or not contained herein. CPT is a Publishing rights managerregistered trademark of the [employer].

## 2015-04-15 NOTE — Interval H&P Note (Signed)
History and Physical Interval Note:  04/15/2015 7:30 AM  Kelli Summers  has presented today for surgery, with the diagnosis of screening  The various methods of treatment have been discussed with the patient and family. After consideration of risks, benefits and other options for treatment, the patient has consented to  Procedure(s): COLONOSCOPY (N/A) as a surgical intervention .  The patient's history has been reviewed, patient examined, no change in status, stable for surgery.  I have reviewed the patient's chart and labs.  Questions were answered to the patient's satisfaction.     Franky MachoJENKINS,Narayan Scull A

## 2015-04-18 ENCOUNTER — Encounter (HOSPITAL_COMMUNITY): Payer: Self-pay | Admitting: General Surgery

## 2016-03-10 ENCOUNTER — Other Ambulatory Visit: Payer: Self-pay | Admitting: Family Medicine

## 2016-03-10 DIAGNOSIS — E108 Type 1 diabetes mellitus with unspecified complications: Secondary | ICD-10-CM

## 2016-03-19 ENCOUNTER — Other Ambulatory Visit: Payer: Self-pay | Admitting: Family Medicine

## 2016-03-19 ENCOUNTER — Ambulatory Visit
Admission: RE | Admit: 2016-03-19 | Discharge: 2016-03-19 | Disposition: A | Discharge status: Home / Self Care | Payer: 59 | Source: Ambulatory Visit | Attending: Family Medicine | Admitting: Family Medicine

## 2016-03-19 DIAGNOSIS — E108 Type 1 diabetes mellitus with unspecified complications: Secondary | ICD-10-CM

## 2016-03-19 DIAGNOSIS — Z1231 Encounter for screening mammogram for malignant neoplasm of breast: Secondary | ICD-10-CM

## 2016-03-31 NOTE — Patient Instructions (Signed)
Ansel BongVicky P Selner  03/31/2016     @PREFPERIOPPHARMACY @   Your procedure is scheduled on  04/02/2016   [facility]  [facility].M.  Call this number if you have problems the morning of surgery:  [phone]   Remember:  Do not eat food or drink liquids after midnight.  Take these medicines the morning of surgery with A SIP OF WATER  Cymbalta, cozaar, mobic, metoprolol.   Do not wear jewelry, make-up or nail polish.  Do not wear lotions, powders, or perfumes, or deoderant.  Do not shave 48 hours prior to surgery.  Men may shave face and neck.  Do not bring valuables to the hospital.  [facility] not [facility] for any belongings or valuables.  Contacts, dentures or bridgework may not be worn into surgery.  Leave your suitcase in the car.  After surgery it may be brought to your room.  For patients admitted to the hospital, discharge time will be determined by your treatment team.  Patients discharged the day of surgery will not be allowed to drive home.   Name and phone number of your driver:   family Special instructions:  none  Please read over the following fact sheets that you were given. Anesthesia Post-op Instructions and Care and Recovery After Surgery      Sebaceous Cyst Removal Sebaceous cyst removal is a procedure to remove a sac of oily material that forms under your skin (sebaceous cyst). Sebaceous cysts may also be called epidermoid cysts or keratin cysts. Normally, the skin secretes this oily material through a gland or a hair follicle. This type of cyst usually results when a skin gland or hair follicle becomes blocked. You may need this procedure if you have a sebaceous cyst that becomes large, uncomfortable, or infected. LET [facility] PROVIDER KNOW ABOUT:  Any allergies you have.  All medicines you are taking, including vitamins, herbs, eye drops, creams, and over-the-counter medicines.  Previous problems you or members of your  family have had with the use of anesthetics.  Any blood disorders you have.  Previous surgeries you have had.  Medical conditions you have. RISKS AND COMPLICATIONS Generally, this is a safe procedure. However, problems may occur, including:  Developing another cyst.  Bleeding.  Infection.  Scarring. BEFORE THE PROCEDURE  Ask your health care provider about:  Changing or stopping your regular medicines. This is especially important if you are taking diabetes medicines or blood thinners.  Taking medicines such as aspirin and ibuprofen. These medicines can thin your blood. Do not take these medicines before your procedure if your health care provider instructs you not to.  If you have an infected cyst, you may have to take antibiotic medicines before or after the cyst removal. Take your antibiotics as directed by your health care provider. Finish all of the medicine even if you start to feel better.  Take a shower on the morning of your procedure. Your health care provider may ask you to use a germ-killing (antiseptic) soap. PROCEDURE  You will be given a medicine that numbs the area (local anesthetic).  The skin around the cyst will be cleaned with a germ-killing solution (antiseptic).  Your health care provider will make a small surgical incision over the cyst.  The cyst will be separated from the surrounding tissues that are under your skin.  If possible, the cyst will be removed undamaged (intact).  If the cyst  bursts (ruptures), it will need to be removed in pieces.  After the cyst is removed, your health care provider will control any bleeding and close the incision with small stitches (sutures). Small incisions may not need sutures, and the bleeding will be controlled by applying direct pressure with gauze.  Your health care provider may apply antibiotic ointment and a light bandage (dressing) over the incision. This procedure may vary among health care providers and  hospitals. AFTER THE PROCEDURE  If your cyst ruptured during surgery, you may need to take antibiotic medicine. If you were prescribed an antibiotic medicine, finish all of it even if you start to feel better.   This information is not intended to replace advice given to you by your health care provider. Make sure you discuss any questions you have with your health care provider.   Document Released: 06/11/2000 Document Revised: 07/05/2014 Document Reviewed: 02/27/2014 Elsevier Interactive Patient Education 2016 Elsevier Inc. Sebaceous Cyst Removal, Care After Refer to this sheet in the next few weeks. These instructions provide you with information about caring for yourself after your procedure. Your health care provider may also give you more specific instructions. Your treatment has been planned according to current medical practices, but problems sometimes occur. Call your health care provider if you have any problems or questions after your procedure. WHAT TO EXPECT AFTER THE PROCEDURE After your procedure, it is common to have:  Soreness in the area where your cyst was removed.  Tightness or itching from your skin sutures. HOME CARE INSTRUCTIONS  Take medicines only as directed by your health care provider.  If you were prescribed an antibiotic medicine, finish all of it even if you start to feel better.  Use antibiotic ointment as directed by your health care provider. Follow the instructions carefully.  There are many different ways to close and cover an incision, including stitches (sutures), skin glue, and adhesive strips. Follow your health care provider's instructions about:  Incision care.  Bandage (dressing) changes and removal.  Incision closure removal.  Keep the bandage (dressing) dry until your health care provider says that it can be removed. Take sponge baths only. Ask your health care provider when you can start showering or taking a bath.  After your dressing  is off, check your incision every day for signs of infection. Watch for:  Redness, swelling, or pain.  Fluid, blood, or pus.  You can return to your normal activities. Do not do anything that stretches or puts pressure on your incision.  You can return to your normal diet.  Keep all follow-up visits as directed by your health care provider. This is important. SEEK MEDICAL CARE IF:  You have a fever.  Your incision bleeds.  You have redness, swelling, or pain in the incision area.  You have fluid, blood, or pus coming from your incision.  Your cyst comes back after surgery.   This information is not intended to replace advice given to you by your health care provider. Make sure you discuss any questions you have with your health care provider.   Document Released: 07/05/2014 Document Reviewed: 07/05/2014 Elsevier Interactive Patient Education 2016 Elsevier Inc. PATIENT INSTRUCTIONS POST-ANESTHESIA  IMMEDIATELY FOLLOWING SURGERY:  Do not drive or operate machinery for the first twenty four hours after surgery.  Do not make any important decisions for twenty four hours after surgery or while taking narcotic pain medications or sedatives.  If you develop intractable nausea and vomiting or a severe headache please notify  your doctor immediately.  FOLLOW-UP:  Please make an appointment with your surgeon as instructed. You do not need to follow up with anesthesia unless specifically instructed to do so.  WOUND CARE INSTRUCTIONS (if applicable):  Keep a dry clean dressing on the anesthesia/puncture wound site if there is drainage.  Once the wound has quit draining you may leave it open to air.  Generally you should leave the bandage intact for twenty four hours unless there is drainage.  If the epidural site drains for more than 36-48 hours please call the anesthesia department.  QUESTIONS?:  Please feel free to call your physician or the hospital operator if you have any questions, and  they will be happy to assist you.

## 2016-03-31 NOTE — H&P (Signed)
  NTS SOAP Note  Vital Signs:  Vitals as of: 03/30/2016: Systolic 131: Diastolic 73: Heart Rate 101: Temp 97.29F (Temporal): Height 305ft 4in: Weight 213Lbs 0 Ounces: BMI 36.56   BMI : 36.56 kg/m2  Subjective: This 60 year old female presents for of a cyst on her face.  Has been present for some time, but is increasing in size and causing her discomfort.  Sometimes drains white chunks.  No fevers noted.  Never goes away.  Has had cysts removed on back in the past.  Review of Symptoms:  Constitutional:negative Head:negative Eyes:negative Nose/Mouth/Throat:negative Cardiovascular:negative Respiratory:negative Gastrointestinnegative Genitourinary:negative Musculoskeletal:negative dry Hematolgic/Lymphatic:negative Allergic/Immunologic:negative   Past Medical History:Reviewed  Past Medical History  Surgical History: i and d cysts on back in remote past Medical Problems: HTN, NIDDM Allergies: PCN, cipro, vicodin Medications: metoprolol, losartan, glipizide, cymbalta, glyxambi, toujeo, baby asa   Social History:Reviewed  Social History  Preferred Language: English Race:  White Ethnicity: Not Hispanic / Latino Age: [age] year Marital Status:  M Alcohol: no   Smoking Status: Never smoker reviewed on 03/30/2016 Functional Status reviewed on 03/30/2016 ------------------------------------------------ Bathing: Normal Cooking: Normal Dressing: Normal Driving: Normal Eating: Normal Managing Meds: Normal Oral Care: Normal Shopping: Normal Toileting: Normal Transferring: Normal Walking: Normal Cognitive Status reviewed on 03/30/2016 ------------------------------------------------ Attention: Normal Decision Making: Normal Language: Normal Memory: Normal Motor: Normal Perception: Normal Problem Solving: Normal Visual and Spatial: Normal   Family History:Reviewed  Family Health History Mother, Deceased; Stroke (CVA);  Father, Living; Diabetes  mellitus, unspecified type; Heart disease;     Objective Information: General:Well appearing, well nourished in no distress. 1cm cyst with punctum present left temple of face, tender to palpation, no purulent drainage noted.  Slight erythema present. Head:Atraumatic; no masses; no abnormalities Neck:Supple without lymphadenopathy.  Heart:RRR, no murmur or gallop.  Normal S1, S2.  No S3, S4.  Lungs:CTA bilaterally, no wheezes, rhonchi, rales.  Breathing unlabored.  Assessment:Sebaceous cyst, face, 1cm  Diagnoses: 706.2  L72.3 Epidermoid cyst of skin (Sebaceous cyst)  Procedures: [id number] - OFFICE OUTPATIENT NEW 30 MINUTES    Plan:  Scheduled for excision of sebaceous cyst, face on 04/05/16.   Patient Education:Alternative treatments to surgery were discussed with patient (and family).Risks and benefits  of procedure including bleeding, infection, and recurrence of the cyst were fully explained to the patient (and family) who gave informed consent. Patient/family questions were addressed.  Follow-up:Pending Surgery

## 2016-04-01 ENCOUNTER — Other Ambulatory Visit: Payer: Self-pay

## 2016-04-01 ENCOUNTER — Encounter (HOSPITAL_COMMUNITY)
Admission: RE | Admit: 2016-04-01 | Discharge: 2016-04-01 | Disposition: A | Discharge status: Home / Self Care | Payer: 59 | Source: Ambulatory Visit | Attending: General Surgery | Admitting: General Surgery

## 2016-04-01 ENCOUNTER — Encounter (HOSPITAL_COMMUNITY): Payer: Self-pay

## 2016-04-01 DIAGNOSIS — Z823 Family history of stroke: Secondary | ICD-10-CM | POA: Diagnosis not present

## 2016-04-01 DIAGNOSIS — I10 Essential (primary) hypertension: Secondary | ICD-10-CM | POA: Diagnosis not present

## 2016-04-01 DIAGNOSIS — Z881 Allergy status to other antibiotic agents status: Secondary | ICD-10-CM | POA: Diagnosis not present

## 2016-04-01 DIAGNOSIS — G8929 Other chronic pain: Secondary | ICD-10-CM | POA: Diagnosis not present

## 2016-04-01 DIAGNOSIS — F329 Major depressive disorder, single episode, unspecified: Secondary | ICD-10-CM | POA: Diagnosis not present

## 2016-04-01 DIAGNOSIS — Z79899 Other long term (current) drug therapy: Secondary | ICD-10-CM | POA: Diagnosis not present

## 2016-04-01 DIAGNOSIS — E119 Type 2 diabetes mellitus without complications: Secondary | ICD-10-CM | POA: Diagnosis not present

## 2016-04-01 DIAGNOSIS — J45909 Unspecified asthma, uncomplicated: Secondary | ICD-10-CM | POA: Diagnosis not present

## 2016-04-01 DIAGNOSIS — Z880 Allergy status to penicillin: Secondary | ICD-10-CM | POA: Diagnosis not present

## 2016-04-01 DIAGNOSIS — Z7982 Long term (current) use of aspirin: Secondary | ICD-10-CM | POA: Diagnosis not present

## 2016-04-01 DIAGNOSIS — Z833 Family history of diabetes mellitus: Secondary | ICD-10-CM | POA: Diagnosis not present

## 2016-04-01 DIAGNOSIS — M545 Low back pain: Secondary | ICD-10-CM | POA: Diagnosis not present

## 2016-04-01 DIAGNOSIS — Z87891 Personal history of nicotine dependence: Secondary | ICD-10-CM | POA: Diagnosis not present

## 2016-04-01 DIAGNOSIS — Z888 Allergy status to other drugs, medicaments and biological substances status: Secondary | ICD-10-CM | POA: Diagnosis not present

## 2016-04-01 DIAGNOSIS — Z6836 Body mass index (BMI) 36.0-36.9, adult: Secondary | ICD-10-CM | POA: Diagnosis not present

## 2016-04-01 DIAGNOSIS — M1990 Unspecified osteoarthritis, unspecified site: Secondary | ICD-10-CM | POA: Diagnosis not present

## 2016-04-01 DIAGNOSIS — Z8249 Family history of ischemic heart disease and other diseases of the circulatory system: Secondary | ICD-10-CM | POA: Diagnosis not present

## 2016-04-01 DIAGNOSIS — L723 Sebaceous cyst: Secondary | ICD-10-CM | POA: Diagnosis present

## 2016-04-01 HISTORY — DX: Unspecified osteoarthritis, unspecified site: M19.90

## 2016-04-01 LAB — CBC WITH DIFFERENTIAL/PLATELET
Basophils Absolute: 0 10*3/uL (ref 0.0–0.1)
Basophils Relative: 0 %
Eosinophils Absolute: 0.3 10*3/uL (ref 0.0–0.7)
Eosinophils Relative: 4 %
HCT: 40.9 % (ref 36.0–46.0)
Hemoglobin: 13.8 g/dL (ref 12.0–15.0)
Lymphocytes Relative: 30 %
Lymphs Abs: 2.2 10*3/uL (ref 0.7–4.0)
MCH: 30.9 pg (ref 26.0–34.0)
MCHC: 33.7 g/dL (ref 30.0–36.0)
MCV: 91.7 fL (ref 78.0–100.0)
Monocytes Absolute: 0.4 10*3/uL (ref 0.1–1.0)
Monocytes Relative: 5 %
Neutro Abs: 4.5 10*3/uL (ref 1.7–7.7)
Neutrophils Relative %: 61 %
Platelets: 168 10*3/uL (ref 150–400)
RBC: 4.46 MIL/uL (ref 3.87–5.11)
RDW: 13.5 % (ref 11.5–15.5)
WBC: 7.4 10*3/uL (ref 4.0–10.5)

## 2016-04-01 LAB — BASIC METABOLIC PANEL
Anion gap: 7 (ref 5–15)
BUN: 21 mg/dL — ABNORMAL HIGH (ref 6–20)
CO2: 26 mmol/L (ref 22–32)
Calcium: 8.8 mg/dL — ABNORMAL LOW (ref 8.9–10.3)
Chloride: 105 mmol/L (ref 101–111)
Creatinine, Ser: 1.25 mg/dL — ABNORMAL HIGH (ref 0.44–1.00)
GFR calc Af Amer: 53 mL/min — ABNORMAL LOW (ref >60)
GFR calc non Af Amer: 46 mL/min — ABNORMAL LOW (ref >60)
Glucose, Bld: 149 mg/dL — ABNORMAL HIGH (ref 65–99)
Potassium: 3.8 mmol/L (ref 3.5–5.1)
Sodium: 138 mmol/L (ref 135–145)

## 2016-04-01 NOTE — Pre-Procedure Instructions (Signed)
Patient given information to sign up for my chart at home. 

## 2016-04-02 ENCOUNTER — Encounter (HOSPITAL_COMMUNITY): Payer: Self-pay | Admitting: *Deleted

## 2016-04-02 ENCOUNTER — Ambulatory Visit (HOSPITAL_COMMUNITY)
Admission: RE | Admit: 2016-04-02 | Discharge: 2016-04-02 | Disposition: A | Discharge status: Home / Self Care | Payer: 59 | Source: Ambulatory Visit | Attending: General Surgery | Admitting: General Surgery

## 2016-04-02 ENCOUNTER — Ambulatory Visit (HOSPITAL_COMMUNITY): Payer: 59 | Admitting: Anesthesiology

## 2016-04-02 ENCOUNTER — Encounter (HOSPITAL_COMMUNITY)
Admission: RE | Disposition: A | Discharge status: Home / Self Care | Payer: Self-pay | Source: Ambulatory Visit | Attending: General Surgery

## 2016-04-02 DIAGNOSIS — E119 Type 2 diabetes mellitus without complications: Secondary | ICD-10-CM | POA: Insufficient documentation

## 2016-04-02 DIAGNOSIS — G8929 Other chronic pain: Secondary | ICD-10-CM | POA: Insufficient documentation

## 2016-04-02 DIAGNOSIS — Z881 Allergy status to other antibiotic agents status: Secondary | ICD-10-CM | POA: Insufficient documentation

## 2016-04-02 DIAGNOSIS — M1990 Unspecified osteoarthritis, unspecified site: Secondary | ICD-10-CM | POA: Insufficient documentation

## 2016-04-02 DIAGNOSIS — F329 Major depressive disorder, single episode, unspecified: Secondary | ICD-10-CM | POA: Insufficient documentation

## 2016-04-02 DIAGNOSIS — Z823 Family history of stroke: Secondary | ICD-10-CM | POA: Insufficient documentation

## 2016-04-02 DIAGNOSIS — Z880 Allergy status to penicillin: Secondary | ICD-10-CM | POA: Insufficient documentation

## 2016-04-02 DIAGNOSIS — L723 Sebaceous cyst: Secondary | ICD-10-CM | POA: Diagnosis not present

## 2016-04-02 DIAGNOSIS — M545 Low back pain: Secondary | ICD-10-CM | POA: Insufficient documentation

## 2016-04-02 DIAGNOSIS — Z8249 Family history of ischemic heart disease and other diseases of the circulatory system: Secondary | ICD-10-CM | POA: Insufficient documentation

## 2016-04-02 DIAGNOSIS — Z7982 Long term (current) use of aspirin: Secondary | ICD-10-CM | POA: Insufficient documentation

## 2016-04-02 DIAGNOSIS — Z6836 Body mass index (BMI) 36.0-36.9, adult: Secondary | ICD-10-CM | POA: Insufficient documentation

## 2016-04-02 DIAGNOSIS — Z833 Family history of diabetes mellitus: Secondary | ICD-10-CM | POA: Insufficient documentation

## 2016-04-02 DIAGNOSIS — J45909 Unspecified asthma, uncomplicated: Secondary | ICD-10-CM | POA: Insufficient documentation

## 2016-04-02 DIAGNOSIS — I10 Essential (primary) hypertension: Secondary | ICD-10-CM | POA: Insufficient documentation

## 2016-04-02 DIAGNOSIS — Z87891 Personal history of nicotine dependence: Secondary | ICD-10-CM | POA: Insufficient documentation

## 2016-04-02 DIAGNOSIS — Z888 Allergy status to other drugs, medicaments and biological substances status: Secondary | ICD-10-CM | POA: Insufficient documentation

## 2016-04-02 DIAGNOSIS — Z79899 Other long term (current) drug therapy: Secondary | ICD-10-CM | POA: Insufficient documentation

## 2016-04-02 HISTORY — PX: CYST EXCISION: SHX5701

## 2016-04-02 LAB — GLUCOSE, CAPILLARY
Glucose-Capillary: 185 mg/dL — ABNORMAL HIGH (ref 65–99)
Glucose-Capillary: 166 mg/dL — ABNORMAL HIGH (ref 65–99)

## 2016-04-02 SURGERY — CYST REMOVAL
Anesthesia: General | Site: Face | Laterality: Left

## 2016-04-02 MED ORDER — TRAMADOL HCL 50 MG PO TABS: 50.0000 mg | ORAL_TABLET | Freq: Four times a day (QID) | ORAL | 0 refills | Status: DC | PRN

## 2016-04-02 MED ORDER — LIDOCAINE HCL (CARDIAC) 20 MG/ML IV SOLN
INTRAVENOUS | Status: DC | PRN
  Administered 2016-04-02: 40 mg via INTRAVENOUS

## 2016-04-02 MED ORDER — POVIDONE-IODINE 10 % EX OINT
TOPICAL_OINTMENT | CUTANEOUS | Status: AC
  Filled 2016-04-02: qty 1

## 2016-04-02 MED ORDER — BACITRACIN ZINC 500 UNIT/GM EX OINT
TOPICAL_OINTMENT | CUTANEOUS | Status: AC
  Filled 2016-04-02: qty 0.9

## 2016-04-02 MED ORDER — ONDANSETRON HCL 4 MG/2ML IJ SOLN
INTRAMUSCULAR | Status: AC
  Filled 2016-04-02: qty 2

## 2016-04-02 MED ORDER — CHLORHEXIDINE GLUCONATE CLOTH 2 % EX PADS: 6.0000 | MEDICATED_PAD | Freq: Once | CUTANEOUS | Status: DC

## 2016-04-02 MED ORDER — FENTANYL CITRATE (PF) 100 MCG/2ML IJ SOLN
INTRAMUSCULAR | Status: AC
  Filled 2016-04-02: qty 2

## 2016-04-02 MED ORDER — BACITRACIN 500 UNIT/GM EX OINT
TOPICAL_OINTMENT | CUTANEOUS | Status: DC | PRN
  Administered 2016-04-02: 1 via TOPICAL

## 2016-04-02 MED ORDER — KETOROLAC TROMETHAMINE 30 MG/ML IJ SOLN
30.0000 mg | Freq: Once | INTRAMUSCULAR | Status: AC
  Administered 2016-04-02: 30 mg via INTRAVENOUS

## 2016-04-02 MED ORDER — SUCCINYLCHOLINE CHLORIDE 20 MG/ML IJ SOLN
INTRAMUSCULAR | Status: AC
  Filled 2016-04-02: qty 1

## 2016-04-02 MED ORDER — BUPIVACAINE HCL (PF) 0.5 % IJ SOLN
INTRAMUSCULAR | Status: AC
  Filled 2016-04-02: qty 30

## 2016-04-02 MED ORDER — ONDANSETRON HCL 4 MG/2ML IJ SOLN
4.0000 mg | Freq: Once | INTRAMUSCULAR | Status: AC
  Administered 2016-04-02: 4 mg via INTRAVENOUS

## 2016-04-02 MED ORDER — FENTANYL CITRATE (PF) 100 MCG/2ML IJ SOLN
25.0000 ug | INTRAMUSCULAR | Status: AC | PRN
  Administered 2016-04-02 (×2): 25 ug via INTRAVENOUS

## 2016-04-02 MED ORDER — KETOROLAC TROMETHAMINE 30 MG/ML IJ SOLN
INTRAMUSCULAR | Status: AC
  Filled 2016-04-02: qty 1

## 2016-04-02 MED ORDER — FENTANYL CITRATE (PF) 100 MCG/2ML IJ SOLN
INTRAMUSCULAR | Status: DC | PRN
  Administered 2016-04-02 (×2): 25 ug via INTRAVENOUS

## 2016-04-02 MED ORDER — LACTATED RINGERS IV SOLN
INTRAVENOUS | Status: DC | PRN
  Administered 2016-04-02: 1000 mL

## 2016-04-02 MED ORDER — BUPIVACAINE HCL 0.5 % IJ SOLN
INTRAMUSCULAR | Status: DC | PRN
  Administered 2016-04-02: 2 mL

## 2016-04-02 MED ORDER — MIDAZOLAM HCL 2 MG/2ML IJ SOLN
INTRAMUSCULAR | Status: AC
  Filled 2016-04-02: qty 2

## 2016-04-02 MED ORDER — HYDROMORPHONE HCL 1 MG/ML IJ SOLN: 0.2500 mg | INTRAMUSCULAR | Status: DC | PRN

## 2016-04-02 MED ORDER — LIDOCAINE HCL (PF) 1 % IJ SOLN
INTRAMUSCULAR | Status: AC
  Filled 2016-04-02: qty 5

## 2016-04-02 MED ORDER — PROPOFOL 10 MG/ML IV BOLUS
INTRAVENOUS | Status: AC
  Filled 2016-04-02: qty 40

## 2016-04-02 MED ORDER — MIDAZOLAM HCL 2 MG/2ML IJ SOLN
1.0000 mg | INTRAMUSCULAR | Status: DC | PRN
  Administered 2016-04-02: 2 mg via INTRAVENOUS

## 2016-04-02 MED ORDER — SODIUM CHLORIDE 0.9 % IR SOLN
Status: DC | PRN
  Administered 2016-04-02: 500 mL

## 2016-04-02 MED ORDER — PHENYLEPHRINE HCL 10 MG/ML IJ SOLN
INTRAMUSCULAR | Status: DC | PRN
  Administered 2016-04-02: 40 ug via INTRAVENOUS

## 2016-04-02 MED ORDER — LACTATED RINGERS IV SOLN: INTRAVENOUS | Status: DC

## 2016-04-02 SURGICAL SUPPLY — 26 items
BAG HAMPER (MISCELLANEOUS) ×2 IMPLANT
CLOTH BEACON ORANGE TIMEOUT ST (SAFETY) ×2 IMPLANT
COVER LIGHT HANDLE STERIS (MISCELLANEOUS) ×4 IMPLANT
DECANTER SPIKE VIAL GLASS SM (MISCELLANEOUS) ×2 IMPLANT
DRAPE EENT ADH APERT 31X51 STR (DRAPES) ×2 IMPLANT
DRSG TEGADERM 2-3/8X2-3/4 SM (GAUZE/BANDAGES/DRESSINGS) ×2 IMPLANT
ELECT NEEDLE TIP 2.8 STRL (NEEDLE) IMPLANT
ELECT REM PT RETURN 9FT ADLT (ELECTROSURGICAL) ×2
ELECTRODE REM PT RTRN 9FT ADLT (ELECTROSURGICAL) ×1 IMPLANT
GLOVE BIOGEL PI IND STRL 7.0 (GLOVE) ×1 IMPLANT
GLOVE BIOGEL PI INDICATOR 7.0 (GLOVE) ×1
GLOVE SURG SS PI 7.5 STRL IVOR (GLOVE) ×4 IMPLANT
GOWN STRL REUS W/TWL LRG LVL3 (GOWN DISPOSABLE) ×4 IMPLANT
KIT ROOM TURNOVER APOR (KITS) ×2 IMPLANT
NEEDLE HYPO 25X1 1.5 SAFETY (NEEDLE) ×2 IMPLANT
NS IRRIG 1000ML POUR BTL (IV SOLUTION) ×2 IMPLANT
PACK MINOR (CUSTOM PROCEDURE TRAY) IMPLANT
PAD ARMBOARD 7.5X6 YLW CONV (MISCELLANEOUS) ×2 IMPLANT
SET BASIN LINEN APH (SET/KITS/TRAYS/PACK) ×2 IMPLANT
SUT ETHILON 3 0 FSL (SUTURE) IMPLANT
SUT PROLENE 4 0 PS 2 18 (SUTURE) ×2 IMPLANT
SUT PROLENE 5 0 PS 3 (SUTURE) ×2 IMPLANT
SUT VIC AB 3-0 SH 27 (SUTURE)
SUT VIC AB 3-0 SH 27X BRD (SUTURE) IMPLANT
SUT VIC AB 4-0 PS2 27 (SUTURE) IMPLANT
SYRINGE 10CC LL (SYRINGE) ×2 IMPLANT

## 2016-04-02 NOTE — Anesthesia Procedure Notes (Signed)
Procedure Name: LMA Insertion Date/Time: 04/02/2016 7:43 AM Performed by: Pernell DupreADAMS, AMY A Pre-anesthesia Checklist: Patient identified, Timeout performed, Emergency Drugs available, Suction available and Patient being monitored Patient Re-evaluated:Patient Re-evaluated prior to inductionPreoxygenation: Pre-oxygenation with 100% oxygen Intubation Type: IV induction Ventilation: Mask ventilation without difficulty LMA: LMA inserted LMA Size: 4.0 Number of attempts: 1 Placement Confirmation: positive ETCO2 and breath sounds checked- equal and bilateral Tube secured with: Tape Dental Injury: Teeth and Oropharynx as per pre-operative assessment

## 2016-04-02 NOTE — Anesthesia Postprocedure Evaluation (Signed)
Anesthesia Post Note Late Entry for 0830  Patient: Kelli Summers  Procedure(s) Performed: Procedure(s) (LRB): EXCISION 1 CM CYST ON LEFT SIDE OF FACE (Left)  Patient location during evaluation: PACU Anesthesia Type: General Level of consciousness: awake and alert and oriented Pain management: pain level controlled Vital Signs Assessment: post-procedure vital signs reviewed and stable Respiratory status: spontaneous breathing Cardiovascular status: stable Postop Assessment: no signs of nausea or vomiting Anesthetic complications: no    Last Vitals:  Vitals:   04/02/16 0820 04/02/16 0845  BP:  135/78  Pulse:  83  Resp: 17 15  Temp: 36.8 C     Last Pain:  Vitals:   04/02/16 0700  TempSrc: Oral                 ADAMS, AMY A

## 2016-04-02 NOTE — Op Note (Signed)
Patient:  Kelli Summers  DOB:  02-04-1956  MRN:  [id number]   Preop Diagnosis:  Sebaceous cyst, 1 cm, face  Postop Diagnosis:  Same  Procedure:  Excision of 1 cm sebaceous cyst, face  Surgeon:  Franky MachoMark Moise Friday, M.D.  Anes:  Gen.  Indications:  Patient is a 60 year old white female who presents with an irritated sebaceous cyst on her face. The risks and benefits of the procedure including bleeding, infection, and recurrence of the cyst were fully explained to the patient, who gave informed consent.  Procedure note:  The patient was placed the supine position. After general anesthesia was administered, the left temple region was prepped and draped using usual sterile technique with Betadine. Surgical site confirmation was performed.  The cyst with a punctum was at the left temple hairline. A small amount here had been previously shaved. An elliptical incision was made to include the punctum. This was taken down to the cyst and the cyst was excised without difficulty. It was disposed of. Any bleeding was controlled at the skin level using Bovie cautery. 0.5% Sensorcaine was instilled into the surrounding wound. The wound was irrigated. The wound was closed with 5-0 nylon interrupted sutures. Triple antibiotic ointment was then applied.  All tape and needle counts were correct the end of the procedure. Patient was awakened and transferred to PACU in stable condition.  Complications:  None  EBL:  None  Specimen:  None

## 2016-04-02 NOTE — Discharge Instructions (Signed)
Sebaceous Cyst Removal, Care After °Refer to this sheet in the next few weeks. These instructions provide you with information about caring for yourself after your procedure. Your health care provider may also give you more specific instructions. Your treatment has been planned according to current medical practices, but problems sometimes occur. Call your health care provider if you have any problems or questions after your procedure. °WHAT TO EXPECT AFTER THE PROCEDURE °After your procedure, it is common to have: °· Soreness in the area where your cyst was removed. °· Tightness or itching from your skin sutures. °HOME CARE INSTRUCTIONS °· Take medicines only as directed by your health care provider. °· If you were prescribed an antibiotic medicine, finish all of it even if you start to feel better. °· Use antibiotic ointment as directed by your health care provider. Follow the instructions carefully. °· There are many different ways to close and cover an incision, including stitches (sutures), skin glue, and adhesive strips. Follow your health care provider's instructions about: °¨ Incision care. °¨ Bandage (dressing) changes and removal. °¨ Incision closure removal. °· Keep the bandage (dressing) dry until your health care provider says that it can be removed. Take sponge baths only. Ask your health care provider when you can start showering or taking a bath. °· After your dressing is off, check your incision every day for signs of infection. Watch for: °¨ Redness, swelling, or pain. °¨ Fluid, blood, or pus. °· You can return to your normal activities. Do not do anything that stretches or puts pressure on your incision. °· You can return to your normal diet. °· Keep all follow-up visits as directed by your health care provider. This is important. °SEEK MEDICAL CARE IF: °· You have a fever. °· Your incision bleeds. °· You have redness, swelling, or pain in the incision area. °· You have fluid, blood, or pus coming  from your incision. °· Your cyst comes back after surgery. °  °This information is not intended to replace advice given to you by your health care provider. Make sure you discuss any questions you have with your health care provider. °  °Document Released: 07/05/2014 Document Reviewed: 07/05/2014 °Elsevier Interactive Patient Education ©2016 Elsevier Inc. ° °

## 2016-04-02 NOTE — Transfer of Care (Signed)
Immediate Anesthesia Transfer of Care Note  Patient: Kelli Summers  Procedure(s) Performed: Procedure(s): EXCISION 1 CM CYST ON LEFT SIDE OF FACE (Left)  Patient Location: PACU  Anesthesia Type:General  Level of Consciousness: awake, alert , oriented and patient cooperative  Airway & Oxygen Therapy: Patient Spontanous Breathing  Post-op Assessment: Report given to RN and Post -op Vital signs reviewed and stable  Post vital signs: Reviewed and stable  Last Vitals:  Vitals:   04/02/16 0720 04/02/16 0730  BP:    Pulse:    Resp: 13 16  Temp:      Last Pain:  Vitals:   04/02/16 0700  TempSrc: Oral      Patients Stated Pain Goal: 3 (04/02/16 0700)  Complications: No apparent anesthesia complications

## 2016-04-02 NOTE — Anesthesia Preprocedure Evaluation (Signed)
Anesthesia Evaluation  Patient identified by MRN, date of birth, ID band Patient awake    Reviewed: Allergy & Precautions, NPO status , Patient's Chart, lab work & pertinent test results, reviewed documented beta blocker date and time   Airway Mallampati: I  TM Distance: >3 FB     Dental  (+) Teeth Intact   Pulmonary asthma , former smoker,    breath sounds clear to auscultation       Cardiovascular hypertension, Pt. on home beta blockers and Pt. on medications  Rhythm:Regular Rate:Normal     Neuro/Psych PSYCHIATRIC DISORDERS Depression    GI/Hepatic   Endo/Other  diabetes, Type obesity  Renal/GU      Musculoskeletal  (+) Arthritis  (chronic LBP),   Abdominal   Peds  Hematology   Anesthesia Other Findings   Reproductive/Obstetrics                             Anesthesia Physical Anesthesia Plan  ASA: III  Anesthesia Plan: General   Post-op Pain Management:    Induction: Intravenous  Airway Management Planned: LMA  Additional Equipment:   Intra-op Plan:   Post-operative Plan: Extubation in OR  Informed Consent: I have reviewed the patients History and Physical, chart, labs and discussed the procedure including the risks, benefits and alternatives for the proposed anesthesia with the patient or authorized representative who has indicated his/her understanding and acceptance.     Plan Discussed with: CRNA and Surgeon  Anesthesia Plan Comments: (Possible GOT)        Anesthesia Quick Evaluation

## 2016-04-02 NOTE — Interval H&P Note (Signed)
History and Physical Interval Note:  04/02/2016 7:15 AM  Kelli Summers  has presented today for surgery, with the diagnosis of 1 cm sebaceous cyst on left side of face  The various methods of treatment have been discussed with the patient and family. After consideration of risks, benefits and other options for treatment, the patient has consented to  Procedure(s): EXCISION 1 CM CYST ON LEFT SIDE OF FACE (Left) as a surgical intervention .  The patient's history has been reviewed, patient examined, no change in status, stable for surgery.  I have reviewed the patient's chart and labs.  Questions were answered to the patient's satisfaction.     Franky MachoJENKINS,Torrie Namba A

## 2016-04-07 ENCOUNTER — Encounter (HOSPITAL_COMMUNITY): Payer: Self-pay | Admitting: General Surgery

## 2016-07-08 DIAGNOSIS — Z1389 Encounter for screening for other disorder: Secondary | ICD-10-CM | POA: Diagnosis not present

## 2016-07-08 DIAGNOSIS — Z23 Encounter for immunization: Secondary | ICD-10-CM | POA: Diagnosis not present

## 2016-07-08 DIAGNOSIS — I10 Essential (primary) hypertension: Secondary | ICD-10-CM | POA: Diagnosis not present

## 2016-07-08 DIAGNOSIS — E1165 Type 2 diabetes mellitus with hyperglycemia: Secondary | ICD-10-CM | POA: Diagnosis not present

## 2016-10-07 DIAGNOSIS — E1165 Type 2 diabetes mellitus with hyperglycemia: Secondary | ICD-10-CM | POA: Diagnosis not present

## 2016-10-07 DIAGNOSIS — I10 Essential (primary) hypertension: Secondary | ICD-10-CM | POA: Diagnosis not present

## 2016-10-16 DIAGNOSIS — G8929 Other chronic pain: Secondary | ICD-10-CM | POA: Diagnosis not present

## 2016-10-16 DIAGNOSIS — M25511 Pain in right shoulder: Secondary | ICD-10-CM | POA: Diagnosis not present

## 2016-11-18 DIAGNOSIS — R6889 Other general symptoms and signs: Secondary | ICD-10-CM | POA: Diagnosis not present

## 2016-11-18 DIAGNOSIS — E1165 Type 2 diabetes mellitus with hyperglycemia: Secondary | ICD-10-CM | POA: Diagnosis not present

## 2016-11-18 DIAGNOSIS — J209 Acute bronchitis, unspecified: Secondary | ICD-10-CM | POA: Diagnosis not present

## 2016-11-18 DIAGNOSIS — J069 Acute upper respiratory infection, unspecified: Secondary | ICD-10-CM | POA: Diagnosis not present

## 2016-11-18 DIAGNOSIS — R070 Pain in throat: Secondary | ICD-10-CM | POA: Diagnosis not present

## 2016-11-18 DIAGNOSIS — E782 Mixed hyperlipidemia: Secondary | ICD-10-CM | POA: Diagnosis not present

## 2016-11-23 ENCOUNTER — Emergency Department (HOSPITAL_COMMUNITY): Payer: 59

## 2016-11-23 ENCOUNTER — Inpatient Hospital Stay (HOSPITAL_COMMUNITY)
Admission: EM | Admit: 2016-11-23 | Discharge: 2016-11-24 | DRG: 191 | Disposition: A | Discharge status: Home / Self Care | Payer: 59 | Attending: Internal Medicine | Admitting: Internal Medicine

## 2016-11-23 ENCOUNTER — Encounter (HOSPITAL_COMMUNITY): Payer: Self-pay | Admitting: Emergency Medicine

## 2016-11-23 DIAGNOSIS — D72829 Elevated white blood cell count, unspecified: Secondary | ICD-10-CM | POA: Diagnosis present

## 2016-11-23 DIAGNOSIS — Z885 Allergy status to narcotic agent status: Secondary | ICD-10-CM

## 2016-11-23 DIAGNOSIS — I10 Essential (primary) hypertension: Secondary | ICD-10-CM | POA: Diagnosis present

## 2016-11-23 DIAGNOSIS — N179 Acute kidney failure, unspecified: Secondary | ICD-10-CM | POA: Diagnosis not present

## 2016-11-23 DIAGNOSIS — E1122 Type 2 diabetes mellitus with diabetic chronic kidney disease: Secondary | ICD-10-CM | POA: Diagnosis present

## 2016-11-23 DIAGNOSIS — F329 Major depressive disorder, single episode, unspecified: Secondary | ICD-10-CM | POA: Diagnosis present

## 2016-11-23 DIAGNOSIS — Z881 Allergy status to other antibiotic agents status: Secondary | ICD-10-CM

## 2016-11-23 DIAGNOSIS — N182 Chronic kidney disease, stage 2 (mild): Secondary | ICD-10-CM | POA: Diagnosis not present

## 2016-11-23 DIAGNOSIS — F32A Depression, unspecified: Secondary | ICD-10-CM | POA: Diagnosis present

## 2016-11-23 DIAGNOSIS — E1165 Type 2 diabetes mellitus with hyperglycemia: Secondary | ICD-10-CM | POA: Diagnosis present

## 2016-11-23 DIAGNOSIS — J441 Chronic obstructive pulmonary disease with (acute) exacerbation: Secondary | ICD-10-CM | POA: Diagnosis not present

## 2016-11-23 DIAGNOSIS — M25569 Pain in unspecified knee: Secondary | ICD-10-CM | POA: Diagnosis present

## 2016-11-23 DIAGNOSIS — J4521 Mild intermittent asthma with (acute) exacerbation: Secondary | ICD-10-CM | POA: Diagnosis not present

## 2016-11-23 DIAGNOSIS — N183 Chronic kidney disease, stage 3 (moderate): Secondary | ICD-10-CM | POA: Diagnosis present

## 2016-11-23 DIAGNOSIS — Z880 Allergy status to penicillin: Secondary | ICD-10-CM | POA: Diagnosis not present

## 2016-11-23 DIAGNOSIS — Z79899 Other long term (current) drug therapy: Secondary | ICD-10-CM | POA: Diagnosis not present

## 2016-11-23 DIAGNOSIS — N289 Disorder of kidney and ureter, unspecified: Secondary | ICD-10-CM | POA: Diagnosis not present

## 2016-11-23 DIAGNOSIS — R7309 Other abnormal glucose: Secondary | ICD-10-CM | POA: Diagnosis present

## 2016-11-23 DIAGNOSIS — E860 Dehydration: Secondary | ICD-10-CM | POA: Diagnosis present

## 2016-11-23 DIAGNOSIS — E871 Hypo-osmolality and hyponatremia: Secondary | ICD-10-CM | POA: Diagnosis present

## 2016-11-23 DIAGNOSIS — R739 Hyperglycemia, unspecified: Secondary | ICD-10-CM | POA: Diagnosis not present

## 2016-11-23 DIAGNOSIS — Z792 Long term (current) use of antibiotics: Secondary | ICD-10-CM

## 2016-11-23 DIAGNOSIS — M1990 Unspecified osteoarthritis, unspecified site: Secondary | ICD-10-CM | POA: Diagnosis present

## 2016-11-23 DIAGNOSIS — Z791 Long term (current) use of non-steroidal anti-inflammatories (NSAID): Secondary | ICD-10-CM

## 2016-11-23 DIAGNOSIS — R0602 Shortness of breath: Secondary | ICD-10-CM | POA: Diagnosis not present

## 2016-11-23 DIAGNOSIS — Z794 Long term (current) use of insulin: Secondary | ICD-10-CM

## 2016-11-23 DIAGNOSIS — M549 Dorsalgia, unspecified: Secondary | ICD-10-CM | POA: Diagnosis present

## 2016-11-23 DIAGNOSIS — Z87891 Personal history of nicotine dependence: Secondary | ICD-10-CM

## 2016-11-23 DIAGNOSIS — Z886 Allergy status to analgesic agent status: Secondary | ICD-10-CM

## 2016-11-23 DIAGNOSIS — I129 Hypertensive chronic kidney disease with stage 1 through stage 4 chronic kidney disease, or unspecified chronic kidney disease: Secondary | ICD-10-CM | POA: Diagnosis present

## 2016-11-23 DIAGNOSIS — E119 Type 2 diabetes mellitus without complications: Secondary | ICD-10-CM | POA: Diagnosis not present

## 2016-11-23 DIAGNOSIS — E861 Hypovolemia: Secondary | ICD-10-CM | POA: Diagnosis present

## 2016-11-23 DIAGNOSIS — R05 Cough: Secondary | ICD-10-CM | POA: Diagnosis not present

## 2016-11-23 DIAGNOSIS — G8929 Other chronic pain: Secondary | ICD-10-CM | POA: Diagnosis present

## 2016-11-23 LAB — BASIC METABOLIC PANEL
Anion gap: 12 (ref 5–15)
BUN: 41 mg/dL — ABNORMAL HIGH (ref 6–20)
CO2: 19 mmol/L — ABNORMAL LOW (ref 22–32)
Calcium: 9.2 mg/dL (ref 8.9–10.3)
Chloride: 98 mmol/L — ABNORMAL LOW (ref 101–111)
Creatinine, Ser: 1.85 mg/dL — ABNORMAL HIGH (ref 0.44–1.00)
GFR calc Af Amer: 33 mL/min — ABNORMAL LOW (ref >60)
GFR calc non Af Amer: 28 mL/min — ABNORMAL LOW (ref >60)
Glucose, Bld: 512 mg/dL (ref 65–99)
Potassium: 3.7 mmol/L (ref 3.5–5.1)
Sodium: 129 mmol/L — ABNORMAL LOW (ref 135–145)

## 2016-11-23 LAB — I-STAT CHEM 8, ED
BUN: 37 mg/dL — ABNORMAL HIGH (ref 6–20)
Calcium, Ion: 1.19 mmol/L (ref 1.15–1.40)
Chloride: 104 mmol/L (ref 101–111)
Creatinine, Ser: 1.6 mg/dL — ABNORMAL HIGH (ref 0.44–1.00)
Glucose, Bld: 221 mg/dL — ABNORMAL HIGH (ref 65–99)
HCT: 41 % (ref 36.0–46.0)
Hemoglobin: 13.9 g/dL (ref 12.0–15.0)
Potassium: 3.7 mmol/L (ref 3.5–5.1)
Sodium: 133 mmol/L — ABNORMAL LOW (ref 135–145)
TCO2: 17 mmol/L (ref 0–100)

## 2016-11-23 LAB — GLUCOSE, CAPILLARY: Glucose-Capillary: 451 mg/dL — ABNORMAL HIGH (ref 65–99)

## 2016-11-23 LAB — CBC WITH DIFFERENTIAL/PLATELET
Basophils Absolute: 0.1 10*3/uL (ref 0.0–0.1)
Basophils Relative: 1 %
Eosinophils Absolute: 0.5 10*3/uL (ref 0.0–0.7)
Eosinophils Relative: 3 %
HCT: 40.6 % (ref 36.0–46.0)
Hemoglobin: 13.9 g/dL (ref 12.0–15.0)
Lymphocytes Relative: 22 %
Lymphs Abs: 3.6 10*3/uL (ref 0.7–4.0)
MCH: 30.9 pg (ref 26.0–34.0)
MCHC: 34.2 g/dL (ref 30.0–36.0)
MCV: 90.2 fL (ref 78.0–100.0)
Monocytes Absolute: 1.1 10*3/uL — ABNORMAL HIGH (ref 0.1–1.0)
Monocytes Relative: 7 %
Neutro Abs: 11.5 10*3/uL — ABNORMAL HIGH (ref 1.7–7.7)
Neutrophils Relative %: 67 %
Platelets: 251 10*3/uL (ref 150–400)
RBC: 4.5 MIL/uL (ref 3.87–5.11)
RDW: 13.6 % (ref 11.5–15.5)
WBC: 16.9 10*3/uL — ABNORMAL HIGH (ref 4.0–10.5)

## 2016-11-23 LAB — URINALYSIS, ROUTINE W REFLEX MICROSCOPIC
Bacteria, UA: NONE SEEN
Bilirubin Urine: NEGATIVE
Glucose, UA: >=500 mg/dL — AB
Hgb urine dipstick: NEGATIVE
Ketones, ur: NEGATIVE mg/dL
Nitrite: NEGATIVE
Protein, ur: NEGATIVE mg/dL
Specific Gravity, Urine: 1.024 (ref 1.005–1.030)
pH: 5 (ref 5.0–8.0)

## 2016-11-23 LAB — BRAIN NATRIURETIC PEPTIDE: B Natriuretic Peptide: 16 pg/mL (ref 0.0–100.0)

## 2016-11-23 LAB — CBG MONITORING, ED
Glucose-Capillary: 470 mg/dL — ABNORMAL HIGH (ref 65–99)
Glucose-Capillary: 356 mg/dL — ABNORMAL HIGH (ref 65–99)

## 2016-11-23 MED ORDER — DEXTROSE 5 % IV SOLN
500.0000 mg | INTRAVENOUS | Status: DC
  Administered 2016-11-23: 500 mg via INTRAVENOUS
  Filled 2016-11-23 (×2): qty 500

## 2016-11-23 MED ORDER — FLUCONAZOLE 100 MG PO TABS
150.0000 mg | ORAL_TABLET | Freq: Once | ORAL | Status: AC
  Administered 2016-11-23: 150 mg via ORAL
  Filled 2016-11-23: qty 2

## 2016-11-23 MED ORDER — ALBUTEROL SULFATE (2.5 MG/3ML) 0.083% IN NEBU
2.5000 mg | INHALATION_SOLUTION | Freq: Once | RESPIRATORY_TRACT | Status: AC
  Administered 2016-11-23: 2.5 mg via RESPIRATORY_TRACT
  Filled 2016-11-23: qty 3

## 2016-11-23 MED ORDER — IPRATROPIUM-ALBUTEROL 0.5-2.5 (3) MG/3ML IN SOLN
3.0000 mL | Freq: Once | RESPIRATORY_TRACT | Status: AC
  Administered 2016-11-23: 3 mL via RESPIRATORY_TRACT
  Filled 2016-11-23: qty 3

## 2016-11-23 MED ORDER — BISACODYL 5 MG PO TBEC: 5.0000 mg | DELAYED_RELEASE_TABLET | Freq: Every day | ORAL | Status: DC | PRN

## 2016-11-23 MED ORDER — ACETAMINOPHEN 650 MG RE SUPP: 650.0000 mg | Freq: Four times a day (QID) | RECTAL | Status: DC | PRN

## 2016-11-23 MED ORDER — DIPHENHYDRAMINE HCL 25 MG PO CAPS: 25.0000 mg | ORAL_CAPSULE | Freq: Four times a day (QID) | ORAL | Status: DC | PRN

## 2016-11-23 MED ORDER — INSULIN ASPART 100 UNIT/ML ~~LOC~~ SOLN
8.0000 [IU] | Freq: Once | SUBCUTANEOUS | Status: AC
  Administered 2016-11-23: 8 [IU] via SUBCUTANEOUS

## 2016-11-23 MED ORDER — IPRATROPIUM-ALBUTEROL 0.5-2.5 (3) MG/3ML IN SOLN: 3.0000 mL | RESPIRATORY_TRACT | Status: DC | PRN

## 2016-11-23 MED ORDER — METOPROLOL SUCCINATE ER 50 MG PO TB24
50.0000 mg | ORAL_TABLET | Freq: Every day | ORAL | Status: DC
  Administered 2016-11-24: 50 mg via ORAL
  Filled 2016-11-23: qty 1

## 2016-11-23 MED ORDER — ONDANSETRON HCL 4 MG PO TABS: 4.0000 mg | ORAL_TABLET | Freq: Four times a day (QID) | ORAL | Status: DC | PRN

## 2016-11-23 MED ORDER — PROMETHAZINE-CODEINE 6.25-10 MG/5ML PO SYRP
5.0000 mL | ORAL_SOLUTION | Freq: Once | ORAL | Status: AC
  Administered 2016-11-23: 5 mL via ORAL
  Filled 2016-11-23: qty 5

## 2016-11-23 MED ORDER — HYDROCODONE-ACETAMINOPHEN 5-325 MG PO TABS: 1.0000 | ORAL_TABLET | ORAL | Status: DC | PRN

## 2016-11-23 MED ORDER — SODIUM CHLORIDE 0.9% FLUSH
3.0000 mL | Freq: Two times a day (BID) | INTRAVENOUS | Status: DC
  Administered 2016-11-23 – 2016-11-24 (×2): 3 mL via INTRAVENOUS

## 2016-11-23 MED ORDER — ACETAMINOPHEN 325 MG PO TABS: 650.0000 mg | ORAL_TABLET | Freq: Four times a day (QID) | ORAL | Status: DC | PRN

## 2016-11-23 MED ORDER — DULOXETINE HCL 30 MG PO CPEP
60.0000 mg | ORAL_CAPSULE | Freq: Every day | ORAL | Status: DC
  Administered 2016-11-24: 60 mg via ORAL
  Filled 2016-11-23: qty 2

## 2016-11-23 MED ORDER — SODIUM CHLORIDE 0.9 % IV SOLN
INTRAVENOUS | Status: AC
  Administered 2016-11-23: 20:00:00 via INTRAVENOUS

## 2016-11-23 MED ORDER — INSULIN ASPART 100 UNIT/ML ~~LOC~~ SOLN
3.0000 [IU] | Freq: Three times a day (TID) | SUBCUTANEOUS | Status: DC
  Administered 2016-11-24 (×2): 3 [IU] via SUBCUTANEOUS

## 2016-11-23 MED ORDER — SODIUM CHLORIDE 0.9 % IV BOLUS (SEPSIS)
1000.0000 mL | Freq: Once | INTRAVENOUS | Status: AC
  Administered 2016-11-23: 1000 mL via INTRAVENOUS

## 2016-11-23 MED ORDER — INSULIN ASPART 100 UNIT/ML ~~LOC~~ SOLN: 0.0000 [IU] | Freq: Every day | SUBCUTANEOUS | Status: DC

## 2016-11-23 MED ORDER — HEPARIN SODIUM (PORCINE) 5000 UNIT/ML IJ SOLN
5000.0000 [IU] | Freq: Three times a day (TID) | INTRAMUSCULAR | Status: DC
  Administered 2016-11-23 – 2016-11-24 (×3): 5000 [IU] via SUBCUTANEOUS
  Filled 2016-11-23 (×3): qty 1

## 2016-11-23 MED ORDER — INSULIN ASPART 100 UNIT/ML ~~LOC~~ SOLN
10.0000 [IU] | Freq: Once | SUBCUTANEOUS | Status: AC
  Administered 2016-11-23: 10 [IU] via INTRAVENOUS
  Filled 2016-11-23 (×2): qty 1

## 2016-11-23 MED ORDER — INSULIN ASPART 100 UNIT/ML ~~LOC~~ SOLN
0.0000 [IU] | Freq: Three times a day (TID) | SUBCUTANEOUS | Status: DC
  Administered 2016-11-24: 5 [IU] via SUBCUTANEOUS
  Administered 2016-11-24: 8 [IU] via SUBCUTANEOUS

## 2016-11-23 MED ORDER — ONDANSETRON HCL 4 MG/2ML IJ SOLN: 4.0000 mg | Freq: Four times a day (QID) | INTRAMUSCULAR | Status: DC | PRN

## 2016-11-23 MED ORDER — PROMETHAZINE-CODEINE 6.25-10 MG/5ML PO SYRP
5.0000 mL | ORAL_SOLUTION | Freq: Four times a day (QID) | ORAL | Status: DC | PRN
  Administered 2016-11-23 – 2016-11-24 (×4): 5 mL via ORAL
  Filled 2016-11-23 (×4): qty 5

## 2016-11-23 MED ORDER — INSULIN GLARGINE 100 UNIT/ML ~~LOC~~ SOLN
24.0000 [IU] | Freq: Every day | SUBCUTANEOUS | Status: DC
  Administered 2016-11-23: 24 [IU] via SUBCUTANEOUS
  Filled 2016-11-23 (×2): qty 0.24

## 2016-11-23 MED ORDER — DEXTROSE 5 % IV SOLN
INTRAVENOUS | Status: AC
  Filled 2016-11-23: qty 500

## 2016-11-23 MED ORDER — POLYETHYLENE GLYCOL 3350 17 G PO PACK: 17.0000 g | PACK | Freq: Every day | ORAL | Status: DC | PRN

## 2016-11-23 NOTE — ED Notes (Signed)
Pharmacy called to bring Novolog.

## 2016-11-23 NOTE — ED Triage Notes (Addendum)
Patient complaining of non-productive cough x 1 week. States she was seen by PCP but feels no better. Also complaining of generalized body aches.

## 2016-11-23 NOTE — ED Provider Notes (Signed)
[facility] Provider Note   CSN: [id number] Arrival date & time: 11/23/16  4098     History   Chief Complaint Chief Complaint  Patient presents with  . Cough    HPI Kelli Summers is a 61 y.o. female with has medical history as outlined below including diabetes, asthma and empyema requiring surgical intervention in 2014 presenting with persistent cough, subjective fever and generalized fatigue which hasn't responded to 2 courses of antibiotics.  She was seen by her primary doctor and placed on Keflex which she completed, is currently taking doxycycline in addition to Tessalon and using OTC cough tablets which have not improved her symptoms. She was also started on a Breo inhaler.  She reports bilateral lower chest wall pain which is triggered by coughing, cough is nonproductive, she endorses positive orthopnea and shortness of breath.  She denies palpitations, dizziness but does endorse generalized weakness.  She is also noted increased urinary frequency without painful urination.  She has not checked her blood glucose today, but was around 150 yesterday morning.  The history is provided by the patient.    Past Medical History:  Diagnosis Date  . Arthritis   . Chronic back pain   . Chronic knee pain   . Depression   . Diabetes mellitus   . Ejection fraction   . History of stress test 01/2010   normal myoview stress test  . Hypertension   . Sinus tachycardia     Patient Active Problem List   Diagnosis Date Noted  . Moderate asthma 11/13/2013  . Smoking 11/13/2013  . Hypertension 10/15/2013  . Ejection fraction   . Sinus tachycardia   . Anemia, blood loss 05/10/2013  . Diabetes mellitus type 2, uncontrolled (HCC) 04/30/2013  . SHOULDER PAIN 03/02/2007  . DIABETES 02/14/2007    Past Surgical History:  Procedure Laterality Date  . ABDOMINAL HYSTERECTOMY    . BACK SURGERY    . CHOLECYSTECTOMY    . COLONOSCOPY N/A 04/15/2015   Procedure: COLONOSCOPY;  Surgeon:  Franky Macho, MD;  Location: [facility];  Service: Gastroenterology;  Laterality: N/A;  . CYST EXCISION Left 04/02/2016   Procedure: EXCISION 1 CM CYST ON LEFT SIDE OF FACE;  Surgeon: Franky Macho, MD;  Location: [facility] ORS;  Service: General;  Laterality: Left;  Marland Kitchen VIDEO ASSISTED THORACOSCOPY (VATS)/DECORTICATION Right 05/09/2013   Procedure: VIDEO ASSISTED THORACOSCOPY (VATS)/DECORTICATION;  Surgeon: Loreli Slot, MD;  Location: [facility] OR;  Service: Thoracic;  Laterality: Right;  . WEDGE RESECTION Right 05/09/2013   Procedure: WEDGE RESECTION RIGHT LOWER LOBE;  Surgeon: Loreli Slot, MD;  Location: [facility] OR;  Service: Thoracic;  Laterality: Right;    OB History    No data available       Home Medications    Prior to Admission medications   Medication Sig Start Date End Date Taking? Authorizing Provider  benzonatate (TESSALON) 100 MG capsule Take 100 mg by mouth 3 (three) times daily as needed for cough.   Yes [provider]  doxycycline (VIBRA-TABS) 100 MG tablet Take 100 mg by mouth 2 (two) times daily.   Yes [provider]  DULoxetine (CYMBALTA) 60 MG capsule Take 60 mg by mouth daily.   Yes [provider]  FARXIGA 10 MG TABS tablet Take 1 tablet by mouth daily. 11/12/16  Yes [provider]  losartan (COZAAR) 100 MG tablet Take 100 mg by mouth daily.  03/26/15  Yes [provider]  meloxicam (MOBIC) 15 MG tablet  Take 1 tablet by mouth daily. 03/24/16  Yes [provider]  metoprolol succinate (TOPROL-XL) 50 MG 24 hr tablet Take 1 tablet by mouth daily. 11/09/16  Yes [provider]  SOLIQUA 100-33 UNT-MCG/ML SOPN Inject 24 Units into the skin at bedtime. 11/16/16  Yes [provider]  glipiZIDE (GLUCOTROL) 10 MG tablet Take 1 tablet by mouth daily. 03/05/16   [provider]  GLYXAMBI 25-5 MG TABS Take 1 tablet by mouth daily. 03/06/15   [provider]  TOUJEO SOLOSTAR 300 UNIT/ML SOPN INJECT  34 UNITS PER INJECTOR AT BEDTIME SUBCUTANEOUSLY 04/03/15   [provider]  traMADol (ULTRAM) 50 MG tablet Take 1 tablet (50 mg total) by mouth every 6 (six) hours as needed. 04/02/16   Franky MachoJenkins, Mark, MD    Family History Family History  Problem Relation Age of Onset  . Emphysema Maternal Grandfather        smoked  . Breast cancer Maternal Aunt     Social History Social History  Substance Use Topics  . Smoking status: Former Smoker    Packs/day: 0.50    Years: 40.00    Types: Cigarettes    Quit date: 04/28/2012  . Smokeless tobacco: Former NeurosurgeonUser  . Alcohol use No     Allergies   Ciprofloxacin; Hydrocodone-acetaminophen; Penicillins; and Percocet [oxycodone-acetaminophen]   Review of Systems Review of Systems  Constitutional: Positive for chills, fatigue and fever.  HENT: Negative for congestion and sore throat.   Eyes: Negative.   Respiratory: Positive for cough and shortness of breath. Negative for chest tightness.   Cardiovascular: Negative for chest pain.  Gastrointestinal: Negative for abdominal pain, nausea and vomiting.  Endocrine: Positive for polyuria. Negative for polydipsia.  Genitourinary: Negative.   Musculoskeletal: Negative for arthralgias, joint swelling and neck pain.  Skin: Negative.  Negative for rash and wound.  Neurological: Positive for weakness. Negative for dizziness, light-headedness, numbness and headaches.  Psychiatric/Behavioral: Negative.      Physical Exam Updated Vital Signs BP (!) 110/56   Pulse (!) 102   Temp 98.3 F (36.8 C) (Oral)   Resp 18   Ht 5\' 4"  (1.626 m)   Wt 89.8 kg (198 lb)   SpO2 100%   BMI 33.99 kg/m   Physical Exam   ED Treatments / Results  Labs (all labs ordered are listed, but only abnormal results are displayed) Labs Reviewed  CBC WITH DIFFERENTIAL/PLATELET - Abnormal; Notable for the following:       Result Value   WBC 16.9 (*)    Neutro Abs 11.5 (*)    Monocytes Absolute 1.1 (*)    All  other components within normal limits  BASIC METABOLIC PANEL - Abnormal; Notable for the following:    Sodium 129 (*)    Chloride 98 (*)    CO2 19 (*)    Glucose, Bld 512 (*)    BUN 41 (*)    Creatinine, Ser 1.85 (*)    GFR calc non Af Amer 28 (*)    GFR calc Af Amer 33 (*)    All other components within normal limits  URINALYSIS, ROUTINE W REFLEX MICROSCOPIC - Abnormal; Notable for the following:    APPearance HAZY (*)    Glucose, UA >=500 (*)    Leukocytes, UA SMALL (*)    Squamous Epithelial / LPF 0-5 (*)    All other components within normal limits  CBG MONITORING, ED - Abnormal; Notable for the following:    Glucose-Capillary 470 (*)  All other components within normal limits  CBG MONITORING, ED - Abnormal; Notable for the following:    Glucose-Capillary 356 (*)    All other components within normal limits  I-STAT CHEM 8, ED - Abnormal; Notable for the following:    Sodium 133 (*)    BUN 37 (*)    Creatinine, Ser 1.60 (*)    Glucose, Bld 221 (*)    All other components within normal limits  BRAIN NATRIURETIC PEPTIDE    EKG  EKG Interpretation None       Radiology Dg Chest 2 View  Result Date: 11/23/2016 CLINICAL DATA:  Nonproductive cough and shortness of breath EXAM: CHEST  2 VIEW COMPARISON:  06/05/2013 FINDINGS: Hyperinflation and interstitial coarsening. Blunting of the lateral right costophrenic sulcus with apparent elevated diaphragm, attributed to scarring based on previous study. There is no edema, consolidation, effusion, or pneumothorax. Normal heart size and mediastinal contours. Thoracic spondylosis. IMPRESSION: 1. No acute finding. 2. Probable COPD. 3. Pleural based scarring on the right. Electronically Signed   By: Marnee Spring M.D.   On: 11/23/2016 10:31    Procedures Procedures (including critical care time)  Medications Ordered in ED Medications  fluconazole (DIFLUCAN) tablet 150 mg (not administered)  promethazine-codeine (PHENERGAN with  CODEINE) 6.25-10 MG/5ML syrup 5 mL (5 mLs Oral Given 11/23/16 1143)  ipratropium-albuterol (DUONEB) 0.5-2.5 (3) MG/3ML nebulizer solution 3 mL (3 mLs Nebulization Given 11/23/16 1123)  albuterol (PROVENTIL) (2.5 MG/3ML) 0.083% nebulizer solution 2.5 mg (2.5 mg Nebulization Given 11/23/16 1123)  sodium chloride 0.9 % bolus 1,000 mL (0 mLs Intravenous Stopped 11/23/16 1500)  insulin aspart (novoLOG) injection 10 Units (10 Units Intravenous Given 11/23/16 1422)  promethazine-codeine (PHENERGAN with CODEINE) 6.25-10 MG/5ML syrup 5 mL (5 mLs Oral Given 11/23/16 1528)     Initial Impression / Assessment and Plan / ED Course  I have reviewed the triage vital signs and the nursing notes.  Pertinent labs & imaging results that were available during my care of the patient were reviewed by me and considered in my medical decision making (see chart for details).    Albuterol neb given with mod improvement in wheezing, still with persistent cough.  IV fluids, insulin aspart given, pt still with hyperglycemia without ketosis. Renal insufficiency with dehydration.   Discussed with Dr. Jodi Mourning who also saw pt. We feel this pt would benefit from admission for better glucose control, IV fluids.   Discussed with Dr. Antionette Char who will see pt in ed.  Final Clinical Impressions(s) / ED Diagnoses   Final diagnoses:  Acute hyperglycemia  Renal insufficiency  Mild intermittent asthma with acute exacerbation    New Prescriptions New Prescriptions   No medications on file     Victoriano Lain 11/23/16 1709    Blane Ohara, MD 11/25/16 [phone]

## 2016-11-23 NOTE — H&P (Signed)
History and Physical    Kelli Summers KGM:[id number] DOB: 11-05-1955 DOA: 11/23/2016  PCP: Assunta Found, MD   Patient coming from: Home  Chief Complaint: Cough, SOB, aches, wheezing  HPI: Kelli Summers is a 61 y.o. female with medical history significant for hypertension, insulin-dependent diabetes mellitus, chronic musculoskeletal pain, chronic kidney disease stage II-III, and 20 pack-year smoking history, now presenting to the emergency department for evaluation of cough, dyspnea, wheezing, and generalized aches. Patient reports that she been in her usual state of health until approximately 2 weeks ago when she developed sinus congestion, conjunctivitis, and rhinorrhea. She completed a course of antibiotics with resolution and those symptoms, but around the same time, she began to develop dyspnea with cough and wheezing. Cough has mainly been nonproductive and is not associated with any chest pain or palpitations. Dyspnea is worse with exertion and she has been wheezing which is new. She saw her PCP for this and was started on doxycycline and antitussive, but has not made any improvement with this; in fact, her shortness of breath and wheezing has worsened. She denies recent long distance travel, prolonged immobilization, or lower extremity swelling or tenderness.  ED Course: Upon arrival to the ED, patient is found to be afebrile, saturating adequately on room air, tachypneic, mildly tachycardic, and with stable blood pressure. Chest x-ray is negative for acute cardiopulmonary disease, but with changes suggestive of COPD. Chemistry panels notable for a sodium of 129, bicarbonate 19, BUN 41, glucose 512, and serum creatinine 1.85, up from 1.27 last October. CBC is notable for a leukocytosis to 16,900 and is otherwise unremarkable. BNP is within normal limits. Urinalysis features greater and 500 glucose, negative ketones. Patient was treated with 10 units of IV NovoLog, DuoNeb, and 1 L of normal  saline in the emergency department. She reported some mild subjective improvement after the breathing treatment and the wheezing subsided, but she continues to be dyspneic at rest and will be admitted to the telemetry unit for ongoing evaluation and management of suspected exacerbation and COPD with uncontrolled blood sugar.   Review of Systems:  All other systems reviewed and apart from HPI, are negative.  Past Medical History:  Diagnosis Date  . Arthritis   . Chronic back pain   . Chronic knee pain   . Depression   . Diabetes mellitus   . Ejection fraction   . History of stress test 01/2010   normal myoview stress test  . Hypertension   . Sinus tachycardia     Past Surgical History:  Procedure Laterality Date  . ABDOMINAL HYSTERECTOMY    . BACK SURGERY    . CHOLECYSTECTOMY    . COLONOSCOPY N/A 04/15/2015   Procedure: COLONOSCOPY;  Surgeon: Franky Macho, MD;  Location: [facility];  Service: Gastroenterology;  Laterality: N/A;  . CYST EXCISION Left 04/02/2016   Procedure: EXCISION 1 CM CYST ON LEFT SIDE OF FACE;  Surgeon: Franky Macho, MD;  Location: [facility] ORS;  Service: General;  Laterality: Left;  Marland Kitchen VIDEO ASSISTED THORACOSCOPY (VATS)/DECORTICATION Right 05/09/2013   Procedure: VIDEO ASSISTED THORACOSCOPY (VATS)/DECORTICATION;  Surgeon: Loreli Slot, MD;  Location: [facility] OR;  Service: Thoracic;  Laterality: Right;  . WEDGE RESECTION Right 05/09/2013   Procedure: WEDGE RESECTION RIGHT LOWER LOBE;  Surgeon: Loreli Slot, MD;  Location: [facility] OR;  Service: Thoracic;  Laterality: Right;     reports that she quit smoking about 4 years ago. Her smoking use included Cigarettes. She has a 20.00 pack-year smoking  history. She has quit using smokeless tobacco. She reports that she does not drink alcohol or use drugs.  Allergies  Allergen Reactions  . Ciprofloxacin Itching  . Hydrocodone-Acetaminophen Itching  . Penicillins Itching    Tolerates Ceftin Has patient had a PCN  reaction causing immediate rash, facial/tongue/throat swelling, SOB or lightheadedness with hypotension: Yes Has patient had a PCN reaction causing severe rash involving mucus membranes or skin necrosis: No Has patient had a PCN reaction that required hospitalization No Has patient had a PCN reaction occurring within the last 10 years: No If all of the above answers are "NO", then may proceed with Cephalosporin use.   Marland Kitchen Percocet [Oxycodone-Acetaminophen] Itching    Family History  Problem Relation Age of Onset  . Emphysema Maternal Grandfather        smoked  . Breast cancer Maternal Aunt      Prior to Admission medications   Medication Sig Start Date End Date Taking? Authorizing Provider  doxycycline (VIBRA-TABS) 100 MG tablet Take 100 mg by mouth 2 (two) times daily.   Yes [provider]  DULoxetine (CYMBALTA) 60 MG capsule Take 60 mg by mouth daily.   Yes [provider]  FARXIGA 10 MG TABS tablet Take 1 tablet by mouth daily. 11/12/16  Yes [provider]  losartan (COZAAR) 100 MG tablet Take 100 mg by mouth daily.  03/26/15  Yes [provider]  meloxicam (MOBIC) 15 MG tablet Take 1 tablet by mouth daily. 03/24/16  Yes [provider]  metoprolol succinate (TOPROL-XL) 50 MG 24 hr tablet Take 1 tablet by mouth daily. 11/09/16  Yes [provider]  SOLIQUA 100-33 UNT-MCG/ML SOPN Inject 24 Units into the skin at bedtime. 11/16/16  Yes [provider]  glipiZIDE (GLUCOTROL) 10 MG tablet Take 1 tablet by mouth daily. 03/05/16   [provider]  GLYXAMBI 25-5 MG TABS Take 1 tablet by mouth daily. 03/06/15   [provider]  TOUJEO SOLOSTAR 300 UNIT/ML SOPN INJECT 34 UNITS PER INJECTOR AT BEDTIME SUBCUTANEOUSLY 04/03/15   [provider]  traMADol (ULTRAM) 50 MG tablet Take 1 tablet (50 mg total) by mouth every 6 (six) hours as needed. 04/02/16   Franky Macho, MD    Physical Exam: Vitals:   11/23/16 1128  11/23/16 1405 11/23/16 1600 11/23/16 1730  BP:  (!) 105/56 (!) 110/56 107/70  Pulse:  (!) 106 (!) 102 (!) 111  Resp:  18 18 18   Temp:      TempSrc:      SpO2: 95% 100% 100% 100%  Weight:      Height:          Constitutional: No acute distress. Tachypneic, coughing. No pallor or cyanosis.  Eyes: PERTLA, lids and conjunctivae normal ENMT: Mucous membranes are moist. Posterior pharynx clear of any exudate or lesions.   Neck: normal, supple, no masses, no thyromegaly Respiratory: clear to auscultation bilaterally, no wheezing, no crackles. Increased WOB, tachypnea, coughing.  Cardiovascular: Rate ~110 and regular. No extremity edema. No significant JVD. Abdomen: No distension, no tenderness, no masses palpated. Bowel sounds normal.  Musculoskeletal: no clubbing / cyanosis. No joint deformity upper and lower extremities.    Skin: no significant rashes, lesions, ulcers. Warm, dry, well-perfused. Neurologic: CN 2-12 grossly intact. Sensation intact, DTR normal. Strength 5/5 in all 4 limbs.  Psychiatric: Alert and oriented x 3. Calm and cooperative.     Labs on Admission: I have personally reviewed following labs and imaging studies  CBC:  Recent Labs Lab 11/23/16 1143 11/23/16 1624  WBC 16.9*  --   NEUTROABS 11.5*  --   HGB 13.9 13.9  HCT 40.6 41.0  MCV 90.2  --   PLT 251  --    Basic Metabolic Panel:  Recent Labs Lab 11/23/16 1143 11/23/16 1624  NA 129* 133*  K 3.7 3.7  CL 98* 104  CO2 19*  --   GLUCOSE 512* 221*  BUN 41* 37*  CREATININE 1.85* 1.60*  CALCIUM 9.2  --    GFR: Estimated Creatinine Clearance: 40 mL/min (A) (by C-G formula based on SCr of 1.6 mg/dL (H)). Liver Function Tests: No results for input(s): AST, ALT, ALKPHOS, BILITOT, PROT, ALBUMIN in the last 168 hours. No results for input(s): LIPASE, AMYLASE in the last 168 hours. No results for input(s): AMMONIA in the last 168 hours. Coagulation Profile: No results for input(s): INR, PROTIME in the  last 168 hours. Cardiac Enzymes: No results for input(s): CKTOTAL, CKMB, CKMBINDEX, TROPONINI in the last 168 hours. BNP (last 3 results) No results for input(s): PROBNP in the last 8760 hours. HbA1C: No results for input(s): HGBA1C in the last 72 hours. CBG:  Recent Labs Lab 11/23/16 1355 11/23/16 1456  GLUCAP 470* 356*   Lipid Profile: No results for input(s): CHOL, HDL, LDLCALC, TRIG, CHOLHDL, LDLDIRECT in the last 72 hours. Thyroid Function Tests: No results for input(s): TSH, T4TOTAL, FREET4, T3FREE, THYROIDAB in the last 72 hours. Anemia Panel: No results for input(s): VITAMINB12, FOLATE, FERRITIN, TIBC, IRON, RETICCTPCT in the last 72 hours. Urine analysis:    Component Value Date/Time   COLORURINE YELLOW 11/23/2016 1424   APPEARANCEUR HAZY (A) 11/23/2016 1424   LABSPEC 1.024 11/23/2016 1424   PHURINE 5.0 11/23/2016 1424   GLUCOSEU >=500 (A) 11/23/2016 1424   HGBUR NEGATIVE 11/23/2016 1424   BILIRUBINUR NEGATIVE 11/23/2016 1424   KETONESUR NEGATIVE 11/23/2016 1424   PROTEINUR NEGATIVE 11/23/2016 1424   UROBILINOGEN 0.2 05/16/2013 1840   NITRITE NEGATIVE 11/23/2016 1424   LEUKOCYTESUR SMALL (A) 11/23/2016 1424   Sepsis Labs: @LABRCNTIP (procalcitonin:4,lacticidven:4) )No results found for this or any previous visit (from the past 240 hour(s)).   Radiological Exams on Admission: Dg Chest 2 View  Result Date: 11/23/2016 CLINICAL DATA:  Nonproductive cough and shortness of breath EXAM: CHEST  2 VIEW COMPARISON:  06/05/2013 FINDINGS: Hyperinflation and interstitial coarsening. Blunting of the lateral right costophrenic sulcus with apparent elevated diaphragm, attributed to scarring based on previous study. There is no edema, consolidation, effusion, or pneumothorax. Normal heart size and mediastinal contours. Thoracic spondylosis. IMPRESSION: 1. No acute finding. 2. Probable COPD. 3. Pleural based scarring on the right. Electronically Signed   By: Marnee Spring M.D.    On: 11/23/2016 10:31    EKG: Not performed.   Assessment/Plan  1. COPD with acute exacerbation  - Pt presents with a week of progressive cough, SOB, and wheezing  - She is a former smoker and CXR with hyperinflation, flattened diaphragms, slender mediastinal silhouette suggestive of COPD   - Current sxs preceded by sinus congestion and rhinorrhea  - Suspect acute exacerbation in COPD precipitated by viral URI with post-nasal drip  - She was treated with nebs in ED and made some improvement, but continuous to be dyspneic at rest  - Plan to check sputum culture and respiratory virus panel, continue nebs, start azithromycin, hold-off on steroid for now as wheezing resolved with nebs and sugar is uncontrolled  - Continue supportive care with supplemental O2 and cough syrup  2. Insulin-dependent DM with uncontrolled blood sugar  - A1c was 14.9% in 2014; pt reports it was recently 7.2% with PCP  - Serum glucose 512 on presentation, improved to low-200's after 10 units IV Novolog and 1 L NS in ED  - Managed at home with Marcelline DeistFarxiga and Soliqua 24 units qHS   - Check CBG with meals and qHS - Start Lantus 24 units qHS with moderate-intensity SSI and 3 units Novolog with meals   3. Acute kidney injury superimposed on CKD stage II-III - SCr is 1.85 on admission, up from 1.27 in October 2017  - Likely a prerenal azotemia in setting of acute illness and hyperglycemia with osmotic diuresis  - She was given 1 liter NS in ED and will be continued on NS infusion  - Hold losartan and NSAID - Repeat chem panel in am    4. Hyponatremia  - Serum sodium was 129 on presentation in setting of hypovolemia and serum glucose of 512  - She was given a liter of NS in ED and is continued on NS infusion; sugars will be managed as above  - Repeat chem panel in am   5. Hypertension  - BP is at goal  - Continue Toprol as tolerated, hold losartan until renal fxn stabilizes   6. Depression - Appears to be stable    - Continue Cymbalta    DVT prophylaxis: sq heparin Code Status: Full  Family Communication: Aunt updated at bedside with patient's permission Disposition Plan: Admit to telemetry Consults called: None Admission status: Inpatient    Briscoe Deutscherimothy S Javian Nudd, MD [facility] Pager [phone]  If 7PM-7AM, please contact night-coverage [email or web address] Password [email or web address]  11/23/2016, 5:50 PM

## 2016-11-23 NOTE — ED Notes (Signed)
CRITICAL VALUE ALERT  Critical Value:  Glucose 512  Date & Time Notied:  1253  Provider Notified: Burgess AmorJulie Idol PA  Orders Received/Actions taken: 1253

## 2016-11-24 LAB — RESPIRATORY PANEL BY PCR

## 2016-11-24 LAB — INFLUENZA PANEL BY PCR (TYPE A & B)
Influenza A By PCR: NEGATIVE
Influenza B By PCR: NEGATIVE

## 2016-11-24 LAB — CBC WITH DIFFERENTIAL/PLATELET
Basophils Absolute: 0.1 10*3/uL (ref 0.0–0.1)
Basophils Relative: 0 %
Eosinophils Absolute: 0.4 10*3/uL (ref 0.0–0.7)
Eosinophils Relative: 4 %
HCT: 36.9 % (ref 36.0–46.0)
Hemoglobin: 12.4 g/dL (ref 12.0–15.0)
Lymphocytes Relative: 25 %
Lymphs Abs: 2.8 10*3/uL (ref 0.7–4.0)
MCH: 30.5 pg (ref 26.0–34.0)
MCHC: 33.6 g/dL (ref 30.0–36.0)
MCV: 90.7 fL (ref 78.0–100.0)
Monocytes Absolute: 1.1 10*3/uL — ABNORMAL HIGH (ref 0.1–1.0)
Monocytes Relative: 9 %
Neutro Abs: 7 10*3/uL (ref 1.7–7.7)
Neutrophils Relative %: 62 %
Platelets: 203 10*3/uL (ref 150–400)
RBC: 4.07 MIL/uL (ref 3.87–5.11)
RDW: 13.7 % (ref 11.5–15.5)
WBC: 11.3 10*3/uL — ABNORMAL HIGH (ref 4.0–10.5)

## 2016-11-24 LAB — HEMOGLOBIN A1C
Hgb A1c MFr Bld: 11.3 % — ABNORMAL HIGH (ref 4.8–5.6)
Mean Plasma Glucose: 278 mg/dL

## 2016-11-24 LAB — BASIC METABOLIC PANEL
Anion gap: 9 (ref 5–15)
BUN: 35 mg/dL — ABNORMAL HIGH (ref 6–20)
CO2: 19 mmol/L — ABNORMAL LOW (ref 22–32)
Calcium: 8.7 mg/dL — ABNORMAL LOW (ref 8.9–10.3)
Chloride: 107 mmol/L (ref 101–111)
Creatinine, Ser: 1.49 mg/dL — ABNORMAL HIGH (ref 0.44–1.00)
GFR calc Af Amer: 43 mL/min — ABNORMAL LOW (ref >60)
GFR calc non Af Amer: 37 mL/min — ABNORMAL LOW (ref >60)
Glucose, Bld: 220 mg/dL — ABNORMAL HIGH (ref 65–99)
Potassium: 4.2 mmol/L (ref 3.5–5.1)
Sodium: 135 mmol/L (ref 135–145)

## 2016-11-24 LAB — GLUCOSE, CAPILLARY
Glucose-Capillary: 233 mg/dL — ABNORMAL HIGH (ref 65–99)
Glucose-Capillary: 273 mg/dL — ABNORMAL HIGH (ref 65–99)

## 2016-11-24 MED ORDER — AZITHROMYCIN 500 MG PO TABS: 500.0000 mg | ORAL_TABLET | Freq: Every day | ORAL | 0 refills | Status: AC

## 2016-11-24 MED ORDER — DM-GUAIFENESIN ER 30-600 MG PO TB12: 1.0000 | ORAL_TABLET | Freq: Two times a day (BID) | ORAL | 0 refills | Status: DC

## 2016-11-24 MED ORDER — LORATADINE 10 MG PO TABS: 10.0000 mg | ORAL_TABLET | Freq: Every day | ORAL | 2 refills | Status: DC

## 2016-11-24 MED ORDER — FLUTICASONE PROPIONATE HFA 110 MCG/ACT IN AERO: 2.0000 | INHALATION_SPRAY | Freq: Two times a day (BID) | RESPIRATORY_TRACT | 0 refills | Status: DC

## 2016-11-24 MED ORDER — ALBUTEROL SULFATE HFA 108 (90 BASE) MCG/ACT IN AERS: 2.0000 | INHALATION_SPRAY | Freq: Four times a day (QID) | RESPIRATORY_TRACT | 2 refills | Status: DC | PRN

## 2016-11-24 MED ORDER — PROMETHAZINE-CODEINE 6.25-10 MG/5ML PO SYRP: 5.0000 mL | ORAL_SOLUTION | Freq: Four times a day (QID) | ORAL | 0 refills | Status: DC | PRN

## 2016-11-24 NOTE — Progress Notes (Signed)
Patient IV removed, tolerated well. Discharge instructions given at bedside.  

## 2016-11-24 NOTE — Discharge Summary (Signed)
Physician Discharge Summary  Kelli Summers ZOX:[id number] DOB: 20-May-1956 DOA: 11/23/2016  PCP: Assunta Found, MD  Admit date: 11/23/2016 Discharge date: 11/24/2016  Admitted From: home Disposition:  home  Recommendations for Outpatient Follow-up:  1. Follow up with PCP in 1-2 weeks 2. Please obtain BMP/CBC in one week   Home Health: Equipment/Devices:  Discharge Condition: stable CODE STATUS: full code Diet recommendation: Heart Healthy / Carb Modified   Brief/Interim Summary: 61 y/o Female with a history of chronic kidney disease stage 2-3, insulin-dependent diabetes, hypertension, 20-pack-year smoking history, presents to the hospital with cough shortness of breath, myalgias and wheezing. She was treated with one dose of intravenous steroids, antibiotics and bronchodilators. S influenza PCR returned negative. ince her admission, her respiratory status has improved. Wheezing has resolved. Coughing has improved with antitussives and she is able to ambulate on room air maintaining oxygen saturations greater than 95%. Patient was treated supportively with bronchodilators, antibiotics, inhaled steroids. We'll also continue pulmonary hygiene with Mucinex DM, antihistamines. She was noted to be dehydrated and had acute kidney injury. Losartan and NSAIDs were held on admission. Creatinine has improved with IV hydration. Continue to hold losartan and NSAIDs until follow-up with primary care physician. She's been advised to continue hydration after returning home. The remainder of medical issues remained stable.  Discharge Diagnoses:  Principal Problem:   COPD with acute exacerbation (HCC) Active Problems:   Insulin-requiring or dependent type II diabetes mellitus (HCC)   Hypertension   CKD (chronic kidney disease), stage II   AKI (acute kidney injury) (HCC)   Hyponatremia   Uncontrolled blood glucose   Depression    Discharge Instructions  Discharge Instructions    Diet - low sodium  heart healthy    Complete by:  As directed    Increase activity slowly    Complete by:  As directed      Allergies as of 11/24/2016      Reactions   Ciprofloxacin Itching   Hydrocodone-acetaminophen Itching   Pt can take acetaminophen   Penicillins Itching   Tolerates Ceftin Has patient had a PCN reaction causing immediate rash, facial/tongue/throat swelling, SOB or lightheadedness with hypotension: Yes Has patient had a PCN reaction causing severe rash involving mucus membranes or skin necrosis: No Has patient had a PCN reaction that required hospitalization No Has patient had a PCN reaction occurring within the last 10 years: No If all of the above answers are "NO", then may proceed with Cephalosporin use.   Percocet [oxycodone-acetaminophen] Itching      Medication List    STOP taking these medications   doxycycline 100 MG tablet Commonly known as:  VIBRA-TABS   losartan 100 MG tablet Commonly known as:  COZAAR   meloxicam 15 MG tablet Commonly known as:  MOBIC     TAKE these medications   albuterol 108 (90 Base) MCG/ACT inhaler Commonly known as:  PROVENTIL HFA;VENTOLIN HFA Inhale 2 puffs into the lungs every 6 (six) hours as needed for wheezing or shortness of breath.   azithromycin 500 MG tablet Commonly known as:  ZITHROMAX Take 1 tablet (500 mg total) by mouth daily. Take 1 tablet daily for 3 days.   dextromethorphan-guaiFENesin 30-600 MG 12hr tablet Commonly known as:  MUCINEX DM Take 1 tablet by mouth 2 (two) times daily.   DULoxetine 60 MG capsule Commonly known as:  CYMBALTA Take 60 mg by mouth daily.   FARXIGA 10 MG Tabs tablet Generic drug:  dapagliflozin propanediol Take 1 tablet by mouth  daily.   fluticasone 110 MCG/ACT inhaler Commonly known as:  FLOVENT HFA Inhale 2 puffs into the lungs 2 (two) times daily.   glipiZIDE 10 MG tablet Commonly known as:  GLUCOTROL Take 1 tablet by mouth daily.   GLYXAMBI 25-5 MG Tabs Generic drug:   Empagliflozin-Linagliptin Take 1 tablet by mouth daily.   loratadine 10 MG tablet Commonly known as:  CLARITIN Take 1 tablet (10 mg total) by mouth daily.   metoprolol succinate 50 MG 24 hr tablet Commonly known as:  TOPROL-XL Take 1 tablet by mouth daily.   promethazine-codeine 6.25-10 MG/5ML syrup Commonly known as:  PHENERGAN with CODEINE Take 5 mLs by mouth every 6 (six) hours as needed for cough.   SOLIQUA 100-33 UNT-MCG/ML Sopn Generic drug:  Insulin Glargine-Lixisenatide Inject 24 Units into the skin at bedtime.   TOUJEO SOLOSTAR 300 UNIT/ML Sopn Generic drug:  Insulin Glargine INJECT 34 UNITS PER INJECTOR AT BEDTIME SUBCUTANEOUSLY   traMADol 50 MG tablet Commonly known as:  ULTRAM Take 1 tablet (50 mg total) by mouth every 6 (six) hours as needed.       Allergies  Allergen Reactions  . Ciprofloxacin Itching  . Hydrocodone-Acetaminophen Itching    Pt can take acetaminophen  . Penicillins Itching    Tolerates Ceftin Has patient had a PCN reaction causing immediate rash, facial/tongue/throat swelling, SOB or lightheadedness with hypotension: Yes Has patient had a PCN reaction causing severe rash involving mucus membranes or skin necrosis: No Has patient had a PCN reaction that required hospitalization No Has patient had a PCN reaction occurring within the last 10 years: No If all of the above answers are "NO", then may proceed with Cephalosporin use.   Marland Kitchen. Percocet [Oxycodone-Acetaminophen] Itching    Consultations:     Procedures/Studies: Dg Chest 2 View  Result Date: 11/23/2016 CLINICAL DATA:  Nonproductive cough and shortness of breath EXAM: CHEST  2 VIEW COMPARISON:  06/05/2013 FINDINGS: Hyperinflation and interstitial coarsening. Blunting of the lateral right costophrenic sulcus with apparent elevated diaphragm, attributed to scarring based on previous study. There is no edema, consolidation, effusion, or pneumothorax. Normal heart size and mediastinal  contours. Thoracic spondylosis. IMPRESSION: 1. No acute finding. 2. Probable COPD. 3. Pleural based scarring on the right. Electronically Signed   By: Marnee SpringJonathon  Watts M.D.   On: 11/23/2016 10:31       Subjective: Shortness of breath is better, coughing better  Discharge Exam: Vitals:   11/24/16 0522 11/24/16 1430  BP: (!) 118/59 123/64  Pulse: (!) 107 (!) 105  Resp: 18 16  Temp: 98.6 F (37 C) 98.2 F (36.8 C)   Vitals:   11/23/16 1832 11/23/16 2117 11/24/16 0522 11/24/16 1430  BP: (!) 110/59 117/61 (!) 118/59 123/64  Pulse: (!) 111 (!) 117 (!) 107 (!) 105  Resp: 18 18 18 16   Temp: 97.8 F (36.6 C) 97.9 F (36.6 C) 98.6 F (37 C) 98.2 F (36.8 C)  TempSrc: Oral Oral Oral Oral  SpO2: 98% 100% 96% 98%  Weight: 90.4 kg (199 lb 6 oz)     Height: 5\' 4"  (1.626 m)       General: Pt is alert, awake, not in acute distress Cardiovascular: RRR, S1/S2 +, no rubs, no gallops Respiratory: CTA bilaterally, no wheezing, no rhonchi Abdominal: Soft, NT, ND, bowel sounds + Extremities: no edema, no cyanosis    The results of significant diagnostics from this hospitalization (including imaging, microbiology, ancillary and laboratory) are listed below for reference.  Microbiology: No results found for this or any previous visit (from the past 240 hour(s)).   Labs: BNP (last 3 results)  Recent Labs  11/23/16 1143  BNP 16.0   Basic Metabolic Panel:  Recent Labs Lab 11/23/16 1143 11/23/16 1624 11/24/16 0536  NA 129* 133* 135  K 3.7 3.7 4.2  CL 98* 104 107  CO2 19*  --  19*  GLUCOSE 512* 221* 220*  BUN 41* 37* 35*  CREATININE 1.85* 1.60* 1.49*  CALCIUM 9.2  --  8.7*   Liver Function Tests: No results for input(s): AST, ALT, ALKPHOS, BILITOT, PROT, ALBUMIN in the last 168 hours. No results for input(s): LIPASE, AMYLASE in the last 168 hours. No results for input(s): AMMONIA in the last 168 hours. CBC:  Recent Labs Lab 11/23/16 1143 11/23/16 1624  11/24/16 0536  WBC 16.9*  --  11.3*  NEUTROABS 11.5*  --  7.0  HGB 13.9 13.9 12.4  HCT 40.6 41.0 36.9  MCV 90.2  --  90.7  PLT 251  --  203   Cardiac Enzymes: No results for input(s): CKTOTAL, CKMB, CKMBINDEX, TROPONINI in the last 168 hours. BNP: Invalid input(s): POCBNP CBG:  Recent Labs Lab 11/23/16 1355 11/23/16 1456 11/23/16 2117 11/24/16 0730 11/24/16 1128  GLUCAP 470* 356* 451* 233* 273*   D-Dimer No results for input(s): DDIMER in the last 72 hours. Hgb A1c  Recent Labs  11/23/16 1901  HGBA1C 11.3*   Lipid Profile No results for input(s): CHOL, HDL, LDLCALC, TRIG, CHOLHDL, LDLDIRECT in the last 72 hours. Thyroid function studies No results for input(s): TSH, T4TOTAL, T3FREE, THYROIDAB in the last 72 hours.  Invalid input(s): FREET3 Anemia work up No results for input(s): VITAMINB12, FOLATE, FERRITIN, TIBC, IRON, RETICCTPCT in the last 72 hours. Urinalysis    Component Value Date/Time   COLORURINE YELLOW 11/23/2016 1424   APPEARANCEUR HAZY (A) 11/23/2016 1424   LABSPEC 1.024 11/23/2016 1424   PHURINE 5.0 11/23/2016 1424   GLUCOSEU >=500 (A) 11/23/2016 1424   HGBUR NEGATIVE 11/23/2016 1424   BILIRUBINUR NEGATIVE 11/23/2016 1424   KETONESUR NEGATIVE 11/23/2016 1424   PROTEINUR NEGATIVE 11/23/2016 1424   UROBILINOGEN 0.2 05/16/2013 1840   NITRITE NEGATIVE 11/23/2016 1424   LEUKOCYTESUR SMALL (A) 11/23/2016 1424   Sepsis Labs Invalid input(s): PROCALCITONIN,  WBC,  LACTICIDVEN Microbiology No results found for this or any previous visit (from the past 240 hour(s)).   Time coordinating discharge: Over 30 minutes  SIGNED:   Erick Blinks, MD  [facility] 11/24/2016, 3:30 PM Pager   If 7PM-7AM, please contact night-coverage [email or web address] Password [email or web address]

## 2016-11-24 NOTE — Care Management Note (Signed)
Case Management Note  Patient Details  Name: Kelli BongVicky P Mckillop MRN: [id number] Date of Birth: 12/06/1955  Subjective/Objective:                  Pt admitted with COPD exacerbation. Pt from home, lives with her aunt and is ind with ADL's. She has PCP, transportation and insurance with drug coverage. She is not have neb or inhalers PTA. She plans to return home at DC.   Action/Plan: CM will follow, ?need for inhalers or neb machine.   Expected Discharge Date:  11/25/16               Expected Discharge Plan:  Home/Self Care  In-House Referral:  NA  Discharge planning Services  CM Consult  Post Acute Care Choice:  NA Choice offered to:  NA  Status of Service:  In process, will continue to follow   Malcolm MetroChildress, Slade Pierpoint Demske, RN 11/24/2016, 9:57 AM

## 2016-11-24 NOTE — Progress Notes (Addendum)
Inpatient Diabetes Program Recommendations  AACE/ADA: New Consensus Statement on Inpatient Glycemic Control (2015)  Target Ranges:  Prepandial:   less than 140 mg/dL      Peak postprandial:   less than 180 mg/dL (1-2 hours)      Critically ill patients:  140 - 180 mg/dL   Lab Results  Component Value Date   GLUCAP 273 (H) 11/24/2016   HGBA1C 11.3 (H) 11/23/2016    Results for Kelli Summers, Kelli Summers (MRN [id number]) as of 11/24/2016 14:01  Ref. Range 11/23/2016 14:56 11/23/2016 21:17 11/24/2016 07:30 11/24/2016 11:28  Glucose-Capillary Latest Ref Range: 65 - 99 mg/dL 409356 (H) 811451 (H) 914233 (H) 273 (H)    Diabetes history: type 2 Outpatient Diabetes medications: Farxiga 10 mg, glipizide 10 mg, glyxambi-25-5 mg, Soliqua 100-33 units, and toujeo 34 units at HS.  Current orders for Inpatient glycemic control: Moderate correction tidwx and lantus 24 units.  Inpatient Diabetes Program Recommendations:    Please consider while here increasing lantus to 34 units and add novolog meal coverage of 6 units tidwc  Thank you Lenor CoffinAnn Lindsea Olivar, RN, MSN, CDE  Diabetes Inpatient Program Office: [phone] Pager: [phone] 8:00 am to 5:00 pm

## 2016-11-25 LAB — URINE CULTURE

## 2016-11-25 LAB — HIV ANTIBODY (ROUTINE TESTING W REFLEX): HIV Screen 4th Generation wRfx: NONREACTIVE

## 2017-01-20 DIAGNOSIS — M940 Chondrocostal junction syndrome [Tietze]: Secondary | ICD-10-CM | POA: Diagnosis not present

## 2017-01-20 DIAGNOSIS — E1165 Type 2 diabetes mellitus with hyperglycemia: Secondary | ICD-10-CM | POA: Diagnosis not present

## 2017-04-14 DIAGNOSIS — E782 Mixed hyperlipidemia: Secondary | ICD-10-CM | POA: Diagnosis not present

## 2017-04-14 DIAGNOSIS — E1165 Type 2 diabetes mellitus with hyperglycemia: Secondary | ICD-10-CM | POA: Diagnosis not present

## 2017-06-01 DIAGNOSIS — H6993 Unspecified Eustachian tube disorder, bilateral: Secondary | ICD-10-CM | POA: Diagnosis not present

## 2017-07-19 DIAGNOSIS — R591 Generalized enlarged lymph nodes: Secondary | ICD-10-CM | POA: Diagnosis not present

## 2017-07-19 DIAGNOSIS — L0889 Other specified local infections of the skin and subcutaneous tissue: Secondary | ICD-10-CM | POA: Diagnosis not present

## 2017-07-20 ENCOUNTER — Encounter (HOSPITAL_COMMUNITY): Payer: Self-pay | Admitting: Emergency Medicine

## 2017-07-20 ENCOUNTER — Other Ambulatory Visit: Payer: Self-pay

## 2017-07-20 ENCOUNTER — Emergency Department (HOSPITAL_COMMUNITY)
Admission: EM | Admit: 2017-07-20 | Discharge: 2017-07-20 | Disposition: A | Discharge status: Home / Self Care | Payer: 59 | Attending: Emergency Medicine | Admitting: Emergency Medicine

## 2017-07-20 ENCOUNTER — Emergency Department (HOSPITAL_COMMUNITY): Payer: 59

## 2017-07-20 DIAGNOSIS — I129 Hypertensive chronic kidney disease with stage 1 through stage 4 chronic kidney disease, or unspecified chronic kidney disease: Secondary | ICD-10-CM | POA: Diagnosis not present

## 2017-07-20 DIAGNOSIS — Z7984 Long term (current) use of oral hypoglycemic drugs: Secondary | ICD-10-CM | POA: Insufficient documentation

## 2017-07-20 DIAGNOSIS — Z79899 Other long term (current) drug therapy: Secondary | ICD-10-CM | POA: Insufficient documentation

## 2017-07-20 DIAGNOSIS — R600 Localized edema: Secondary | ICD-10-CM | POA: Diagnosis present

## 2017-07-20 DIAGNOSIS — J449 Chronic obstructive pulmonary disease, unspecified: Secondary | ICD-10-CM | POA: Insufficient documentation

## 2017-07-20 DIAGNOSIS — E1122 Type 2 diabetes mellitus with diabetic chronic kidney disease: Secondary | ICD-10-CM | POA: Insufficient documentation

## 2017-07-20 DIAGNOSIS — Z87891 Personal history of nicotine dependence: Secondary | ICD-10-CM | POA: Insufficient documentation

## 2017-07-20 DIAGNOSIS — L03211 Cellulitis of face: Secondary | ICD-10-CM | POA: Insufficient documentation

## 2017-07-20 DIAGNOSIS — N182 Chronic kidney disease, stage 2 (mild): Secondary | ICD-10-CM | POA: Diagnosis not present

## 2017-07-20 LAB — CBC WITH DIFFERENTIAL/PLATELET
Basophils Absolute: 0 10*3/uL (ref 0.0–0.1)
Basophils Relative: 0 %
Eosinophils Absolute: 0.3 10*3/uL (ref 0.0–0.7)
Eosinophils Relative: 2 %
HCT: 42.8 % (ref 36.0–46.0)
Hemoglobin: 13.5 g/dL (ref 12.0–15.0)
Lymphocytes Relative: 16 %
Lymphs Abs: 2.2 10*3/uL (ref 0.7–4.0)
MCH: 29.1 pg (ref 26.0–34.0)
MCHC: 31.5 g/dL (ref 30.0–36.0)
MCV: 92.2 fL (ref 78.0–100.0)
Monocytes Absolute: 0.7 10*3/uL (ref 0.1–1.0)
Monocytes Relative: 5 %
Neutro Abs: 10 10*3/uL — ABNORMAL HIGH (ref 1.7–7.7)
Neutrophils Relative %: 77 %
Platelets: 260 10*3/uL (ref 150–400)
RBC: 4.64 MIL/uL (ref 3.87–5.11)
RDW: 13.8 % (ref 11.5–15.5)
WBC: 13.2 10*3/uL — ABNORMAL HIGH (ref 4.0–10.5)

## 2017-07-20 LAB — BASIC METABOLIC PANEL
Anion gap: 12 (ref 5–15)
BUN: 30 mg/dL — ABNORMAL HIGH (ref 6–20)
CO2: 23 mmol/L (ref 22–32)
Calcium: 9.3 mg/dL (ref 8.9–10.3)
Chloride: 100 mmol/L — ABNORMAL LOW (ref 101–111)
Creatinine, Ser: 1.76 mg/dL — ABNORMAL HIGH (ref 0.44–1.00)
GFR calc Af Amer: 35 mL/min — ABNORMAL LOW (ref >60)
GFR calc non Af Amer: 30 mL/min — ABNORMAL LOW (ref >60)
Glucose, Bld: 415 mg/dL — ABNORMAL HIGH (ref 65–99)
Potassium: 4.6 mmol/L (ref 3.5–5.1)
Sodium: 135 mmol/L (ref 135–145)

## 2017-07-20 MED ORDER — VANCOMYCIN HCL IN DEXTROSE 1-5 GM/200ML-% IV SOLN
1000.0000 mg | Freq: Once | INTRAVENOUS | Status: AC
  Administered 2017-07-20: 1000 mg via INTRAVENOUS
  Filled 2017-07-20: qty 200

## 2017-07-20 MED ORDER — IOPAMIDOL (ISOVUE-300) INJECTION 61%
60.0000 mL | Freq: Once | INTRAVENOUS | Status: AC | PRN
  Administered 2017-07-20: 60 mL via INTRAVENOUS

## 2017-07-20 MED ORDER — SODIUM CHLORIDE 0.9 % IV BOLUS (SEPSIS)
1000.0000 mL | Freq: Once | INTRAVENOUS | Status: AC
  Administered 2017-07-20: 1000 mL via INTRAVENOUS

## 2017-07-20 NOTE — Progress Notes (Signed)
Patient removed 4n "diamond" studs and 1 silver chain with silver heart, placed in plastic bag and gave back to patient.

## 2017-07-20 NOTE — ED Provider Notes (Signed)
[facility] EMERGENCY DEPARTMENT Provider Note   CSN: [id number] Arrival date & time: 07/20/17  1327     History   Chief Complaint Chief Complaint  Patient presents with  . Abscess    HPI Kelli Summers is a 62 y.o. female.  HPI  Patient is a 62 year old female, she has a known history of diabetes and is on both oral and insulin, she has noted that approximately 9 days ago she started with a small pimple sized lesion on the left upper cheek, she tried to match this and initially there was no issues with that however over the last week she developed increasing amounts of redness and swelling which culminated with an office visit yesterday because it started to become hardened and drained pus.  She was given some shot of an antibiotic in the office and when she followed up today because there was no significant improvement and it continued to be similar appearance to yesterday she was referred to the emergency department.  She had been prescribed a sulfa-based medication presumably Bactrim which she has been taking.  She reports that there is a stinging quality to the redness when it is touched, she denies fevers but does have mild neck pain with this.  Her temperature yesterday was 100.2 but no fever today.  She denies coughing shortness of breath or chest discomfort.  She did note that her blood sugar was over 280 this morning when she awoke which is higher than usual.  Past Medical History:  Diagnosis Date  . Arthritis   . Chronic back pain   . Chronic knee pain   . Depression   . Diabetes mellitus   . Ejection fraction   . History of stress test 01/2010   normal myoview stress test  . Hypertension   . Sinus tachycardia     Patient Active Problem List   Diagnosis Date Noted  . CKD (chronic kidney disease), stage II 11/23/2016  . AKI (acute kidney injury) (HCC) 11/23/2016  . Hyponatremia 11/23/2016  . Uncontrolled blood glucose 11/23/2016  . Depression 11/23/2016  . COPD  with acute exacerbation (HCC) 11/13/2013  . Smoking 11/13/2013  . Hypertension 10/15/2013  . Ejection fraction   . Sinus tachycardia   . Anemia, blood loss 05/10/2013  . Acute hyperglycemia 04/30/2013  . SHOULDER PAIN 03/02/2007  . Insulin-requiring or dependent type II diabetes mellitus (HCC) 02/14/2007    Past Surgical History:  Procedure Laterality Date  . ABDOMINAL HYSTERECTOMY    . BACK SURGERY    . CHOLECYSTECTOMY    . COLONOSCOPY N/A 04/15/2015   Procedure: COLONOSCOPY;  Surgeon: Franky Macho, MD;  Location: [facility] SUITE;  Service: Gastroenterology;  Laterality: N/A;  . CYST EXCISION Left 04/02/2016   Procedure: EXCISION 1 CM CYST ON LEFT SIDE OF FACE;  Surgeon: Franky Macho, MD;  Location: [facility] ORS;  Service: General;  Laterality: Left;  Marland Kitchen VIDEO ASSISTED THORACOSCOPY (VATS)/DECORTICATION Right 05/09/2013   Procedure: VIDEO ASSISTED THORACOSCOPY (VATS)/DECORTICATION;  Surgeon: Loreli Slot, MD;  Location: [facility] OR;  Service: Thoracic;  Laterality: Right;  . WEDGE RESECTION Right 05/09/2013   Procedure: WEDGE RESECTION RIGHT LOWER LOBE;  Surgeon: Loreli Slot, MD;  Location: [facility] OR;  Service: Thoracic;  Laterality: Right;    OB History    No data available       Home Medications    Prior to Admission medications   Medication Sig Start Date End Date Taking? Authorizing Provider  albuterol (PROVENTIL HFA;VENTOLIN HFA) 108 (  90 Base) MCG/ACT inhaler Inhale 2 puffs into the lungs every 6 (six) hours as needed for wheezing or shortness of breath. 11/24/16  Yes Erick Blinks, MD  DULoxetine (CYMBALTA) 60 MG capsule Take 60 mg by mouth daily.   Yes [provider]  empagliflozin (JARDIANCE) 25 MG TABS tablet Take 25 mg by mouth daily.   Yes [provider]  losartan (COZAAR) 100 MG tablet Take 100 mg by mouth daily. 06/03/17  Yes [provider]  metoprolol succinate (TOPROL-XL) 50 MG 24 hr tablet Take 1 tablet by mouth daily. 11/09/16  Yes  [provider]  SOLIQUA 100-33 UNT-MCG/ML SOPN Inject 36 Units into the skin at bedtime.  11/16/16  Yes [provider]  sulfamethoxazole-trimethoprim (BACTRIM DS,SEPTRA DS) 800-160 MG tablet Take 1 tablet by mouth 2 (two) times daily. 14 day course starting on 07/19/2017 07/19/17  Yes [provider]    Family History Family History  Problem Relation Age of Onset  . Emphysema Maternal Grandfather        smoked  . Breast cancer Maternal Aunt     Social History Social History   Tobacco Use  . Smoking status: Former Smoker    Packs/day: 0.50    Years: 40.00    Pack years: 20.00    Types: Cigarettes    Last attempt to quit: 04/28/2012    Years since quitting: 5.2  . Smokeless tobacco: Former Engineer, water Use Topics  . Alcohol use: No  . Drug use: No     Allergies   Penicillins; Ciprofloxacin; Hydrocodone-acetaminophen; and Percocet [oxycodone-acetaminophen]   Review of Systems Review of Systems  All other systems reviewed and are negative.    Physical Exam Updated Vital Signs BP 127/78 (BP Location: Right Arm)   Pulse 96   Temp 98.5 F (36.9 C) (Oral)   Resp 18   Ht 5\' 4"  (1.626 m)   Wt 86.2 kg (190 lb)   SpO2 99%   BMI 32.61 kg/m   Physical Exam  Constitutional: She appears well-developed and well-nourished. No distress.  HENT:  Head: Normocephalic and atraumatic.  Mouth/Throat: Oropharynx is clear and moist. No oropharyngeal exudate.  Erythema to the left cheek with some central induration and a central area which is draining some purulent material.  Posterior pharynx is normal-appearing with normal phonation, dentition is well-appearing, no trismus  Eyes: Conjunctivae and EOM are normal. Pupils are equal, round, and reactive to light. Right eye exhibits no discharge. Left eye exhibits no discharge. No scleral icterus.  Neck: Normal range of motion. Neck supple. No JVD present. No thyromegaly present.  No objective redness or  streaking on the neck.  Very supple neck with no meningismus, no lymphadenopathy, no trismus or torticollis  Cardiovascular: Normal rate, regular rhythm, normal heart sounds and intact distal pulses. Exam reveals no gallop and no friction rub.  No murmur heard. Pulmonary/Chest: Effort normal and breath sounds normal. No respiratory distress. She has no wheezes. She has no rales.  Abdominal: Soft. Bowel sounds are normal. She exhibits no distension and no mass. There is no tenderness.  Musculoskeletal: Normal range of motion. She exhibits no edema or tenderness.  Lymphadenopathy:    She has no cervical adenopathy.  Neurological: She is alert. Coordination normal.  Skin: Skin is warm and dry. No rash noted. No erythema.  Psychiatric: She has a normal mood and affect. Her behavior is normal.  Nursing note and vitals reviewed.    ED Treatments / Results  Labs (  all labs ordered are listed, but only abnormal results are displayed) Labs Reviewed  CBC WITH DIFFERENTIAL/PLATELET - Abnormal; Notable for the following components:      Result Value   WBC 13.2 (*)    Neutro Abs 10.0 (*)    All other components within normal limits  BASIC METABOLIC PANEL - Abnormal; Notable for the following components:   Chloride 100 (*)    Glucose, Bld 415 (*)    BUN 30 (*)    Creatinine, Ser 1.76 (*)    GFR calc non Af Amer 30 (*)    GFR calc Af Amer 35 (*)    All other components within normal limits     Radiology Ct Maxillofacial W Contrast  Result Date: 07/20/2017 CLINICAL DATA:  Pimple on the left cheek 07/15/2017 with increased swelling since that time. EXAM: CT MAXILLOFACIAL WITH CONTRAST TECHNIQUE: Multidetector CT imaging of the maxillofacial structures was performed with intravenous contrast. Multiplanar CT image reconstructions were also generated. CONTRAST:  60mL ISOVUE-300 IOPAMIDOL (ISOVUE-300) INJECTION 61% COMPARISON:  None. FINDINGS: Osseous: No acute or aggressive finding. Orbits:  Symmetric prominence of the lacrimal glands that is incidental to the history. No acute inflammation or masslike appearance. Sinuses: No sinusitis.  Patent bilateral nasal cavity. Soft tissues: There is skin thickening and subcutaneous reticulation over the left cheek. Inflammation reaches the level of the masseter and parotid duct. No abscess or soft tissue gas. No opaque foreign body. Right carotid bifurcation atherosclerosis with evidence of high-grade right ICA bulb stenosis. Limited intracranial: Negative. IMPRESSION: 1. Cellulitis of the left cheek without abscess or soft tissue gas. No deep space inflammation. 2. Incidental cervical carotid atherosclerosis with apparent advanced right ICA bulb stenosis. Recommend follow-up for neck CTA. Electronically Signed   By: Marnee SpringJonathon  Watts M.D.   On: 07/20/2017 16:47    Procedures Procedures (including critical care time)  Medications Ordered in ED Medications  sodium chloride 0.9 % bolus 1,000 mL (1,000 mLs Intravenous New Bag/Given 07/20/17 1546)  vancomycin (VANCOCIN) IVPB 1000 mg/200 mL premix (1,000 mg Intravenous New Bag/Given 07/20/17 1546)  iopamidol (ISOVUE-300) 61 % injection 60 mL (60 mLs Intravenous Contrast Given 07/20/17 1629)     Initial Impression / Assessment and Plan / ED Course  I have reviewed the triage vital signs and the nursing notes.  Pertinent labs & imaging results that were available during my care of the patient were reviewed by me and considered in my medical decision making (see chart for details).     Patient has an infection in her neck which will need further evaluation with imaging to see if there is a true abscess here as it is very equivocal on exam.  There is obviously some purulent material but no central fluctuant area to cut.  No significant cellulitis.  She has been on antibiotics less than 24 hours and I would not expect to see significant improvement at this point.  CT negative for acute abscess,  leukocytosis is present at 13,200, creatinine seems to be baseline, patient informed of the results including the vascular abnormalities on the CT scan, vascular surgery follow-up encouraged, patient is expressed her understanding.  I have taken a picture of the patient on her own cell phone to take to her doctor in 48 hours and encouraged her to take q. 24-hour pictures.  She agreed, she Artie has a prescription for Bactrim which should be an appropriate medicine to cover both MRSA and MSSA.  She reports to me that her swelling and  redness has not increased since yesterday and the friend who is with the patient here states that she thought it looked better from yesterday  Final Clinical Impressions(s) / ED Diagnoses   Final diagnoses:  Facial cellulitis    ED Discharge Orders    None       Eber Hong, MD 07/20/17 [phone]

## 2017-07-20 NOTE — ED Triage Notes (Signed)
Pt reports was sent from pcp office for evaluation related to staph infection. Pt reports received abx injection yesterday and was prescribed abx. Pt reports was re-evaluated by PCP and was told to come to ED. Pt reports intermittent fever. Pt reports "redness is spreading and site began as pimple."

## 2017-07-20 NOTE — Discharge Instructions (Signed)
Your x-rays today reveal that you do have a cellulitis but there is no signs of abscess and no need for an incision or drainage procedure on your facial infection.  Please continue to take Bactrim, twice a day, follow-up with your family doctor within 48 hours, take pictures of your face every 24 hours to show either improvement or worsening of the symptoms and return to the emergency department if you develop increased pain fever swelling or redness.  You should be using warm compresses several times a day  You must!  Follow-up with the vascular surgeon listed above.  This is very important as your CT scan did show that you had some arteries that were narrowed in your neck, this could lead to problems in the future so you should need to follow-up with a vascular surgeon for further evaluation

## 2017-07-21 ENCOUNTER — Other Ambulatory Visit: Payer: Self-pay

## 2017-07-21 DIAGNOSIS — I6529 Occlusion and stenosis of unspecified carotid artery: Secondary | ICD-10-CM

## 2017-08-10 ENCOUNTER — Ambulatory Visit: Payer: 59 | Admitting: Vascular Surgery

## 2017-08-10 ENCOUNTER — Other Ambulatory Visit: Payer: Self-pay

## 2017-08-10 ENCOUNTER — Encounter: Payer: Self-pay | Admitting: Vascular Surgery

## 2017-08-10 ENCOUNTER — Ambulatory Visit (HOSPITAL_COMMUNITY)
Admission: RE | Admit: 2017-08-10 | Discharge: 2017-08-10 | Disposition: A | Discharge status: Home / Self Care | Payer: 59 | Source: Ambulatory Visit | Attending: Vascular Surgery | Admitting: Vascular Surgery

## 2017-08-10 VITALS — BP 119/81 | HR 98 | Temp 98.0°F | Resp 16 | Ht 64.0 in | Wt 191.0 lb

## 2017-08-10 DIAGNOSIS — I6521 Occlusion and stenosis of right carotid artery: Secondary | ICD-10-CM | POA: Diagnosis not present

## 2017-08-10 DIAGNOSIS — I6529 Occlusion and stenosis of unspecified carotid artery: Secondary | ICD-10-CM | POA: Insufficient documentation

## 2017-08-10 LAB — VAS US CAROTID
LEFT ECA DIAS: -15 cm/s
LEFT VERTEBRAL DIAS: -17 cm/s
Left CCA dist dias: -15 cm/s
Left CCA dist sys: -63 cm/s
Left CCA prox dias: 27 cm/s
Left CCA prox sys: 119 cm/s
Left ICA dist dias: -35 cm/s
Left ICA dist sys: -107 cm/s
Left ICA prox dias: -31 cm/s
Left ICA prox sys: -83 cm/s
RIGHT CCA MID DIAS: 37 cm/s
RIGHT ECA DIAS: -26 cm/s
RIGHT VERTEBRAL DIAS: -28 cm/s
Right CCA prox dias: 28 cm/s
Right CCA prox sys: 107 cm/s
Right cca dist sys: -74 cm/s

## 2017-08-10 NOTE — Progress Notes (Signed)
Patient ID: Kelli BongVicky P Summers, female   DOB: [birth year], 62 y.o.   MRN: [id number]  Reason for Consult: New Patient (Initial Visit) (Ref from ED.  Carotid stenosis. )   Referred by Assunta FoundGolding, John, MD  Subjective:     HPI:  Kelli Summers is a 62 y.o. female with previous history of smoking also has diabetes and hypertension.  She was recently evaluated in the emergency department had an area of abscess on her left face and had a CT scan performed.  The scan demonstrated calcification in her proximal internal carotid artery on the right.  She denies ever having history of stroke TIA or amaurosis despite her risk factors.  She does not have any limitation to her walking   Other than chronic knee pain.  She is free of complaints at today's visit.  Past Medical History:  Diagnosis Date  . Arthritis   . Chronic back pain   . Chronic knee pain   . Depression   . Diabetes mellitus   . Ejection fraction   . History of stress test 01/2010   normal myoview stress test  . Hypertension   . Sinus tachycardia    Family History  Problem Relation Age of Onset  . Emphysema Maternal Grandfather        smoked  . Breast cancer Maternal Aunt    Past Surgical History:  Procedure Laterality Date  . ABDOMINAL HYSTERECTOMY    . BACK SURGERY    . CHOLECYSTECTOMY    . COLONOSCOPY N/A 04/15/2015   Procedure: COLONOSCOPY;  Surgeon: Franky MachoMark Jenkins, MD;  Location: [facility];  Service: Gastroenterology;  Laterality: N/A;  . CYST EXCISION Left 04/02/2016   Procedure: EXCISION 1 CM CYST ON LEFT SIDE OF FACE;  Surgeon: Franky MachoMark Jenkins, MD;  Location: [facility];  Service: General;  Laterality: Left;  Marland Kitchen. VIDEO ASSISTED THORACOSCOPY (VATS)/DECORTICATION Right 05/09/2013   Procedure: VIDEO ASSISTED THORACOSCOPY (VATS)/DECORTICATION;  Surgeon: Loreli SlotSteven C Hendrickson, MD;  [facility];  Service: Thoracic;  Laterality: Right;  . WEDGE RESECTION Right 05/09/2013   Procedure: WEDGE RESECTION RIGHT LOWER LOBE;  Surgeon:  Loreli SlotSteven C Hendrickson, MD;  [facility];  Service: Thoracic;  Laterality: Right;    Short Social History:  Social History   Tobacco Use  . Smoking status: Former Smoker    Packs/day: 0.50    Years: 40.00    Pack years: 20.00    Types: Cigarettes    Last attempt to quit: 04/28/2012    Years since quitting: 5.2  . Smokeless tobacco: Former Engineer, waterUser  Substance Use Topics  . Alcohol use: No    Allergies  Allergen Reactions  . Penicillins Hives, Shortness Of Breath and Itching    Tolerates Ceftin Has patient had a PCN reaction causing immediate rash, facial/tongue/throat swelling, SOB or lightheadedness with hypotension: Yes Has patient had a PCN reaction causing severe rash involving mucus membranes or skin necrosis: No Has patient had a PCN reaction that required hospitalization No Has patient had a PCN reaction occurring within the last 10 years: No If all of the above answers are "NO", then may proceed with Cephalosporin use.   . Ciprofloxacin Hives and Itching  . Hydrocodone-Acetaminophen Itching    Pt can take acetaminophen  . Percocet [Oxycodone-Acetaminophen] Itching    Current Outpatient Medications  Medication Sig Dispense Refill  . albuterol (PROVENTIL HFA;VENTOLIN HFA) 108 (90 Base) MCG/ACT inhaler Inhale 2 puffs into the lungs every 6 (six) hours as needed for wheezing or  shortness of breath. 1 Inhaler 2  . DULoxetine (CYMBALTA) 60 MG capsule Take 60 mg by mouth daily.    . empagliflozin (JARDIANCE) 25 MG TABS tablet Take 25 mg by mouth daily.    Marland Kitchen losartan (COZAAR) 100 MG tablet Take 100 mg by mouth daily.  0  . metoprolol succinate (TOPROL-XL) 50 MG 24 hr tablet Take 1 tablet by mouth daily.  0  . SOLIQUA 100-33 UNT-MCG/ML SOPN Inject 36 Units into the skin at bedtime.      No current facility-administered medications for this visit.     Review of Systems  Constitutional:  Constitutional negative. HENT: HENT negative.  Eyes: Eyes negative.  Cardiovascular:  Positive for dyspnea with exertion.  GI: Gastrointestinal negative.  Musculoskeletal: Musculoskeletal negative.  Skin: Positive for wound.  Neurological: Neurological negative. Hematologic: Hematologic/lymphatic negative.  Psychiatric: Psychiatric negative.        Objective:  Objective   Vitals:   08/10/17 1355 08/10/17 1358  BP: 114/73 119/81  Pulse: 98   Resp: 16   Temp: 98 F (36.7 C)   TempSrc: Oral   SpO2: 98%   Weight: 191 lb (86.6 kg)   Height: 5\' 4"  (1.626 m)    Body mass index is 32.79 kg/m.  Physical Exam  Constitutional: She is oriented to person, place, and time. She appears well-developed.  HENT:  Head: Normocephalic.  Eyes: Pupils are equal, round, and reactive to light.  Neck: Normal range of motion. Neck supple.  Cardiovascular: Normal rate.  Pulses:      Carotid pulses are 2+ on the right side, and 2+ on the left side.      Radial pulses are 2+ on the right side, and 2+ on the left side.  Pulmonary/Chest: Effort normal.  Abdominal: Soft. She exhibits no mass.  Musculoskeletal: Normal range of motion. She exhibits no edema.  Lymphadenopathy:    She has no cervical adenopathy.  Neurological: She is alert and oriented to person, place, and time.  Skin: Skin is warm and dry.  Psychiatric: She has a normal mood and affect. Her behavior is normal. Judgment and thought content normal.    Data: I have independently interpreted her bilateral carotid duplex which demonstrates a 40-59% stenosis on the right with peak systolic velocity 167 and heterogeneous plaque.  On the left there is a 1-39% stenosis.   CT maxillofacial IMPRESSION: 1. Cellulitis of the left cheek without abscess or soft tissue gas. No deep space inflammation. 2. Incidental cervical carotid atherosclerosis with apparent advanced right ICA bulb stenosis. Recommend follow-up for neck CTA.      Assessment/Plan:     62 year old female follows up in our office after having CT scan  demonstrating calcification in her right internal carotid artery.  Her duplex today demonstrates 40-59% stenosis and she is asymptomatic.  From that standpoint she does not need intervention at this time and can follow-up in 1 year with repeat carotid duplex.  I discussed with her again the signs and symptoms of stroke she agrees to seek emergent medical attention should she have any of these.  I have also asked her to begin taking aspirin and she should also consider statin therapy at the direction of her primary care doctor.     Maeola Harman MD [facility]

## 2017-09-08 ENCOUNTER — Encounter (HOSPITAL_COMMUNITY): Payer: 59

## 2017-09-08 ENCOUNTER — Encounter: Payer: 59 | Admitting: Vascular Surgery

## 2018-11-16 DIAGNOSIS — E1165 Type 2 diabetes mellitus with hyperglycemia: Secondary | ICD-10-CM | POA: Diagnosis not present

## 2018-11-16 DIAGNOSIS — E6609 Other obesity due to excess calories: Secondary | ICD-10-CM | POA: Diagnosis not present

## 2018-11-16 DIAGNOSIS — R5383 Other fatigue: Secondary | ICD-10-CM | POA: Diagnosis not present

## 2018-11-16 DIAGNOSIS — E7849 Other hyperlipidemia: Secondary | ICD-10-CM | POA: Diagnosis not present

## 2018-11-16 DIAGNOSIS — Z6832 Body mass index (BMI) 32.0-32.9, adult: Secondary | ICD-10-CM | POA: Diagnosis not present

## 2018-11-16 DIAGNOSIS — I10 Essential (primary) hypertension: Secondary | ICD-10-CM | POA: Diagnosis not present

## 2018-11-17 ENCOUNTER — Emergency Department (HOSPITAL_COMMUNITY): Payer: BLUE CROSS/BLUE SHIELD

## 2018-11-17 ENCOUNTER — Encounter (HOSPITAL_COMMUNITY): Payer: Self-pay | Admitting: Emergency Medicine

## 2018-11-17 ENCOUNTER — Emergency Department (HOSPITAL_COMMUNITY)
Admission: EM | Admit: 2018-11-17 | Discharge: 2018-11-18 | Disposition: A | Discharge status: Home / Self Care | Payer: BLUE CROSS/BLUE SHIELD | Attending: Emergency Medicine | Admitting: Emergency Medicine

## 2018-11-17 ENCOUNTER — Other Ambulatory Visit: Payer: Self-pay

## 2018-11-17 DIAGNOSIS — Z79899 Other long term (current) drug therapy: Secondary | ICD-10-CM | POA: Insufficient documentation

## 2018-11-17 DIAGNOSIS — S4991XA Unspecified injury of right shoulder and upper arm, initial encounter: Secondary | ICD-10-CM | POA: Diagnosis not present

## 2018-11-17 DIAGNOSIS — E119 Type 2 diabetes mellitus without complications: Secondary | ICD-10-CM | POA: Diagnosis not present

## 2018-11-17 DIAGNOSIS — Y9289 Other specified places as the place of occurrence of the external cause: Secondary | ICD-10-CM | POA: Insufficient documentation

## 2018-11-17 DIAGNOSIS — I10 Essential (primary) hypertension: Secondary | ICD-10-CM | POA: Diagnosis not present

## 2018-11-17 DIAGNOSIS — S62111A Displaced fracture of triquetrum [cuneiform] bone, right wrist, initial encounter for closed fracture: Secondary | ICD-10-CM

## 2018-11-17 DIAGNOSIS — W19XXXA Unspecified fall, initial encounter: Secondary | ICD-10-CM

## 2018-11-17 DIAGNOSIS — W010XXA Fall on same level from slipping, tripping and stumbling without subsequent striking against object, initial encounter: Secondary | ICD-10-CM | POA: Diagnosis not present

## 2018-11-17 DIAGNOSIS — Z87891 Personal history of nicotine dependence: Secondary | ICD-10-CM | POA: Insufficient documentation

## 2018-11-17 DIAGNOSIS — Y998 Other external cause status: Secondary | ICD-10-CM | POA: Insufficient documentation

## 2018-11-17 DIAGNOSIS — Y9389 Activity, other specified: Secondary | ICD-10-CM | POA: Insufficient documentation

## 2018-11-17 DIAGNOSIS — S40011A Contusion of right shoulder, initial encounter: Secondary | ICD-10-CM

## 2018-11-17 DIAGNOSIS — E1165 Type 2 diabetes mellitus with hyperglycemia: Secondary | ICD-10-CM | POA: Diagnosis not present

## 2018-11-17 DIAGNOSIS — S6991XA Unspecified injury of right wrist, hand and finger(s), initial encounter: Secondary | ICD-10-CM | POA: Diagnosis not present

## 2018-11-17 NOTE — ED Triage Notes (Signed)
Patient states that she slipped an fell last night injuring her right hand arm and shoulder. Patient complaining of pain to the right shoulder, right hand. Patient does have swelling to the right hand.

## 2018-11-18 NOTE — ED Provider Notes (Signed)
[facility] EMERGENCY DEPARTMENT Provider Note   CSN: [id number] Arrival date & time: 11/17/18  2117    History   Chief Complaint Chief Complaint  Patient presents with  . Arm Injury    fall/ pain r arm    HPI Kelli Summers is a 63 y.o. female.     The history is provided by the patient.  Arm Injury  Location:  Shoulder and wrist Shoulder location:  R shoulder Wrist location:  R wrist Injury: yes   Time since incident:  1 day Mechanism of injury: fall   Fall:    Fall occurred:  Standing   Impact surface:  Hard floor Pain details:    Quality:  Aching and throbbing   Severity:  Moderate   Onset quality:  Sudden   Duration:  1 day   Timing:  Constant   Progression:  Worsening Handedness:  Right-handed Dislocation: no   Foreign body present:  No foreign bodies Prior injury to area:  No Relieved by:  Nothing Worsened by:  Movement Ineffective treatments:  Acetaminophen Associated symptoms: no back pain, no neck pain and no numbness   Risk factors: no frequent fractures and no recent illness     Past Medical History:  Diagnosis Date  . Arthritis   . Chronic back pain   . Chronic knee pain   . Depression   . Diabetes mellitus   . Ejection fraction   . History of stress test 01/2010   normal myoview stress test  . Hypertension   . Sinus tachycardia     Patient Active Problem List   Diagnosis Date Noted  . CKD (chronic kidney disease), stage II 11/23/2016  . AKI (acute kidney injury) (HCC) 11/23/2016  . Hyponatremia 11/23/2016  . Uncontrolled blood glucose 11/23/2016  . Depression 11/23/2016  . COPD with acute exacerbation (HCC) 11/13/2013  . Smoking 11/13/2013  . Hypertension 10/15/2013  . Ejection fraction   . Sinus tachycardia   . Anemia, blood loss 05/10/2013  . Acute hyperglycemia 04/30/2013  . SHOULDER PAIN 03/02/2007  . Insulin-requiring or dependent type II diabetes mellitus (HCC) 02/14/2007    Past Surgical History:  Procedure  Laterality Date  . ABDOMINAL HYSTERECTOMY    . BACK SURGERY    . CHOLECYSTECTOMY    . COLONOSCOPY N/A 04/15/2015   Procedure: COLONOSCOPY;  Surgeon: Franky Macho, MD;  Location: [facility];  Service: Gastroenterology;  Laterality: N/A;  . CYST EXCISION Left 04/02/2016   Procedure: EXCISION 1 CM CYST ON LEFT SIDE OF FACE;  Surgeon: Franky Macho, MD;  Location: [facility]S;  Service: General;  Laterality: Left;  Marland Kitchen VIDEO ASSISTED THORACOSCOPY (VATS)/DECORTICATION Right 05/09/2013   Procedure: VIDEO ASSISTED THORACOSCOPY (VATS)/DECORTICATION;  Surgeon: Loreli Slot, MD;  [facility] OR;  Service: Thoracic;  Laterality: Right;  . WEDGE RESECTION Right 05/09/2013   Procedure: WEDGE RESECTION RIGHT LOWER LOBE;  Surgeon: Loreli Slot, MD;  Location: [facility] OR;  Service: Thoracic;  Laterality: Right;     OB History   No obstetric history on file.      Home Medications    Prior to Admission medications   Medication Sig Start Date End Date Taking? Authorizing Provider  albuterol (PROVENTIL HFA;VENTOLIN HFA) 108 (90 Base) MCG/ACT inhaler Inhale 2 puffs into the lungs every 6 (six) hours as needed for wheezing or shortness of breath. 11/24/16   Erick Blinks, MD  DULoxetine (CYMBALTA) 60 MG capsule Take 60 mg by mouth daily.    [provider]  empagliflozin (JARDIANCE) 25 MG TABS tablet Take 25 mg by mouth daily.    [provider]  losartan (COZAAR) 100 MG tablet Take 100 mg by mouth daily. 06/03/17   [provider]  metoprolol succinate (TOPROL-XL) 50 MG 24 hr tablet Take 1 tablet by mouth daily. 11/09/16   [provider]  SOLIQUA 100-33 UNT-MCG/ML SOPN Inject 36 Units into the skin at bedtime.  11/16/16   [provider]    Family History Family History  Problem Relation Age of Onset  . Emphysema Maternal Grandfather        smoked  . Breast cancer Maternal Aunt     Social History Social History   Tobacco Use  . Smoking status:  Former Smoker    Packs/day: 0.50    Years: 40.00    Pack years: 20.00    Types: Cigarettes    Last attempt to quit: 04/28/2012    Years since quitting: 6.5  . Smokeless tobacco: Former Engineer, water Use Topics  . Alcohol use: No  . Drug use: No     Allergies   Penicillins; Ciprofloxacin; Hydrocodone-acetaminophen; and Percocet [oxycodone-acetaminophen]   Review of Systems Review of Systems  Constitutional: Negative for activity change.       All ROS Neg except as noted in HPI  HENT: Negative for nosebleeds.   Eyes: Negative for photophobia and discharge.  Respiratory: Negative for cough, shortness of breath and wheezing.   Cardiovascular: Negative for chest pain and palpitations.  Gastrointestinal: Negative for abdominal pain and blood in stool.  Genitourinary: Negative for dysuria, frequency and hematuria.  Musculoskeletal: Negative for arthralgias, back pain and neck pain.  Skin: Negative.   Neurological: Negative for dizziness, seizures and speech difficulty.  Psychiatric/Behavioral: Negative for confusion and hallucinations.     Physical Exam Updated Vital Signs BP (!) 165/87   Pulse 96   Temp 98.1 F (36.7 C) (Oral)   Resp 17   Ht  (1.626 m)   Wt 85.3 kg   SpO2 97%   BMI 32.27 kg/m   Physical Exam Vitals signs and nursing note reviewed.  Constitutional:      Appearance: She is well-developed. She is not toxic-appearing.  HENT:     Head: Normocephalic.     Right Ear: Tympanic membrane and external ear normal.     Left Ear: Tympanic membrane and external ear normal.  Eyes:     General: Lids are normal.     Pupils: Pupils are equal, round, and reactive to light.  Neck:     Musculoskeletal: Normal range of motion and neck supple.     Vascular: No carotid bruit.  Cardiovascular:     Rate and Rhythm: Normal rate and regular rhythm.     Pulses: Normal pulses.     Heart sounds: Normal heart sounds.  Pulmonary:     Effort: No respiratory distress.      Breath sounds: Normal breath sounds.  Abdominal:     General: Bowel sounds are normal.     Palpations: Abdomen is soft.     Tenderness: There is no abdominal tenderness. There is no guarding.  Musculoskeletal: Normal range of motion.     Right shoulder: She exhibits pain.     Right wrist: She exhibits tenderness.  Lymphadenopathy:     Head:     Right side of head: No submandibular adenopathy.     Left side of head: No submandibular adenopathy.     Cervical: No  cervical adenopathy.  Skin:    General: Skin is warm and dry.  Neurological:     Mental Status: She is alert and oriented to person, place, and time.     Cranial Nerves: No cranial nerve deficit.     Sensory: No sensory deficit.  Psychiatric:        Speech: Speech normal.      ED Treatments / Results  Labs (all labs ordered are listed, but only abnormal results are displayed) Labs Reviewed - No data to display  EKG None  Radiology Dg Shoulder Right  Result Date: 11/17/2018 CLINICAL DATA:  Initial evaluation for acute trauma, fall. EXAM: RIGHT SHOULDER - 2+ VIEW COMPARISON:  None. FINDINGS: No acute fracture dislocation. Humeral head in normal alignment with the glenoid. AC joint approximated. Osteoarthritic changes noted about the right glenohumeral and acromioclavicular articulations. No periarticular calcification. Visualized right hemithorax grossly clear. Osteopenia noted. IMPRESSION: 1. No acute fracture or dislocation. 2. Moderate degenerative osteoarthrosis about the right glenohumeral and acromioclavicular articulations. Electronically Signed   By: Rise MuBenjamin  McClintock M.D.   On: 11/17/2018 22:28   Dg Wrist Complete Right  Result Date: 11/17/2018 CLINICAL DATA:  Initial evaluation for acute trauma, recent fall. EXAM: RIGHT WRIST - COMPLETE 3+ VIEW COMPARISON:  None. FINDINGS: Subtle cortical irregularity overlies the proximal carpal row on lateral projection, which could reflect a subtle acute nondisplaced  triquetral fracture. This is not definitely seen on corresponding views. Scaphoid intact. No other acute fracture or dislocation about the wrist. Minimal spurring noted at the ulnar styloid. Mild diffuse soft tissue swelling. IMPRESSION: 1. Subtle cortical irregularity overlying the proximal carpal row on lateral projection, which could reflect a subtle acute nondisplaced triquetral fracture. Correlation with physical exam recommended. 2. No other acute osseous abnormality about the right wrist. Electronically Signed   By: Rise MuBenjamin  McClintock M.D.   On: 11/17/2018 22:26    Procedures Procedures (including critical care time)  FRACTURE CARE RIGHT WRIST. Patient sustained a fall on yesterday and injured the right wrist.  X-ray reveals a triquetral fracture.  I discussed the fracture with the patient in terms which he understands.  Have also discussed immobilization with her.  The patient gives permission for the procedure.  Patient identified by armband.  Procedural timeout taken.  Patient fitted with an ulnar gutter splint and sling.  After the application, the capillary refill is less than 2 seconds.  No temperature changes appreciated.  No sensation of the splint being too tight.  Pain medication was offered, but the patient states that she had ibuprofen prior to coming to the emergency department and she did not need medication at this time.  Patient tolerated the procedure without problem.   Medications Ordered in ED Medications - No data to display   Initial Impression / Assessment and Plan / ED Course  I have reviewed the triage vital signs and the nursing notes.  Pertinent labs & imaging results that were available during my care of the patient were reviewed by me and considered in my medical decision making (see chart for details).          Final Clinical Impressions(s) / ED Diagnoses  MDM Patient sustained a fall and injured the right shoulder and the right wrist on  yesterday.  The patient continued to have pain and was urged by family to come and have it checked out.  X-ray of the right shoulder is negative for fracture or dislocation.  There is noted arthritis changes present.  X-ray  of the right wrist shows a triquetral fracture.  The patient is placed in a ulnar gutter splint and sling.  No neurovascular deficits appreciated on the examination.  No other injuries reported.  The patient will use Tylenol and ibuprofen for pain at her request.  A consultation to Dr. Romeo Apple has been given to the patient, as the patient has been seen by Dr. Romeo Apple for other orthopedic services in the past.  Patient advised to return to the emergency department if any changes in her symptoms, problems, or concerns.   Final diagnoses:  Closed chip fracture of triquetrum of right wrist, initial encounter  Contusion of right shoulder, initial encounter  Fall, initial encounter    ED Discharge Orders    None       Ivery Quale, PA-C 11/18/18 0159    Glynn Octave, MD 11/18/18 [phone]

## 2018-11-18 NOTE — Discharge Instructions (Signed)
Your shoulder x-ray is negative for fracture or dislocation.  It does show some arthritis present.  You have a fracture of a small bone in your wrist called the triquetrum.  Please use the splint and sling until you are seen by orthopedics.  Please call Dr. Romeo Apple for an appointment as soon as possible.  Apply ice to the area.

## 2018-11-23 DIAGNOSIS — H25813 Combined forms of age-related cataract, bilateral: Secondary | ICD-10-CM | POA: Diagnosis not present

## 2018-11-23 DIAGNOSIS — E119 Type 2 diabetes mellitus without complications: Secondary | ICD-10-CM | POA: Diagnosis not present

## 2019-01-05 DIAGNOSIS — H25811 Combined forms of age-related cataract, right eye: Secondary | ICD-10-CM | POA: Diagnosis not present

## 2019-01-05 NOTE — Patient Instructions (Signed)
Nils FlackVicky P Chenier  01/05/2019     @PREFPERIOPPHARMACY @   Your procedure is scheduled on  01/12/2019.  [facility]  [facility].  Call this number if you have problems the morning of surgery:  [phone]   Remember:  Do not eat or drink after midnight.                         Take these medicines the morning of surgery with A SIP OF WATER  Metoprolol. Use your inhaler before you come. Take 1/2 of your night time insulin dosage. DO NOT take any medications for diabetes the morning of your surgery.    Do not wear jewelry, make-up or nail polish.  Do not wear lotions, powders, or perfumes. Brush your teeth the morning of your surgery.  Do not shave 48 hours prior to surgery.  Men may shave face and neck.  Do not bring valuables to the hospital.  [facility] belongings or [facility]uables.  Contacts, dentures or bridgework may not be worn into surgery.  Leave your suitcase in the car.  After surgery it may be brought to your room.  For patients admitted to the hospital, discharge time will be determined by your treatment team.  Patients discharged the day of surgery will not be allowed to drive home.   Name and phone number of your driver:   Family Special instructions:  None  Please read over the following fact sheets that you were given. Anesthesia Post-op Instructions and Care and Recovery After Surgery       Cataract Surgery, Care After This sheet gives you information about how to care for yourself after your procedure. Your health care provider may also give you more specific instructions. If you have problems or questions, contact your health care provider. What can I expect after the procedure? After the procedure, it is common to have:  Itching.  Discomfort.  Fluid discharge.  Sensitivity to light and to touch.  Bruising in or around the eye.  Mild blurred vision. Follow these instructions at home: Eye care   Do not  touch or rub your eyes.  Protect your eyes as told by your health care provider. You may be told to wear a protective eye shield or sunglasses.  Do not put a contact lens into the affected eye or eyes until your health care provider approves.  Keep the area around your eye clean and dry: ? Avoid swimming. ? Do not allow water to hit you directly in the face while showering. ? Keep soap and shampoo out of your eyes.  Check your eye every day for signs of infection. Watch for: ? Redness, swelling, or pain. ? Fluid, blood, or pus. ? Warmth. ? A bad smell. ? Vision that is getting worse. ? Sensitivity that is getting worse. Activity  Do not drive for 24 hours if you were given a sedative during your procedure.  Avoid strenuous activities, such as playing contact sports, for as long as told by your health care provider.  Do not drive or use heavy machinery until your health care provider approves.  Do not bend or lift heavy objects. Bending increases pressure in the eye. You can walk, climb stairs, and do light household chores.  Ask your health care provider when you can return to work. If you work in a dusty environment, you may be advised to  wear protective eyewear for a period of time. General instructions  Take or apply over-the-counter and prescription medicines only as told by your health care provider. This includes eye drops.  Keep all follow-up visits as told by your health care provider. This is important. Contact a health care provider if:  You have increased bruising around your eye.  You have pain that is not helped with medicine.  You have a fever.  You have redness, swelling, or pain in your eye.  You have fluid, blood, or pus coming from your incision.  Your vision gets worse.  Your sensitivity to light gets worse. Get help right away if:  You have sudden loss of vision.  You see flashes of light or spots (floaters).  You have severe eye pain.  You  develop nausea or vomiting. Summary  After your procedure, it is common to have itching, discomfort, bruising, fluid discharge, or sensitivity to light.  Follow instructions from your health care provider about caring for your eye after the procedure.  Do not rub your eye after the procedure. You may need to wear eye protection or sunglasses. Do not wear contact lenses. Keep the area around your eye clean and dry.  Avoid activities that require a lot of effort. These include playing sports and lifting heavy objects.  Contact a health care provider if you have increased bruising, pain that does not go away, or a fever. Get help right away if you suddenly lose your vision, see flashes of light or spots, or have severe pain in the eye. This information is not intended to replace advice given to you by your health care provider. Make sure you discuss any questions you have with your health care provider. Document Released: 01/01/2005 Document Revised: 12/12/2017 Document Reviewed: 12/12/2017 Elsevier Patient Education  2020 [employer] Monitored Anesthesia Care, Care After These instructions provide you with information about caring for yourself after your procedure. Your health care provider may also give you more specific instructions. Your treatment has been planned according to current medical practices, but problems sometimes occur. Call your health care provider if you have any problems or questions after your procedure. What can I expect after the procedure? After your procedure, you may:  Feel sleepy for several hours.  Feel clumsy and have poor balance for several hours.  Feel forgetful about what happened after the procedure.  Have poor judgment for several hours.  Feel nauseous or vomit.  Have a sore throat if you had a breathing tube during the procedure. Follow these instructions at home: For at least 24 hours after the procedure:      Have a responsible adult stay  with you. It is important to have someone help care for you until you are awake and alert.  Rest as needed.  Do not: ? Participate in activities in which you could fall or become injured. ? Drive. ? Use heavy machinery. ? Drink alcohol. ? Take sleeping pills or medicines that cause drowsiness. ? Make important decisions or sign legal documents. ? Take care of children on your own. Eating and drinking  Follow the diet that is recommended by your health care provider.  If you vomit, drink water, juice, or soup when you can drink without vomiting.  Make sure you have little or no nausea before eating solid foods. General instructions  Take over-the-counter and prescription medicines only as told by your health care provider.  If you have sleep apnea, surgery and certain medicines can increase  your risk for breathing problems. Follow instructions from your health care provider about wearing your sleep device: ? Anytime you are sleeping, including during daytime naps. ? While taking prescription pain medicines, sleeping medicines, or medicines that make you drowsy.  If you smoke, do not smoke without supervision.  Keep all follow-up visits as told by your health care provider. This is important. Contact a health care provider if:  You keep feeling nauseous or you keep vomiting.  You feel light-headed.  You develop a rash.  You have a fever. Get help right away if:  You have trouble breathing. Summary  For several hours after your procedure, you may feel sleepy and have poor judgment.  Have a responsible adult stay with you for at least 24 hours or until you are awake and alert. This information is not intended to replace advice given to you by your health care provider. Make sure you discuss any questions you have with your health care provider. Document Released: 10/05/2015 Document Revised: 09/12/2017 Document Reviewed: 10/05/2015 Elsevier Patient Education  2020  [employer].

## 2019-01-09 ENCOUNTER — Encounter (HOSPITAL_COMMUNITY): Payer: Self-pay

## 2019-01-09 ENCOUNTER — Other Ambulatory Visit: Payer: Self-pay

## 2019-01-09 ENCOUNTER — Encounter (HOSPITAL_COMMUNITY)
Admission: RE | Admit: 2019-01-09 | Discharge: 2019-01-09 | Disposition: A | Discharge status: Home / Self Care | Payer: BC Managed Care – PPO | Source: Ambulatory Visit | Attending: Ophthalmology | Admitting: Ophthalmology

## 2019-01-09 ENCOUNTER — Other Ambulatory Visit (HOSPITAL_COMMUNITY)
Admission: RE | Admit: 2019-01-09 | Discharge: 2019-01-09 | Disposition: A | Discharge status: Home / Self Care | Payer: BC Managed Care – PPO | Source: Ambulatory Visit | Attending: Ophthalmology | Admitting: Ophthalmology

## 2019-01-09 DIAGNOSIS — Z01812 Encounter for preprocedural laboratory examination: Secondary | ICD-10-CM | POA: Insufficient documentation

## 2019-01-09 DIAGNOSIS — Z1159 Encounter for screening for other viral diseases: Secondary | ICD-10-CM | POA: Insufficient documentation

## 2019-01-09 LAB — BASIC METABOLIC PANEL
Anion gap: 9 (ref 5–15)
BUN: 29 mg/dL — ABNORMAL HIGH (ref 8–23)
CO2: 23 mmol/L (ref 22–32)
Calcium: 9 mg/dL (ref 8.9–10.3)
Chloride: 101 mmol/L (ref 98–111)
Creatinine, Ser: 1.54 mg/dL — ABNORMAL HIGH (ref 0.44–1.00)
GFR calc Af Amer: 41 mL/min — ABNORMAL LOW (ref >60)
GFR calc non Af Amer: 36 mL/min — ABNORMAL LOW (ref >60)
Glucose, Bld: 476 mg/dL — ABNORMAL HIGH (ref 70–99)
Potassium: 5.9 mmol/L — ABNORMAL HIGH (ref 3.5–5.1)
Sodium: 133 mmol/L — ABNORMAL LOW (ref 135–145)

## 2019-01-09 LAB — HEMOGLOBIN A1C
Hgb A1c MFr Bld: 10.7 % — ABNORMAL HIGH (ref 4.8–5.6)
Mean Plasma Glucose: 260.39 mg/dL

## 2019-01-10 LAB — SARS CORONAVIRUS 2 (TAT 6-24 HRS): SARS Coronavirus 2: NEGATIVE

## 2019-01-10 NOTE — Progress Notes (Signed)
A1C and glucose elevated.  Dr Marisa Hua notified and wishes to proceed with scheduled cataract surgery on 01/12/19.

## 2019-01-12 ENCOUNTER — Other Ambulatory Visit: Payer: Self-pay

## 2019-01-12 ENCOUNTER — Encounter (HOSPITAL_COMMUNITY)
Admission: RE | Disposition: A | Discharge status: Home / Self Care | Payer: Self-pay | Source: Home / Self Care | Attending: Ophthalmology

## 2019-01-12 ENCOUNTER — Ambulatory Visit (HOSPITAL_COMMUNITY): Payer: BC Managed Care – PPO | Admitting: Anesthesiology

## 2019-01-12 ENCOUNTER — Ambulatory Visit (HOSPITAL_COMMUNITY)
Admission: RE | Admit: 2019-01-12 | Discharge: 2019-01-12 | Disposition: A | Discharge status: Home / Self Care | Payer: BC Managed Care – PPO | Attending: Ophthalmology | Admitting: Ophthalmology

## 2019-01-12 ENCOUNTER — Encounter (HOSPITAL_COMMUNITY): Payer: Self-pay | Admitting: Anesthesiology

## 2019-01-12 DIAGNOSIS — Z880 Allergy status to penicillin: Secondary | ICD-10-CM | POA: Insufficient documentation

## 2019-01-12 DIAGNOSIS — N189 Chronic kidney disease, unspecified: Secondary | ICD-10-CM | POA: Diagnosis not present

## 2019-01-12 DIAGNOSIS — H25811 Combined forms of age-related cataract, right eye: Secondary | ICD-10-CM | POA: Diagnosis not present

## 2019-01-12 DIAGNOSIS — D631 Anemia in chronic kidney disease: Secondary | ICD-10-CM | POA: Insufficient documentation

## 2019-01-12 DIAGNOSIS — Z87891 Personal history of nicotine dependence: Secondary | ICD-10-CM | POA: Diagnosis not present

## 2019-01-12 DIAGNOSIS — Z881 Allergy status to other antibiotic agents status: Secondary | ICD-10-CM | POA: Diagnosis not present

## 2019-01-12 DIAGNOSIS — Z794 Long term (current) use of insulin: Secondary | ICD-10-CM | POA: Insufficient documentation

## 2019-01-12 DIAGNOSIS — H259 Unspecified age-related cataract: Secondary | ICD-10-CM | POA: Insufficient documentation

## 2019-01-12 DIAGNOSIS — E1122 Type 2 diabetes mellitus with diabetic chronic kidney disease: Secondary | ICD-10-CM | POA: Diagnosis not present

## 2019-01-12 DIAGNOSIS — Z888 Allergy status to other drugs, medicaments and biological substances status: Secondary | ICD-10-CM | POA: Diagnosis not present

## 2019-01-12 DIAGNOSIS — F329 Major depressive disorder, single episode, unspecified: Secondary | ICD-10-CM | POA: Diagnosis not present

## 2019-01-12 DIAGNOSIS — I129 Hypertensive chronic kidney disease with stage 1 through stage 4 chronic kidney disease, or unspecified chronic kidney disease: Secondary | ICD-10-CM | POA: Insufficient documentation

## 2019-01-12 DIAGNOSIS — J449 Chronic obstructive pulmonary disease, unspecified: Secondary | ICD-10-CM | POA: Insufficient documentation

## 2019-01-12 HISTORY — PX: CATARACT EXTRACTION W/PHACO: SHX586

## 2019-01-12 LAB — GLUCOSE, CAPILLARY: Glucose-Capillary: 420 mg/dL — ABNORMAL HIGH (ref 70–99)

## 2019-01-12 SURGERY — PHACOEMULSIFICATION, CATARACT, WITH IOL INSERTION
Anesthesia: Monitor Anesthesia Care | Site: Eye | Laterality: Right

## 2019-01-12 MED ORDER — TETRACAINE HCL 0.5 % OP SOLN
1.0000 [drp] | OPHTHALMIC | Status: AC
  Administered 2019-01-12 (×3): 1 [drp] via OPHTHALMIC

## 2019-01-12 MED ORDER — POVIDONE-IODINE 5 % OP SOLN
OPHTHALMIC | Status: DC | PRN
  Administered 2019-01-12: 1 via OPHTHALMIC

## 2019-01-12 MED ORDER — NEOMYCIN-POLYMYXIN-DEXAMETH 3.5-10000-0.1 OP SUSP
OPHTHALMIC | Status: DC | PRN
  Administered 2019-01-12: 1 [drp] via OPHTHALMIC

## 2019-01-12 MED ORDER — EPINEPHRINE PF 1 MG/ML IJ SOLN
INTRAOCULAR | Status: DC | PRN
  Administered 2019-01-12: 500 mL

## 2019-01-12 MED ORDER — PHENYLEPHRINE HCL 2.5 % OP SOLN
1.0000 [drp] | OPHTHALMIC | Status: AC
  Administered 2019-01-12 (×3): 1 [drp] via OPHTHALMIC

## 2019-01-12 MED ORDER — LIDOCAINE HCL 3.5 % OP GEL
1.0000 "application " | Freq: Once | OPHTHALMIC | Status: AC
  Administered 2019-01-12: 1 via OPHTHALMIC

## 2019-01-12 MED ORDER — EPINEPHRINE PF 1 MG/ML IJ SOLN
INTRAMUSCULAR | Status: AC
  Filled 2019-01-12: qty 2

## 2019-01-12 MED ORDER — PROVISC 10 MG/ML IO SOLN
INTRAOCULAR | Status: DC | PRN
  Administered 2019-01-12: 0.85 mL via INTRAOCULAR

## 2019-01-12 MED ORDER — CYCLOPENTOLATE-PHENYLEPHRINE 0.2-1 % OP SOLN
1.0000 [drp] | OPHTHALMIC | Status: AC
  Administered 2019-01-12 (×3): 1 [drp] via OPHTHALMIC

## 2019-01-12 MED ORDER — LIDOCAINE HCL (PF) 1 % IJ SOLN
INTRAOCULAR | Status: DC | PRN
  Administered 2019-01-12: 1 mL via OPHTHALMIC

## 2019-01-12 MED ORDER — SODIUM HYALURONATE 23 MG/ML IO SOLN
INTRAOCULAR | Status: DC | PRN
  Administered 2019-01-12: 0.6 mL via INTRAOCULAR

## 2019-01-12 MED ORDER — BSS IO SOLN
INTRAOCULAR | Status: DC | PRN
  Administered 2019-01-12: 15 mL

## 2019-01-12 SURGICAL SUPPLY — 13 items

## 2019-01-12 NOTE — H&P (Signed)
The H and P was reviewed and updated. The patient was examined.  No changes were found after exam.  The surgical eye was marked.  

## 2019-01-12 NOTE — Op Note (Signed)
Date of procedure: 01/12/19  Pre-operative diagnosis: Visually significant age-related cataract, Right Eye (H25.811)  Post-operative diagnosis: Visually significant age-related cataract, Right Eye  Procedure: Removal of cataract via phacoemulsification and insertion of intra-ocular lens [employer] PCB00  +19.0D into the capsular bag of the Right Eye  Attending surgeon: Gerda Diss. Angas Isabell, MD, MA  Anesthesia: MAC, Topical Akten  Complications: None  Estimated Blood Loss: <44m (minimal)  Specimens: None  Implants: As above  Indications:  Visually significant age-related cataract, Right Eye  Procedure:  The patient was seen and identified in the pre-operative area. The operative eye was identified and dilated.  The operative eye was marked.  Topical anesthesia was administered to the operative eye.     The patient was then to the operative suite and placed in the supine position.  A timeout was performed confirming the patient, procedure to be performed, and all other relevant information.   The patient's face was prepped and draped in the usual fashion for intra-ocular surgery.  A lid speculum was placed into the operative eye and the surgical microscope moved into place and focused.  A superotemporal paracentesis was created using a 20 gauge paracentesis blade.  Shugarcaine was injected into the anterior chamber.  Viscoelastic was injected into the anterior chamber.  A temporal clear-corneal main wound incision was created using a 2.466mmicrokeratome.  A continuous curvilinear capsulorrhexis was initiated using an irrigating cystitome and completed using capsulorrhexis forceps.  Hydrodissection and hydrodeliniation were performed.  Viscoelastic was injected into the anterior chamber.  A phacoemulsification handpiece and a chopper as a second instrument were used to remove the nucleus and epinucleus. The irrigation/aspiration handpiece was used to remove any remaining cortical  material.   The capsular bag was reinflated with viscoelastic, checked, and found to be intact.  The intraocular lens was inserted into the capsular bag and dialed into place using a Kuglen hook.  The irrigation/aspiration handpiece was used to remove any remaining viscoelastic.  The clear corneal wound and paracentesis wounds were then hydrated and checked with Weck-Cels to be watertight.  The lid-speculum and drape was removed, and the patient's face was cleaned with a wet and dry 4x4.  Maxitrol was instilled in the eye before a clear shield was taped over the eye. The patient was taken to the post-operative care unit in good condition, having tolerated the procedure well.  Post-Op Instructions: [facility] and will receive all other orders and instructions.

## 2019-01-12 NOTE — Anesthesia Preprocedure Evaluation (Signed)
Anesthesia Evaluation  Patient identified by MRN, date of birth, ID band Patient awake    Reviewed: Allergy & Precautions, H&P , NPO status , Patient's Chart, lab work & pertinent test results, reviewed documented beta blocker date and time   Airway Mallampati: II       Dental no notable dental hx.    Pulmonary COPD, former smoker,    Pulmonary exam normal        Cardiovascular hypertension, Normal cardiovascular exam     Neuro/Psych PSYCHIATRIC DISORDERS Depression    GI/Hepatic   Endo/Other  diabetes, Poorly Controlled, Type 2, Insulin Dependent  Renal/GU CRF     Musculoskeletal   Abdominal   Peds  Hematology  (+) Blood dyscrasia, anemia ,   Anesthesia Other Findings Surgeon agrees to proceed with very high blood glucose.  As we are not anticipating giving any sedation, our involvement is not contraindicated.  Patient advised to check and treat blood glucose level when she returns home.  Reproductive/Obstetrics                             Anesthesia Physical Anesthesia Plan  ASA: III  Anesthesia Plan: MAC   Post-op Pain Management:    Induction:   PONV Risk Score and Plan: 2 and TIVA  Airway Management Planned:   Additional Equipment:   Intra-op Plan:   Post-operative Plan:   Informed Consent:   Plan Discussed with: CRNA and Surgeon  Anesthesia Plan Comments:         Anesthesia Quick Evaluation

## 2019-01-12 NOTE — Discharge Instructions (Signed)
Please discharge patient when stable, will follow up today with Dr. Kaesha Kirsch at the [facility] office immediately following discharge.  Leave shield in place until visit.  All paperwork with discharge instructions will be given at the office. ° °

## 2019-01-12 NOTE — Anesthesia Postprocedure Evaluation (Signed)
Anesthesia Post Note  Patient: Kelli Summers  Procedure(s) Performed: CATARACT EXTRACTION PHACO AND INTRAOCULAR LENS PLACEMENT RIGHT EYE (Right Eye)  Patient location during evaluation: PACU Anesthesia Type: MAC Level of consciousness: awake and alert Pain management: pain level controlled Vital Signs Assessment: post-procedure vital signs reviewed and stable Respiratory status: spontaneous breathing, nonlabored ventilation, respiratory function stable and patient connected to nasal cannula oxygen Cardiovascular status: stable and blood pressure returned to baseline Postop Assessment: no apparent nausea or vomiting Anesthetic complications: no     Last Vitals:  Vitals:   01/12/19 0745 01/12/19 0929  BP: (!) 146/80 (!) 142/87  Pulse: 81 86  Resp: 18 18  Temp: 36.6 C 36.5 C  SpO2: 96% 97%    Last Pain:  Vitals:   01/12/19 0929  TempSrc: Oral  PainSc: 0-No pain                 Ensley Blas

## 2019-01-12 NOTE — Transfer of Care (Signed)
Immediate Anesthesia Transfer of Care Note  Patient: Kelli Summers  Procedure(s) Performed: CATARACT EXTRACTION PHACO AND INTRAOCULAR LENS PLACEMENT RIGHT EYE (Right Eye)  Patient Location: PACU  Anesthesia Type:MAC  Level of Consciousness: awake, alert  and oriented  Airway & Oxygen Therapy: Patient Spontanous Breathing  Post-op Assessment: Report given to RN and Post -op Vital signs reviewed and stable  Post vital signs: Reviewed and stable  Last Vitals:  Vitals Value Taken Time  BP    Temp    Pulse    Resp    SpO2      Last Pain:  Vitals:   01/12/19 0745  PainSc: 0-No pain         Complications: No apparent anesthesia complications

## 2019-01-15 ENCOUNTER — Encounter (HOSPITAL_COMMUNITY): Payer: Self-pay | Admitting: Ophthalmology

## 2019-01-19 DIAGNOSIS — H25812 Combined forms of age-related cataract, left eye: Secondary | ICD-10-CM | POA: Diagnosis not present

## 2019-01-24 ENCOUNTER — Other Ambulatory Visit: Payer: Self-pay

## 2019-01-24 ENCOUNTER — Encounter (HOSPITAL_COMMUNITY)
Admission: RE | Admit: 2019-01-24 | Discharge: 2019-01-24 | Disposition: A | Discharge status: Home / Self Care | Payer: BC Managed Care – PPO | Source: Ambulatory Visit | Attending: Ophthalmology | Admitting: Ophthalmology

## 2019-01-24 ENCOUNTER — Other Ambulatory Visit (HOSPITAL_COMMUNITY)
Admission: RE | Admit: 2019-01-24 | Discharge: 2019-01-24 | Disposition: A | Discharge status: Home / Self Care | Payer: BC Managed Care – PPO | Source: Ambulatory Visit | Attending: Ophthalmology | Admitting: Ophthalmology

## 2019-01-24 DIAGNOSIS — Z20828 Contact with and (suspected) exposure to other viral communicable diseases: Secondary | ICD-10-CM | POA: Insufficient documentation

## 2019-01-24 LAB — SARS CORONAVIRUS 2 (TAT 6-24 HRS): SARS Coronavirus 2: NEGATIVE

## 2019-01-26 ENCOUNTER — Encounter (HOSPITAL_COMMUNITY)
Admission: RE | Disposition: A | Discharge status: Home / Self Care | Payer: Self-pay | Source: Home / Self Care | Attending: Ophthalmology

## 2019-01-26 ENCOUNTER — Ambulatory Visit (HOSPITAL_COMMUNITY): Payer: BC Managed Care – PPO | Admitting: Anesthesiology

## 2019-01-26 ENCOUNTER — Encounter (HOSPITAL_COMMUNITY): Payer: Self-pay | Admitting: *Deleted

## 2019-01-26 ENCOUNTER — Other Ambulatory Visit: Payer: Self-pay

## 2019-01-26 ENCOUNTER — Ambulatory Visit (HOSPITAL_COMMUNITY)
Admission: RE | Admit: 2019-01-26 | Discharge: 2019-01-26 | Disposition: A | Discharge status: Home / Self Care | Payer: BC Managed Care – PPO | Attending: Ophthalmology | Admitting: Ophthalmology

## 2019-01-26 DIAGNOSIS — I10 Essential (primary) hypertension: Secondary | ICD-10-CM | POA: Insufficient documentation

## 2019-01-26 DIAGNOSIS — Z87891 Personal history of nicotine dependence: Secondary | ICD-10-CM | POA: Insufficient documentation

## 2019-01-26 DIAGNOSIS — E1136 Type 2 diabetes mellitus with diabetic cataract: Secondary | ICD-10-CM | POA: Insufficient documentation

## 2019-01-26 DIAGNOSIS — J449 Chronic obstructive pulmonary disease, unspecified: Secondary | ICD-10-CM | POA: Diagnosis not present

## 2019-01-26 DIAGNOSIS — Z794 Long term (current) use of insulin: Secondary | ICD-10-CM | POA: Diagnosis not present

## 2019-01-26 DIAGNOSIS — H25812 Combined forms of age-related cataract, left eye: Secondary | ICD-10-CM | POA: Diagnosis not present

## 2019-01-26 DIAGNOSIS — E1165 Type 2 diabetes mellitus with hyperglycemia: Secondary | ICD-10-CM | POA: Insufficient documentation

## 2019-01-26 DIAGNOSIS — Z902 Acquired absence of lung [part of]: Secondary | ICD-10-CM | POA: Diagnosis not present

## 2019-01-26 DIAGNOSIS — H259 Unspecified age-related cataract: Secondary | ICD-10-CM | POA: Insufficient documentation

## 2019-01-26 DIAGNOSIS — H2512 Age-related nuclear cataract, left eye: Secondary | ICD-10-CM | POA: Diagnosis not present

## 2019-01-26 HISTORY — PX: CATARACT EXTRACTION W/PHACO: SHX586

## 2019-01-26 LAB — GLUCOSE, CAPILLARY
Glucose-Capillary: 392 mg/dL — ABNORMAL HIGH (ref 70–99)
Glucose-Capillary: 382 mg/dL — ABNORMAL HIGH (ref 70–99)

## 2019-01-26 SURGERY — PHACOEMULSIFICATION, CATARACT, WITH IOL INSERTION
Anesthesia: Monitor Anesthesia Care | Site: Eye | Laterality: Left

## 2019-01-26 MED ORDER — INSULIN ASPART 100 UNIT/ML ~~LOC~~ SOLN
5.0000 [IU] | Freq: Once | SUBCUTANEOUS | Status: AC
  Administered 2019-01-26: 5 [IU] via SUBCUTANEOUS
  Filled 2019-01-26: qty 0.05

## 2019-01-26 MED ORDER — EPINEPHRINE PF 1 MG/ML IJ SOLN
INTRAOCULAR | Status: DC | PRN
  Administered 2019-01-26: 500 mL

## 2019-01-26 MED ORDER — CYCLOPENTOLATE-PHENYLEPHRINE 0.2-1 % OP SOLN
1.0000 [drp] | OPHTHALMIC | Status: AC
  Administered 2019-01-26 (×3): 1 [drp] via OPHTHALMIC

## 2019-01-26 MED ORDER — NEOMYCIN-POLYMYXIN-DEXAMETH 3.5-10000-0.1 OP SUSP
OPHTHALMIC | Status: DC | PRN
  Administered 2019-01-26: 2 [drp] via OPHTHALMIC

## 2019-01-26 MED ORDER — LIDOCAINE HCL (PF) 1 % IJ SOLN
INTRAOCULAR | Status: DC | PRN
  Administered 2019-01-26: 1 mL via OPHTHALMIC

## 2019-01-26 MED ORDER — PHENYLEPHRINE HCL 2.5 % OP SOLN
1.0000 [drp] | OPHTHALMIC | Status: AC
  Administered 2019-01-26 (×3): 1 [drp] via OPHTHALMIC

## 2019-01-26 MED ORDER — SODIUM HYALURONATE 23 MG/ML IO SOLN
INTRAOCULAR | Status: DC | PRN
  Administered 2019-01-26: 0.6 mL via INTRAOCULAR

## 2019-01-26 MED ORDER — PROVISC 10 MG/ML IO SOLN
INTRAOCULAR | Status: DC | PRN
  Administered 2019-01-26: 0.85 mL via INTRAOCULAR

## 2019-01-26 MED ORDER — POVIDONE-IODINE 5 % OP SOLN
OPHTHALMIC | Status: DC | PRN
  Administered 2019-01-26: 1 via OPHTHALMIC

## 2019-01-26 MED ORDER — INSULIN ASPART 100 UNIT/ML ~~LOC~~ SOLN
5.0000 [IU] | Freq: Once | SUBCUTANEOUS | Status: AC
  Administered 2019-01-26: 5 [IU] via SUBCUTANEOUS

## 2019-01-26 MED ORDER — LIDOCAINE HCL 3.5 % OP GEL: 1.0000 "application " | Freq: Once | OPHTHALMIC | Status: DC

## 2019-01-26 MED ORDER — BSS IO SOLN
INTRAOCULAR | Status: DC | PRN
  Administered 2019-01-26: 15 mL

## 2019-01-26 MED ORDER — LACTATED RINGERS IV SOLN: INTRAVENOUS | Status: DC

## 2019-01-26 MED ORDER — TETRACAINE HCL 0.5 % OP SOLN
1.0000 [drp] | OPHTHALMIC | Status: AC
  Administered 2019-01-26 (×3): 1 [drp] via OPHTHALMIC

## 2019-01-26 SURGICAL SUPPLY — 13 items

## 2019-01-26 NOTE — Anesthesia Preprocedure Evaluation (Addendum)
Anesthesia Evaluation  Patient identified by MRN, date of birth, ID band Patient awake    Reviewed: Allergy & Precautions, NPO status , Patient's Chart, lab work & pertinent test results  Airway Mallampati: II  TM Distance: >3 FB Neck ROM: Full    Dental no notable dental hx.    Pulmonary COPD,  COPD inhaler, former smoker,  S/p prior lung resection Denies o2 use  Reports asthma -uses OTC -primatene   Pulmonary exam normal breath sounds clear to auscultation       Cardiovascular Exercise Tolerance: Good hypertension, Pt. on medications and Pt. on home beta blockers negative cardio ROS Normal cardiovascular examI Rhythm:Regular Rate:Normal     Neuro/Psych Depression negative neurological ROS  negative psych ROS   GI/Hepatic negative GI ROS, Neg liver ROS,   Endo/Other  negative endocrine ROSdiabetes, Poorly Controlled, Type 2, Oral Hypoglycemic Agents, Insulin Dependent  Renal/GU Renal InsufficiencyRenal disease  negative genitourinary   Musculoskeletal  (+) Arthritis , Osteoarthritis,    Abdominal   Peds negative pediatric ROS (+)  Hematology negative hematology ROS (+)   Anesthesia Other Findings   Reproductive/Obstetrics negative OB ROS                            Anesthesia Physical Anesthesia Plan  ASA: III  Anesthesia Plan: MAC   Post-op Pain Management:    Induction: Intravenous  PONV Risk Score and Plan: 3 and TIVA and Treatment may vary due to age or medical condition  Airway Management Planned: Nasal Cannula and Simple Face Mask  Additional Equipment:   Intra-op Plan:   Post-operative Plan:   Informed Consent: I have reviewed the patients History and Physical, chart, labs and discussed the procedure including the risks, benefits and alternatives for the proposed anesthesia with the patient or authorized representative who has indicated his/her understanding and  acceptance.     Dental advisory given  Plan Discussed with: CRNA  Anesthesia Plan Comments: (Plan MAC , states she did well last time ,  Plan Full PPE use )        Anesthesia Quick Evaluation

## 2019-01-26 NOTE — Anesthesia Procedure Notes (Signed)
Procedure Name: MAC Performed by: Adams, Amy A, CRNA Pre-anesthesia Checklist: Patient identified, Emergency Drugs available, Suction available, Timeout performed and Patient being monitored Patient Re-evaluated:Patient Re-evaluated prior to induction Oxygen Delivery Method: Nasal Cannula       

## 2019-01-26 NOTE — Discharge Instructions (Signed)
Please discharge patient when stable, will follow up today with Dr. Pennye Beeghly at the [facility] office immediately following discharge.  Leave shield in place until visit.  All paperwork with discharge instructions will be given at the office. ° °

## 2019-01-26 NOTE — Op Note (Signed)
Date of procedure: 01/26/19  Pre-operative diagnosis: Visually significant age-related cataract, Left Eye (H25.812)  Post-operative diagnosis: Visually significant age-related cataract, Left Eye  Procedure: Removal of cataract via phacoemulsification and insertion of intra-ocular lens [employer]  +18.0D into the capsular bag of the Left Eye  Attending surgeon: Gerda Diss. Nadiah Corbit, MD, MA  Anesthesia: MAC, Topical Akten  Complications: None  Estimated Blood Loss: <40m (minimal)  Specimens: None  Implants: As above  Indications:  Visually significant age-related cataract, Left Eye  Procedure:  The patient was seen and identified in the pre-operative area. The operative eye was identified and dilated.  The operative eye was marked.  Topical anesthesia was administered to the operative eye.     The patient was then to the operative suite and placed in the supine position.  A timeout was performed confirming the patient, procedure to be performed, and all other relevant information.   The patient's face was prepped and draped in the usual fashion for intra-ocular surgery.  A lid speculum was placed into the operative eye and the surgical microscope moved into place and focused.  An inferotemporal paracentesis was created using a 20 gauge paracentesis blade.  Shugarcaine was injected into the anterior chamber.  Viscoelastic was injected into the anterior chamber.  A temporal clear-corneal main wound incision was created using a 2.428mmicrokeratome.  A continuous curvilinear capsulorrhexis was initiated using an irrigating cystitome and completed using capsulorrhexis forceps.  Hydrodissection and hydrodeliniation were performed.  Viscoelastic was injected into the anterior chamber.  A phacoemulsification handpiece and a chopper as a second instrument were used to remove the nucleus and epinucleus. The irrigation/aspiration handpiece was used to remove any remaining cortical  material.   The capsular bag was reinflated with viscoelastic, checked, and found to be intact.  The intraocular lens was inserted into the capsular bag and dialed into place using a Kuglen hook.  The irrigation/aspiration handpiece was used to remove any remaining viscoelastic.  The clear corneal wound and paracentesis wounds were then hydrated and checked with Weck-Cels to be watertight.  The lid-speculum and drape was removed, and the patient's face was cleaned with a wet and dry 4x4.  Maxitrol was instilled in the eye before a clear shield was taped over the eye. The patient was taken to the post-operative care unit in good condition, having tolerated the procedure well.  Post-Op Instructions: [facility] and will receive all other orders and instructions.

## 2019-01-26 NOTE — H&P (Signed)
The H and P was reviewed and updated. The patient was examined.  No changes were found after exam.  The surgical eye was marked.  

## 2019-01-26 NOTE — Anesthesia Postprocedure Evaluation (Signed)
Anesthesia Post Note  Patient: Kelli Summers  Procedure(s) Performed: CATARACT EXTRACTION PHACO AND INTRAOCULAR LENS PLACEMENT (IOC) (Left Eye)  Patient location during evaluation: Short Stay Anesthesia Type: MAC Level of consciousness: awake and alert, oriented and patient cooperative Pain management: pain level controlled Vital Signs Assessment: post-procedure vital signs reviewed and stable Respiratory status: spontaneous breathing Cardiovascular status: stable Postop Assessment: no apparent nausea or vomiting Anesthetic complications: no     Last Vitals:  Vitals:   01/26/19 0918  BP: (!) 143/78  Resp: 20  Temp: 36.7 C  SpO2: 97%    Last Pain:  Vitals:   01/26/19 0918  TempSrc: Oral  PainSc: 0-No pain                 ADAMS, AMY A

## 2019-01-26 NOTE — Transfer of Care (Signed)
Immediate Anesthesia Transfer of Care Note  Patient: Kelli Summers  Procedure(s) Performed: CATARACT EXTRACTION PHACO AND INTRAOCULAR LENS PLACEMENT (IOC) (Left Eye)  Patient Location: Short Stay  Anesthesia Type:MAC  Level of Consciousness: awake, alert , oriented and patient cooperative  Airway & Oxygen Therapy: Patient Spontanous Breathing  Post-op Assessment: Report given to RN and Post -op Vital signs reviewed and stable  Post vital signs: Reviewed and stable  Last Vitals:  Vitals Value Taken Time  BP    Temp    Pulse    Resp    SpO2      Last Pain:  Vitals:   01/26/19 0918  TempSrc: Oral  PainSc: 0-No pain         Complications: No apparent anesthesia complications

## 2019-01-29 ENCOUNTER — Encounter (HOSPITAL_COMMUNITY): Payer: Self-pay | Admitting: Ophthalmology

## 2019-02-05 ENCOUNTER — Other Ambulatory Visit (HOSPITAL_COMMUNITY): Payer: Self-pay | Admitting: Family Medicine

## 2019-02-05 DIAGNOSIS — Z1231 Encounter for screening mammogram for malignant neoplasm of breast: Secondary | ICD-10-CM

## 2019-05-14 DIAGNOSIS — J22 Unspecified acute lower respiratory infection: Secondary | ICD-10-CM | POA: Diagnosis not present

## 2019-09-14 DIAGNOSIS — E119 Type 2 diabetes mellitus without complications: Secondary | ICD-10-CM | POA: Diagnosis not present

## 2019-09-14 DIAGNOSIS — Z6835 Body mass index (BMI) 35.0-35.9, adult: Secondary | ICD-10-CM | POA: Diagnosis not present

## 2019-09-14 DIAGNOSIS — E6609 Other obesity due to excess calories: Secondary | ICD-10-CM | POA: Diagnosis not present

## 2020-02-28 ENCOUNTER — Other Ambulatory Visit (HOSPITAL_COMMUNITY): Payer: Self-pay | Admitting: Family Medicine

## 2020-02-28 DIAGNOSIS — Z1231 Encounter for screening mammogram for malignant neoplasm of breast: Secondary | ICD-10-CM

## 2020-02-29 ENCOUNTER — Other Ambulatory Visit (HOSPITAL_COMMUNITY): Payer: Self-pay | Admitting: Family Medicine

## 2020-02-29 ENCOUNTER — Other Ambulatory Visit: Payer: Self-pay | Admitting: Family Medicine

## 2020-02-29 DIAGNOSIS — I10 Essential (primary) hypertension: Secondary | ICD-10-CM

## 2020-03-06 ENCOUNTER — Ambulatory Visit (HOSPITAL_COMMUNITY)
Admission: RE | Admit: 2020-03-06 | Discharge: 2020-03-06 | Disposition: A | Discharge status: Home / Self Care | Payer: 59 | Source: Ambulatory Visit | Attending: Family Medicine | Admitting: Family Medicine

## 2020-03-06 ENCOUNTER — Other Ambulatory Visit: Payer: Self-pay

## 2020-03-06 DIAGNOSIS — Z1231 Encounter for screening mammogram for malignant neoplasm of breast: Secondary | ICD-10-CM | POA: Diagnosis present

## 2020-03-06 DIAGNOSIS — I10 Essential (primary) hypertension: Secondary | ICD-10-CM | POA: Insufficient documentation

## 2020-03-19 ENCOUNTER — Other Ambulatory Visit: Payer: Self-pay

## 2020-03-19 DIAGNOSIS — I6529 Occlusion and stenosis of unspecified carotid artery: Secondary | ICD-10-CM

## 2020-03-27 ENCOUNTER — Ambulatory Visit: Payer: 59 | Attending: Internal Medicine

## 2020-03-27 DIAGNOSIS — Z23 Encounter for immunization: Secondary | ICD-10-CM

## 2020-03-27 NOTE — Progress Notes (Signed)
   Covid-19 Vaccination Clinic  Name:  Kelli Summers    MRN: [id number] DOB: [birth year]  03/27/2020  Kelli Summers was observed post Covid-19 immunization for 15 minutes without incident. She was provided with Vaccine Information Sheet and instruction to access the V-Safe system.   Kelli Summers was instructed to call 911 with any severe reactions post vaccine: Marland Kitchen Difficulty breathing  . Swelling of face and throat  . A fast heartbeat  . A bad rash all over body  . Dizziness and weakness   Immunizations Administered    Name Date Dose VIS Date Route   Pfizer COVID-19 Vaccine 03/27/2020 10:18 AM 0.3 mL 08/22/2018 Intramuscular   Manufacturer: [employer], [employer]   Lot: [id number]   NDC: [id number]

## 2020-04-04 ENCOUNTER — Other Ambulatory Visit: Payer: Self-pay

## 2020-04-04 ENCOUNTER — Ambulatory Visit: Payer: 59 | Admitting: Vascular Surgery

## 2020-04-04 ENCOUNTER — Ambulatory Visit (HOSPITAL_COMMUNITY)
Admission: RE | Admit: 2020-04-04 | Discharge: 2020-04-04 | Disposition: A | Discharge status: Home / Self Care | Payer: 59 | Source: Ambulatory Visit | Attending: Vascular Surgery | Admitting: Vascular Surgery

## 2020-04-04 ENCOUNTER — Encounter: Payer: Self-pay | Admitting: Vascular Surgery

## 2020-04-04 VITALS — BP 139/84 | HR 78 | Temp 98.1°F | Resp 20 | Ht 64.0 in | Wt 205.0 lb

## 2020-04-04 DIAGNOSIS — I70219 Atherosclerosis of native arteries of extremities with intermittent claudication, unspecified extremity: Secondary | ICD-10-CM | POA: Diagnosis not present

## 2020-04-04 DIAGNOSIS — I70213 Atherosclerosis of native arteries of extremities with intermittent claudication, bilateral legs: Secondary | ICD-10-CM

## 2020-04-04 DIAGNOSIS — I6529 Occlusion and stenosis of unspecified carotid artery: Secondary | ICD-10-CM | POA: Diagnosis present

## 2020-04-04 NOTE — Progress Notes (Signed)
Patient ID: Kelli Summers, female   DOB: 12-03-1955, 64 y.o.   MRN: [id number]  Reason for Consult: Follow-up   Referred by Assunta Found, MD  Subjective:     HPI:  Kelli Summers is a 64 y.o. female followed with history of carotid stenosis.  She has ongoing smoking history as well as diabetes and hypertension as risk factors.  She has never had any vascular intervention.  Denies heart history.  Denies any history of stroke TIA or amaurosis.  She does not take any antiplatelet medicines at this time.  She has very short distance claudication.  States that she can only walk to our lobby.  She does not have tissue loss or ulceration.  She denies rest pain.  States that she does have family history as well with her father having previous left lower extremity stenting for what sounds like osteomyelitis requiring antibiotics.  Past Medical History:  Diagnosis Date  . Arthritis   . Chronic back pain   . Chronic knee pain   . Depression   . Diabetes mellitus   . Ejection fraction   . History of stress test 01/2010   normal myoview stress test  . Hypertension   . Sinus tachycardia    Family History  Problem Relation Age of Onset  . Emphysema Maternal Grandfather        smoked  . Breast cancer Maternal Aunt    Past Surgical History:  Procedure Laterality Date  . ABDOMINAL HYSTERECTOMY    . BACK SURGERY    . CATARACT EXTRACTION W/PHACO Right 01/12/2019   Procedure: CATARACT EXTRACTION PHACO AND INTRAOCULAR LENS PLACEMENT RIGHT EYE;  Surgeon: Fabio Pierce, MD;  Location: [facility];  Service: Ophthalmology;  Laterality: Right;  right  . CATARACT EXTRACTION W/PHACO Left 01/26/2019   Procedure: CATARACT EXTRACTION PHACO AND INTRAOCULAR LENS PLACEMENT (IOC);  Surgeon: Fabio Pierce, MD;  Location: [facility];  Service: Ophthalmology;  Laterality: Left;  CDE: 7.07  . CHOLECYSTECTOMY    . COLONOSCOPY N/A 04/15/2015   Procedure: COLONOSCOPY;  Surgeon: Franky Macho, MD;  Location: [facility];   Service: Gastroenterology;  Laterality: N/A;  . CYST EXCISION Left 04/02/2016   Procedure: EXCISION 1 CM CYST ON LEFT SIDE OF FACE;  Surgeon: Franky Macho, MD;  Location: [facility];  Service: General;  Laterality: Left;  Marland Kitchen VIDEO ASSISTED THORACOSCOPY (VATS)/DECORTICATION Right 05/09/2013   Procedure: VIDEO ASSISTED THORACOSCOPY (VATS)/DECORTICATION;  Surgeon: Loreli Slot, MD;  Location: [facility] OR;  Service: Thoracic;  Laterality: Right;  . WEDGE RESECTION Right 05/09/2013   Procedure: WEDGE RESECTION RIGHT LOWER LOBE;  Surgeon: Loreli Slot, MD;  Location: [facility] OR;  Service: Thoracic;  Laterality: Right;    Short Social History:  Social History   Tobacco Use  . Smoking status: Former Smoker    Packs/day: 0.50    Years: 40.00    Pack years: 20.00    Types: Cigarettes    Quit date: 04/28/2012    Years since quitting: 7.9  . Smokeless tobacco: Former Engineer, water Use Topics  . Alcohol use: No    Allergies  Allergen Reactions  . Penicillins Hives, Shortness Of Breath and Itching    Tolerates Ceftin Has patient had a PCN reaction causing immediate rash, facial/tongue/throat swelling, SOB or lightheadedness with hypotension: Yes Has patient had a PCN reaction causing severe rash involving mucus membranes or skin necrosis: No Has patient had a PCN reaction that required hospitalization No Has patient had a PCN reaction  occurring within the last 10 years: No If all of the above answers are "NO", then may proceed with Cephalosporin use.   . Ciprofloxacin Hives and Itching  . Hydrocodone-Acetaminophen Itching    Pt can take acetaminophen  . Percocet [Oxycodone-Acetaminophen] Itching  . Biotin Itching and Rash    Current Outpatient Medications  Medication Sig Dispense Refill  . albuterol (VENTOLIN HFA) 108 (90 Base) MCG/ACT inhaler     . EPINEPHrine (PRIMATENE MIST IN) Inhale 2 puffs into the lungs 3 (three) times daily as needed (shortness of breath).    Marland Kitchen FARXIGA 10 MG  TABS tablet Take 10 mg by mouth daily.    . fluticasone (FLONASE) 50 MCG/ACT nasal spray Place into both nostrils.    . furosemide (LASIX) 20 MG tablet Take 20 mg by mouth daily.    Marland Kitchen glipiZIDE (GLUCOTROL) 10 MG tablet Take 10 mg by mouth 2 (two) times daily.    . Insulin Glargine-Lixisenatide (SOLIQUA [place]) Inject 22 Units into the skin at bedtime.    Marland Kitchen losartan (COZAAR) 100 MG tablet Take 100 mg by mouth daily.  0  . meloxicam (MOBIC) 15 MG tablet Take 15 mg by mouth daily.    . metFORMIN (GLUCOPHAGE) 1000 MG tablet Take 1,000 mg by mouth daily with breakfast.    . metoprolol tartrate (LOPRESSOR) 25 MG tablet Take 25 mg by mouth 2 (two) times daily.     No current facility-administered medications for this visit.    Review of Systems  Constitutional:  Constitutional negative. HENT: HENT negative.  Eyes: Eyes negative.  Respiratory: Respiratory negative.  Cardiovascular: Positive for claudication.  GI: Gastrointestinal negative.  Musculoskeletal: Positive for gait problem and joint pain.  Hematologic: Hematologic/lymphatic negative.  Psychiatric: Psychiatric negative.        Objective:  Objective   Vitals:   04/04/20 0837  BP: 139/84  Pulse: 78  Resp: 20  Temp: 98.1 F (36.7 C)  SpO2: 96%  Weight: 205 lb (93 kg)  Height: 5\' 4"  (1.626 m)   Body mass index is 35.19 kg/m.  Physical Exam HENT:     Head: Normocephalic.     Nose:     Comments: Wearing a mask Eyes:     Pupils: Pupils are equal, round, and reactive to light.  Neck:     Vascular: No carotid bruit.  Cardiovascular:     Rate and Rhythm: Normal rate.     Pulses:          Radial pulses are 2+ on the right side and 2+ on the left side.       Femoral pulses are 2+ on the right side and 2+ on the left side.      Popliteal pulses are 0 on the right side and 0 on the left side.       Dorsalis pedis pulses are 0 on the right side and 0 on the left side.       Posterior tibial pulses are 0 on the right side and  0 on the left side.     Heart sounds: Normal heart sounds.  Pulmonary:     Effort: Pulmonary effort is normal.     Breath sounds: Normal breath sounds.  Abdominal:     General: Abdomen is flat.     Palpations: Abdomen is soft. There is no mass.  Musculoskeletal:        General: Normal range of motion.  Skin:    General: Skin is warm and dry.  Capillary Refill: Capillary refill takes less than 2 seconds.  Neurological:     Mental Status: She is alert.  Psychiatric:        Mood and Affect: Mood normal.        Behavior: Behavior normal.        Thought Content: Thought content normal.        Judgment: Judgment normal.     Data:  I have independently interpreted her carotid studies to be 40 to 59% stenosis on the right heterogeneous and calcific and on the left 1 to 39% stenosis.  Bilateral vertebral arteries demonstrate antegrade flow.  FINDINGS: Right ABI:  0.84  Left ABI:  0.81  Right Lower Extremity: Segmental Doppler at the right ankle demonstrates monophasic waveforms.  Left Lower Extremity: Segmental Doppler at the left ankle demonstrates monophasic waveforms.  IMPRESSION: Resting ABI the bilateral lower extremities in the mild range arterial occlusive disease. The values would likely drop after exercise exam.  Segmental Doppler performed at the bilateral ankles demonstrates monophasic waveforms indicating more proximal occlusive arterial disease.      Assessment/Plan:     64 year old female with history of carotid stenosis.  Stenosis appears stable she can follow-up in 1 year from the standpoint.  I have asked her to begin taking baby aspirin daily.  She also appears to have very short distance claudication.  ABIs recently was 0.8 I do not palpate any popliteal arteries.  She does not have any tissue loss or ulceration.  She thinks that she also has sciatica and has symptoms consistent with this.  I will have her follow-up in 3 to 4 months with lower  extremity duplex bilaterally as well as ABIs.  We will see her for her carotid arteries in 1 year.     Maeola Harman MD [facility]

## 2020-04-08 ENCOUNTER — Other Ambulatory Visit: Payer: Self-pay | Admitting: *Deleted

## 2020-04-08 DIAGNOSIS — I70219 Atherosclerosis of native arteries of extremities with intermittent claudication, unspecified extremity: Secondary | ICD-10-CM

## 2020-04-08 DIAGNOSIS — I70213 Atherosclerosis of native arteries of extremities with intermittent claudication, bilateral legs: Secondary | ICD-10-CM

## 2020-04-17 ENCOUNTER — Ambulatory Visit: Payer: 59 | Attending: Internal Medicine

## 2020-04-17 DIAGNOSIS — Z23 Encounter for immunization: Secondary | ICD-10-CM

## 2020-04-17 NOTE — Progress Notes (Signed)
   Covid-19 Vaccination Clinic  Name:  Kelli Summers    MRN: [id number] DOB: 20-Dec-1955  04/17/2020  [name] was observed post Covid-19 immunization for 15 minutes without incident. She was provided with Vaccine Information Sheet and instruction to access the V-Safe system.   [name] was instructed to call 911 with any severe reactions post vaccine: Marland Kitchen Difficulty breathing  . Swelling of face and throat  . A fast heartbeat  . A bad rash all over body  . Dizziness and weakness   Immunizations Administered    Name Date Dose VIS Date Route   Pfizer COVID-19 Vaccine 04/17/2020  9:44 AM 0.3 mL 08/22/2018 Intramuscular   Manufacturer: [employer], [employer]   Lot: [id number]   NDC: [address]-0

## 2020-05-01 ENCOUNTER — Other Ambulatory Visit: Payer: 59

## 2020-07-04 ENCOUNTER — Encounter (HOSPITAL_COMMUNITY): Payer: 59

## 2020-07-04 ENCOUNTER — Ambulatory Visit: Payer: 59 | Admitting: Vascular Surgery

## 2020-07-04 ENCOUNTER — Inpatient Hospital Stay (HOSPITAL_COMMUNITY): Admission: RE | Admit: 2020-07-04 | Payer: 59 | Source: Ambulatory Visit

## 2020-09-04 DIAGNOSIS — J069 Acute upper respiratory infection, unspecified: Secondary | ICD-10-CM | POA: Diagnosis not present

## 2020-09-05 ENCOUNTER — Encounter: Payer: Self-pay | Admitting: Vascular Surgery

## 2020-09-05 ENCOUNTER — Ambulatory Visit (INDEPENDENT_AMBULATORY_CARE_PROVIDER_SITE_OTHER)
Admission: RE | Admit: 2020-09-05 | Discharge: 2020-09-05 | Disposition: A | Discharge status: Home / Self Care | Payer: 59 | Source: Ambulatory Visit | Attending: Vascular Surgery | Admitting: Vascular Surgery

## 2020-09-05 ENCOUNTER — Ambulatory Visit: Payer: 59 | Admitting: Vascular Surgery

## 2020-09-05 ENCOUNTER — Ambulatory Visit (INDEPENDENT_AMBULATORY_CARE_PROVIDER_SITE_OTHER): Payer: Medicare HMO | Admitting: Vascular Surgery

## 2020-09-05 ENCOUNTER — Encounter (HOSPITAL_COMMUNITY): Payer: 59

## 2020-09-05 ENCOUNTER — Other Ambulatory Visit: Payer: Self-pay

## 2020-09-05 ENCOUNTER — Ambulatory Visit (HOSPITAL_COMMUNITY)
Admission: RE | Admit: 2020-09-05 | Discharge: 2020-09-05 | Disposition: A | Discharge status: Home / Self Care | Payer: 59 | Source: Ambulatory Visit | Attending: Vascular Surgery | Admitting: Vascular Surgery

## 2020-09-05 VITALS — BP 101/65 | HR 95 | Temp 98.1°F | Resp 20 | Ht 64.0 in | Wt 202.0 lb

## 2020-09-05 DIAGNOSIS — I70213 Atherosclerosis of native arteries of extremities with intermittent claudication, bilateral legs: Secondary | ICD-10-CM | POA: Diagnosis not present

## 2020-09-05 DIAGNOSIS — I70219 Atherosclerosis of native arteries of extremities with intermittent claudication, unspecified extremity: Secondary | ICD-10-CM | POA: Diagnosis not present

## 2020-09-05 NOTE — Progress Notes (Signed)
Patient ID: Kelli Summers, female   DOB: May 08, 1956, 65 y.o.   MRN: [id number]  Reason for Consult: Follow-up   Referred by Assunta Found, MD  Subjective:     HPI:  Kelli Summers is a 65 y.o. female history of carotid artery stenosis.  She is now a former smoker having quit January 1.  She has diabetes and hypertension is ongoing risk factors for vascular disease.  She denies any previous history of vascular intervention.  He does not have any history of stroke TIA or amaurosis.  She does have very short distance claudication.  She can walk some days as much as she wants but other days very short distance only 50 feet.  Her father did have stenting of his SFA for what appears to be osteomyelitis she has never had similar.  She denies any tissue loss or ulceration.  She does not have rest pain.  At last visit plan was to begin taking baby aspirin follow-up with ABIs.  She does have nausea with daily aspirin.  Past Medical History:  Diagnosis Date  . Arthritis   . Chronic back pain   . Chronic knee pain   . Depression   . Diabetes mellitus   . Ejection fraction   . History of stress test 01/2010   normal myoview stress test  . Hypertension   . Sinus tachycardia    Family History  Problem Relation Age of Onset  . Emphysema Maternal Grandfather        smoked  . Breast cancer Maternal Aunt    Past Surgical History:  Procedure Laterality Date  . ABDOMINAL HYSTERECTOMY    . BACK SURGERY    . CATARACT EXTRACTION W/PHACO Right 01/12/2019   Procedure: CATARACT EXTRACTION PHACO AND INTRAOCULAR LENS PLACEMENT RIGHT EYE;  Surgeon: Fabio Pierce, MD;  Location: [facility];  Service: Ophthalmology;  Laterality: Right;  right  . CATARACT EXTRACTION W/PHACO Left 01/26/2019   Procedure: CATARACT EXTRACTION PHACO AND INTRAOCULAR LENS PLACEMENT (IOC);  Surgeon: Fabio Pierce, MD;  Location: [facility];  Service: Ophthalmology;  Laterality: Left;  CDE: 7.07  . CHOLECYSTECTOMY    . COLONOSCOPY N/A  04/15/2015   Procedure: COLONOSCOPY;  Surgeon: Franky Macho, MD;  [facility]: [facility];  Service: Gastroenterology;  Laterality: N/A;  . CYST EXCISION Left 04/02/2016   Procedure: EXCISION 1 CM CYST ON LEFT SIDE OF FACE;  Surgeon: Franky Macho, MD;  Location: [facility];  Service: General;  Laterality: Left;  Marland Kitchen VIDEO ASSISTED THORACOSCOPY (VATS)/DECORTICATION Right 05/09/2013   Procedure: VIDEO ASSISTED THORACOSCOPY (VATS)/DECORTICATION;  Surgeon: Loreli Slot, MD;  Location: [facility] OR;  Service: Thoracic;  Laterality: Right;  . WEDGE RESECTION Right 05/09/2013   Procedure: WEDGE RESECTION RIGHT LOWER LOBE;  Surgeon: Loreli Slot, MD;  Location: [facility] OR;  Service: Thoracic;  Laterality: Right;    Short Social History:  Social History   Tobacco Use  . Smoking status: Former Smoker    Packs/day: 0.50    Years: 40.00    Pack years: 20.00    Types: Cigarettes    Quit date: 04/28/2012    Years since quitting: 8.3  . Smokeless tobacco: Former Engineer, water Use Topics  . Alcohol use: No    Allergies  Allergen Reactions  . Penicillins Hives, Shortness Of Breath and Itching    Tolerates Ceftin Has patient had a PCN reaction causing immediate rash, facial/tongue/throat swelling, SOB or lightheadedness with hypotension: Yes Has patient had a PCN reaction causing  severe rash involving mucus membranes or skin necrosis: No Has patient had a PCN reaction that required hospitalization No Has patient had a PCN reaction occurring within the last 10 years: No If all of the above answers are "NO", then may proceed with Cephalosporin use.   . Ciprofloxacin Hives and Itching  . Hydrocodone-Acetaminophen Itching    Pt can take acetaminophen  . Percocet [Oxycodone-Acetaminophen] Itching  . Biotin Itching and Rash    Current Outpatient Medications  Medication Sig Dispense Refill  . albuterol (VENTOLIN HFA) 108 (90 Base) MCG/ACT inhaler     . EPINEPHrine (PRIMATENE MIST IN) Inhale 2  puffs into the lungs 3 (three) times daily as needed (shortness of breath).    Marland Kitchen FARXIGA 10 MG TABS tablet Take 10 mg by mouth daily.    . fluticasone (FLONASE) 50 MCG/ACT nasal spray Place into both nostrils.    . furosemide (LASIX) 20 MG tablet Take 20 mg by mouth daily.    Marland Kitchen glipiZIDE (GLUCOTROL) 10 MG tablet Take 10 mg by mouth 2 (two) times daily.    . Insulin Glargine-Lixisenatide (SOLIQUA [place]) Inject 22 Units into the skin at bedtime.    Marland Kitchen losartan (COZAAR) 100 MG tablet Take 100 mg by mouth daily.  0  . meloxicam (MOBIC) 15 MG tablet Take 15 mg by mouth daily.    . metFORMIN (GLUCOPHAGE) 1000 MG tablet Take 1,000 mg by mouth daily with breakfast.    . metoprolol tartrate (LOPRESSOR) 25 MG tablet Take 25 mg by mouth 2 (two) times daily.     No current facility-administered medications for this visit.    Review of Systems  Constitutional:  Constitutional negative. HENT: HENT negative.  Eyes: Eyes negative.  Respiratory: Respiratory negative.  Cardiovascular: Positive for claudication.  GI: Gastrointestinal negative.  Musculoskeletal: Musculoskeletal negative.  Skin: Skin negative.  Neurological: Neurological negative. Hematologic: Hematologic/lymphatic negative.  Psychiatric: Psychiatric negative.        Objective:  Objective   Vitals:   09/05/20 0954  BP: 101/65  Pulse: 95  Resp: 20  Temp: 98.1 F (36.7 C)  SpO2: 95%    Physical Exam HENT:     Head: Normocephalic.     Nose:     Comments: Wearing a mask Eyes:     Pupils: Pupils are equal, round, and reactive to light.  Neck:     Vascular: No carotid bruit.  Cardiovascular:     Pulses:          Femoral pulses are 2+ on the right side and 2+ on the left side.      Popliteal pulses are 0 on the right side and 0 on the left side.  Pulmonary:     Effort: Pulmonary effort is normal.     Breath sounds: Normal breath sounds.  Abdominal:     General: Abdomen is flat.     Palpations: Abdomen is soft. There is no  mass.  Musculoskeletal:        General: No swelling. Normal range of motion.  Skin:    General: Skin is warm.     Capillary Refill: Capillary refill takes less than 2 seconds.  Neurological:     General: No focal deficit present.     Mental Status: She is alert.  Psychiatric:        Mood and Affect: Mood normal.        Behavior: Behavior normal.        Thought Content: Thought content normal.  Judgment: Judgment normal.     Data: ABI +---------+------------------+-----+----------+--------+  Right  Rt Pressure (mmHg)IndexWaveform Comment   +---------+------------------+-----+----------+--------+  Brachial 151                      +---------+------------------+-----+----------+--------+  PTA   82        0.54 monophasic      +---------+------------------+-----+----------+--------+  DP    72        0.48 monophasic      +---------+------------------+-----+----------+--------+  Great Toe61        0.40            +---------+------------------+-----+----------+--------+   +---------+------------------+-----+----------+-------+  Left   Lt Pressure (mmHg)IndexWaveform Comment  +---------+------------------+-----+----------+-------+  Brachial 150                      +---------+------------------+-----+----------+-------+  PTA   81        0.54 monophasic      +---------+------------------+-----+----------+-------+  DP    59        0.39 monophasic      +---------+------------------+-----+----------+-------+  Great Toe50        0.33            +---------+------------------+-----+----------+-------+   +-------+-----------+-----------+------------+------------+  ABI/TBIToday's ABIToday's TBIPrevious ABIPrevious TBI  +-------+-----------+-----------+------------+------------+  Right  0.54    0.40                  +-------+-----------+-----------+------------+------------+  Left  0.54    0.33                  +-------+-----------+-----------+------------+------------+     Lower extremity duplex Summary:  Right: 50-74% stenosis noted in the superficial femoral artery.   Left: 50-74% stenosis noted in the superficial femoral artery.      Assessment/Plan:     65 year old female followed for carotid disease which has been asymptomatic also has short distance claudication recently did quit smoking.  I have suggested a walking program for her and as she has not been taking aspirin I have recommended enteric-coated baby aspirin every other day.  She will follow-up in 6 months with carotid studies as well as lower extremity ABIs.  Hopefully she will increase her walking distance now that she is not smoking and will be dedicated to walking during the warmer weather.  I discussed with her we can always proceed with angiography but this should be a last resort for claudication     Maeola Harman MD [facility]

## 2020-09-08 ENCOUNTER — Other Ambulatory Visit: Payer: Self-pay

## 2020-09-08 DIAGNOSIS — I6529 Occlusion and stenosis of unspecified carotid artery: Secondary | ICD-10-CM

## 2020-09-08 DIAGNOSIS — I70219 Atherosclerosis of native arteries of extremities with intermittent claudication, unspecified extremity: Secondary | ICD-10-CM

## 2020-10-09 DIAGNOSIS — E109 Type 1 diabetes mellitus without complications: Secondary | ICD-10-CM | POA: Diagnosis not present

## 2020-10-09 DIAGNOSIS — M5416 Radiculopathy, lumbar region: Secondary | ICD-10-CM | POA: Diagnosis not present

## 2020-10-09 DIAGNOSIS — E1122 Type 2 diabetes mellitus with diabetic chronic kidney disease: Secondary | ICD-10-CM | POA: Diagnosis not present

## 2020-10-09 DIAGNOSIS — Z6834 Body mass index (BMI) 34.0-34.9, adult: Secondary | ICD-10-CM | POA: Diagnosis not present

## 2020-10-09 DIAGNOSIS — I10 Essential (primary) hypertension: Secondary | ICD-10-CM | POA: Diagnosis not present

## 2020-10-09 DIAGNOSIS — R202 Paresthesia of skin: Secondary | ICD-10-CM | POA: Diagnosis not present

## 2020-10-09 DIAGNOSIS — E7849 Other hyperlipidemia: Secondary | ICD-10-CM | POA: Diagnosis not present

## 2020-10-09 DIAGNOSIS — I739 Peripheral vascular disease, unspecified: Secondary | ICD-10-CM | POA: Diagnosis not present

## 2020-10-09 DIAGNOSIS — G8314 Monoplegia of lower limb affecting left nondominant side: Secondary | ICD-10-CM | POA: Diagnosis not present

## 2020-10-09 DIAGNOSIS — Z0000 Encounter for general adult medical examination without abnormal findings: Secondary | ICD-10-CM | POA: Diagnosis not present

## 2020-10-14 ENCOUNTER — Other Ambulatory Visit (HOSPITAL_COMMUNITY): Payer: Self-pay | Admitting: Family Medicine

## 2020-10-14 ENCOUNTER — Other Ambulatory Visit: Payer: Self-pay | Admitting: Family Medicine

## 2020-10-14 DIAGNOSIS — M5416 Radiculopathy, lumbar region: Secondary | ICD-10-CM

## 2020-10-18 ENCOUNTER — Ambulatory Visit (HOSPITAL_COMMUNITY): Payer: Medicare HMO

## 2020-10-18 ENCOUNTER — Encounter (HOSPITAL_COMMUNITY): Payer: Self-pay

## 2020-10-20 DIAGNOSIS — M4727 Other spondylosis with radiculopathy, lumbosacral region: Secondary | ICD-10-CM | POA: Diagnosis not present

## 2020-10-20 DIAGNOSIS — M5116 Intervertebral disc disorders with radiculopathy, lumbar region: Secondary | ICD-10-CM | POA: Diagnosis not present

## 2020-10-20 DIAGNOSIS — M4807 Spinal stenosis, lumbosacral region: Secondary | ICD-10-CM | POA: Diagnosis not present

## 2020-10-20 DIAGNOSIS — M4726 Other spondylosis with radiculopathy, lumbar region: Secondary | ICD-10-CM | POA: Diagnosis not present

## 2020-10-20 DIAGNOSIS — M48061 Spinal stenosis, lumbar region without neurogenic claudication: Secondary | ICD-10-CM | POA: Diagnosis not present

## 2020-10-20 DIAGNOSIS — Z9889 Other specified postprocedural states: Secondary | ICD-10-CM | POA: Diagnosis not present

## 2020-11-04 DIAGNOSIS — Z6835 Body mass index (BMI) 35.0-35.9, adult: Secondary | ICD-10-CM | POA: Insufficient documentation

## 2020-11-04 DIAGNOSIS — I10 Essential (primary) hypertension: Secondary | ICD-10-CM | POA: Diagnosis not present

## 2020-11-04 DIAGNOSIS — M5416 Radiculopathy, lumbar region: Secondary | ICD-10-CM | POA: Diagnosis not present

## 2020-11-27 ENCOUNTER — Ambulatory Visit (HOSPITAL_COMMUNITY): Payer: Medicare HMO | Attending: Neurosurgery | Admitting: Physical Therapy

## 2020-11-27 ENCOUNTER — Encounter (HOSPITAL_COMMUNITY): Payer: Self-pay | Admitting: Physical Therapy

## 2020-11-27 ENCOUNTER — Other Ambulatory Visit: Payer: Self-pay

## 2020-11-27 DIAGNOSIS — M545 Low back pain, unspecified: Secondary | ICD-10-CM | POA: Insufficient documentation

## 2020-11-27 DIAGNOSIS — M6281 Muscle weakness (generalized): Secondary | ICD-10-CM | POA: Insufficient documentation

## 2020-11-27 NOTE — Therapy (Signed)
[facility] [address] [place], [place], [address] Phone: [phone]   Fax:  [phone]  Physical Therapy Evaluation  Patient Details  Name: Kelli Summers MRN: [id number] Date of Birth: 1956/05/09 Referring Provider (PT): Hoyt Koch MD   Encounter Date: 11/27/2020   PT End of Session - 11/27/20 1515    Visit Number 1    Number of Visits 12    Date for PT Re-Evaluation 01/08/21    Authorization Type [employer]    Authorization Time Period check auth    PT Start Time 1438    PT Stop Time 1517    PT Time Calculation (min) 39 min    Activity Tolerance Patient tolerated treatment well    Behavior During Therapy [facility] for tasks assessed/performed           Past Medical History:  Diagnosis Date  . Arthritis   . Chronic back pain   . Chronic knee pain   . Depression   . Diabetes mellitus   . Ejection fraction   . History of stress test 01/2010   normal myoview stress test  . Hypertension   . Sinus tachycardia     Past Surgical History:  Procedure Laterality Date  . ABDOMINAL HYSTERECTOMY    . BACK SURGERY    . CATARACT EXTRACTION W/PHACO Right 01/12/2019   Procedure: CATARACT EXTRACTION PHACO AND INTRAOCULAR LENS PLACEMENT RIGHT EYE;  Surgeon: Fabio Pierce, MD;  Location: [facility];  Service: Ophthalmology;  Laterality: Right;  right  . CATARACT EXTRACTION W/PHACO Left 01/26/2019   Procedure: CATARACT EXTRACTION PHACO AND INTRAOCULAR LENS PLACEMENT (IOC);  Surgeon: Fabio Pierce, MD;  Location: [facility];  Service: Ophthalmology;  Laterality: Left;  CDE: 7.07  . CHOLECYSTECTOMY    . COLONOSCOPY N/A 04/15/2015   Procedure: COLONOSCOPY;  Surgeon: Franky Macho, MD;  Location: [facility];  Service: Gastroenterology;  Laterality: N/A;  . CYST EXCISION Left 04/02/2016   Procedure: EXCISION 1 CM CYST ON LEFT SIDE OF FACE;  Surgeon: Franky Macho, MD;  Location: [facility];  Service: General;  Laterality: Left;  Marland Kitchen VIDEO ASSISTED  THORACOSCOPY (VATS)/DECORTICATION Right 05/09/2013   Procedure: VIDEO ASSISTED THORACOSCOPY (VATS)/DECORTICATION;  Surgeon: Loreli Slot, MD;  Location: [facility] OR;  Service: Thoracic;  Laterality: Right;  . WEDGE RESECTION Right 05/09/2013   Procedure: WEDGE RESECTION RIGHT LOWER LOBE;  Surgeon: Loreli Slot, MD;  Location: [facility] OR;  Service: Thoracic;  Laterality: Right;    There were no vitals filed for this visit.    Subjective Assessment - 11/27/20 1448    Subjective Patient presents to physical therapy with complaint of chronic LBP. She reports history of degenerative discs and stenosis. She also has cement in her back for vertebral fracture. She has pain travel into her legs at times but says this is due to arterial blockage in her groin area. She does report some numbness and tingling in thighs. She started taking Lyrica recently which is helping but it makes her feel a little unsteady. She has been taking this at night.    Limitations Standing;Walking;Lifting;House hold activities    How long can you stand comfortably? 1 hour    How long can you walk comfortably? 5 mins    Patient Stated Goals Get out of bed with less pain, avoid surgery    Currently in Pain? Yes    Pain Score 7     Pain Location Back    Pain Orientation Posterior  Pain Descriptors / Indicators Tightness    Pain Type Chronic pain    Pain Onset More than a month ago    Pain Frequency Intermittent    Aggravating Factors  standing, walking    Pain Relieving Factors sitting, meds    Effect of Pain on Daily Activities Limits              [facility] PT Assessment - 11/27/20 0001      Assessment   Medical Diagnosis LBP    Referring Provider (PT) Hoyt Koch MD    Onset Date/Surgical Date --   > 1 year   Prior Therapy Yes      Balance Screen   Has the patient fallen in the past 6 months No      Home Environment   Living Environment Private residence    Living Arrangements Children    Available  Help at Discharge Family      Prior Function   Level of Independence Independent    Vocation Part time employment    Vocation Requirements [place]      Cognition   Overall Cognitive Status Within Functional Limits for tasks assessed      Observation/Other Assessments   Focus on Therapeutic Outcomes (FOTO)  43% function      ROM / Strength   AROM / PROM / Strength AROM;Strength      AROM   AROM Assessment Site Lumbar    Lumbar Flexion WFL    Lumbar Extension 90% limited    Lumbar - Right Side Bend 25% limited    Lumbar - Left Side Bend 50% limited      Strength   Strength Assessment Site Hip;Knee;Ankle    Right/Left Hip Right;Left    Right Hip Flexion 4+/5    Right Hip Extension 3+/5    Right Hip ABduction 4-/5    Left Hip Flexion 4+/5    Left Hip Extension 3+/5    Left Hip ABduction 4-/5    Right/Left Knee Right;Left    Right Knee Extension 5/5    Left Knee Extension 5/5    Right/Left Ankle Right;Left      Palpation   Palpation comment MOd TTP about lumbar paraspinals                      Objective measurements completed on examination: See above findings.       [facility] Adult PT Treatment/Exercise - 11/27/20 0001      Exercises   Exercises Lumbar      Lumbar Exercises: Stretches   Lower Trunk Rotation 5 reps      Lumbar Exercises: Supine   Pelvic Tilt 10 reps    Glut Set 10 reps                  PT Education - 11/27/20 1451    Education Details on evaluation findings, POC and HEP    Person(s) Educated Patient    Methods Explanation;Handout    Comprehension Verbalized understanding            PT Short Term Goals - 11/27/20 1635      PT SHORT TERM GOAL #1   Title Patient will be independent with initial HEP and self-management strategies to improve functional outcomes    Time 2    Period Weeks    Status New    Target Date 12/11/20             PT Long Term Goals -  11/27/20 1635      PT LONG TERM GOAL #1    Title Patient will improve FOTO score to predicted value to indicate improvement in functional outcomes    Time 6    Period Weeks    Status New    Target Date 01/08/21      PT LONG TERM GOAL #2   Title Patient will report at least 75% overall improvement in subjective complaint to indicate improvement in ability to perform ADLs.    Time 6    Period Weeks    Status New    Target Date 01/08/21      PT LONG TERM GOAL #3   Title Patient will have equal to or > 4/5 MMT hip extension bilaterally to improve ability to perform functional mobility, stair ambulation and ADLs.    Time 6    Period Weeks    Status New    Target Date 01/08/21      PT LONG TERM GOAL #4   Title Patient will be able to walk >15 minutes with pain not to exceed 3/10 in lumbar to improve ability to perform cooking and grooming ADLs.    Time 6    Period Weeks    Status New    Target Date 01/08/21                  Plan - 11/27/20 1631    Clinical Impression Statement Patient is a 65 y.o. female who presents to physical therapy with complaint of LBP. Patient demonstrates decreased strength, ROM restriction and increased tenderness to palpation which are likely contributing to symptoms of pain and are negatively impacting patient ability to perform ADLs and functional mobility tasks. Patient will benefit from skilled physical therapy services to address these deficits to reduce pain and improve level of function with ADLs and functional mobility tasks.    Personal Factors and Comorbidities Comorbidity 3+    Comorbidities arthritis, DM, PVD    Examination-Activity Limitations Stairs;Stand;Transfers;Carry;Bend;Squat    Examination-Participation Restrictions Community Activity;Laundry;Occupation;Yard Work    Stability/Clinical Decision Making Stable/Uncomplicated    Optometrist Low    Rehab Potential Good    PT Frequency 2x / week    PT Duration 6 weeks    PT Treatment/Interventions ADLs/Self Care  Home Management;Aquatic Therapy;Biofeedback;Cryotherapy;Parrafin;Ultrasound;Functional mobility training;Neuromuscular re-education;Manual techniques;Passive range of motion;Dry needling;Joint Manipulations;Spinal Manipulations;Vasopneumatic Device;Taping;Splinting;Visual/perceptual remediation/compensation;Energy conservation;Compression bandaging;Patient/family education;Therapeutic activities;Therapeutic exercise;Orthotic Fit/Training;Balance training;Scar mobilization;DME Instruction;Gait training;Moist Heat;Traction;Stair training;Iontophoresis 4mg /ml Dexamethasone;Electrical Stimulation;Fluidtherapy;Contrast Bath    PT Next Visit Plan Progress core and hip strength as tolerated. Assess response to flexion based activity. Add SKTC, DKTC, try bridge, ab set, ab march    PT Home Exercise Plan Eval: LTR, pelvic tilt, glute set    Consulted and Agree with Plan of Care Patient           Patient will benefit from skilled therapeutic intervention in order to improve the following deficits and impairments:  Pain,Improper body mechanics,Increased fascial restricitons,Postural dysfunction,Decreased activity tolerance,Decreased range of motion,Decreased strength,Hypomobility,Decreased mobility,Impaired flexibility  Visit Diagnosis: Low back pain, unspecified back pain laterality, unspecified chronicity, unspecified whether sciatica present  Muscle weakness (generalized)     Problem List Patient Active Problem List   Diagnosis Date Noted  . CKD (chronic kidney disease), stage II 11/23/2016  . AKI (acute kidney injury) (HCC) 11/23/2016  . Hyponatremia 11/23/2016  . Uncontrolled blood glucose 11/23/2016  . Depression 11/23/2016  . COPD with acute exacerbation (HCC) 11/13/2013  . Smoking 11/13/2013  . Hypertension 10/15/2013  .  Ejection fraction   . Sinus tachycardia   . Anemia, blood loss 05/10/2013  . Acute hyperglycemia 04/30/2013  . SHOULDER PAIN 03/02/2007  . Insulin-requiring or  dependent type II diabetes mellitus (HCC) 02/14/2007   4:39 PM, 11/27/20 Kelli Summers PT DPT  Physical Therapist with [place]  [facility]  [phone]   [facility] [address] [place], [place], [address] Phone: [phone]   Fax:  [phone]  Name: Kelli Summers MRN: [id number] Date of Birth: [birth year]

## 2020-11-27 NOTE — Patient Instructions (Signed)
Access Code: [email or web address] URL: [email or web address] Date: 11/27/2020 Prepared by: Georges Lynch  Exercises Supine Posterior Pelvic Tilt - 3 x daily - 7 x weekly - 2 sets - 10 reps - 5 second hold Hooklying Gluteal Sets - 3 x daily - 7 x weekly - 2 sets - 10 reps - 5 second hold Supine Lower Trunk Rotation - 3 x daily - 7 x weekly - 1 sets - 10 reps - 10 second hold

## 2020-12-04 ENCOUNTER — Ambulatory Visit (HOSPITAL_COMMUNITY): Payer: Medicare HMO

## 2020-12-08 ENCOUNTER — Ambulatory Visit (HOSPITAL_COMMUNITY): Payer: Medicare HMO | Admitting: Physical Therapy

## 2020-12-08 ENCOUNTER — Telehealth (HOSPITAL_COMMUNITY): Payer: Self-pay | Admitting: Physical Therapy

## 2020-12-08 NOTE — Telephone Encounter (Signed)
Patient no show, unable to leave message for patient making her aware of missed appointment.   3:50 PM, 12/08/20 Wyman Songster PT, DPT Physical Therapist at [facility]

## 2020-12-11 ENCOUNTER — Encounter (HOSPITAL_COMMUNITY): Payer: Medicare HMO | Admitting: Physical Therapy

## 2020-12-11 ENCOUNTER — Telehealth (HOSPITAL_COMMUNITY): Payer: Self-pay | Admitting: Physical Therapy

## 2020-12-11 NOTE — Telephone Encounter (Signed)
Called patient about missed appointment and reminder of next visit scheduled 6/20  5:39 PM, 12/11/20 Georges Lynch PT DPT  Physical Therapist with [facility]  [phone]

## 2020-12-15 ENCOUNTER — Ambulatory Visit (HOSPITAL_COMMUNITY): Payer: Medicare HMO | Admitting: Physical Therapy

## 2020-12-18 ENCOUNTER — Ambulatory Visit (HOSPITAL_COMMUNITY): Payer: Medicare HMO | Admitting: Physical Therapy

## 2020-12-22 ENCOUNTER — Ambulatory Visit (HOSPITAL_COMMUNITY): Payer: Medicare HMO | Admitting: Physical Therapy

## 2020-12-25 ENCOUNTER — Encounter (HOSPITAL_COMMUNITY): Payer: Medicare HMO | Admitting: Physical Therapy

## 2021-01-29 DIAGNOSIS — Z6835 Body mass index (BMI) 35.0-35.9, adult: Secondary | ICD-10-CM | POA: Diagnosis not present

## 2021-01-29 DIAGNOSIS — E1165 Type 2 diabetes mellitus with hyperglycemia: Secondary | ICD-10-CM | POA: Diagnosis not present

## 2021-01-29 DIAGNOSIS — B373 Candidiasis of vulva and vagina: Secondary | ICD-10-CM | POA: Diagnosis not present

## 2021-01-29 DIAGNOSIS — E6609 Other obesity due to excess calories: Secondary | ICD-10-CM | POA: Diagnosis not present

## 2021-01-29 DIAGNOSIS — Z23 Encounter for immunization: Secondary | ICD-10-CM | POA: Diagnosis not present

## 2021-01-29 DIAGNOSIS — I10 Essential (primary) hypertension: Secondary | ICD-10-CM | POA: Diagnosis not present

## 2021-02-21 ENCOUNTER — Emergency Department (HOSPITAL_COMMUNITY)
Admission: EM | Admit: 2021-02-21 | Discharge: 2021-02-21 | Disposition: A | Discharge status: Home / Self Care | Payer: Medicare HMO | Attending: Emergency Medicine | Admitting: Emergency Medicine

## 2021-02-21 ENCOUNTER — Encounter (HOSPITAL_COMMUNITY): Payer: Self-pay

## 2021-02-21 ENCOUNTER — Emergency Department (HOSPITAL_COMMUNITY): Payer: Medicare HMO

## 2021-02-21 DIAGNOSIS — W19XXXA Unspecified fall, initial encounter: Secondary | ICD-10-CM

## 2021-02-21 DIAGNOSIS — W010XXA Fall on same level from slipping, tripping and stumbling without subsequent striking against object, initial encounter: Secondary | ICD-10-CM | POA: Insufficient documentation

## 2021-02-21 DIAGNOSIS — S42352A Displaced comminuted fracture of shaft of humerus, left arm, initial encounter for closed fracture: Secondary | ICD-10-CM | POA: Diagnosis not present

## 2021-02-21 DIAGNOSIS — Z7984 Long term (current) use of oral hypoglycemic drugs: Secondary | ICD-10-CM | POA: Diagnosis not present

## 2021-02-21 DIAGNOSIS — E1122 Type 2 diabetes mellitus with diabetic chronic kidney disease: Secondary | ICD-10-CM | POA: Diagnosis not present

## 2021-02-21 DIAGNOSIS — S4992XA Unspecified injury of left shoulder and upper arm, initial encounter: Secondary | ICD-10-CM | POA: Diagnosis present

## 2021-02-21 DIAGNOSIS — N182 Chronic kidney disease, stage 2 (mild): Secondary | ICD-10-CM | POA: Insufficient documentation

## 2021-02-21 DIAGNOSIS — R52 Pain, unspecified: Secondary | ICD-10-CM | POA: Diagnosis not present

## 2021-02-21 DIAGNOSIS — M19012 Primary osteoarthritis, left shoulder: Secondary | ICD-10-CM | POA: Diagnosis not present

## 2021-02-21 DIAGNOSIS — Y92009 Unspecified place in unspecified non-institutional (private) residence as the place of occurrence of the external cause: Secondary | ICD-10-CM | POA: Diagnosis not present

## 2021-02-21 DIAGNOSIS — S42342A Displaced spiral fracture of shaft of humerus, left arm, initial encounter for closed fracture: Secondary | ICD-10-CM | POA: Diagnosis not present

## 2021-02-21 DIAGNOSIS — J441 Chronic obstructive pulmonary disease with (acute) exacerbation: Secondary | ICD-10-CM | POA: Insufficient documentation

## 2021-02-21 DIAGNOSIS — I129 Hypertensive chronic kidney disease with stage 1 through stage 4 chronic kidney disease, or unspecified chronic kidney disease: Secondary | ICD-10-CM | POA: Insufficient documentation

## 2021-02-21 DIAGNOSIS — Y9301 Activity, walking, marching and hiking: Secondary | ICD-10-CM | POA: Insufficient documentation

## 2021-02-21 DIAGNOSIS — S0181XA Laceration without foreign body of other part of head, initial encounter: Secondary | ICD-10-CM | POA: Diagnosis not present

## 2021-02-21 DIAGNOSIS — S0081XA Abrasion of other part of head, initial encounter: Secondary | ICD-10-CM | POA: Diagnosis not present

## 2021-02-21 DIAGNOSIS — M7989 Other specified soft tissue disorders: Secondary | ICD-10-CM | POA: Diagnosis not present

## 2021-02-21 DIAGNOSIS — S42292A Other displaced fracture of upper end of left humerus, initial encounter for closed fracture: Secondary | ICD-10-CM | POA: Diagnosis not present

## 2021-02-21 DIAGNOSIS — Z7951 Long term (current) use of inhaled steroids: Secondary | ICD-10-CM | POA: Insufficient documentation

## 2021-02-21 DIAGNOSIS — Z794 Long term (current) use of insulin: Secondary | ICD-10-CM | POA: Insufficient documentation

## 2021-02-21 DIAGNOSIS — Z87891 Personal history of nicotine dependence: Secondary | ICD-10-CM | POA: Insufficient documentation

## 2021-02-21 DIAGNOSIS — Z23 Encounter for immunization: Secondary | ICD-10-CM | POA: Insufficient documentation

## 2021-02-21 DIAGNOSIS — S0990XA Unspecified injury of head, initial encounter: Secondary | ICD-10-CM | POA: Insufficient documentation

## 2021-02-21 DIAGNOSIS — R0902 Hypoxemia: Secondary | ICD-10-CM | POA: Diagnosis not present

## 2021-02-21 MED ORDER — TETANUS-DIPHTH-ACELL PERTUSSIS 5-2.5-18.5 LF-MCG/0.5 IM SUSY
0.5000 mL | PREFILLED_SYRINGE | Freq: Once | INTRAMUSCULAR | Status: AC
  Administered 2021-02-21: 0.5 mL via INTRAMUSCULAR
  Filled 2021-02-21: qty 0.5

## 2021-02-21 MED ORDER — ONDANSETRON 8 MG PO TBDP
8.0000 mg | ORAL_TABLET | Freq: Once | ORAL | Status: AC
  Administered 2021-02-21: 8 mg via ORAL
  Filled 2021-02-21: qty 1

## 2021-02-21 MED ORDER — HYDROCODONE-ACETAMINOPHEN 5-325 MG PO TABS: 1.0000 | ORAL_TABLET | Freq: Four times a day (QID) | ORAL | 0 refills | Status: DC | PRN

## 2021-02-21 MED ORDER — MORPHINE SULFATE (PF) 4 MG/ML IV SOLN
4.0000 mg | Freq: Once | INTRAVENOUS | Status: DC
Start: 2021-02-21 — End: 2021-02-21
  Filled 2021-02-21: qty 1

## 2021-02-21 MED ORDER — MORPHINE SULFATE (PF) 4 MG/ML IV SOLN
4.0000 mg | Freq: Once | INTRAVENOUS | Status: AC
  Administered 2021-02-21: 4 mg via INTRAVENOUS

## 2021-02-21 NOTE — Discharge Instructions (Addendum)
I have prescribed you a strong narcotic called Vicodin. Please only take this as prescribed. This medication also has tylenol in it, so please be sure you are not taking more than 3000 mg of tylenol per day. Do not drive or operate heavy machinery after taking this medication. Do not mix it with alcohol.   Below is the contact information for Dr. Romeo Apple.  Please give him a call first thing on Monday morning and schedule an appointment for follow-up.  If you develop any new or worsening symptoms over the weekend such as numbness in the arm or worsening pain and swelling please come back to the emergency department immediately for reevaluation.  It was a pleasure to meet you.

## 2021-02-21 NOTE — ED Notes (Signed)
Pt here with reports of tripping over her dog and falling, pt has small lac to right eyebrow, no active bleeding, likely from her glasses. Pt also has minimal swelling to right eye from a stye. Pt has sling to left arm for support with c/o left upper arm pain, there is no obvious deformity but left bicep area is swollen and painful. CMS intact

## 2021-02-21 NOTE — ED Provider Notes (Signed)
[facility] EMERGENCY DEPARTMENT Provider Note   CSN: [id number] Arrival date & time: 02/21/21  1540     History Chief Complaint  Patient presents with   Fall   Arm Injury    Pt tripped over dog, fell, poss hit right side of head and has left forearm deformity per ems    Kelli Summers is a 65 y.o. female.  HPI Patient is a 65 year old female with a medical history as noted below.  She presents to the emergency department due to a fall.  Patient states she was walking in her home just prior to arrival and tripped over her dog who was lying on the floor.  She states that she attempted to catch her self with her left arm and then fell to her right side.  Reports a small abrasion to the right forehead with no active bleeding as well as pain and swelling along the left upper arm.  Denies any shoulder or elbow pain in the left arm.  No numbness or weakness.  States she is not anticoagulated.  Denies any LOC.  Denies any chest pain, shortness of breath, abdominal pain.    Past Medical History:  Diagnosis Date   Arthritis    Chronic back pain    Chronic knee pain    Depression    Diabetes mellitus    Ejection fraction    History of stress test 01/2010   normal myoview stress test   Hypertension    Sinus tachycardia     Patient Active Problem List   Diagnosis Date Noted   CKD (chronic kidney disease), stage II 11/23/2016   AKI (acute kidney injury) (HCC) 11/23/2016   Hyponatremia 11/23/2016   Uncontrolled blood glucose 11/23/2016   Depression 11/23/2016   COPD with acute exacerbation (HCC) 11/13/2013   Smoking 11/13/2013   Hypertension 10/15/2013   Ejection fraction    Sinus tachycardia    Anemia, blood loss 05/10/2013   Acute hyperglycemia 04/30/2013   SHOULDER PAIN 03/02/2007   Insulin-requiring or dependent type II diabetes mellitus (HCC) 02/14/2007    Past Surgical History:  Procedure Laterality Date   ABDOMINAL HYSTERECTOMY     BACK SURGERY     CATARACT  EXTRACTION W/PHACO Right 01/12/2019   Procedure: CATARACT EXTRACTION PHACO AND INTRAOCULAR LENS PLACEMENT RIGHT EYE;  Surgeon: Fabio Pierce, MD;  Location: [facility] ORS;  Service: Ophthalmology;  Laterality: Right;  right   CATARACT EXTRACTION W/PHACO Left 01/26/2019   Procedure: CATARACT EXTRACTION PHACO AND INTRAOCULAR LENS PLACEMENT (IOC);  Surgeon: Fabio Pierce, MD;  Location: [facility]S;  Service: Ophthalmology;  Laterality: Left;  CDE: 7.07   CHOLECYSTECTOMY     COLONOSCOPY N/A 04/15/2015   Procedure: COLONOSCOPY;  Surgeon: Franky Macho, MD;  Location: [facility] SUITE;  Service: Gastroenterology;  Laterality: N/A;   CYST EXCISION Left 04/02/2016   Procedure: EXCISION 1 CM CYST ON LEFT SIDE OF FACE;  Surgeon: Franky Macho, MD;  Location: [facility]S;  Service: General;  Laterality: Left;   VIDEO ASSISTED THORACOSCOPY (VATS)/DECORTICATION Right 05/09/2013   Procedure: VIDEO ASSISTED THORACOSCOPY (VATS)/DECORTICATION;  Surgeon: Loreli Slot, MD;  Location: [facility] OR;  Service: Thoracic;  Laterality: Right;   WEDGE RESECTION Right 05/09/2013   Procedure: WEDGE RESECTION RIGHT LOWER LOBE;  Surgeon: Loreli Slot, MD;  Location: [facility] OR;  Service: Thoracic;  Laterality: Right;     OB History   No obstetric history on file.     Family History  Problem Relation Age of Onset  Emphysema Maternal Grandfather        smoked   Breast cancer Maternal Aunt     Social History   Tobacco Use   Smoking status: Former    Packs/day: 0.50    Years: 40.00    Pack years: 20.00    Types: Cigarettes    Quit date: 04/28/2012    Years since quitting: 8.8   Smokeless tobacco: Former  Building services engineer Use: Never used  Substance Use Topics   Alcohol use: No   Drug use: No    Home Medications Prior to Admission medications   Medication Sig Start Date End Date Taking? Authorizing Provider  HYDROcodone-acetaminophen (NORCO/VICODIN) 5-325 MG tablet Take 1 tablet by mouth every 6 (six) hours as needed.  02/21/21  Yes Placido Sou, PA-C  albuterol (VENTOLIN HFA) 108 (90 Base) MCG/ACT inhaler  04/02/20   [provider]  EPINEPHrine (PRIMATENE MIST IN) Inhale 2 puffs into the lungs 3 (three) times daily as needed (shortness of breath).    [provider]  FARXIGA 10 MG TABS tablet Take 10 mg by mouth daily. 03/20/20   [provider]  fluticasone (FLONASE) 50 MCG/ACT nasal spray Place into both nostrils. 03/28/20   [provider]  furosemide (LASIX) 20 MG tablet Take 20 mg by mouth daily. 02/14/20   [provider]  glipiZIDE (GLUCOTROL) 10 MG tablet Take 10 mg by mouth 2 (two) times daily. 03/06/20   [provider]  Insulin Glargine-Lixisenatide (SOLIQUA Advance) Inject 22 Units into the skin at bedtime.    [provider]  losartan (COZAAR) 100 MG tablet Take 100 mg by mouth daily. 06/03/17   [provider]  meloxicam (MOBIC) 15 MG tablet Take 15 mg by mouth daily. 03/05/20   [provider]  metFORMIN (GLUCOPHAGE) 1000 MG tablet Take 1,000 mg by mouth daily with breakfast.    [provider]  metoprolol tartrate (LOPRESSOR) 25 MG tablet Take 25 mg by mouth 2 (two) times daily.    [provider]    Allergies    Penicillins, Ciprofloxacin, Hydrocodone-acetaminophen, Percocet [oxycodone-acetaminophen], and Biotin  Review of Systems   Review of Systems  All other systems reviewed and are negative. Ten systems reviewed and are negative for acute change, except as noted in the HPI.   Physical Exam Updated Vital Signs BP 137/89 (BP Location: Left Arm)   Temp 98.2 F (36.8 C) (Oral)   Resp 18   Ht 5\' 4"  (1.626 m)   Wt 93 kg   SpO2 98%   BMI 35.19 kg/m   Physical Exam Vitals and nursing note reviewed.  Constitutional:      General: She is not in acute distress.    Appearance: Normal appearance. She is not ill-appearing, toxic-appearing or diaphoretic.  HENT:     Head: Normocephalic.      Comments: Abrasion noted to the right forehead.  No active bleeding.    Right Ear: Tympanic membrane, ear canal and external ear normal. There is no impacted cerumen.     Left Ear: Tympanic membrane, ear canal and external ear normal. There is no impacted cerumen.     Ears:     Comments: Bilateral ears, EACs, and TMs appear normal.  No hemotympanums.    Nose: Nose normal.     Mouth/Throat:     Mouth: Mucous membranes are moist.     Pharynx: Oropharynx is clear. No oropharyngeal exudate or posterior oropharyngeal erythema.  Eyes:  General: No scleral icterus.       Right eye: No discharge.        Left eye: No discharge.     Extraocular Movements: Extraocular movements intact.     Conjunctiva/sclera: Conjunctivae normal.     Pupils: Pupils are equal, round, and reactive to light.     Comments: Pupils are equal, round, and reactive to light.  Extraocular movements are intact.  Neck:     Comments: No midline C, T, or L-spine tenderness. Cardiovascular:     Rate and Rhythm: Normal rate and regular rhythm.     Pulses: Normal pulses.     Heart sounds: Normal heart sounds. No murmur heard.   No friction rub. No gallop.  Pulmonary:     Effort: Pulmonary effort is normal. No respiratory distress.     Breath sounds: Normal breath sounds. No stridor. No wheezing, rhonchi or rales.     Comments: No chest wall tenderness. Chest:     Chest wall: No tenderness.  Abdominal:     General: Abdomen is flat.     Palpations: Abdomen is soft.     Tenderness: There is no abdominal tenderness.     Comments: Protuberant abdomen that is soft and nontender.  Musculoskeletal:        General: Tenderness present. Normal range of motion.     Cervical back: Normal range of motion and neck supple. No tenderness.     Comments: Moderate tenderness appreciated along the left upper arm, that appears to be worst along the triceps.  Mild edema noted in the region with overlying ecchymosis.  Soft compartments.  No  tenderness appreciated in the left shoulder, elbow, wrist, or hand.  Grip strength intact.  Distal sensation intact.  Good cap refill.  2+ radial pulses.  Skin:    General: Skin is warm and dry.  Neurological:     General: No focal deficit present.     Mental Status: She is alert and oriented to person, place, and time.  Psychiatric:        Mood and Affect: Mood normal.        Behavior: Behavior normal.   ED Results / Procedures / Treatments   Labs (all labs ordered are listed, but only abnormal results are displayed) Labs Reviewed - No data to display  EKG None  Radiology CT HEAD WO CONTRAST (5MM)  Result Date: 02/21/2021 CLINICAL DATA:  Fall.  Laceration.  Swelling and bruising. EXAM: CT HEAD WITHOUT CONTRAST TECHNIQUE: Contiguous axial images were obtained from the base of the skull through the vertex without intravenous contrast. COMPARISON:  March 24, 2009 FINDINGS: Brain: No subdural, epidural, or subarachnoid hemorrhage. Ventricles and sulci are unremarkable. Cerebellum, brainstem, and basal cisterns are normal. No mass effect or midline shift. No acute cortical ischemia or infarct. Vascular: Calcified atherosclerosis is seen in the intracranial carotids. Skull: Normal. Negative for fracture or focal lesion. Sinuses/Orbits: No acute finding. Other: None. IMPRESSION: No acute intracranial abnormalities are identified. Electronically Signed   By: Gerome Samavid  Williams III M.D.   On: 02/21/2021 16:58   DG Shoulder Left  Result Date: 02/21/2021 CLINICAL DATA:  Status post trauma. EXAM: LEFT SHOULDER - 2+ VIEW COMPARISON:  February 21, 2021 FINDINGS: Acute fracture deformities are seen involving the left humeral head and mid left humeral shaft. There is no evidence of dislocation. Degenerative changes are seen involving the left acromioclavicular joint and left glenohumeral articulation. Soft tissue swelling is seen adjacent to the previously noted fracture sites.  IMPRESSION: Acute fractures  of the left humeral head and mid left humeral shaft. Electronically Signed   By: Aram Candela M.D.   On: 02/21/2021 17:56   DG Humerus Left  Result Date: 02/21/2021 CLINICAL DATA:  fall, pain, swelling EXAM: LEFT HUMERUS - 2+ VIEW COMPARISON:  December 06, 2006 FINDINGS: There is a displaced spiral fracture of the mid humeral shaft. Distal fracture fragment is displaced approximately 1 shaft width laterally. Linear lucency of the humeral neck may reflect an additional site of fracture. Chronic deformity of the humeral head consistent with sequela of remote prior fracture. IMPRESSION: 1. Displaced spiral fracture of the mid humeral shaft. 2. Possible additional nondisplaced fracture of the humeral neck with evaluation limited secondary osseous changes from sequela of remote prior fracture. Consider dedicated shoulder radiographs for improved evaluation. Electronically Signed   By: Meda Klinefelter M.D.   On: 02/21/2021 17:10    Procedures Procedures   Medications Ordered in ED Medications  Tdap (BOOSTRIX) injection 0.5 mL (has no administration in time range)    ED Course  I have reviewed the triage vital signs and the nursing notes.  Pertinent labs & imaging results that were available during my care of the patient were reviewed by me and considered in my medical decision making (see chart for details).  Clinical Course as of 02/21/21 1837  Sat Feb 21, 2021  1743 I spoke to Dr. Romeo Apple with orthopedics.  Recommends we place patient in a coaptation splint and have her follow-up in his office on Monday. [LJ]    Clinical Course User Index [LJ] Placido Sou, PA-C   MDM Rules/Calculators/A&P                          Pt is a 65 y.o. female who presents to the emergency department due to a fall that occurred prior to arrival.  Imaging: X-ray of the left humerus shows displaced spiral fracture of the mid humeral shaft.  There is possible additional nondisplaced fracture of the humeral  neck with evaluation limited secondary to osseous changes from sacral of remote prior fracture.  Consider dedicated shoulder radiographs for improved evaluation. X-ray of the left shoulder shows acute fractures of the left humeral head and mid left humeral shaft. CT scan of the head without contrast shows no acute intracranial abnormalities.  I, Placido Sou, PA-C, personally reviewed and evaluated these images and lab results as part of my medical decision-making.  Physical exam significant for mild to moderate tenderness along the left upper arm, particularly in the region of patient's mid humeral shaft fracture.  Soft compartments.  Neurovascularly intact distal to the injury.  Grip strength intact.  Patient discussed with Dr. Romeo Apple with orthopedic surgery.  Recommends patient be placed in a coaptation splint and have her follow-up outpatient in his office on Monday.  This was discussed with the patient and she is amenable.  Recommended continued use of OTC medications for pain management but we will also prescribe a short course of Vicodin for breakthrough pain.  We discussed safety regarding this medication.  Feel that the patient is stable for discharge at this time and she is agreeable.  We discussed return precautions at length.  Patient understands to return to the emergency department with any worsening pain, swelling, or numbness.  We discussed symptoms associated with compartment syndrome.  Her questions were answered and she was amicable at the time of discharge.  Note: Portions of this report may  have been transcribed using voice recognition software. Every effort was made to ensure accuracy; however, inadvertent computerized transcription errors may be present.   Final Clinical Impression(s) / ED Diagnoses Final diagnoses:  Closed displaced spiral fracture of shaft of left humerus, initial encounter  Closed fracture of head of left humerus, initial encounter  Fall, initial  encounter   Rx / DC Orders ED Discharge Orders          Ordered    HYDROcodone-acetaminophen (NORCO/VICODIN) 5-325 MG tablet  Every 6 hours PRN        02/21/21 1816             Placido Sou, PA-C 02/21/21 1840    Pollyann Savoy, MD 02/21/21 2257

## 2021-02-23 ENCOUNTER — Ambulatory Visit: Payer: Medicare HMO

## 2021-02-23 ENCOUNTER — Other Ambulatory Visit: Payer: Self-pay

## 2021-02-23 ENCOUNTER — Encounter: Payer: Self-pay | Admitting: Orthopedic Surgery

## 2021-02-23 ENCOUNTER — Ambulatory Visit: Payer: Medicare HMO | Admitting: Orthopedic Surgery

## 2021-02-23 VITALS — BP 143/71 | HR 95 | Ht 64.0 in | Wt 208.5 lb

## 2021-02-23 DIAGNOSIS — W010XXA Fall on same level from slipping, tripping and stumbling without subsequent striking against object, initial encounter: Secondary | ICD-10-CM | POA: Diagnosis not present

## 2021-02-23 DIAGNOSIS — S42342A Displaced spiral fracture of shaft of humerus, left arm, initial encounter for closed fracture: Secondary | ICD-10-CM

## 2021-02-23 NOTE — Progress Notes (Signed)
Chief Complaint  Patient presents with   Arm Injury    DOI 02/21/21  I fell over my dog    History 65 year old female fell over her dog and injured her left wrist proximal humerus fracture and midshaft fracture on 02/21/2021 seen at [facility], ER placed in coaptation splint presents now with said injury for evaluation and management  She has a lot of swelling in the hand most likely secondary to the wrap stopping at the mid forearm  No numbness or tingling is noted no shortness of breath  Physical Exam Vitals and nursing note reviewed.  Constitutional:      Appearance: Normal appearance.  Musculoskeletal:     Comments: Left upper extremity is in a coaptation splint she has swelling from the mid forearm down to the fingers and hand but her radial nerve function is intact she can extend her thumb extend her MP joints and wrist extension is normal  Skin:    Capillary Refill: Capillary refill takes less than 2 seconds.  Neurological:     General: No focal deficit present.     Mental Status: She is alert and oriented to person, place, and time. Mental status is at baseline.  Psychiatric:        Mood and Affect: Mood normal.        Behavior: Behavior normal.        Thought Content: Thought content normal.        Judgment: Judgment normal.    Several x-rays were obtained in the ER on August 27 the first is a proximal humerus shoulder x-ray which shows a impacted proximal humerus fracture and then a midshaft spiral to oblique humeral shaft fracture on a separate x-ray  Repeat x-rays in the splint are obtained to determine displacement  Repeat x-rays in the splint show less than 20 degrees of angulation in any view and it is determined that the proximal humerus fracture is an old fracture  Patient's hand was wrapped down to the finger she is encouraged to actively move them to control swelling she will continue with ibuprofen which is controlling pain and we will take an x-ray next time in  the splint and then converted to a fracture brace

## 2021-03-03 ENCOUNTER — Telehealth: Payer: Self-pay | Admitting: Radiology

## 2021-03-03 NOTE — Telephone Encounter (Signed)
Daughter Marchelle Folks called requested a call to discuss bruising and swelling in hand still.  Call patient back please?  Thanks.

## 2021-03-03 NOTE — Telephone Encounter (Signed)
Called Kelli Summers her hand is very bruised and swollen still I encouraged hand motion and advised ok to unwrap ace bandage and re wrap on lower arm, but not upper arm, she said they have already done that.  Told her for the injury she had we do expect a lot of bruising and pain swelling of the arm and into hand. She voiced understanding.  Encouraged to keep next appointment as scheduled  To Dr Mauri Brooklyn

## 2021-03-09 ENCOUNTER — Ambulatory Visit: Payer: Medicare HMO

## 2021-03-09 ENCOUNTER — Other Ambulatory Visit: Payer: Self-pay

## 2021-03-09 ENCOUNTER — Ambulatory Visit (INDEPENDENT_AMBULATORY_CARE_PROVIDER_SITE_OTHER): Payer: Medicare HMO | Admitting: Orthopedic Surgery

## 2021-03-09 DIAGNOSIS — S42342A Displaced spiral fracture of shaft of humerus, left arm, initial encounter for closed fracture: Secondary | ICD-10-CM | POA: Insufficient documentation

## 2021-03-09 DIAGNOSIS — S42342D Displaced spiral fracture of shaft of humerus, left arm, subsequent encounter for fracture with routine healing: Secondary | ICD-10-CM

## 2021-03-09 NOTE — Progress Notes (Signed)
Chief Complaint  Patient presents with   FX CARE/left shoulder    DOI 02/21/21   Day 16 post injury patient has been in a coaptation splint  Comes in for x-ray which shows 15 degree angulation varus no angulation on lateral  Should be amenable to fracture cuff treatment  X-ray in 4 weeks  Radial nerve function intact wrist extension and thumb extension MP joint extension normal

## 2021-04-03 DIAGNOSIS — M65332 Trigger finger, left middle finger: Secondary | ICD-10-CM | POA: Insufficient documentation

## 2021-04-03 DIAGNOSIS — M19041 Primary osteoarthritis, right hand: Secondary | ICD-10-CM | POA: Diagnosis not present

## 2021-04-03 DIAGNOSIS — M65322 Trigger finger, left index finger: Secondary | ICD-10-CM | POA: Insufficient documentation

## 2021-04-06 ENCOUNTER — Ambulatory Visit (INDEPENDENT_AMBULATORY_CARE_PROVIDER_SITE_OTHER): Payer: Medicare HMO | Admitting: Orthopedic Surgery

## 2021-04-06 ENCOUNTER — Encounter: Payer: Self-pay | Admitting: Orthopedic Surgery

## 2021-04-06 ENCOUNTER — Other Ambulatory Visit: Payer: Self-pay

## 2021-04-06 ENCOUNTER — Ambulatory Visit: Payer: Medicare HMO

## 2021-04-06 DIAGNOSIS — S42342D Displaced spiral fracture of shaft of humerus, left arm, subsequent encounter for fracture with routine healing: Secondary | ICD-10-CM

## 2021-04-06 NOTE — Patient Instructions (Signed)
EXERCISES AS INSTRUCTED

## 2021-04-06 NOTE — Progress Notes (Signed)
Chief Complaint  Patient presents with   Arm Injury    Fracture humerus    Encounter Diagnosis  Name Primary?   Closed displaced spiral fracture of shaft of left humerus with routine healing, subsequent encounter Yes    Kelli Summers is improving.  She had a fracture of her left humerus back in August he is 6 weeks out from that injury she is in a fracture brace now  She is showing good movement of her arm with abduction up to 45 degrees normal elbow function normal wrist extension MP joint extension and thumb extension.  Elbow flexion 95 to 100 degrees  She has no motion at the fracture site  The x-ray shows 13 to 15 degrees of angulation in the AP plane and no angulation in the lateral plane I do not see a callus but the fracture does not appear to be moving on palpation  Recommend pendulums elbow flexion exercises hand exercises and an x-ray in 4 weeks continue fracture bracing

## 2021-04-30 DIAGNOSIS — F329 Major depressive disorder, single episode, unspecified: Secondary | ICD-10-CM | POA: Diagnosis not present

## 2021-04-30 DIAGNOSIS — I10 Essential (primary) hypertension: Secondary | ICD-10-CM | POA: Diagnosis not present

## 2021-04-30 DIAGNOSIS — E6609 Other obesity due to excess calories: Secondary | ICD-10-CM | POA: Diagnosis not present

## 2021-04-30 DIAGNOSIS — E1165 Type 2 diabetes mellitus with hyperglycemia: Secondary | ICD-10-CM | POA: Diagnosis not present

## 2021-04-30 DIAGNOSIS — Z6836 Body mass index (BMI) 36.0-36.9, adult: Secondary | ICD-10-CM | POA: Diagnosis not present

## 2021-04-30 DIAGNOSIS — E782 Mixed hyperlipidemia: Secondary | ICD-10-CM | POA: Diagnosis not present

## 2021-05-01 DIAGNOSIS — M65322 Trigger finger, left index finger: Secondary | ICD-10-CM | POA: Diagnosis not present

## 2021-05-01 DIAGNOSIS — M65332 Trigger finger, left middle finger: Secondary | ICD-10-CM | POA: Diagnosis not present

## 2021-05-01 DIAGNOSIS — G5601 Carpal tunnel syndrome, right upper limb: Secondary | ICD-10-CM | POA: Diagnosis not present

## 2021-05-01 DIAGNOSIS — G5600 Carpal tunnel syndrome, unspecified upper limb: Secondary | ICD-10-CM | POA: Insufficient documentation

## 2021-05-04 ENCOUNTER — Other Ambulatory Visit: Payer: Self-pay

## 2021-05-04 ENCOUNTER — Ambulatory Visit (INDEPENDENT_AMBULATORY_CARE_PROVIDER_SITE_OTHER): Payer: Medicare HMO | Admitting: Orthopedic Surgery

## 2021-05-04 ENCOUNTER — Ambulatory Visit: Payer: Medicare HMO

## 2021-05-04 DIAGNOSIS — S42342D Displaced spiral fracture of shaft of humerus, left arm, subsequent encounter for fracture with routine healing: Secondary | ICD-10-CM

## 2021-05-04 NOTE — Progress Notes (Signed)
Chief Complaint  Patient presents with   Arm Pain    Closed displaced spiral fracture of shaft of left humerus DOI 02/21/21    10 weeks fracture left humerus  Patient has numbness in the ulnar nerve distribution but no motor dysfunction.  Radial nerve function intact with good wrist extension finger extension at the MP joint and thumb extension  No movement at the fracture site as noted  Patient's pain is well controlled  She is able to do pendulum exercises as normal elbow flexion and normal finger abduction  X-ray shows 19 degrees of angulation on the AP view none on the lateral view.  No callus seen at this time although the fracture has been stable  Recommend continue pendulum exercises x-ray in 6 weeks

## 2021-05-19 ENCOUNTER — Ambulatory Visit (INDEPENDENT_AMBULATORY_CARE_PROVIDER_SITE_OTHER): Payer: Medicare HMO | Admitting: Physician Assistant

## 2021-05-19 ENCOUNTER — Ambulatory Visit (INDEPENDENT_AMBULATORY_CARE_PROVIDER_SITE_OTHER)
Admission: RE | Admit: 2021-05-19 | Discharge: 2021-05-19 | Disposition: A | Discharge status: Home / Self Care | Payer: Medicare HMO | Source: Ambulatory Visit | Attending: Vascular Surgery | Admitting: Vascular Surgery

## 2021-05-19 ENCOUNTER — Other Ambulatory Visit: Payer: Self-pay

## 2021-05-19 ENCOUNTER — Ambulatory Visit (HOSPITAL_COMMUNITY)
Admission: RE | Admit: 2021-05-19 | Discharge: 2021-05-19 | Disposition: A | Discharge status: Home / Self Care | Payer: Medicare HMO | Source: Ambulatory Visit | Attending: Vascular Surgery | Admitting: Vascular Surgery

## 2021-05-19 VITALS — BP 137/84 | HR 82 | Temp 97.5°F | Resp 20 | Ht 64.0 in | Wt 210.9 lb

## 2021-05-19 DIAGNOSIS — I6529 Occlusion and stenosis of unspecified carotid artery: Secondary | ICD-10-CM | POA: Diagnosis not present

## 2021-05-19 DIAGNOSIS — I70219 Atherosclerosis of native arteries of extremities with intermittent claudication, unspecified extremity: Secondary | ICD-10-CM | POA: Diagnosis not present

## 2021-05-19 NOTE — Progress Notes (Signed)
[facility] HISTORY AND PHYSICAL   History of Present Illness:  Patient is a 65 y.o. year old female who presents for evaluation of claudication.  She has a history of very short distance claudication prior to this visit.  She states she can walk to her mail box without calf pain, denise rest pain, or non healing wounds.  She quit smoking and states she feels much better.  She has a history of carotid stenosis.  She denise amaurosis, weakness and aphasia.      Past Medical History:  Diagnosis Date   Arthritis    Chronic back pain    Chronic knee pain    Depression    Diabetes mellitus    Ejection fraction    History of stress test 01/2010   normal myoview stress test   Hypertension    Sinus tachycardia     Past Surgical History:  Procedure Laterality Date   ABDOMINAL HYSTERECTOMY     BACK SURGERY     CATARACT EXTRACTION W/PHACO Right 01/12/2019   Procedure: CATARACT EXTRACTION PHACO AND INTRAOCULAR LENS PLACEMENT RIGHT EYE;  Surgeon: Baruch Goldmann, MD;  Location: [facility];  Service: Ophthalmology;  Laterality: Right;  right   CATARACT EXTRACTION W/PHACO Left 01/26/2019   Procedure: CATARACT EXTRACTION PHACO AND INTRAOCULAR LENS PLACEMENT (IOC);  Surgeon: Baruch Goldmann, MD;  Location: [facility];  Service: Ophthalmology;  Laterality: Left;  CDE: 7.07   CHOLECYSTECTOMY     COLONOSCOPY N/A 04/15/2015   Procedure: COLONOSCOPY;  Surgeon: Aviva Signs, MD;  Location: [facility];  Service: Gastroenterology;  Laterality: N/A;   CYST EXCISION Left 04/02/2016   Procedure: EXCISION 1 CM CYST ON LEFT SIDE OF FACE;  Surgeon: Aviva Signs, MD;  Location: [facility];  Service: General;  Laterality: Left;   VIDEO ASSISTED THORACOSCOPY (VATS)/DECORTICATION Right 05/09/2013   Procedure: VIDEO ASSISTED THORACOSCOPY (VATS)/DECORTICATION;  Surgeon: Melrose Nakayama, MD;  Location: [facility];  Service: Thoracic;  Laterality: Right;   WEDGE RESECTION Right 05/09/2013   Procedure:  WEDGE RESECTION RIGHT LOWER LOBE;  Surgeon: Melrose Nakayama, MD;  Location: [facility];  Service: Thoracic;  Laterality: Right;    ROS:   General:  No weight loss, Fever, chills  HEENT: No recent headaches, no nasal bleeding, no visual changes, no sore throat  Neurologic: No dizziness, blackouts, seizures. No recent symptoms of stroke or mini- stroke. No recent episodes of slurred speech, or temporary blindness.  Cardiac: No recent episodes of chest pain/pressure, no shortness of breath at rest.  No shortness of breath with exertion.  Denies history of atrial fibrillation or irregular heartbeat  Vascular: No history of rest pain in feet.  No history of claudication.  No history of non-healing ulcer, No history of DVT   Pulmonary: No home oxygen, no productive cough, no hemoptysis,  No asthma or wheezing  Musculoskeletal:  _0  Arthritis, _1  Low back pain,  _2  Joint pain  Hematologic:No history of hypercoagulable state.  No history of easy bleeding.  No history of anemia  Gastrointestinal: No hematochezia or melena,  No gastroesophageal reflux, no trouble swallowing  Urinary: _3  chronic Kidney disease, _4  on HD - _5  MWF or _6  TTHS, _7  Burning with urination, _8  Frequent urination, _9  Difficulty urinating;   Skin: No rashes  Psychological: No history of anxiety,  No history of depression  Social History Social History   Tobacco Use   Smoking status: Former  Packs/day: 0.50    Years: 40.00    Pack years: 20.00    Types: Cigarettes    Quit date: 04/28/2012    Years since quitting: 9.0   Smokeless tobacco: Former  Scientific laboratory technician Use: Never used  Substance Use Topics   Alcohol use: No   Drug use: No    Family History Family History  Problem Relation Age of Onset   Emphysema Maternal Grandfather        smoked   Breast cancer Maternal Aunt     Allergies  Allergies  Allergen Reactions   Penicillins Hives, Shortness Of Breath and Itching    Tolerates  Ceftin Has patient had a PCN reaction causing immediate rash, facial/tongue/throat swelling, SOB or lightheadedness with hypotension: Yes Has patient had a PCN reaction causing severe rash involving mucus membranes or skin necrosis: No Has patient had a PCN reaction that required hospitalization No Has patient had a PCN reaction occurring within the last 10 years: No If all of the above answers are "NO", then may proceed with Cephalosporin use.    Ciprofloxacin Hives and Itching   Hydrocodone-Acetaminophen Itching    Pt can take acetaminophen   Percocet [Oxycodone-Acetaminophen] Itching   Biotin Itching and Rash     Current Outpatient Medications  Medication Sig Dispense Refill   albuterol (VENTOLIN HFA) 108 (90 Base) MCG/ACT inhaler      Blood Glucose Monitoring Suppl (TRUE METRIX METER) w/Device KIT      EPINEPHrine (PRIMATENE MIST IN) Inhale 2 puffs into the lungs 3 (three) times daily as needed (shortness of breath).     fluticasone (FLONASE) 50 MCG/ACT nasal spray Place into both nostrils.     furosemide (LASIX) 20 MG tablet Take 20 mg by mouth daily.     glipiZIDE (GLUCOTROL) 10 MG tablet Take 10 mg by mouth 2 (two) times daily.     LANTUS SOLOSTAR 100 UNIT/ML Solostar Pen Inject 50 Units into the skin at bedtime.     losartan (COZAAR) 100 MG tablet Take 100 mg by mouth daily.  0   meloxicam (MOBIC) 15 MG tablet Take 15 mg by mouth daily.     metoprolol tartrate (LOPRESSOR) 25 MG tablet Take 25 mg by mouth 2 (two) times daily.     MOUNJARO 2.5 MG/0.5ML Pen Inject 2.5 mg into the skin once a week.     pregabalin (LYRICA) 50 MG capsule Take 50 mg by mouth in the morning and at bedtime.     TRUE METRIX BLOOD GLUCOSE TEST test strip SMARTSIG:Via Meter     TRUEplus Lancets 33G MISC      Vitamin D, Ergocalciferol, (DRISDOL) 1.25 MG (50000 UNIT) CAPS capsule Take 50,000 Units by mouth once a week.     No current facility-administered medications for this visit.    Physical  Examination  Vitals:   05/19/21 0918  BP: 137/84  Pulse: 82  Resp: 20  Temp: (!) 97.5 F (36.4 C)  TempSrc: Temporal  SpO2: 97%  Weight: 210 lb 14.4 oz (95.7 kg)  Height: _0  (1.626 m)    Body mass index is 36.2 kg/m.  General:  Alert and oriented, no acute distress HEENT: Normal Neck: No bruit or JVD Pulmonary: Clear to auscultation bilaterally Cardiac: Regular Rate and Rhythm without murmur Abdomen: Soft, non-tender, non-distended, no mass, no scars Skin: No rash Extremity Pulses:  2+ radial, brachial, femoral, dorsalis pedis, posterior tibial pulses bilaterally Musculoskeletal: No deformity or edema  Neurologic: Upper and lower  extremity motor 5/5 and symmetric  DATA:   +-------+-----------+-----------+------------+------------+  ABI/TBIToday's ABIToday's TBIPrevious ABIPrevious TBI  +-------+-----------+-----------+------------+------------+  Right  0.62       0.28       0.54        0.40          +-------+-----------+-----------+------------+------------+  Left   0.64       0.30       0.54        0.33          +-------+-----------+-----------+------------+------------+          Right ABIs appear essentially unchanged compared to prior study on  09/05/2020. Left ABIs appear increased compared to prior study on  09/05/2020.     Summary:  Right: Resting right ankle-brachial index indicates moderate right lower  extremity arterial disease. The right toe-brachial index is abnormal.   Left: Resting left ankle-brachial index indicates moderate left lower  extremity arterial disease. The left toe-brachial index is abnormal.      Right Carotid Findings:  +----------+--------+--------+--------+--------------------------+--------+             PSV cm/sEDV cm/sStenosisPlaque Description          Comments  +----------+--------+--------+--------+--------------------------+--------+   CCA Prox  117     30                                                     +----------+--------+--------+--------+--------------------------+--------+   CCA Mid   94      28                                                    +----------+--------+--------+--------+--------------------------+--------+   CCA Distal116     33      <50%    irregular and heterogenous            +----------+--------+--------+--------+--------------------------+--------+   ICA Prox  166     53      40-59%  irregular and heterogenous            +----------+--------+--------+--------+--------------------------+--------+   ICA Mid   158     46                                                    +----------+--------+--------+--------+--------------------------+--------+   ICA Distal105     28                                                    +----------+--------+--------+--------+--------------------------+--------+   ECA       168     27                                                    +----------+--------+--------+--------+--------------------------+--------+    +----------+--------+-------+----------------+-------------------+  PSV cm/sEDV cmsDescribe        Arm Pressure (mmHG)  +----------+--------+-------+----------------+-------------------+  Subclavian70      0      Multiphasic, WNL                     +----------+--------+-------+----------------+-------------------+   +---------+--------+--+--------+--+---------+  VertebralPSV cm/s65EDV cm/s14Antegrade  +---------+--------+--+--------+--+---------+       Left Carotid Findings:  +----------+--------+--------+--------+------------------+--------+            PSV cm/sEDV cm/sStenosisPlaque DescriptionComments  +----------+--------+--------+--------+------------------+--------+  CCA Prox  110     24                                          +----------+--------+--------+--------+------------------+--------+  CCA  Mid   88      23                                          +----------+--------+--------+--------+------------------+--------+  CCA Distal76      25                                          +----------+--------+--------+--------+------------------+--------+  ICA Prox  115     40      1-39%   irregular                   +----------+--------+--------+--------+------------------+--------+  ICA Mid   97      33                                          +----------+--------+--------+--------+------------------+--------+  ICA Distal93      33                                          +----------+--------+--------+--------+------------------+--------+  ECA       210     31      >50%                                +----------+--------+--------+--------+------------------+--------+   +----------+--------+--------+----------------+-------------------+            PSV cm/sEDV cm/sDescribe        Arm Pressure (mmHG)  +----------+--------+--------+----------------+-------------------+  [id number]      0       Multiphasic, WNL                     +----------+--------+--------+----------------+-------------------+   +---------+--------+--+--------+--+---------+  VertebralPSV cm/s69EDV cm/s20Antegrade  +---------+--------+--+--------+--+---------+        Summary:  Right Carotid: Velocities in the right ICA are consistent with a 40-59%                 stenosis. Non-hemodynamically significant plaque <50% noted  in                 the CCA.   Left Carotid: Velocities in the left ICA are consistent with a 1-39%  stenosis.  The ECA appears >50% stenosed.   Vertebrals:  Bilateral vertebral arteries demonstrate antegrade flow.  Subclavians: Normal flow hemodynamics were seen in bilateral subclavian               arteries.    ASSESSMENT/PLAN:  PAD with history of claudication at short distances.  He walking distance has  improved and she stopped smoking.  She will start a true walking program and increase her distance as she tolerates.    Her ABI's are stable and she has no rest pain or non healing wounds.    Asymptomatic Carotid stenosis that is stable and unchanged by duplex.  We discussed symptoms of stroke/TIA, rest pain and non healing wounds she will call 911 if she has stroke symptoms and the office if she has symptoms of ischemia.    F/U 6 months ABI's and 1 year for carotid duplex        Roxy Horseman PA-C [facility] Office: [phone]   MD in clinic [place]

## 2021-05-25 ENCOUNTER — Other Ambulatory Visit: Payer: Self-pay

## 2021-05-25 DIAGNOSIS — I70219 Atherosclerosis of native arteries of extremities with intermittent claudication, unspecified extremity: Secondary | ICD-10-CM

## 2021-06-15 ENCOUNTER — Ambulatory Visit: Payer: Medicare HMO

## 2021-06-15 ENCOUNTER — Ambulatory Visit (INDEPENDENT_AMBULATORY_CARE_PROVIDER_SITE_OTHER): Payer: Medicare HMO | Admitting: Orthopedic Surgery

## 2021-06-15 ENCOUNTER — Other Ambulatory Visit: Payer: Self-pay

## 2021-06-15 DIAGNOSIS — S42342D Displaced spiral fracture of shaft of humerus, left arm, subsequent encounter for fracture with routine healing: Secondary | ICD-10-CM

## 2021-06-15 NOTE — Progress Notes (Signed)
Chief Complaint  Patient presents with   Follow-up    Recheck on left humerus, DOI 02-21-21.   65 year old female with left humerus shaft fracture she is in a fracture cuff she has no pain has not really had any pain since she did this  She appears to have a fibrous union developing  She still has motion at the fracture site but it is painless  Her x-ray shows some angulation at the fracture site on 1 view but almost linear alignment on the other view  She has no radial nerve dysfunction although she has occasional numbness and tingling on the dorsum of the wrist  At this point I think it still okay to continue with the fracture cuff maybe will get some callus although I advised her this may take up to 6 months ,   x-rays again 1 month  Encounter Diagnosis  Name Primary?   Closed displaced spiral fracture of shaft of left humerus with routine healing, subsequent encounter 02/21/21 Yes

## 2021-07-15 ENCOUNTER — Other Ambulatory Visit: Payer: Self-pay

## 2021-07-15 ENCOUNTER — Ambulatory Visit: Payer: Medicare HMO

## 2021-07-15 ENCOUNTER — Encounter: Payer: Self-pay | Admitting: Orthopedic Surgery

## 2021-07-15 ENCOUNTER — Ambulatory Visit: Payer: Medicare HMO | Admitting: Orthopedic Surgery

## 2021-07-15 DIAGNOSIS — S42342D Displaced spiral fracture of shaft of humerus, left arm, subsequent encounter for fracture with routine healing: Secondary | ICD-10-CM

## 2021-07-15 DIAGNOSIS — S42342K Displaced spiral fracture of shaft of humerus, left arm, subsequent encounter for fracture with nonunion: Secondary | ICD-10-CM

## 2021-07-15 NOTE — Progress Notes (Signed)
Follow-up left midshaft humerus fracture treated with fracture brace   Chief Complaint  Patient presents with   fracture care w/ xray    DOI 02/21/21    66 year old female history of asthma COPD diabetes occasional tachycardia treated with beta-blocker presents with persistent motion at the fracture site of a midshaft humerus fracture with intact radial nerve  After reviewing the x-rays with the patient talking about the course of this fracture we decided to perform ORIF of the left humerus   The procedure has been fully reviewed with the patient; The risks and benefits of surgery have been discussed and explained and understood. Alternative treatment has also been reviewed, questions were encouraged and answered. The postoperative plan is also been reviewed.   Encounter Diagnosis  Name Primary?   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

## 2021-07-15 NOTE — Patient Instructions (Signed)
Your surgery will be at [facility] by Dr Harrison  °The hospital will contact you with a preoperative appointment to discuss Anesthesia. The phone number is [phone] ° Please bring your medications with you for the appointment. °They will tell you the arrival time and medication instructions when you have your preoperative evaluation. °Do not wear nail polish the day of your surgery and if you take Phentermine you need to stop this medication ONE WEEK prior to your surgery. ° °

## 2021-07-15 NOTE — H&P (View-Only) (Signed)
Follow-up left midshaft humerus fracture treated with fracture brace  ° °Chief Complaint  °Patient presents with  ° fracture care w/ xray  °  DOI 02/21/21  ° ° °65-year-old female history of asthma COPD diabetes occasional tachycardia treated with beta-blocker presents with persistent motion at the fracture site of a midshaft humerus fracture with intact radial nerve ° °After reviewing the x-rays with the patient talking about the course of this fracture we decided to perform ORIF of the left humerus  ° °The procedure has been fully reviewed with the patient; The risks and benefits of surgery have been discussed and explained and understood. Alternative treatment has also been reviewed, questions were encouraged and answered. The postoperative plan is also been reviewed. ° ° °Encounter Diagnosis  °Name Primary?  ° Closed displaced spiral fracture of shaft of left humerus with nonunion, subsequent encounter Yes  ° ° ° °

## 2021-07-16 ENCOUNTER — Other Ambulatory Visit: Payer: Self-pay | Admitting: Orthopedic Surgery

## 2021-07-16 DIAGNOSIS — S42342K Displaced spiral fracture of shaft of humerus, left arm, subsequent encounter for fracture with nonunion: Secondary | ICD-10-CM

## 2021-07-21 NOTE — Patient Instructions (Signed)
Ansel BongVicky P Scantlin  07/21/2021     @PREFPERIOPPHARMACY @   Your procedure is scheduled on  07/28/2021.   [facility]  [phone]  A.M.   Call this number if you have problems the morning of surgery:  [phone]   Remember:  Do not eat or drink after midnight.     Take 25 units of Lantus the night before your procedure.    DO NOT take any medications for diabetes the morning of your procedure.     Use you inhaler before you come and bring your rescue inhaler with you.       Take these medicines the morning of surgery with A SIP OF WATER                              mobic,metoprolol, lyrica.     Do not wear jewelry, make-up or nail polish.  Do not wear lotions, powders, or perfumes, or deodorant.  Do not shave 48 hours prior to surgery.  Men may shave face and neck.  Do not bring valuables to the hospital.  [facility] any belongings or valuables.  Contacts, dentures or bridgework may not be worn into surgery.  Leave your suitcase in the car.  After surgery it may be brought to your room.  For patients admitted to the hospital, discharge time will be determined by your treatment team.  Patients discharged the day of surgery will not be allowed to drive home and must have someone with them for 24 hours.    Special instructions:   DO NOT smoke tobacco or vape for 24 hours before your procedure.  Please read over the following fact sheets that you were given. Coughing and Deep Breathing, Surgical Site Infection Prevention, Anesthesia Post-op Instructions, and Care and Recovery After Surgery      Humerus Fracture Treated With ORIF, Care After The following information offers guidance on how to care for yourself after your procedure. Your health care provider may also give you more specific instructions. If you have problems or questions, contact your health care provider. What can I expect after the procedure? After the procedure,  it is common to have: Pain. Swelling. Stiffness. A small amount of fluid from the incision. Follow these instructions at home: Medicines Take over-the-counter and prescription medicines only as told by your health care provider. Ask your health care provider if the medicine prescribed to you: Requires you to avoid driving or using machinery. Can cause constipation. You may need to take these actions to prevent or treat constipation: Drink enough fluid to keep your urine pale yellow. Take over-the-counter or prescription medicines. Eat foods that are high in fiber, such as beans, whole grains, and fresh fruits and vegetables. Limit foods that are high in fat and processed sugars, such as fried or sweet foods. If you have a sling or other removable immobilizer: Wear the sling or immobilizer as told by your health care provider. Remove it only as told by your health care provider. Check the skin around the sling or immobilizer every day. Tell your health care provider about any concerns. Loosen the sling or immobilizer if your fingers tingle, become numb, or turn cold and blue. Keep the sling or immobilizer clean and dry. Bathing Do not take baths, swim, or use a hot tub until your health care provider approves. Ask your health care  provider if you may take showers. You may only be allowed to take sponge baths. If the sling or immobilizer is not waterproof: Do not let it get wet. Remove it when you take a bath or a shower, but leave your arm at your side. Incision care  Follow instructions from your health care provider about how to take care of your incision. Make sure you: Wash your hands with soap and water for at least 20 seconds before and after you change your bandage (dressing). If soap and water are not available, use hand sanitizer. Change your dressing as told by your health care provider. Leave stitches (sutures), staples, skin glue, or adhesive strips in place. These skin  closures may need to stay in place for 2 weeks or longer. If adhesive strip edges start to loosen and curl up, you may trim the loose edges. Do not remove adhesive strips completely unless your health care provider tells you to do that. Keep the dressing dry until your health care provider says it can be removed. Check your incision area every day for signs of infection. Check for: Redness. More swelling or pain. Blood or more fluid. Warmth. Pus or a bad smell. Managing pain, stiffness, and swelling  If directed, put ice on the affected area. To do this: If you have a sling or other removable immobilizer, remove it as told by your health care provider. Put ice in a plastic bag. Place a towel between your skin and the bag. Leave the ice on for 20 minutes, 2-3 times a day. Remove the ice if your skin turns bright red. This is very important. If you cannot feel pain, heat, or cold, you have a greater risk of damage to the area. Move your fingers often to reduce stiffness and swelling. Raise (elevate) the injured area above the level of your heart while you are sitting or lying down. To do this, try putting a few pillows under your arm. Activity Ask your health care provider when it is safe to drive if you have a sling or immobilizer on your shoulder and arm. Return to your normal activities as told by your health care provider. Ask your health care provider what activities are safe for you. Do exercises as told by your health care provider. Do not lift with your injured arm until your health care provider approves. Avoid pulling and pushing. Follow instructions from your health care provider about: Whether you may gently use your hand and elbow immediately after surgery. When to start doing gentle range-of-motion movements with your shoulder and elbow. It is important to do these movements to prevent loss of motion. General instructions Do not use any products that contain nicotine or  tobacco. These products include cigarettes, chewing tobacco, and vaping devices, such as e-cigarettes. These can delay bone and incision healing. If you need help quitting, ask your health care provider. Keep all follow-up visits. This is important. Contact a health care provider if: You have pain that is not helped by medicine. You have a fever. You have any signs of infection, such as: Redness, more swelling, or more pain around your incision. Blood or more fluid coming from your incision. Warmth coming from your incision. Pus or a bad smell coming from your incision. You have numbness or tingling in your hand or fingers. Get help right away if: You have severe pain. The edges of your incision come apart after the stitches or staples have been taken out. You have trouble breathing. These  symptoms may represent a serious problem that is an emergency. Do not wait to see if the symptoms will go away. Get medical help right away. Call your local emergency services (911 in the [place] Do not drive yourself to the hospital. Summary After this procedure, it is common to have some pain, swelling, or stiffness. If your sling or immobilizer is not waterproof, do not let it get wet. Contact your health care provider if you have blood or more fluid coming from your incision. Get help right away if the edges of your incision come apart after the stitches or staples have been taken out. This information is not intended to replace advice given to you by your health care provider. Make sure you discuss any questions you have with your health care provider. Document Revised: 10/08/2020 Document Reviewed: 10/08/2020 Elsevier Patient Education  2022 [employer] General Anesthesia, Adult, Care After This sheet gives you information about how to care for yourself after your procedure. Your health care provider may also give you more specific instructions. If you have problems or questions, contact your  health care provider. What can I expect after the procedure? After the procedure, the following side effects are common: Pain or discomfort at the IV site. Nausea. Vomiting. Sore throat. Trouble concentrating. Feeling cold or chills. Feeling weak or tired. Sleepiness and fatigue. Soreness and body aches. These side effects can affect parts of the body that were not involved in surgery. Follow these instructions at home: For the time period you were told by your health care provider:  Rest. Do not participate in activities where you could fall or become injured. Do not drive or use machinery. Do not drink alcohol. Do not take sleeping pills or medicines that cause drowsiness. Do not make important decisions or sign legal documents. Do not take care of children on your own. Eating and drinking Follow any instructions from your health care provider about eating or drinking restrictions. When you feel hungry, start by eating small amounts of foods that are soft and easy to digest (bland), such as toast. Gradually return to your regular diet. Drink enough fluid to keep your urine pale yellow. If you vomit, rehydrate by drinking water, juice, or clear broth. General instructions If you have sleep apnea, surgery and certain medicines can increase your risk for breathing problems. Follow instructions from your health care provider about wearing your sleep device: Anytime you are sleeping, including during daytime naps. While taking prescription pain medicines, sleeping medicines, or medicines that make you drowsy. Have a responsible adult stay with you for the time you are told. It is important to have someone help care for you until you are awake and alert. Return to your normal activities as told by your health care provider. Ask your health care provider what activities are safe for you. Take over-the-counter and prescription medicines only as told by your health care provider. If you  smoke, do not smoke without supervision. Keep all follow-up visits as told by your health care provider. This is important. Contact a health care provider if: You have nausea or vomiting that does not get better with medicine. You cannot eat or drink without vomiting. You have pain that does not get better with medicine. You are unable to pass urine. You develop a skin rash. You have a fever. You have redness around your IV site that gets worse. Get help right away if: You have difficulty breathing. You have chest pain. You have blood in your  urine or stool, or you vomit blood. Summary After the procedure, it is common to have a sore throat or nausea. It is also common to feel tired. Have a responsible adult stay with you for the time you are told. It is important to have someone help care for you until you are awake and alert. When you feel hungry, start by eating small amounts of foods that are soft and easy to digest (bland), such as toast. Gradually return to your regular diet. Drink enough fluid to keep your urine pale yellow. Return to your normal activities as told by your health care provider. Ask your health care provider what activities are safe for you. This information is not intended to replace advice given to you by your health care provider. Make sure you discuss any questions you have with your health care provider. Document Revised: 02/28/2020 Document Reviewed: 09/27/2019 Elsevier Patient Education  2022 Else[employer] Inc. How to Use Chlorhexidine for Bathing Chlorhexidine gluconate (CHG) is a germ-killing (antiseptic) solution that is used to clean the skin. It can get rid of the bacteria that normally live on the skin and can keep them away for about 24 hours. To clean your skin with CHG, you may be given: A CHG solution to use in the shower or as part of a sponge bath. A prepackaged cloth that contains CHG. Cleaning your skin with CHG may help lower the risk for  infection: While you are staying in the intensive care unit of the hospital. If you have a vascular access, such as a central line, to provide short-term or long-term access to your veins. If you have a catheter to drain urine from your bladder. If you are on a ventilator. A ventilator is a machine that helps you breathe by moving air in and out of your lungs. After surgery. What are the risks? Risks of using CHG include: A skin reaction. Hearing loss, if CHG gets in your ears and you have a perforated eardrum. Eye injury, if CHG gets in your eyes and is not rinsed out. The CHG product catching fire. Make sure that you avoid smoking and flames after applying CHG to your skin. Do not use CHG: If you have a chlorhexidine allergy or have previously reacted to chlorhexidine. On babies younger than 36 months of age. How to use CHG solution Use CHG only as told by your health care provider, and follow the instructions on the label. Use the full amount of CHG as directed. Usually, this is one bottle. During a shower Follow these steps when using CHG solution during a shower (unless your health care provider gives you different instructions): Start the shower. Use your normal soap and shampoo to wash your face and hair. Turn off the shower or move out of the shower stream. Pour the CHG onto a clean washcloth. Do not use any type of brush or rough-edged sponge. Starting at your neck, lather your body down to your toes. Make sure you follow these instructions: If you will be having surgery, pay special attention to the part of your body where you will be having surgery. Scrub this area for at least 1 minute. Do not use CHG on your head or face. If the solution gets into your ears or eyes, rinse them well with water. Avoid your genital area. Avoid any areas of skin that have broken skin, cuts, or scrapes. Scrub your back and under your arms. Make sure to wash skin folds. Let the lather sit on  your  skin for 1-2 minutes or as long as told by your health care provider. Thoroughly rinse your entire body in the shower. Make sure that all body creases and crevices are rinsed well. Dry off with a clean towel. Do not put any substances on your body afterward--such as powder, lotion, or perfume--unless you are told to do so by your health care provider. Only use lotions that are recommended by the manufacturer. Put on clean clothes or pajamas. If it is the night before your surgery, sleep in clean sheets.  During a sponge bath Follow these steps when using CHG solution during a sponge bath (unless your health care provider gives you different instructions): Use your normal soap and shampoo to wash your face and hair. Pour the CHG onto a clean washcloth. Starting at your neck, lather your body down to your toes. Make sure you follow these instructions: If you will be having surgery, pay special attention to the part of your body where you will be having surgery. Scrub this area for at least 1 minute. Do not use CHG on your head or face. If the solution gets into your ears or eyes, rinse them well with water. Avoid your genital area. Avoid any areas of skin that have broken skin, cuts, or scrapes. Scrub your back and under your arms. Make sure to wash skin folds. Let the lather sit on your skin for 1-2 minutes or as long as told by your health care provider. Using a different clean, wet washcloth, thoroughly rinse your entire body. Make sure that all body creases and crevices are rinsed well. Dry off with a clean towel. Do not put any substances on your body afterward--such as powder, lotion, or perfume--unless you are told to do so by your health care provider. Only use lotions that are recommended by the manufacturer. Put on clean clothes or pajamas. If it is the night before your surgery, sleep in clean sheets. How to use CHG prepackaged cloths Only use CHG cloths as told by your health care  provider, and follow the instructions on the label. Use the CHG cloth on clean, dry skin. Do not use the CHG cloth on your head or face unless your health care provider tells you to. When washing with the CHG cloth: Avoid your genital area. Avoid any areas of skin that have broken skin, cuts, or scrapes. Before surgery Follow these steps when using a CHG cloth to clean before surgery (unless your health care provider gives you different instructions): Using the CHG cloth, vigorously scrub the part of your body where you will be having surgery. Scrub using a back-and-forth motion for 3 minutes. The area on your body should be completely wet with CHG when you are done scrubbing. Do not rinse. Discard the cloth and let the area air-dry. Do not put any substances on the area afterward, such as powder, lotion, or perfume. Put on clean clothes or pajamas. If it is the night before your surgery, sleep in clean sheets.  For general bathing Follow these steps when using CHG cloths for general bathing (unless your health care provider gives you different instructions). Use a separate CHG cloth for each area of your body. Make sure you wash between any folds of skin and between your fingers and toes. Wash your body in the following order, switching to a new cloth after each step: The front of your neck, shoulders, and chest. Both of your arms, under your arms, and your hands. Your  stomach and groin area, avoiding the genitals. Your right leg and foot. Your left leg and foot. The back of your neck, your back, and your buttocks. Do not rinse. Discard the cloth and let the area air-dry. Do not put any substances on your body afterward--such as powder, lotion, or perfume--unless you are told to do so by your health care provider. Only use lotions that are recommended by the manufacturer. Put on clean clothes or pajamas. Contact a health care provider if: Your skin gets irritated after scrubbing. You have  questions about using your solution or cloth. You swallow any chlorhexidine. Call your local poison control center (563-671-4848 in the [place] Get help right away if: Your eyes itch badly, or they become very red or swollen. Your skin itches badly and is red or swollen. Your hearing changes. You have trouble seeing. You have swelling or tingling in your mouth or throat. You have trouble breathing. These symptoms may represent a serious problem that is an emergency. Do not wait to see if the symptoms will go away. Get medical help right away. Call your local emergency services (911 in the [place] Do not drive yourself to the hospital. Summary Chlorhexidine gluconate (CHG) is a germ-killing (antiseptic) solution that is used to clean the skin. Cleaning your skin with CHG may help to lower your risk for infection. You may be given CHG to use for bathing. It may be in a bottle or in a prepackaged cloth to use on your skin. Carefully follow your health care provider's instructions and the instructions on the product label. Do not use CHG if you have a chlorhexidine allergy. Contact your health care provider if your skin gets irritated after scrubbing. This information is not intended to replace advice given to you by your health care provider. Make sure you discuss any questions you have with your health care provider. Document Revised: 08/25/2020 Document Reviewed: 08/25/2020 Elsevier Patient Education  2022 [employer].

## 2021-07-23 ENCOUNTER — Encounter (HOSPITAL_COMMUNITY)
Admission: RE | Admit: 2021-07-23 | Discharge: 2021-07-23 | Disposition: A | Discharge status: Home / Self Care | Payer: Medicare HMO | Source: Ambulatory Visit | Attending: Orthopedic Surgery | Admitting: Orthopedic Surgery

## 2021-07-23 ENCOUNTER — Encounter (HOSPITAL_COMMUNITY): Payer: Self-pay

## 2021-07-23 DIAGNOSIS — Z01818 Encounter for other preprocedural examination: Secondary | ICD-10-CM | POA: Insufficient documentation

## 2021-07-23 DIAGNOSIS — R7309 Other abnormal glucose: Secondary | ICD-10-CM

## 2021-07-23 DIAGNOSIS — X58XXXD Exposure to other specified factors, subsequent encounter: Secondary | ICD-10-CM | POA: Diagnosis not present

## 2021-07-23 DIAGNOSIS — E119 Type 2 diabetes mellitus without complications: Secondary | ICD-10-CM | POA: Diagnosis not present

## 2021-07-23 DIAGNOSIS — S42342K Displaced spiral fracture of shaft of humerus, left arm, subsequent encounter for fracture with nonunion: Secondary | ICD-10-CM

## 2021-07-23 DIAGNOSIS — Z794 Long term (current) use of insulin: Secondary | ICD-10-CM | POA: Diagnosis not present

## 2021-07-23 HISTORY — DX: Other complications of anesthesia, initial encounter: T88.59XA

## 2021-07-23 HISTORY — DX: Cardiac arrhythmia, unspecified: I49.9

## 2021-07-23 HISTORY — DX: Unspecified asthma, uncomplicated: J45.909

## 2021-07-23 LAB — CBC WITH DIFFERENTIAL/PLATELET
Abs Immature Granulocytes: 0.02 10*3/uL (ref 0.00–0.07)
Basophils Absolute: 0.1 10*3/uL (ref 0.0–0.1)
Basophils Relative: 1 %
Eosinophils Absolute: 0.5 10*3/uL (ref 0.0–0.5)
Eosinophils Relative: 6 %
HCT: 42.3 % (ref 36.0–46.0)
Hemoglobin: 13.8 g/dL (ref 12.0–15.0)
Immature Granulocytes: 0 %
Lymphocytes Relative: 20 %
Lymphs Abs: 1.7 10*3/uL (ref 0.7–4.0)
MCH: 30.1 pg (ref 26.0–34.0)
MCHC: 32.6 g/dL (ref 30.0–36.0)
MCV: 92.4 fL (ref 80.0–100.0)
Monocytes Absolute: 0.6 10*3/uL (ref 0.1–1.0)
Monocytes Relative: 6 %
Neutro Abs: 5.9 10*3/uL (ref 1.7–7.7)
Neutrophils Relative %: 67 %
Platelets: 176 10*3/uL (ref 150–400)
RBC: 4.58 MIL/uL (ref 3.87–5.11)
RDW: 13.6 % (ref 11.5–15.5)
WBC: 8.8 10*3/uL (ref 4.0–10.5)
nRBC: 0 % (ref 0.0–0.2)

## 2021-07-23 LAB — BASIC METABOLIC PANEL
Anion gap: 8 (ref 5–15)
BUN: 18 mg/dL (ref 8–23)
CO2: 23 mmol/L (ref 22–32)
Calcium: 8.4 mg/dL — ABNORMAL LOW (ref 8.9–10.3)
Chloride: 104 mmol/L (ref 98–111)
Creatinine, Ser: 1.34 mg/dL — ABNORMAL HIGH (ref 0.44–1.00)
GFR, Estimated: 44 mL/min — ABNORMAL LOW (ref >60)
Glucose, Bld: 290 mg/dL — ABNORMAL HIGH (ref 70–99)
Potassium: 4.5 mmol/L (ref 3.5–5.1)
Sodium: 135 mmol/L (ref 135–145)

## 2021-07-24 LAB — HEMOGLOBIN A1C
Hgb A1c MFr Bld: 6.9 % — ABNORMAL HIGH (ref 4.8–5.6)
Mean Plasma Glucose: 151 mg/dL

## 2021-07-28 ENCOUNTER — Ambulatory Visit (HOSPITAL_COMMUNITY): Payer: Medicare HMO | Admitting: Anesthesiology

## 2021-07-28 ENCOUNTER — Encounter (HOSPITAL_COMMUNITY): Payer: Self-pay | Admitting: Orthopedic Surgery

## 2021-07-28 ENCOUNTER — Ambulatory Visit (HOSPITAL_COMMUNITY): Payer: Medicare HMO

## 2021-07-28 ENCOUNTER — Encounter (HOSPITAL_COMMUNITY)
Admission: RE | Disposition: A | Discharge status: Home / Self Care | Payer: Self-pay | Source: Home / Self Care | Attending: Orthopedic Surgery

## 2021-07-28 ENCOUNTER — Other Ambulatory Visit (HOSPITAL_COMMUNITY): Payer: Self-pay | Admitting: Orthopedic Surgery

## 2021-07-28 ENCOUNTER — Other Ambulatory Visit: Payer: Self-pay

## 2021-07-28 ENCOUNTER — Ambulatory Visit (HOSPITAL_COMMUNITY)
Admission: RE | Admit: 2021-07-28 | Discharge: 2021-07-28 | Disposition: A | Discharge status: Home / Self Care | Payer: Medicare HMO | Attending: Orthopedic Surgery | Admitting: Orthopedic Surgery

## 2021-07-28 DIAGNOSIS — S42302A Unspecified fracture of shaft of humerus, left arm, initial encounter for closed fracture: Secondary | ICD-10-CM | POA: Diagnosis not present

## 2021-07-28 DIAGNOSIS — S42292S Other displaced fracture of upper end of left humerus, sequela: Secondary | ICD-10-CM

## 2021-07-28 DIAGNOSIS — E119 Type 2 diabetes mellitus without complications: Secondary | ICD-10-CM | POA: Diagnosis not present

## 2021-07-28 DIAGNOSIS — E1165 Type 2 diabetes mellitus with hyperglycemia: Secondary | ICD-10-CM | POA: Diagnosis not present

## 2021-07-28 DIAGNOSIS — Z87891 Personal history of nicotine dependence: Secondary | ICD-10-CM | POA: Insufficient documentation

## 2021-07-28 DIAGNOSIS — Z794 Long term (current) use of insulin: Secondary | ICD-10-CM | POA: Diagnosis not present

## 2021-07-28 DIAGNOSIS — Z7984 Long term (current) use of oral hypoglycemic drugs: Secondary | ICD-10-CM | POA: Insufficient documentation

## 2021-07-28 DIAGNOSIS — I10 Essential (primary) hypertension: Secondary | ICD-10-CM | POA: Diagnosis not present

## 2021-07-28 DIAGNOSIS — Y939 Activity, unspecified: Secondary | ICD-10-CM | POA: Diagnosis not present

## 2021-07-28 DIAGNOSIS — R000 Tachycardia, unspecified: Secondary | ICD-10-CM | POA: Diagnosis not present

## 2021-07-28 DIAGNOSIS — X58XXXA Exposure to other specified factors, initial encounter: Secondary | ICD-10-CM | POA: Diagnosis not present

## 2021-07-28 DIAGNOSIS — S42201D Unspecified fracture of upper end of right humerus, subsequent encounter for fracture with routine healing: Secondary | ICD-10-CM | POA: Diagnosis not present

## 2021-07-28 DIAGNOSIS — S42202D Unspecified fracture of upper end of left humerus, subsequent encounter for fracture with routine healing: Secondary | ICD-10-CM | POA: Diagnosis not present

## 2021-07-28 DIAGNOSIS — S42342K Displaced spiral fracture of shaft of humerus, left arm, subsequent encounter for fracture with nonunion: Secondary | ICD-10-CM | POA: Diagnosis not present

## 2021-07-28 DIAGNOSIS — S42309A Unspecified fracture of shaft of humerus, unspecified arm, initial encounter for closed fracture: Secondary | ICD-10-CM

## 2021-07-28 DIAGNOSIS — Z79899 Other long term (current) drug therapy: Secondary | ICD-10-CM | POA: Insufficient documentation

## 2021-07-28 DIAGNOSIS — S42302K Unspecified fracture of shaft of humerus, left arm, subsequent encounter for fracture with nonunion: Secondary | ICD-10-CM | POA: Diagnosis not present

## 2021-07-28 DIAGNOSIS — D649 Anemia, unspecified: Secondary | ICD-10-CM | POA: Insufficient documentation

## 2021-07-28 DIAGNOSIS — J449 Chronic obstructive pulmonary disease, unspecified: Secondary | ICD-10-CM | POA: Diagnosis not present

## 2021-07-28 DIAGNOSIS — S42352D Displaced comminuted fracture of shaft of humerus, left arm, subsequent encounter for fracture with routine healing: Secondary | ICD-10-CM | POA: Diagnosis not present

## 2021-07-28 HISTORY — PX: ORIF HUMERUS FRACTURE: SHX2126

## 2021-07-28 LAB — GLUCOSE, CAPILLARY
Glucose-Capillary: 138 mg/dL — ABNORMAL HIGH (ref 70–99)
Glucose-Capillary: 187 mg/dL — ABNORMAL HIGH (ref 70–99)

## 2021-07-28 SURGERY — OPEN REDUCTION INTERNAL FIXATION (ORIF) DISTAL HUMERUS FRACTURE
Anesthesia: General | Site: Arm Upper | Laterality: Left

## 2021-07-28 MED ORDER — VANCOMYCIN HCL IN DEXTROSE 1-5 GM/200ML-% IV SOLN
1000.0000 mg | INTRAVENOUS | Status: AC
  Administered 2021-07-28: 1000 mg via INTRAVENOUS
  Filled 2021-07-28: qty 200

## 2021-07-28 MED ORDER — DEXAMETHASONE SODIUM PHOSPHATE 10 MG/ML IJ SOLN
INTRAMUSCULAR | Status: AC
  Filled 2021-07-28: qty 1

## 2021-07-28 MED ORDER — HYDROCODONE-ACETAMINOPHEN 10-325 MG PO TABS: 1.0000 | ORAL_TABLET | ORAL | 0 refills | Status: DC | PRN

## 2021-07-28 MED ORDER — KETAMINE HCL 10 MG/ML IJ SOLN
INTRAMUSCULAR | Status: DC | PRN
  Administered 2021-07-28: 10 mg via INTRAVENOUS
  Administered 2021-07-28: 5 mg via INTRAVENOUS
  Administered 2021-07-28: 10 mg via INTRAVENOUS

## 2021-07-28 MED ORDER — SODIUM CHLORIDE 0.9 % IR SOLN
Status: DC | PRN
  Administered 2021-07-28 (×2): 1000 mL

## 2021-07-28 MED ORDER — BUPIVACAINE-EPINEPHRINE (PF) 0.5% -1:200000 IJ SOLN
INTRAMUSCULAR | Status: DC | PRN
  Administered 2021-07-28: 30 mL via PERINEURAL

## 2021-07-28 MED ORDER — METOCLOPRAMIDE HCL 5 MG/ML IJ SOLN
INTRAMUSCULAR | Status: DC | PRN
  Administered 2021-07-28: 10 mg via INTRAVENOUS

## 2021-07-28 MED ORDER — PHENYLEPHRINE HCL (PRESSORS) 10 MG/ML IV SOLN
INTRAVENOUS | Status: DC | PRN
Start: 2021-07-28 — End: 2021-07-28
  Administered 2021-07-28 (×2): 80 ug via INTRAVENOUS
  Administered 2021-07-28: 40 ug via INTRAVENOUS
  Administered 2021-07-28 (×5): 80 ug via INTRAVENOUS
  Administered 2021-07-28: 40 ug via INTRAVENOUS
  Administered 2021-07-28 (×4): 80 ug via INTRAVENOUS
  Administered 2021-07-28 (×2): 40 ug via INTRAVENOUS
  Administered 2021-07-28: 80 ug via INTRAVENOUS
  Administered 2021-07-28 (×2): 40 ug via INTRAVENOUS
  Administered 2021-07-28: 80 ug via INTRAVENOUS

## 2021-07-28 MED ORDER — ONDANSETRON HCL 4 MG/2ML IJ SOLN
4.0000 mg | Freq: Once | INTRAMUSCULAR | Status: AC | PRN
  Administered 2021-07-28: 4 mg via INTRAVENOUS
  Filled 2021-07-28: qty 2

## 2021-07-28 MED ORDER — METOCLOPRAMIDE HCL 5 MG/ML IJ SOLN
INTRAMUSCULAR | Status: AC
  Filled 2021-07-28: qty 2

## 2021-07-28 MED ORDER — DEXAMETHASONE SODIUM PHOSPHATE 10 MG/ML IJ SOLN
INTRAMUSCULAR | Status: DC | PRN
  Administered 2021-07-28: 5 mg via INTRAVENOUS

## 2021-07-28 MED ORDER — LIDOCAINE HCL (PF) 2 % IJ SOLN
INTRAMUSCULAR | Status: AC
  Filled 2021-07-28: qty 5

## 2021-07-28 MED ORDER — PROPOFOL 10 MG/ML IV BOLUS
INTRAVENOUS | Status: AC
  Filled 2021-07-28: qty 20

## 2021-07-28 MED ORDER — ONDANSETRON HCL 4 MG/2ML IJ SOLN
INTRAMUSCULAR | Status: DC | PRN
  Administered 2021-07-28: 4 mg via INTRAVENOUS

## 2021-07-28 MED ORDER — GLYCOPYRROLATE PF 0.2 MG/ML IJ SOSY
PREFILLED_SYRINGE | INTRAMUSCULAR | Status: AC
  Filled 2021-07-28: qty 1

## 2021-07-28 MED ORDER — HYDROMORPHONE HCL 1 MG/ML IJ SOLN
0.2500 mg | INTRAMUSCULAR | Status: DC | PRN
  Administered 2021-07-28 (×2): 0.25 mg via INTRAVENOUS
  Filled 2021-07-28: qty 0.5

## 2021-07-28 MED ORDER — DEXMEDETOMIDINE (PRECEDEX) IN NS 20 MCG/5ML (4 MCG/ML) IV SYRINGE
PREFILLED_SYRINGE | INTRAVENOUS | Status: AC
  Filled 2021-07-28: qty 15

## 2021-07-28 MED ORDER — SUGAMMADEX SODIUM 200 MG/2ML IV SOLN
INTRAVENOUS | Status: DC | PRN
Start: 2021-07-28 — End: 2021-07-28
  Administered 2021-07-28: 180 mg via INTRAVENOUS

## 2021-07-28 MED ORDER — KETAMINE HCL 50 MG/5ML IJ SOSY
PREFILLED_SYRINGE | INTRAMUSCULAR | Status: AC
  Filled 2021-07-28: qty 5

## 2021-07-28 MED ORDER — CHLORHEXIDINE GLUCONATE 0.12 % MT SOLN
15.0000 mL | Freq: Once | OROMUCOSAL | Status: AC
  Administered 2021-07-28: 15 mL via OROMUCOSAL
  Filled 2021-07-28: qty 15

## 2021-07-28 MED ORDER — MIDAZOLAM HCL 5 MG/5ML IJ SOLN
INTRAMUSCULAR | Status: DC | PRN
  Administered 2021-07-28: 1 mg via INTRAVENOUS

## 2021-07-28 MED ORDER — LIDOCAINE 2% (20 MG/ML) 5 ML SYRINGE
INTRAMUSCULAR | Status: DC | PRN
  Administered 2021-07-28: 60 mg via INTRAVENOUS

## 2021-07-28 MED ORDER — PHENYLEPHRINE HCL-NACL 20-0.9 MG/250ML-% IV SOLN
INTRAVENOUS | Status: DC | PRN
Start: 2021-07-28 — End: 2021-07-28
  Administered 2021-07-28: 30 ug/min via INTRAVENOUS

## 2021-07-28 MED ORDER — GLYCOPYRROLATE PF 0.2 MG/ML IJ SOSY
PREFILLED_SYRINGE | INTRAMUSCULAR | Status: DC | PRN
  Administered 2021-07-28: .2 mg via INTRAVENOUS

## 2021-07-28 MED ORDER — PHENYLEPHRINE 40 MCG/ML (10ML) SYRINGE FOR IV PUSH (FOR BLOOD PRESSURE SUPPORT)
PREFILLED_SYRINGE | INTRAVENOUS | Status: AC
  Filled 2021-07-28: qty 10

## 2021-07-28 MED ORDER — PHENYLEPHRINE HCL-NACL 20-0.9 MG/250ML-% IV SOLN
INTRAVENOUS | Status: AC
  Filled 2021-07-28: qty 250

## 2021-07-28 MED ORDER — ONDANSETRON HCL 4 MG/2ML IJ SOLN
INTRAMUSCULAR | Status: AC
  Filled 2021-07-28: qty 2

## 2021-07-28 MED ORDER — ROCURONIUM BROMIDE 10 MG/ML (PF) SYRINGE
PREFILLED_SYRINGE | INTRAVENOUS | Status: AC
  Filled 2021-07-28: qty 10

## 2021-07-28 MED ORDER — FENTANYL CITRATE (PF) 100 MCG/2ML IJ SOLN
INTRAMUSCULAR | Status: AC
  Filled 2021-07-28: qty 2

## 2021-07-28 MED ORDER — ROCURONIUM BROMIDE 10 MG/ML (PF) SYRINGE
PREFILLED_SYRINGE | INTRAVENOUS | Status: DC | PRN
  Administered 2021-07-28: 10 mg via INTRAVENOUS
  Administered 2021-07-28: 50 mg via INTRAVENOUS

## 2021-07-28 MED ORDER — DIPHENHYDRAMINE HCL 25 MG PO CAPS: 25.0000 mg | ORAL_CAPSULE | ORAL | 0 refills | Status: DC | PRN

## 2021-07-28 MED ORDER — PROPOFOL 10 MG/ML IV BOLUS
INTRAVENOUS | Status: DC | PRN
  Administered 2021-07-28: 150 mg via INTRAVENOUS

## 2021-07-28 MED ORDER — BUPIVACAINE-EPINEPHRINE (PF) 0.5% -1:200000 IJ SOLN
INTRAMUSCULAR | Status: AC
  Filled 2021-07-28: qty 30

## 2021-07-28 MED ORDER — FENTANYL CITRATE (PF) 250 MCG/5ML IJ SOLN
INTRAMUSCULAR | Status: DC | PRN
  Administered 2021-07-28 (×4): 25 ug via INTRAVENOUS

## 2021-07-28 MED ORDER — MEPERIDINE HCL 50 MG/ML IJ SOLN: 6.2500 mg | INTRAMUSCULAR | Status: DC | PRN

## 2021-07-28 MED ORDER — ORAL CARE MOUTH RINSE: 15.0000 mL | Freq: Once | OROMUCOSAL | Status: AC

## 2021-07-28 MED ORDER — MIDAZOLAM HCL 2 MG/2ML IJ SOLN
INTRAMUSCULAR | Status: AC
  Filled 2021-07-28: qty 2

## 2021-07-28 MED ORDER — LACTATED RINGERS IV SOLN
INTRAVENOUS | Status: DC
  Administered 2021-07-28: 1000 mL via INTRAVENOUS

## 2021-07-28 MED ORDER — SUCCINYLCHOLINE CHLORIDE 200 MG/10ML IV SOSY
PREFILLED_SYRINGE | INTRAVENOUS | Status: AC
  Filled 2021-07-28: qty 10

## 2021-07-28 SURGICAL SUPPLY — 74 items
APL PRP STRL LF DISP 70% ISPRP (MISCELLANEOUS) ×1
BANDAGE ESMARK 4X12 BL STRL LF (DISPOSABLE) ×1 IMPLANT
BIT DRILL 110X30X2XCALB (BIT) IMPLANT
BIT DRILL L45 QC 2.7X125 (BIT) IMPLANT
BIT DRILL QC 2.0X100 (BIT) ×2
BIT DRILL QC 2.5X135 (BIT) ×1 IMPLANT
BIT DRILL QC 2.7X125 (BIT) ×2
BIT DRL 110X30X2XCALB (BIT) ×1
BLADE SURG SZ10 CARB STEEL (BLADE) ×4 IMPLANT
BNDG ADH 5X2 AIR PERM ELC (GAUZE/BANDAGES/DRESSINGS) ×1
BNDG CMPR 12X4 ELC STRL LF (DISPOSABLE) ×1
BNDG CMPR STD VLCR NS LF 5.8X4 (GAUZE/BANDAGES/DRESSINGS) ×3
BNDG COHESIVE 2X5 WHT NS (GAUZE/BANDAGES/DRESSINGS) ×1 IMPLANT
BNDG ELASTIC 4X5.8 VLCR NS LF (GAUZE/BANDAGES/DRESSINGS) ×5 IMPLANT
BNDG ESMARK 4X12 BLUE STRL LF (DISPOSABLE) ×2
BNDG GAUZE ELAST 4 BULKY (GAUZE/BANDAGES/DRESSINGS) ×1 IMPLANT
CHLORAPREP W/TINT 26 (MISCELLANEOUS) ×2 IMPLANT
CLOTH BEACON ORANGE TIMEOUT ST (SAFETY) ×2 IMPLANT
COVER LIGHT HANDLE STERIS (MISCELLANEOUS) ×4 IMPLANT
DRAPE HALF SHEET 40X57 (DRAPES) ×1 IMPLANT
DRAPE ORTHO 2.5IN SPLIT 77X108 (DRAPES) IMPLANT
DRAPE ORTHO SPLIT 77X108 STRL (DRAPES) ×4
DRAPE U-SHAPE 47X51 STRL (DRAPES) ×1 IMPLANT
DRSG MEPILEX BORDER 4X12 (GAUZE/BANDAGES/DRESSINGS) ×2 IMPLANT
ELECT REM PT RETURN 9FT ADLT (ELECTROSURGICAL) ×2
ELECTRODE REM PT RTRN 9FT ADLT (ELECTROSURGICAL) ×1 IMPLANT
GAUZE KERLIX 2X3 DERM STRL LF (GAUZE/BANDAGES/DRESSINGS) ×1 IMPLANT
GAUZE SPONGE 4X4 12PLY STRL (GAUZE/BANDAGES/DRESSINGS) ×1 IMPLANT
GAUZE SPONGE 4X4 12PLY STRL LF (GAUZE/BANDAGES/DRESSINGS) ×1 IMPLANT
GAUZE XEROFORM 5X9 LF (GAUZE/BANDAGES/DRESSINGS) ×1 IMPLANT
GLOVE SS N UNI LF 8.5 STRL (GLOVE) ×2 IMPLANT
GLOVE SURG POLYISO LF SZ8 (GLOVE) ×3 IMPLANT
GLOVE SURG UNDER POLY LF SZ7 (GLOVE) ×6 IMPLANT
GOWN STRL REUS W/TWL LRG LVL3 (GOWN DISPOSABLE) ×4 IMPLANT
GOWN STRL REUS W/TWL XL LVL3 (GOWN DISPOSABLE) ×2 IMPLANT
INST SET MINOR BONE (KITS) ×2 IMPLANT
K-WIRE DBL END TROCAR 6X.062 (WIRE) ×4
KIT TURNOVER KIT A (KITS) ×2 IMPLANT
KWIRE DBL END TROCAR 6X.062 (WIRE) ×2 IMPLANT
MANIFOLD NEPTUNE II (INSTRUMENTS) ×2 IMPLANT
NDL HYPO 18GX1.5 BLUNT FILL (NEEDLE) IMPLANT
NDL HYPO 21X1.5 SAFETY (NEEDLE) ×1 IMPLANT
NEEDLE HYPO 18GX1.5 BLUNT FILL (NEEDLE) ×2 IMPLANT
NEEDLE HYPO 21X1.5 SAFETY (NEEDLE) ×2 IMPLANT
NS IRRIG 1000ML POUR BTL (IV SOLUTION) ×3 IMPLANT
PACK BASIC LIMB (CUSTOM PROCEDURE TRAY) ×2 IMPLANT
PAD ABD 5X9 TENDERSORB (GAUZE/BANDAGES/DRESSINGS) ×2 IMPLANT
PAD ARMBOARD 7.5X6 YLW CONV (MISCELLANEOUS) ×2 IMPLANT
PROS LCP PLATE 12 163M (Plate) ×2 IMPLANT
PROSTHESIS LCP PLATE 12 163M (Plate) IMPLANT
SCREW CORTEX 2.7X26MM (Screw) ×1 IMPLANT
SCREW CORTEX 2.7X30 (Screw) ×1 IMPLANT
SCREW CORTEX 3.5 20MM (Screw) ×1 IMPLANT
SCREW CORTEX 3.5 22MM (Screw) ×4 IMPLANT
SCREW CORTEX 3.5 24MM (Screw) ×1 IMPLANT
SCREW CORTEX 3.5 26MM (Screw) ×1 IMPLANT
SCREW CORTEX 3.5 28MM (Screw) ×3 IMPLANT
SCREW LOCK CORT ST 3.5X20 (Screw) IMPLANT
SCREW LOCK CORT ST 3.5X22 (Screw) IMPLANT
SCREW LOCK CORT ST 3.5X24 (Screw) IMPLANT
SCREW LOCK CORT ST 3.5X26 (Screw) IMPLANT
SCREW LOCK CORT ST 3.5X28 (Screw) IMPLANT
SET BASIN LINEN APH (SET/KITS/TRAYS/PACK) ×2 IMPLANT
SPLINT FIBERGLASS 3X35 (CAST SUPPLIES) ×1 IMPLANT
SPONGE T-LAP 18X18 ~~LOC~~+RFID (SPONGE) ×4 IMPLANT
STAPLER VISISTAT 35W (STAPLE) ×2 IMPLANT
SUT MON AB 0 CT1 (SUTURE) ×1 IMPLANT
SUT MON AB 2-0 SH 27 (SUTURE) ×2
SUT MON AB 2-0 SH27 (SUTURE) IMPLANT
SUT PROLENE 3 0 PS 1 (SUTURE) IMPLANT
SUT SILK 3 0 (SUTURE) ×2
SUT SILK 3-0 18XBRD TIE 12 (SUTURE) IMPLANT
SYR 30ML LL (SYRINGE) ×3 IMPLANT
SYR BULB IRRIG 60ML STRL (SYRINGE) ×2 IMPLANT

## 2021-07-28 NOTE — Transfer of Care (Signed)
Immediate Anesthesia Transfer of Care Note  Patient: Kelli Summers  Procedure(s) Performed: OPEN REDUCTION INTERNAL FIXATION (ORIF) HUMERUS FRACTURE (Left: Shoulder)  Patient Location: PACU  Anesthesia Type:General  Level of Consciousness: sedated, patient cooperative and responds to stimulation  Airway & Oxygen Therapy: Patient Spontanous Breathing and Patient connected to face mask oxygen  Post-op Assessment: Report given to RN, Post -op Vital signs reviewed and stable, Patient moving all extremities X 4 and Patient able to stick tongue midline  Post vital signs: Reviewed  Last Vitals:  Vitals Value Taken Time  BP 109/62 07/28/21 1005  Temp 98.3   Pulse 82 07/28/21 1006  Resp 17 07/28/21 1009  SpO2 94 % 07/28/21 1006  Vitals shown include unvalidated device data.  Last Pain:  Vitals:   07/28/21 0655  TempSrc: Oral  PainSc: 0-No pain      Patients Stated Pain Goal: 8 (07/28/21 1829)  Complications: No notable events documented.

## 2021-07-28 NOTE — Anesthesia Preprocedure Evaluation (Addendum)
Anesthesia Evaluation  Patient identified by MRN, date of birth, ID band Patient awake    Reviewed: Allergy & Precautions, NPO status , Patient's Chart, lab work & pertinent test results, reviewed documented beta blocker date and time   History of Anesthesia Complications (+) history of anesthetic complications (didnt wake up for 3 days after wedge resection in 2014)  Airway Mallampati: II  TM Distance: >3 FB Neck ROM: Full    Dental  (+) Dental Advisory Given, Chipped, Poor Dentition,    Pulmonary asthma , COPD,  COPD inhaler, former smoker,    Pulmonary exam normal breath sounds clear to auscultation       Cardiovascular Exercise Tolerance: Good hypertension, Pt. on medications and Pt. on home beta blockers + dysrhythmias  Rhythm:Regular Rate:Tachycardia  23-Jul-2021 11:19:37 [facility] ROUTINE RECORD 03-25-1956 (63 yr) Female Caucasian Vent. rate 96 BPM PR interval 158 ms QRS duration 74 ms QT/QTcB 392/495 ms P-R-T axes 75 61 70 Sinus rhythm with Premature supraventricular complexes and with occasional and consecutive Premature ventricular complexes Low voltage QRS Septal infarct , age undetermined Abnormal ECG Confirmed by Eleonore Chiquito [phone]) on 07/23/2021 8:25:52 PM   Neuro/Psych PSYCHIATRIC DISORDERS Depression  Neuromuscular disease    GI/Hepatic negative GI ROS, Neg liver ROS,   Endo/Other  diabetes, Poorly Controlled, Type 2, Insulin Dependent, Oral Hypoglycemic Agents  Renal/GU Renal InsufficiencyRenal disease     Musculoskeletal  (+) Arthritis , Osteoarthritis,    Abdominal   Peds  Hematology  (+) anemia ,   Anesthesia Other Findings Lumbar back pain with radiculopathy - left  Reproductive/Obstetrics                           Anesthesia Physical Anesthesia Plan  ASA: 3  Anesthesia Plan: General   Post-op Pain Management: Dilaudid IV    Induction: Intravenous  PONV Risk Score and Plan: 4 or greater and Ondansetron and Metaclopromide  Airway Management Planned: Oral ETT  Additional Equipment:   Intra-op Plan:   Post-operative Plan: Extubation in OR  Informed Consent: I have reviewed the patients History and Physical, chart, labs and discussed the procedure including the risks, benefits and alternatives for the proposed anesthesia with the patient or authorized representative who has indicated his/her understanding and acceptance.     Dental advisory given  Plan Discussed with: CRNA and Surgeon  Anesthesia Plan Comments:        Anesthesia Quick Evaluation

## 2021-07-28 NOTE — Progress Notes (Signed)
Multiple loose teeth were noticed before intubation. Patient denied having multiple loose teeth during pre op evaluation.

## 2021-07-28 NOTE — Op Note (Signed)
07/28/2021  9:58 AM  PATIENT:  Kelli Summers  66 y.o. female  PRE-OPERATIVE DIAGNOSIS: non union  fracture left humerus  POST-OPERATIVE DIAGNOSIS:  non union fracture left humerus  PROCEDURE:  Procedure(s): OPEN REDUCTION INTERNAL FIXATION (ORIF) HUMERUS FRACTURE (Left)  SURGEON:  Surgeon(s) and Role:    Vickki Hearing, MD - Primary  IMPLANTS: SYNTHES 3.5 LC-DCP 12 HOLE PLATE WITH 8 SCREWS AND 2, 2.7 LAG SCREWS   Surgical findings: Fibrous nonunion left humerus  Approach anterolateral  Details of procedure The patient was seen in preop the surgical site was confirmed and marked chart review was completed I reviewed the implants with the surgical team and we proceeded to the operating room where she had general anesthesia.  She was in the supine position with a radiolucent armboard.  After sterile prep and drape timeout was completed  The first incision was made in the deltopectoral interval extended laterally along the lateral edge of the biceps and then brought across towards the center of the elbow just in front of the mobile wad.  Subcutaneous tissue was divided.  The lateral edge of the biceps tendon was divided bluntly with finger dissection away from the brachialis and the musculotendinous nerve was identified and protected the brachialis muscle was split lateral one third medial two thirds down to bone where we encountered the fibrous nonunion which was taken down.  The bone edges were freshened up with rondure.  The excision was then extended proximally and the deltoid attachment was released as well as the pectoralis  With traction and rotation the fracture was reduced and confirmed by x-ray with the C arm  2 lag screws were placed using 2.7 lag screws with Synthes plating system.  We used a 12 hole 3.5 DC-LCP plate with all regular cortical screws.  The plate was contoured to fit the femur on the lateral side.  The screws were placed first close to the lag screws at the  fracture site and then progressively placed going from the fracture to the proximal end and from the fracture to the distal end in sequential manner  The last screw hole was left blank on the end of the plate  Images confirmed reduction the fixation seem to be very firm with good bone quality  The wounds were irrigated and closed with 0 Monocryl 2-0 Monocryl and staples we injected 30 cc of Marcaine with epinephrine into the soft tissues  A sugar-tong type coaptation splint was applied after the sterile dressings and then images were obtained in the operating room which showed restoration of anatomy  Postop plan the patient will be in a splint for 2 weeks and then will take the staples out Will do an x-ray at 2 weeks and then we can place her back in a fracture brace and start elbow motion and shoulder pendulums    PHYSICIAN ASSISTANT:   ASSISTANTS: CYNTHIA WRENN   ANESTHESIA:   general  EBL:  150 mL   BLOOD ADMINISTERED:none  DRAINS: none   LOCAL MEDICATIONS USED:  MARCAINE     SPECIMEN:  No Specimen  DISPOSITION OF SPECIMEN:  N/A  COUNTS:  YES  TOURNIQUET:  * No tourniquets in log *  DICTATION: .Dragon Dictation  PLAN OF CARE: Discharge to home after PACU  PATIENT DISPOSITION:  PACU - hemodynamically stable.   Delay start of Pharmacological VTE agent (>24hrs) due to surgical blood loss or risk of bleeding: not applicable

## 2021-07-28 NOTE — Anesthesia Procedure Notes (Signed)
Procedure Name: Intubation Date/Time: 07/28/2021 7:42 AM Performed by: Maude Leriche, CRNA Pre-anesthesia Checklist: Patient identified, Emergency Drugs available, Suction available and Patient being monitored Patient Re-evaluated:Patient Re-evaluated prior to induction Oxygen Delivery Method: Circle system utilized Preoxygenation: Pre-oxygenation with 100% oxygen Induction Type: IV induction Ventilation: Mask ventilation without difficulty Laryngoscope Size: Miller and 2 Grade View: Grade II Tube type: Oral Tube size: 7.0 mm Number of attempts: 1 Airway Equipment and Method: Stylet and Bite block Placement Confirmation: ETT inserted through vocal cords under direct vision, positive ETCO2 and breath sounds checked- equal and bilateral Secured at: 22 cm Tube secured with: Tape Dental Injury: Teeth and Oropharynx as per pre-operative assessment

## 2021-07-28 NOTE — Brief Op Note (Signed)
07/28/2021  9:58 AM  PATIENT:  Kelli Summers  66 y.o. female  PRE-OPERATIVE DIAGNOSIS: non union  fracture left humerus  POST-OPERATIVE DIAGNOSIS:  non union fracture left humerus  PROCEDURE:  Procedure(s): OPEN REDUCTION INTERNAL FIXATION (ORIF) HUMERUS FRACTURE (Left)  SURGEON:  Surgeon(s) and Role:    Carole Civil, MD - Primary  IMPLANTS: SYNTHES 3.5 LC-DCP 12 HOLE PLATE WITH 8 SCREWS AND 2, 2.7 LAG SCREWS   PHYSICIAN ASSISTANT:   ASSISTANTS: CYNTHIA WRENN   ANESTHESIA:   general  EBL:  150 mL   BLOOD ADMINISTERED:none  DRAINS: none   LOCAL MEDICATIONS USED:  MARCAINE     SPECIMEN:  No Specimen  DISPOSITION OF SPECIMEN:  N/A  COUNTS:  YES  TOURNIQUET:  * No tourniquets in log *  DICTATION: .Dragon Dictation  PLAN OF CARE: Discharge to home after PACU  PATIENT DISPOSITION:  PACU - hemodynamically stable.   Delay start of Pharmacological VTE agent (>24hrs) due to surgical blood loss or risk of bleeding: not applicable

## 2021-07-28 NOTE — Anesthesia Postprocedure Evaluation (Signed)
Anesthesia Post Note  Patient: Kelli Summers  Procedure(s) Performed: OPEN REDUCTION INTERNAL FIXATION (ORIF) HUMERUS FRACTURE (Left: Arm Upper)  Patient location during evaluation: PACU Anesthesia Type: General Level of consciousness: awake and alert and oriented Pain management: pain level controlled Vital Signs Assessment: post-procedure vital signs reviewed and stable Respiratory status: spontaneous breathing, nonlabored ventilation and respiratory function stable Cardiovascular status: blood pressure returned to baseline and stable Postop Assessment: no apparent nausea or vomiting Anesthetic complications: no   No notable events documented.   Last Vitals:  Vitals:   07/28/21 1115 07/28/21 1130  BP: (!) 149/71 (!) 114/99  Pulse: 85 86  Resp: 18 16  Temp:  36.6 C  SpO2: 98% 97%    Last Pain:  Vitals:   07/28/21 1130  TempSrc: Oral  PainSc: 3                  Jasaiah Karwowski C Dallie Patton

## 2021-07-28 NOTE — Interval H&P Note (Signed)
History and Physical Interval Note:  07/28/2021 7:26 AM  Kelli Summers  has presented today for surgery, with the diagnosis of fracture left humerus.  The various methods of treatment have been discussed with the patient and family. After consideration of risks, benefits and other options for treatment, the patient has consented to  Procedure(s): OPEN REDUCTION INTERNAL FIXATION (ORIF) HUMERUS FRACTURE (Left) as a surgical intervention.  The patient's history has been reviewed, patient examined, no change in status, stable for surgery.  I have reviewed the patient's chart and labs.  Questions were answered to the patient's satisfaction.     Fuller Canada

## 2021-07-29 ENCOUNTER — Encounter (HOSPITAL_COMMUNITY): Payer: Self-pay | Admitting: Orthopedic Surgery

## 2021-08-05 ENCOUNTER — Other Ambulatory Visit: Payer: Self-pay

## 2021-08-05 ENCOUNTER — Ambulatory Visit (INDEPENDENT_AMBULATORY_CARE_PROVIDER_SITE_OTHER): Payer: Medicare HMO | Admitting: Orthopedic Surgery

## 2021-08-05 DIAGNOSIS — S42342K Displaced spiral fracture of shaft of humerus, left arm, subsequent encounter for fracture with nonunion: Secondary | ICD-10-CM

## 2021-08-05 NOTE — Progress Notes (Signed)
Chief Complaint  Patient presents with   Routine Post Op    DOS 07/28/21 LT humerus    S/p left humerus orif non union   She is comfortable and pain is controlled   Wrist ext is normal   Rad n function is normal   Return next week for   Xrays in splint f/b staples out and frx cuff application , px will bring old cuff  Encounter Diagnosis  Name Primary?   Closed displaced spiral fracture of shaft of left humerus with nonunion, subsequent encounter 02/21/21  ORIF 07/28/21  Yes

## 2021-08-06 ENCOUNTER — Encounter: Payer: Medicare HMO | Admitting: Orthopedic Surgery

## 2021-08-11 ENCOUNTER — Encounter: Payer: Self-pay | Admitting: Orthopedic Surgery

## 2021-08-11 ENCOUNTER — Other Ambulatory Visit: Payer: Self-pay

## 2021-08-11 ENCOUNTER — Ambulatory Visit: Payer: Medicare HMO

## 2021-08-11 ENCOUNTER — Ambulatory Visit (INDEPENDENT_AMBULATORY_CARE_PROVIDER_SITE_OTHER): Payer: Medicare HMO | Admitting: Orthopedic Surgery

## 2021-08-11 DIAGNOSIS — S42342K Displaced spiral fracture of shaft of humerus, left arm, subsequent encounter for fracture with nonunion: Secondary | ICD-10-CM | POA: Diagnosis not present

## 2021-08-11 NOTE — Progress Notes (Signed)
Postoperative visit #2 status post open reduction internal fixation of nonunion left humerus fracture  This is postop day #14  The patient still has good pain control  Her incision looks pristine  Her radial nerve function is completely normal  Her x-ray shows maintenance of reduction and hardware  Staples are removed  The patient will be placed in a fracture cuff with 5 pound weight limit lifting restriction  Follow-up in 4 weeks for x-ray

## 2021-08-12 ENCOUNTER — Encounter: Payer: Medicare HMO | Admitting: Orthopedic Surgery

## 2021-08-13 DIAGNOSIS — I10 Essential (primary) hypertension: Secondary | ICD-10-CM | POA: Diagnosis not present

## 2021-08-13 DIAGNOSIS — U099 Post covid-19 condition, unspecified: Secondary | ICD-10-CM | POA: Diagnosis not present

## 2021-08-13 DIAGNOSIS — E6609 Other obesity due to excess calories: Secondary | ICD-10-CM | POA: Diagnosis not present

## 2021-08-13 DIAGNOSIS — Z6836 Body mass index (BMI) 36.0-36.9, adult: Secondary | ICD-10-CM | POA: Diagnosis not present

## 2021-08-13 DIAGNOSIS — F329 Major depressive disorder, single episode, unspecified: Secondary | ICD-10-CM | POA: Diagnosis not present

## 2021-08-13 DIAGNOSIS — E782 Mixed hyperlipidemia: Secondary | ICD-10-CM | POA: Diagnosis not present

## 2021-08-13 DIAGNOSIS — E1165 Type 2 diabetes mellitus with hyperglycemia: Secondary | ICD-10-CM | POA: Diagnosis not present

## 2021-08-13 DIAGNOSIS — N390 Urinary tract infection, site not specified: Secondary | ICD-10-CM | POA: Diagnosis not present

## 2021-08-13 DIAGNOSIS — E1122 Type 2 diabetes mellitus with diabetic chronic kidney disease: Secondary | ICD-10-CM | POA: Diagnosis not present

## 2021-09-09 ENCOUNTER — Other Ambulatory Visit: Payer: Self-pay

## 2021-09-09 ENCOUNTER — Ambulatory Visit (INDEPENDENT_AMBULATORY_CARE_PROVIDER_SITE_OTHER): Payer: Medicare HMO | Admitting: Orthopedic Surgery

## 2021-09-09 ENCOUNTER — Ambulatory Visit (INDEPENDENT_AMBULATORY_CARE_PROVIDER_SITE_OTHER): Payer: Medicare HMO

## 2021-09-09 DIAGNOSIS — S42342K Displaced spiral fracture of shaft of humerus, left arm, subsequent encounter for fracture with nonunion: Secondary | ICD-10-CM

## 2021-09-09 NOTE — Progress Notes (Signed)
Chief Complaint  ?Patient presents with  ? Fracture  ?  Left Humerus DOS 07/28/21, a lot better  ? ? ?Postop ORIF left humerus after nonunion ? ?Surgery date was January 31 this is postop day 43 ? ?The patient's radial nerve function is intact her x-ray looks good her range of motion is improved to 90 degrees abduction she can reach up to 120 degrees she has some trouble reaching across her chest other than that she is doing well her wound looks great ? ?She will continue in the brace when out of the house ? ?Plan for x-ray in 6 weeks ?

## 2021-10-22 ENCOUNTER — Encounter: Payer: Self-pay | Admitting: Orthopedic Surgery

## 2021-10-22 ENCOUNTER — Ambulatory Visit (INDEPENDENT_AMBULATORY_CARE_PROVIDER_SITE_OTHER): Payer: Medicare HMO

## 2021-10-22 ENCOUNTER — Ambulatory Visit (INDEPENDENT_AMBULATORY_CARE_PROVIDER_SITE_OTHER): Payer: Medicare HMO | Admitting: Orthopedic Surgery

## 2021-10-22 VITALS — Ht 64.0 in | Wt 200.0 lb

## 2021-10-22 DIAGNOSIS — S42342K Displaced spiral fracture of shaft of humerus, left arm, subsequent encounter for fracture with nonunion: Secondary | ICD-10-CM | POA: Diagnosis not present

## 2021-10-22 NOTE — Progress Notes (Signed)
FOLLOW UP  ? ?Encounter Diagnosis  ?Name Primary?  ? Closed displaced spiral fracture of shaft of left humerus with nonunion, subsequent encounter Yes  ? ? ? ?Chief Complaint  ?Patient presents with  ? Routine Post Op  ?  Lt humeral ORIF DOS 07/28/21  ? ? ? ?Kelli Summers had a nonunion of a left humeral shaft fracture which was eventually treated with open reduction internal fixation on July 28, 2021 her only complaint is lack of sensation in the lateral portion of her forearm which is most likely a lateral antebrachial cutaneous nerve neuropraxia ? ?She has excellent range of motion she has no pain at the fracture site radial nerve motor function is intact her x-ray shows that the fracture is healing there is no change in position of the hardware on the lateral x-ray of the areas that showed no bone contact are still present but the main fracture fragments are united ? ?Recommend x-ray humerus in 3 months ?

## 2021-10-22 NOTE — Patient Instructions (Signed)
Follow up in 3 months with xrays LT arm ?

## 2021-11-12 DIAGNOSIS — E1122 Type 2 diabetes mellitus with diabetic chronic kidney disease: Secondary | ICD-10-CM | POA: Diagnosis not present

## 2021-11-12 DIAGNOSIS — Z6835 Body mass index (BMI) 35.0-35.9, adult: Secondary | ICD-10-CM | POA: Diagnosis not present

## 2021-11-12 DIAGNOSIS — E782 Mixed hyperlipidemia: Secondary | ICD-10-CM | POA: Diagnosis not present

## 2021-11-12 DIAGNOSIS — E6609 Other obesity due to excess calories: Secondary | ICD-10-CM | POA: Diagnosis not present

## 2021-11-12 DIAGNOSIS — N390 Urinary tract infection, site not specified: Secondary | ICD-10-CM | POA: Diagnosis not present

## 2021-11-12 DIAGNOSIS — I10 Essential (primary) hypertension: Secondary | ICD-10-CM | POA: Diagnosis not present

## 2021-11-18 NOTE — Progress Notes (Unsigned)
HISTORY AND PHYSICAL     CC:  follow up. Requesting Provider:  Sharilyn Sites, MD  HPI: This is a 66 y.o. female who is here today for follow up and is pt of Dr. Donzetta Matters and has hx of short distance claudication.  She is being followed for PAD and carotid artery stenosis.  She has hx of DM and HTN.    Pt was last seen on 05/19/2021.  At that time, she had quit smoking 06/28/2020 and she was able to walk without claudication, rest pain or non healing wounds.  Her ABI was around 64%  Her right ICA was 40-59% stenosis and left was 1-39% stenosis.  She was not having any neurologic sx.    The pt returns today for follow up.  She is upset that she was on [facility] for her diabetes with great improvement of her A1c and now her insurance company will not pay for it.    She states that she tried to take the asa every few days but did not tolerate this as it gave her severe heartburn.  She is not on a statin.  She states she took lipitor years ago and it changed her tastes.  She has not tried any other statin.  She continues not to smoke.   Pt denies any amaurosis fugax, speech difficulties, weakness, numbness, paralysis or clumsiness or facial droop.    Pt denies claudication, rest pain, or non healing wounds.    The pt is not on a statin for cholesterol management.    The pt is not on an aspirin.    Other AC:  none The pt is on ARB, BB for hypertension.  The pt does have diabetes. Tobacco hx:  former  Pt does not have family hx of AAA.    Past Medical History:  Diagnosis Date   Arthritis    Asthma    Chronic back pain    Chronic knee pain    Complication of anesthesia    didnt wake up for 3 days after wedge resection in 2014   Depression    Diabetes mellitus    Dysrhythmia    Ejection fraction    History of stress test 01/2010   normal myoview stress test   Hypertension    Sinus tachycardia     Past Surgical History:  Procedure Laterality Date   ABDOMINAL HYSTERECTOMY     BACK  SURGERY     CATARACT EXTRACTION W/PHACO Right 01/12/2019   Procedure: CATARACT EXTRACTION PHACO AND INTRAOCULAR LENS PLACEMENT RIGHT EYE;  Surgeon: Baruch Goldmann, MD;  Location: [facility];  Service: Ophthalmology;  Laterality: Right;  right   CATARACT EXTRACTION W/PHACO Left 01/26/2019   Procedure: CATARACT EXTRACTION PHACO AND INTRAOCULAR LENS PLACEMENT (IOC);  Surgeon: Baruch Goldmann, MD;  Location: [facility];  Service: Ophthalmology;  Laterality: Left;  CDE: 7.07   CHOLECYSTECTOMY     COLONOSCOPY N/A 04/15/2015   Procedure: COLONOSCOPY;  Surgeon: Aviva Signs, MD;  Location: [facility];  Service: Gastroenterology;  Laterality: N/A;   CYST EXCISION Left 04/02/2016   Procedure: EXCISION 1 CM CYST ON LEFT SIDE OF FACE;  Surgeon: Aviva Signs, MD;  Location: [facility];  Service: General;  Laterality: Left;   ORIF HUMERUS FRACTURE Left 07/28/2021   Procedure: OPEN REDUCTION INTERNAL FIXATION (ORIF) HUMERUS FRACTURE;  Surgeon: Carole Civil, MD;  Location: [facility];  Service: Orthopedics;  Laterality: Left;   VIDEO ASSISTED THORACOSCOPY (VATS)/DECORTICATION Right 05/09/2013   Procedure: VIDEO ASSISTED THORACOSCOPY (VATS)/DECORTICATION;  Surgeon: Melrose Nakayama, MD;  Location: [place];  Service: Thoracic;  Laterality: Right;   WEDGE RESECTION Right 05/09/2013   Procedure: WEDGE RESECTION RIGHT LOWER LOBE;  Surgeon: Melrose Nakayama, MD;  Location: [place];  Service: Thoracic;  Laterality: Right;    Allergies  Allergen Reactions   Penicillins Hives, Shortness Of Breath and Itching    Tolerates Ceftin Has patient had a PCN reaction causing immediate rash, facial/tongue/throat swelling, SOB or lightheadedness with hypotension: Yes Has patient had a PCN reaction causing severe rash involving mucus membranes or skin necrosis: No Has patient had a PCN reaction that required hospitalization No Has patient had a PCN reaction occurring within the last 10 years: No If all of the above answers are "NO",  then may proceed with Cephalosporin use.    Ciprofloxacin Hives and Itching   Hydrocodone-Acetaminophen Itching    Pt can take acetaminophen   Percocet [Oxycodone-Acetaminophen] Itching   Biotin Itching and Rash    Current Outpatient Medications  Medication Sig Dispense Refill   albuterol (VENTOLIN HFA) 108 (90 Base) MCG/ACT inhaler 1-2 puffs every 6 (six) hours as needed for wheezing or shortness of breath.     Blood Glucose Monitoring Suppl (TRUE METRIX METER) w/Device KIT      diphenhydrAMINE (BENADRYL ALLERGY) 25 mg capsule Take 1 capsule (25 mg total) by mouth every 4 (four) hours as needed for itching. 30 capsule 0   EPINEPHrine (PRIMATENE MIST IN) Inhale 2 puffs into the lungs 3 (three) times daily as needed (shortness of breath).     fluticasone (FLONASE) 50 MCG/ACT nasal spray Place 1-2 sprays into both nostrils daily as needed (nasal congestion.).     glipiZIDE (GLUCOTROL XL) 10 MG 24 hr tablet Take 20 mg by mouth daily with breakfast.     HYDROcodone-acetaminophen (NORCO) 10-325 MG tablet Take 1 tablet by mouth every 4 (four) hours as needed. 42 tablet 0   LANTUS SOLOSTAR 100 UNIT/ML Solostar Pen Inject 50 Units into the skin at bedtime.     losartan (COZAAR) 100 MG tablet Take 100 mg by mouth in the morning.  0   meloxicam (MOBIC) 15 MG tablet Take 15 mg by mouth in the morning.     metoprolol tartrate (LOPRESSOR) 25 MG tablet Take 25 mg by mouth 2 (two) times daily.     pregabalin (LYRICA) 50 MG capsule Take 50 mg by mouth in the morning and at bedtime.     tirzepatide [facility]) 5 MG/0.5ML Pen Inject 5 mg into the skin every Thursday.     TRUE METRIX BLOOD GLUCOSE TEST test strip SMARTSIG:Via Meter     TRUEplus Lancets 33G MISC      No current facility-administered medications for this visit.    Family History  Problem Relation Age of Onset   Emphysema Maternal Grandfather        smoked   Breast cancer Maternal Aunt     Social History   Socioeconomic History    Marital status: Divorced    Spouse name: Not on file   Number of children: Not on file   Years of education: Not on file   Highest education level: Not on file  Occupational History   Not on file  Tobacco Use   Smoking status: Former    Packs/day: 0.50    Years: 40.00    Pack years: 20.00    Types: Cigarettes    Quit date: 04/28/2012    Years since quitting: 9.5   Smokeless tobacco:  Former  Scientific laboratory technician Use: Never used  Substance and Sexual Activity   Alcohol use: No   Drug use: No   Sexual activity: Not Currently    Birth control/protection: Surgical  Other Topics Concern   Not on file  Social History Narrative   Not on file   Social Determinants of Health   Financial Resource Strain: Not on file  Food Insecurity: Not on file  Transportation Needs: Not on file  Physical Activity: Not on file  Stress: Not on file  Social Connections: Not on file  Intimate Partner Violence: Not on file     REVIEW OF SYSTEMS:   _0  denotes positive finding, _1  denotes negative finding Cardiac  Comments:  Chest pain or chest pressure:    Shortness of breath upon exertion:    Short of breath when lying flat:    Irregular heart rhythm:        Vascular    Pain in calf, thigh, or hip brought on by ambulation:    Pain in feet at night that wakes you up from your sleep:     Blood clot in your veins:    Leg swelling:         Pulmonary    Oxygen at home:    Wheezing:         Neurologic    Sudden weakness in arms or legs:     Sudden numbness in arms or legs:     Sudden onset of difficulty speaking or understanding others    Temporary loss of vision in one eye:     Problems with dizziness:         Gastrointestinal    Blood in stool:     Vomited blood:         Genitourinary    Burning when urinating:     Blood in urine:        Psychiatric    Major depression:         Hematologic    Bleeding problems:    Problems with blood clotting too easily:        Skin     Rashes or ulcers:        Constitutional    Fever or chills:      PHYSICAL EXAMINATION:  Today's Vitals   11/19/21 1425  BP: 139/66  Pulse: 87  Resp: 20  Temp: 98.6 F (37 C)  TempSrc: Temporal  SpO2: 97%  Weight: 207 lb 1.6 oz (93.9 kg)  Height: _2  (1.626 m)  PainSc: 4   PainLoc: Leg   Body mass index is 35.55 kg/m.   General:  WDWN in NAD; vital signs documented above Gait: Not observed HENT: WNL, normocephalic Pulmonary: normal non-labored breathing  Cardiac: regular HR;  without carotid bruits Abdomen: soft, NT, aortic pulse is not palpable Skin: without rashes Vascular Exam/Pulses:  Right Left  Radial 2+ (normal) 2+ (normal)  Popliteal Unable to palpate Unable to palpate  DP monophasic monophasic  PT monophasic monophasic  Peroneal monophasic monophasic   Extremities: without ischemic changes, without Gangrene , without cellulitis; without open wounds Musculoskeletal: no muscle wasting or atrophy  Neurologic: A&O X 3;  speech is fluent/normal; moving all extremities equally  Psychiatric:  The pt has Normal affect.   Non-Invasive Vascular Imaging:   ABI's/TBI's on 11/19/2021: Right:  0.59/0.49 - great toe pressure:  85 Left:  0.67/0.38 - great toe pressure:  65  Previous ABI's/TBI's on 05/19/2021: Right:  0.62/0.28 - great toe pressure:  41 Left:  0.64/0.56 - great toe pressure:  44  Previous Carotid duplex on 05/19/2021: Right: 40-59% ICA stenosis Left:   1-39% ICA stenosis    ASSESSMENT/PLAN:: 66 y.o. female here for follow up for PAD and carotid stenosis   PAD -pt ABI essentially unchanged.  Toe pressures improved from last visit. -pt does not have rest pain, claudication, non healing wounds. -continue graduated walking program -pt will f/u in one year with ABI.  She knows to call sooner if she develops any rest pain or non healing wounds.    Carotid stenosis -pt had duplex in November.   -pt will f/u in 6 months with carotid  duplex  -she does not tolerate asa.  I did discuss with her to have a conversation with her PCP about a different statin as she would benefit from this given her PAD.     Leontine Locket, [facility] [phone]  Clinic MD:   Virl Cagey

## 2021-11-19 ENCOUNTER — Ambulatory Visit (HOSPITAL_COMMUNITY)
Admission: RE | Admit: 2021-11-19 | Discharge: 2021-11-19 | Disposition: A | Discharge status: Home / Self Care | Payer: Medicare HMO | Source: Ambulatory Visit | Attending: Vascular Surgery | Admitting: Vascular Surgery

## 2021-11-19 ENCOUNTER — Ambulatory Visit (INDEPENDENT_AMBULATORY_CARE_PROVIDER_SITE_OTHER): Payer: Medicare HMO | Admitting: Physician Assistant

## 2021-11-19 VITALS — BP 139/66 | HR 87 | Temp 98.6°F | Resp 20 | Ht 64.0 in | Wt 207.1 lb

## 2021-11-19 DIAGNOSIS — I739 Peripheral vascular disease, unspecified: Secondary | ICD-10-CM

## 2021-11-19 DIAGNOSIS — I70219 Atherosclerosis of native arteries of extremities with intermittent claudication, unspecified extremity: Secondary | ICD-10-CM | POA: Insufficient documentation

## 2021-11-24 ENCOUNTER — Other Ambulatory Visit: Payer: Self-pay | Admitting: *Deleted

## 2021-11-24 DIAGNOSIS — I6529 Occlusion and stenosis of unspecified carotid artery: Secondary | ICD-10-CM

## 2021-11-24 DIAGNOSIS — G5601 Carpal tunnel syndrome, right upper limb: Secondary | ICD-10-CM | POA: Diagnosis not present

## 2021-11-25 DIAGNOSIS — E1165 Type 2 diabetes mellitus with hyperglycemia: Secondary | ICD-10-CM | POA: Diagnosis not present

## 2021-11-25 DIAGNOSIS — E782 Mixed hyperlipidemia: Secondary | ICD-10-CM | POA: Diagnosis not present

## 2021-11-25 DIAGNOSIS — I10 Essential (primary) hypertension: Secondary | ICD-10-CM | POA: Diagnosis not present

## 2021-12-23 DIAGNOSIS — I129 Hypertensive chronic kidney disease with stage 1 through stage 4 chronic kidney disease, or unspecified chronic kidney disease: Secondary | ICD-10-CM | POA: Diagnosis not present

## 2021-12-23 DIAGNOSIS — N189 Chronic kidney disease, unspecified: Secondary | ICD-10-CM | POA: Diagnosis not present

## 2021-12-23 DIAGNOSIS — E1151 Type 2 diabetes mellitus with diabetic peripheral angiopathy without gangrene: Secondary | ICD-10-CM | POA: Diagnosis not present

## 2021-12-23 DIAGNOSIS — R809 Proteinuria, unspecified: Secondary | ICD-10-CM | POA: Diagnosis not present

## 2021-12-23 DIAGNOSIS — Z6836 Body mass index (BMI) 36.0-36.9, adult: Secondary | ICD-10-CM | POA: Diagnosis not present

## 2021-12-23 DIAGNOSIS — E1129 Type 2 diabetes mellitus with other diabetic kidney complication: Secondary | ICD-10-CM | POA: Diagnosis not present

## 2021-12-23 DIAGNOSIS — N1832 Chronic kidney disease, stage 3b: Secondary | ICD-10-CM | POA: Diagnosis not present

## 2021-12-23 DIAGNOSIS — E1122 Type 2 diabetes mellitus with diabetic chronic kidney disease: Secondary | ICD-10-CM | POA: Diagnosis not present

## 2021-12-23 DIAGNOSIS — I779 Disorder of arteries and arterioles, unspecified: Secondary | ICD-10-CM | POA: Diagnosis not present

## 2021-12-25 DIAGNOSIS — I10 Essential (primary) hypertension: Secondary | ICD-10-CM | POA: Diagnosis not present

## 2021-12-25 DIAGNOSIS — E782 Mixed hyperlipidemia: Secondary | ICD-10-CM | POA: Diagnosis not present

## 2021-12-25 DIAGNOSIS — E1165 Type 2 diabetes mellitus with hyperglycemia: Secondary | ICD-10-CM | POA: Diagnosis not present

## 2021-12-31 ENCOUNTER — Other Ambulatory Visit: Payer: Self-pay | Admitting: Nephrology

## 2021-12-31 ENCOUNTER — Other Ambulatory Visit (HOSPITAL_COMMUNITY): Payer: Self-pay | Admitting: Nephrology

## 2021-12-31 DIAGNOSIS — E119 Type 2 diabetes mellitus without complications: Secondary | ICD-10-CM | POA: Diagnosis not present

## 2021-12-31 DIAGNOSIS — I129 Hypertensive chronic kidney disease with stage 1 through stage 4 chronic kidney disease, or unspecified chronic kidney disease: Secondary | ICD-10-CM | POA: Diagnosis not present

## 2021-12-31 DIAGNOSIS — E1129 Type 2 diabetes mellitus with other diabetic kidney complication: Secondary | ICD-10-CM

## 2021-12-31 DIAGNOSIS — R809 Proteinuria, unspecified: Secondary | ICD-10-CM | POA: Diagnosis not present

## 2021-12-31 DIAGNOSIS — N189 Chronic kidney disease, unspecified: Secondary | ICD-10-CM | POA: Diagnosis not present

## 2021-12-31 DIAGNOSIS — I779 Disorder of arteries and arterioles, unspecified: Secondary | ICD-10-CM | POA: Diagnosis not present

## 2021-12-31 DIAGNOSIS — E1122 Type 2 diabetes mellitus with diabetic chronic kidney disease: Secondary | ICD-10-CM

## 2021-12-31 DIAGNOSIS — Z6836 Body mass index (BMI) 36.0-36.9, adult: Secondary | ICD-10-CM | POA: Diagnosis not present

## 2022-01-01 DIAGNOSIS — Z6836 Body mass index (BMI) 36.0-36.9, adult: Secondary | ICD-10-CM | POA: Diagnosis not present

## 2022-01-01 DIAGNOSIS — I129 Hypertensive chronic kidney disease with stage 1 through stage 4 chronic kidney disease, or unspecified chronic kidney disease: Secondary | ICD-10-CM | POA: Diagnosis not present

## 2022-01-01 DIAGNOSIS — N189 Chronic kidney disease, unspecified: Secondary | ICD-10-CM | POA: Diagnosis not present

## 2022-01-01 DIAGNOSIS — I779 Disorder of arteries and arterioles, unspecified: Secondary | ICD-10-CM | POA: Diagnosis not present

## 2022-01-01 DIAGNOSIS — E1122 Type 2 diabetes mellitus with diabetic chronic kidney disease: Secondary | ICD-10-CM | POA: Diagnosis not present

## 2022-01-01 DIAGNOSIS — R809 Proteinuria, unspecified: Secondary | ICD-10-CM | POA: Diagnosis not present

## 2022-01-01 DIAGNOSIS — E1129 Type 2 diabetes mellitus with other diabetic kidney complication: Secondary | ICD-10-CM | POA: Diagnosis not present

## 2022-01-07 ENCOUNTER — Ambulatory Visit (HOSPITAL_COMMUNITY)
Admission: RE | Admit: 2022-01-07 | Discharge: 2022-01-07 | Disposition: A | Discharge status: Home / Self Care | Payer: Medicare HMO | Source: Ambulatory Visit | Attending: Nephrology | Admitting: Nephrology

## 2022-01-07 DIAGNOSIS — N189 Chronic kidney disease, unspecified: Secondary | ICD-10-CM | POA: Diagnosis not present

## 2022-01-07 DIAGNOSIS — I129 Hypertensive chronic kidney disease with stage 1 through stage 4 chronic kidney disease, or unspecified chronic kidney disease: Secondary | ICD-10-CM | POA: Insufficient documentation

## 2022-01-07 DIAGNOSIS — R809 Proteinuria, unspecified: Secondary | ICD-10-CM | POA: Insufficient documentation

## 2022-01-07 DIAGNOSIS — E1122 Type 2 diabetes mellitus with diabetic chronic kidney disease: Secondary | ICD-10-CM | POA: Insufficient documentation

## 2022-01-07 DIAGNOSIS — E1129 Type 2 diabetes mellitus with other diabetic kidney complication: Secondary | ICD-10-CM | POA: Insufficient documentation

## 2022-01-21 ENCOUNTER — Encounter: Payer: Self-pay | Admitting: Orthopedic Surgery

## 2022-01-21 ENCOUNTER — Ambulatory Visit (INDEPENDENT_AMBULATORY_CARE_PROVIDER_SITE_OTHER): Payer: Medicare HMO

## 2022-01-21 ENCOUNTER — Ambulatory Visit (INDEPENDENT_AMBULATORY_CARE_PROVIDER_SITE_OTHER): Payer: Medicare HMO | Admitting: Orthopedic Surgery

## 2022-01-21 DIAGNOSIS — S42342K Displaced spiral fracture of shaft of humerus, left arm, subsequent encounter for fracture with nonunion: Secondary | ICD-10-CM | POA: Diagnosis not present

## 2022-01-21 NOTE — Progress Notes (Signed)
Chief Complaint  Patient presents with   Post-op Follow-up    07/28/21 left humerus ORIF    No complaints 6 months post ORIF left humerus after failed brace treatment  No nerve injury no nerve deficits she had a preop shoulder flexion deficit from her prior injury  She regained all of her motion  Her wound looks good  Her x-ray shows healing across 3 cortices and a half  I consider this healed based on her clinical exam  I released her follow-up as needed

## 2022-01-25 DIAGNOSIS — E782 Mixed hyperlipidemia: Secondary | ICD-10-CM | POA: Diagnosis not present

## 2022-01-25 DIAGNOSIS — I10 Essential (primary) hypertension: Secondary | ICD-10-CM | POA: Diagnosis not present

## 2022-01-25 DIAGNOSIS — E1165 Type 2 diabetes mellitus with hyperglycemia: Secondary | ICD-10-CM | POA: Diagnosis not present

## 2022-01-27 DIAGNOSIS — E559 Vitamin D deficiency, unspecified: Secondary | ICD-10-CM | POA: Diagnosis not present

## 2022-01-27 DIAGNOSIS — E1122 Type 2 diabetes mellitus with diabetic chronic kidney disease: Secondary | ICD-10-CM | POA: Diagnosis not present

## 2022-01-27 DIAGNOSIS — R93429 Abnormal radiologic findings on diagnostic imaging of unspecified kidney: Secondary | ICD-10-CM | POA: Diagnosis not present

## 2022-01-27 DIAGNOSIS — R809 Proteinuria, unspecified: Secondary | ICD-10-CM | POA: Diagnosis not present

## 2022-01-27 DIAGNOSIS — E538 Deficiency of other specified B group vitamins: Secondary | ICD-10-CM | POA: Diagnosis not present

## 2022-01-27 DIAGNOSIS — E1129 Type 2 diabetes mellitus with other diabetic kidney complication: Secondary | ICD-10-CM | POA: Diagnosis not present

## 2022-01-27 DIAGNOSIS — N189 Chronic kidney disease, unspecified: Secondary | ICD-10-CM | POA: Diagnosis not present

## 2022-01-27 DIAGNOSIS — I129 Hypertensive chronic kidney disease with stage 1 through stage 4 chronic kidney disease, or unspecified chronic kidney disease: Secondary | ICD-10-CM | POA: Diagnosis not present

## 2022-02-02 DIAGNOSIS — E103413 Type 1 diabetes mellitus with severe nonproliferative diabetic retinopathy with macular edema, bilateral: Secondary | ICD-10-CM | POA: Diagnosis not present

## 2022-02-02 DIAGNOSIS — E083413 Diabetes mellitus due to underlying condition with severe nonproliferative diabetic retinopathy with macular edema, bilateral: Secondary | ICD-10-CM | POA: Diagnosis not present

## 2022-02-11 DIAGNOSIS — Z6836 Body mass index (BMI) 36.0-36.9, adult: Secondary | ICD-10-CM | POA: Diagnosis not present

## 2022-02-11 DIAGNOSIS — M80022P Age-related osteoporosis with current pathological fracture, left humerus, subsequent encounter for fracture with malunion: Secondary | ICD-10-CM | POA: Diagnosis not present

## 2022-02-11 DIAGNOSIS — I10 Essential (primary) hypertension: Secondary | ICD-10-CM | POA: Diagnosis not present

## 2022-02-11 DIAGNOSIS — Z0001 Encounter for general adult medical examination with abnormal findings: Secondary | ICD-10-CM | POA: Diagnosis not present

## 2022-02-11 DIAGNOSIS — E6609 Other obesity due to excess calories: Secondary | ICD-10-CM | POA: Diagnosis not present

## 2022-02-11 DIAGNOSIS — E1165 Type 2 diabetes mellitus with hyperglycemia: Secondary | ICD-10-CM | POA: Diagnosis not present

## 2022-02-11 DIAGNOSIS — Z1331 Encounter for screening for depression: Secondary | ICD-10-CM | POA: Diagnosis not present

## 2022-02-11 DIAGNOSIS — E782 Mixed hyperlipidemia: Secondary | ICD-10-CM | POA: Diagnosis not present

## 2022-02-11 DIAGNOSIS — F329 Major depressive disorder, single episode, unspecified: Secondary | ICD-10-CM | POA: Diagnosis not present

## 2022-02-19 DIAGNOSIS — E113313 Type 2 diabetes mellitus with moderate nonproliferative diabetic retinopathy with macular edema, bilateral: Secondary | ICD-10-CM | POA: Diagnosis not present

## 2022-02-19 DIAGNOSIS — H35033 Hypertensive retinopathy, bilateral: Secondary | ICD-10-CM | POA: Diagnosis not present

## 2022-02-19 DIAGNOSIS — H35372 Puckering of macula, left eye: Secondary | ICD-10-CM | POA: Diagnosis not present

## 2022-02-19 DIAGNOSIS — H43813 Vitreous degeneration, bilateral: Secondary | ICD-10-CM | POA: Diagnosis not present

## 2022-03-10 ENCOUNTER — Telehealth: Payer: Self-pay | Admitting: *Deleted

## 2022-03-10 ENCOUNTER — Encounter: Payer: Self-pay | Admitting: *Deleted

## 2022-03-10 NOTE — Patient Outreach (Signed)
  Care Coordination   Initial Visit Note   03/10/2022 Name: Kelli Summers MRN: [id number] DOB: 11-Jan-1956  Kelli Summers is a 66 y.o. year old female who sees Kelli Found, MD for primary care. I spoke with  Kelli Summers by phone today.  What matters to the patients health and wellness today?  Blood sugar management and medication cost    Goals Addressed             This Visit's Progress    Care Coordination Summers       Care Coordination Interventions: Provided education to patient about basic DM disease process Reviewed medications with patient and discussed importance of medication adherence Discussed plans with patient for ongoing care management follow up and provided patient with direct contact information for care management team Review of patient status, including review of consultants reports, relevant laboratory and other test results, and medications completed Assessed social determinant of health barriers Reviewed and discussed medications and cost as a barrier to management. Has talked with pharmacist in the recent past and prescription assistance was not available for Kelli Summers. She was switched to Trulicity, which is still expensive, and doesn't manage her blood sugar as well.  Provided verbal and written education on Medicare Extra Help for Prescription Drugs Provided verbal and written education on [facility] program and contact information for Kelli Summers [phone] in [place].  Scheduled a follow-up call with Kelli Durie, RN Care Coordinator [phone]) for 04/13/22 at 1:00. Assessed mobility and ability to perform ADLs Assessed family/Social support Provided patient/caregiver with verbal information on [facility] Summers Care Coordination Summers [phone])        SDOH assessments and interventions completed:  Yes  SDOH Interventions Today    Flowsheet Row Most Recent Value  SDOH Interventions   Housing Interventions Intervention Not Indicated   Transportation Interventions Intervention Not Indicated  Utilities Interventions Intervention Not Indicated  Financial Strain Interventions Other (Comment)  [provided information on Medicare Extra Help and prescription assistance for diabetic medications]        Care Coordination Interventions Activated:  Yes  Care Coordination Interventions:  Yes, provided   Follow up plan: Follow up call scheduled for 04/13/22 at 1:00 with Kelli Durie, RN Care Coordinator [phone])   Encounter Outcome:  Pt. Visit Completed   Kelli Summers, BSN, RN-BC RN Care Coordinator [facility]  [facility] Direct Dial: [phone] Main #: [phone]

## 2022-03-11 ENCOUNTER — Telehealth: Payer: Self-pay | Admitting: *Deleted

## 2022-03-11 NOTE — Patient Outreach (Signed)
  Care Coordination   Multidisciplinary Case Review Note    03/11/2022 Name: Kelli Summers MRN: [id number] DOB: [birth year]  Kelli Summers is a 66 y.o. year old female who sees Kelli Sites, MD for primary care.  The  multidisciplinary care team met today to review patient care needs and barriers.     Goals Addressed             This Visit's Progress    Care Coordination Services       Care Coordination Interventions: Complex Care Management discussed with Kelli Summers POD 2 Follow-up scheduled with Kelli David, RN Care Coordinator [phone]) for 04/13/22 Discussed prior interventions provided Discussed barriers to care and medication cost concerns Discussed family support        SDOH assessments and interventions completed:  No   Care Coordination Interventions Activated:  Yes   Care Coordination Interventions:  Yes, provided   Follow up plan: Follow up call scheduled for 04/13/22 with Kelli David, RN Care Coordinator [phone])    Multidisciplinary Team Attendees:   Chong Sicilian, RN Care Manager, Kelli David, RN Care Coordinator, Jacqlyn Larsen, RN Care Coordinator, Nat Christen, LCSW  Scribe for Multidisciplinary Case Review:  Chong Sicilian  Chong Sicilian, BSN, RN-BC RN Care Coordinator [place]: [phone] Main #: [phone]

## 2022-03-19 DIAGNOSIS — D518 Other vitamin B12 deficiency anemias: Secondary | ICD-10-CM | POA: Diagnosis not present

## 2022-04-09 DIAGNOSIS — E113313 Type 2 diabetes mellitus with moderate nonproliferative diabetic retinopathy with macular edema, bilateral: Secondary | ICD-10-CM | POA: Diagnosis not present

## 2022-04-09 DIAGNOSIS — H43813 Vitreous degeneration, bilateral: Secondary | ICD-10-CM | POA: Diagnosis not present

## 2022-04-09 DIAGNOSIS — H35033 Hypertensive retinopathy, bilateral: Secondary | ICD-10-CM | POA: Diagnosis not present

## 2022-04-09 DIAGNOSIS — H35372 Puckering of macula, left eye: Secondary | ICD-10-CM | POA: Diagnosis not present

## 2022-04-13 ENCOUNTER — Ambulatory Visit: Payer: Self-pay | Admitting: *Deleted

## 2022-04-13 NOTE — Patient Instructions (Signed)
Visit Information  Thank you for taking time to visit with me today. Please don't hesitate to contact me if I can be of assistance to you before our next scheduled telephone appointment.  Following are the goals we discussed today:  Continue daily recording of blood glucose Discuss medication management with PCP  Our next appointment is by telephone on 11/17  Please call the care guide team at [phone] if you need to cancel or reschedule your appointment.   Please call the Suicide and Crisis Lifeline: 988 call the [place] National Suicide Prevention Lifeline: 916 560 6677 or TTY: 256-411-1337 TTY (828)207-0184) to talk to a trained counselor call 1-800-273-TALK (toll free, 24 hour hotline) call the [facility]: [phone] call 911 if you are experiencing a Mental Health or [place] or need someone to talk to.  Patient verbalizes understanding of instructions and care plan provided today and agrees to view in [place]. Active MyChart status and patient understanding of how to access instructions and care plan via MyChart confirmed with patient.     The patient has been provided with contact information for the care management team and has been advised to call with any health related questions or concerns.   Valente David, RN, MSN, [place] Care Management Care Management Coordinator [phone]

## 2022-04-13 NOTE — Patient Outreach (Signed)
  Care Coordination   04/13/2022 Name: LAURIANNE FLORESCA MRN: [id number] DOB: [birth year]   Care Coordination Outreach Attempts:  An unsuccessful telephone outreach was attempted for a scheduled appointment today.  Follow Up Plan:  Additional outreach attempts will be made to offer the patient care coordination information and services.   Encounter Outcome:  No Answer  Care Coordination Interventions Activated:  No   Care Coordination Interventions:  No, not indicated    Valente David, RN, MSN, [facility] Care Management Care Management Coordinator [phone]

## 2022-04-13 NOTE — Patient Outreach (Signed)
  Care Coordination   Follow Up Visit Note   04/13/2022 Name: VEATRICE ECKSTEIN MRN: [id number] DOB: 1955-09-26  Jillyn Ledger Paradiso is a 66 y.o. year old female who sees Sharilyn Sites, MD for primary care. I spoke with  [name] by phone today.  What matters to the patients health and wellness today?  State she is now seeing retina specialist, will get injections starting Friday. Sister will provide transportation.  Confirmed she spoke with pharmacy team regarding medication assistance.  Denies any urgent concerns, encouraged to contact this care manager with questions.      Goals Addressed             This Visit's Progress    Effective management of DM       Care Coordination Interventions: Provided education to patient about basic DM disease process Reviewed medications with patient and discussed importance of medication adherence Discussed plans with patient for ongoing care management follow up and provided patient with direct contact information for care management team Reviewed scheduled/upcoming provider appointments including: Retina specialist on 10/20, Kidney specialist on 11/1, and PCP on 11/10 Reviewed range of glucose readings (90-180) Discussed talking to PCP regarding change in medications to manage glucose better in effort to decrease A1C (currently 8)          SDOH assessments and interventions completed:  No     Care Coordination Interventions Activated:  Yes  Care Coordination Interventions:  Yes, provided   Follow up plan: Follow up call scheduled for 11/17    Encounter Outcome:  Pt. Visit Completed   Valente David, RN, MSN, [place] Care Management Care Management Coordinator [phone]

## 2022-04-16 DIAGNOSIS — E113313 Type 2 diabetes mellitus with moderate nonproliferative diabetic retinopathy with macular edema, bilateral: Secondary | ICD-10-CM | POA: Diagnosis not present

## 2022-04-27 DIAGNOSIS — E1165 Type 2 diabetes mellitus with hyperglycemia: Secondary | ICD-10-CM | POA: Diagnosis not present

## 2022-04-27 DIAGNOSIS — E782 Mixed hyperlipidemia: Secondary | ICD-10-CM | POA: Diagnosis not present

## 2022-04-27 DIAGNOSIS — I10 Essential (primary) hypertension: Secondary | ICD-10-CM | POA: Diagnosis not present

## 2022-05-03 DIAGNOSIS — I129 Hypertensive chronic kidney disease with stage 1 through stage 4 chronic kidney disease, or unspecified chronic kidney disease: Secondary | ICD-10-CM | POA: Diagnosis not present

## 2022-05-03 DIAGNOSIS — E538 Deficiency of other specified B group vitamins: Secondary | ICD-10-CM | POA: Diagnosis not present

## 2022-05-03 DIAGNOSIS — E1122 Type 2 diabetes mellitus with diabetic chronic kidney disease: Secondary | ICD-10-CM | POA: Diagnosis not present

## 2022-05-03 DIAGNOSIS — R93429 Abnormal radiologic findings on diagnostic imaging of unspecified kidney: Secondary | ICD-10-CM | POA: Diagnosis not present

## 2022-05-03 DIAGNOSIS — E1129 Type 2 diabetes mellitus with other diabetic kidney complication: Secondary | ICD-10-CM | POA: Diagnosis not present

## 2022-05-03 DIAGNOSIS — N189 Chronic kidney disease, unspecified: Secondary | ICD-10-CM | POA: Diagnosis not present

## 2022-05-03 DIAGNOSIS — R809 Proteinuria, unspecified: Secondary | ICD-10-CM | POA: Diagnosis not present

## 2022-05-03 DIAGNOSIS — E559 Vitamin D deficiency, unspecified: Secondary | ICD-10-CM | POA: Diagnosis not present

## 2022-05-07 ENCOUNTER — Other Ambulatory Visit: Payer: Self-pay | Admitting: Family Medicine

## 2022-05-07 DIAGNOSIS — Z1231 Encounter for screening mammogram for malignant neoplasm of breast: Secondary | ICD-10-CM

## 2022-05-14 ENCOUNTER — Ambulatory Visit
Admission: RE | Admit: 2022-05-14 | Discharge: 2022-05-14 | Disposition: A | Discharge status: Home / Self Care | Payer: Medicare HMO | Source: Ambulatory Visit | Attending: Family Medicine | Admitting: Family Medicine

## 2022-05-14 DIAGNOSIS — E6609 Other obesity due to excess calories: Secondary | ICD-10-CM | POA: Diagnosis not present

## 2022-05-14 DIAGNOSIS — Z1231 Encounter for screening mammogram for malignant neoplasm of breast: Secondary | ICD-10-CM | POA: Diagnosis not present

## 2022-05-14 DIAGNOSIS — E113313 Type 2 diabetes mellitus with moderate nonproliferative diabetic retinopathy with macular edema, bilateral: Secondary | ICD-10-CM | POA: Diagnosis not present

## 2022-05-14 DIAGNOSIS — H43812 Vitreous degeneration, left eye: Secondary | ICD-10-CM | POA: Diagnosis not present

## 2022-05-14 DIAGNOSIS — Z6837 Body mass index (BMI) 37.0-37.9, adult: Secondary | ICD-10-CM | POA: Diagnosis not present

## 2022-05-14 DIAGNOSIS — I10 Essential (primary) hypertension: Secondary | ICD-10-CM | POA: Diagnosis not present

## 2022-05-14 DIAGNOSIS — F329 Major depressive disorder, single episode, unspecified: Secondary | ICD-10-CM | POA: Diagnosis not present

## 2022-05-14 DIAGNOSIS — E1122 Type 2 diabetes mellitus with diabetic chronic kidney disease: Secondary | ICD-10-CM | POA: Diagnosis not present

## 2022-05-14 DIAGNOSIS — E782 Mixed hyperlipidemia: Secondary | ICD-10-CM | POA: Diagnosis not present

## 2022-05-18 DIAGNOSIS — Z6836 Body mass index (BMI) 36.0-36.9, adult: Secondary | ICD-10-CM | POA: Diagnosis not present

## 2022-05-18 DIAGNOSIS — J069 Acute upper respiratory infection, unspecified: Secondary | ICD-10-CM | POA: Diagnosis not present

## 2022-05-18 DIAGNOSIS — E6609 Other obesity due to excess calories: Secondary | ICD-10-CM | POA: Diagnosis not present

## 2022-05-18 DIAGNOSIS — F419 Anxiety disorder, unspecified: Secondary | ICD-10-CM | POA: Diagnosis not present

## 2022-06-10 DIAGNOSIS — E559 Vitamin D deficiency, unspecified: Secondary | ICD-10-CM | POA: Diagnosis not present

## 2022-06-10 DIAGNOSIS — I129 Hypertensive chronic kidney disease with stage 1 through stage 4 chronic kidney disease, or unspecified chronic kidney disease: Secondary | ICD-10-CM | POA: Diagnosis not present

## 2022-06-10 DIAGNOSIS — N2581 Secondary hyperparathyroidism of renal origin: Secondary | ICD-10-CM | POA: Diagnosis not present

## 2022-06-10 DIAGNOSIS — Z6837 Body mass index (BMI) 37.0-37.9, adult: Secondary | ICD-10-CM | POA: Diagnosis not present

## 2022-06-10 DIAGNOSIS — N189 Chronic kidney disease, unspecified: Secondary | ICD-10-CM | POA: Diagnosis not present

## 2022-06-10 DIAGNOSIS — N1831 Chronic kidney disease, stage 3a: Secondary | ICD-10-CM | POA: Diagnosis not present

## 2022-06-10 DIAGNOSIS — E1129 Type 2 diabetes mellitus with other diabetic kidney complication: Secondary | ICD-10-CM | POA: Diagnosis not present

## 2022-06-10 DIAGNOSIS — E1122 Type 2 diabetes mellitus with diabetic chronic kidney disease: Secondary | ICD-10-CM | POA: Diagnosis not present

## 2022-06-10 DIAGNOSIS — E538 Deficiency of other specified B group vitamins: Secondary | ICD-10-CM | POA: Diagnosis not present

## 2022-06-10 DIAGNOSIS — R809 Proteinuria, unspecified: Secondary | ICD-10-CM | POA: Diagnosis not present

## 2022-06-11 DIAGNOSIS — E113313 Type 2 diabetes mellitus with moderate nonproliferative diabetic retinopathy with macular edema, bilateral: Secondary | ICD-10-CM | POA: Diagnosis not present

## 2022-06-23 ENCOUNTER — Encounter: Payer: Medicare HMO | Admitting: *Deleted

## 2022-06-23 ENCOUNTER — Telehealth: Payer: Self-pay | Admitting: *Deleted

## 2022-06-23 NOTE — Progress Notes (Signed)
  Care Coordination Note  06/23/2022 Name: Kelli Summers MRN: [id number] DOB: 1956-03-12  [name] is a 66 y.o. year old female who is a primary care patient of Assunta Found, MD and is actively engaged with the care management team. I reached out to [employer] by phone today to assist with re-scheduling a follow up visit with the RN Case Manager  Follow up plan: Unsuccessful telephone outreach attempt made. A HIPAA compliant phone message was left for the patient providing contact information and requesting a return call.   [facility]  Care Coordination Care Guide  Direct Dial: [phone]

## 2022-06-29 NOTE — Progress Notes (Signed)
  Care Coordination Note  06/29/2022 Name: Kelli Summers MRN: [id number] DOB: May 18, 1956  Kelli Summers is a 67 y.o. year old female who is a primary care patient of Sharilyn Sites, MD and is actively engaged with the care management team. I reached out to [name] by phone today to assist with re-scheduling a follow up visit with the RN Case Manager  Follow up plan: Telephone appointment with care management team member scheduled for:07/02/22  Kelli Summers  Direct Dial: [phone]

## 2022-07-02 ENCOUNTER — Ambulatory Visit: Payer: Self-pay | Admitting: *Deleted

## 2022-07-02 ENCOUNTER — Encounter: Payer: Self-pay | Admitting: *Deleted

## 2022-07-05 NOTE — Patient Outreach (Signed)
  Care Coordination   Follow Up Visit Note   07/01/2022 Name: Kelli Summers MRN: [id number] DOB: 1955-10-25  Kelli Summers is a 67 y.o. year old female who sees Kelli Sites, MD for primary care. I spoke with  Kelli Summers by phone today.  What matters to the patients health and wellness today?  Managing blood sugar    Goals Addressed             This Visit's Progress    Effective management of DM       Care Coordination Interventions: Reviewed medications with patient and discussed importance of medication adherence Counseled on importance of regular laboratory monitoring as prescribed Discussed plans with patient for ongoing care management follow up and provided patient with direct contact information for care management team Reviewed scheduled/upcoming provider appointments including: Dr Kelli Summers (retina specialist) next week for injections, PCP in Feb 2024, Dr Kelli Summers in April 2024,  Advised patient, providing education and rationale, to check cbg daily and PRN and record, calling PCP for findings outside established parameters Review of patient status, including review of consultants reports, relevant laboratory and other test results, and medications completed Assessed social determinant of health barriers Discussed improvement in home blood sugar readings. She feels better and can tell her sugar is better controlled.  Ranging from 85-168 Does drop significantly the first few days after Trulicity injection Taking lantus and glipizide as well. Would like to talk with PCP about weaning off of glipizide Discussed that she did qualify for Medicare Extra Help, but not 100% coverage. It is helping with the cost of her medications though Discussed visit with nephrologist in December and improvement in kidney function labs. Has stopped Meloxicam and believes that may have contributed some to the improvement. Has also increased water intake and is adding fresh lemon juice            SDOH assessments and interventions completed:  Yes  SDOH Interventions Today    Flowsheet Row Most Recent Value  SDOH Interventions   Food Insecurity Interventions Intervention Not Indicated  Transportation Interventions Intervention Not Indicated  Financial Strain Interventions Intervention Not Indicated        Care Coordination Interventions:  Yes, provided  Follow up plan: Follow up call scheduled for 08/02/22    Encounter Outcome:  Visit complete   Chong Sicilian, BSN, RN-BC RN Care Coordinator [place]: [phone] Main #: [phone]

## 2022-07-09 DIAGNOSIS — E113313 Type 2 diabetes mellitus with moderate nonproliferative diabetic retinopathy with macular edema, bilateral: Secondary | ICD-10-CM | POA: Diagnosis not present

## 2022-07-09 DIAGNOSIS — H35372 Puckering of macula, left eye: Secondary | ICD-10-CM | POA: Diagnosis not present

## 2022-07-09 DIAGNOSIS — H35033 Hypertensive retinopathy, bilateral: Secondary | ICD-10-CM | POA: Diagnosis not present

## 2022-07-09 DIAGNOSIS — H43813 Vitreous degeneration, bilateral: Secondary | ICD-10-CM | POA: Diagnosis not present

## 2022-08-02 ENCOUNTER — Encounter: Payer: Self-pay | Admitting: *Deleted

## 2022-08-02 ENCOUNTER — Ambulatory Visit: Payer: Self-pay | Admitting: *Deleted

## 2022-08-02 NOTE — Patient Outreach (Signed)
  Care Coordination   Follow Up Visit Note   08/02/2022 Name: Kelli Summers MRN: [id number] DOB: 22-Apr-1956  Kelli Summers is a 67 y.o. year old female who sees Sharilyn Sites, MD for primary care. I spoke with  [name] by phone today.  What matters to the patients health and wellness today?  Managing blood sugar    Goals Addressed             This Visit's Progress    Effective management of DM       Care Coordination Interventions: Reviewed medications with patient and discussed importance of medication adherence Counseled on importance of regular laboratory monitoring as prescribed Discussed plans with patient for ongoing care management follow up and provided patient with direct contact information for care management team Reviewed scheduled/upcoming provider appointments including: PCP 08/13/22, Dr Theador Hawthorne in April 2024,  Advised patient, providing education and rationale, to check cbg daily and PRN and record, calling PCP for findings outside established parameters Review of patient status, including review of consultants reports, relevant laboratory and other test results, and medications completed Assessed social determinant of health barriers Discussed improvement in home blood sugar readings. She feels better and can tell her sugar is better controlled.  Ranging from 85-168 Reviewed and discussed meds: Mounjaro for DM. Helping to control cravings and portion Discussed diet and portion sizes. Also still adding fresh lemon juice           SDOH assessments and interventions completed:  Yes  SDOH Interventions Today    Flowsheet Row Most Recent Value  SDOH Interventions   Transportation Interventions Intervention Not Indicated  Financial Strain Interventions Intervention Not Indicated        Care Coordination Interventions:  Yes, provided   Follow up plan: Follow up call scheduled for 08/31/22    Encounter Outcome:  Pt. Visit Completed   Chong Sicilian,  BSN, RN-BC RN Care Coordinator [place]: [phone] Main #: [phone]

## 2022-08-06 ENCOUNTER — Other Ambulatory Visit: Payer: Self-pay | Admitting: *Deleted

## 2022-08-06 DIAGNOSIS — I6529 Occlusion and stenosis of unspecified carotid artery: Secondary | ICD-10-CM

## 2022-08-13 DIAGNOSIS — Z6835 Body mass index (BMI) 35.0-35.9, adult: Secondary | ICD-10-CM | POA: Diagnosis not present

## 2022-08-13 DIAGNOSIS — D518 Other vitamin B12 deficiency anemias: Secondary | ICD-10-CM | POA: Diagnosis not present

## 2022-08-13 DIAGNOSIS — M549 Dorsalgia, unspecified: Secondary | ICD-10-CM | POA: Diagnosis not present

## 2022-08-13 DIAGNOSIS — I10 Essential (primary) hypertension: Secondary | ICD-10-CM | POA: Diagnosis not present

## 2022-08-13 DIAGNOSIS — E782 Mixed hyperlipidemia: Secondary | ICD-10-CM | POA: Diagnosis not present

## 2022-08-13 DIAGNOSIS — E1122 Type 2 diabetes mellitus with diabetic chronic kidney disease: Secondary | ICD-10-CM | POA: Diagnosis not present

## 2022-08-13 DIAGNOSIS — M80022P Age-related osteoporosis with current pathological fracture, left humerus, subsequent encounter for fracture with malunion: Secondary | ICD-10-CM | POA: Diagnosis not present

## 2022-08-13 DIAGNOSIS — E6609 Other obesity due to excess calories: Secondary | ICD-10-CM | POA: Diagnosis not present

## 2022-08-19 DIAGNOSIS — M65322 Trigger finger, left index finger: Secondary | ICD-10-CM | POA: Diagnosis not present

## 2022-08-19 DIAGNOSIS — M65332 Trigger finger, left middle finger: Secondary | ICD-10-CM | POA: Diagnosis not present

## 2022-08-19 DIAGNOSIS — G5601 Carpal tunnel syndrome, right upper limb: Secondary | ICD-10-CM | POA: Diagnosis not present

## 2022-08-25 ENCOUNTER — Ambulatory Visit: Payer: Medicare HMO | Admitting: Physician Assistant

## 2022-08-25 ENCOUNTER — Ambulatory Visit (HOSPITAL_COMMUNITY)
Admission: RE | Admit: 2022-08-25 | Discharge: 2022-08-25 | Disposition: A | Discharge status: Home / Self Care | Payer: Medicare HMO | Source: Ambulatory Visit | Attending: Vascular Surgery | Admitting: Vascular Surgery

## 2022-08-25 VITALS — BP 114/71 | HR 102 | Temp 98.0°F | Resp 20 | Ht 64.0 in | Wt 205.1 lb

## 2022-08-25 DIAGNOSIS — I6529 Occlusion and stenosis of unspecified carotid artery: Secondary | ICD-10-CM

## 2022-08-25 DIAGNOSIS — I739 Peripheral vascular disease, unspecified: Secondary | ICD-10-CM

## 2022-08-25 NOTE — Progress Notes (Signed)
Office Note     CC:  follow up Requesting Provider:  Sharilyn Sites, MD  HPI: Kelli Summers is a 67 y.o. ([birth year]) female who presents for surveillance of carotid artery stenosis.  She denies any diagnosis of TIA or CVA since last office visit.  She also denies any current strokelike symptoms including slurring speech, changes in vision, or one-sided weakness.  She does not take aspirin due to side effect of heartburn.  She does not take statin due to side effect profile.  She is a former smoker.  She is also followed by our office for PAD with mild claudication symptoms of bilateral lower extremities.  She denies any increase in symptoms.  She also does not have any rest pain or tissue loss.   Past Medical History:  Diagnosis Date   Arthritis    Asthma    Chronic back pain    Chronic knee pain    Complication of anesthesia    didnt wake up for 3 days after wedge resection in 2014   Depression    Diabetes mellitus    Dysrhythmia    Ejection fraction    History of stress test 01/2010   normal myoview stress test   Hypertension    Sinus tachycardia     Past Surgical History:  Procedure Laterality Date   ABDOMINAL HYSTERECTOMY     BACK SURGERY     CATARACT EXTRACTION W/PHACO Right 01/12/2019   Procedure: CATARACT EXTRACTION PHACO AND INTRAOCULAR LENS PLACEMENT RIGHT EYE;  Surgeon: Baruch Goldmann, MD;  Location: [facility];  Service: Ophthalmology;  Laterality: Right;  right   CATARACT EXTRACTION W/PHACO Left 01/26/2019   Procedure: CATARACT EXTRACTION PHACO AND INTRAOCULAR LENS PLACEMENT (IOC);  Surgeon: Baruch Goldmann, MD;  Location: [facility];  Service: Ophthalmology;  Laterality: Left;  CDE: 7.07   CHOLECYSTECTOMY     COLONOSCOPY N/A 04/15/2015   Procedure: COLONOSCOPY;  Surgeon: Aviva Signs, MD;  Location: [facility];  Service: Gastroenterology;  Laterality: N/A;   CYST EXCISION Left 04/02/2016   Procedure: EXCISION 1 CM CYST ON LEFT SIDE OF FACE;  Surgeon: Aviva Signs,  MD;  Location: [facility];  Service: General;  Laterality: Left;   ORIF HUMERUS FRACTURE Left 07/28/2021   Procedure: OPEN REDUCTION INTERNAL FIXATION (ORIF) HUMERUS FRACTURE;  Surgeon: Carole Civil, MD;  Location: [facility];  Service: Orthopedics;  Laterality: Left;   REDUCTION MAMMAPLASTY     VIDEO ASSISTED THORACOSCOPY (VATS)/DECORTICATION Right 05/09/2013   Procedure: VIDEO ASSISTED THORACOSCOPY (VATS)/DECORTICATION;  Surgeon: Melrose Nakayama, MD;  Location: [facility];  Service: Thoracic;  Laterality: Right;   WEDGE RESECTION Right 05/09/2013   Procedure: WEDGE RESECTION RIGHT LOWER LOBE;  Surgeon: Melrose Nakayama, MD;  Location: [facility];  Service: Thoracic;  Laterality: Right;    Social History   Socioeconomic History   Marital status: Divorced    Spouse name: Not on file   Number of children: Not on file   Years of education: Not on file   Highest education level: Not on file  Occupational History   Not on file  Tobacco Use   Smoking status: Former    Packs/day: 0.50    Years: 40.00    Total pack years: 20.00    Types: Cigarettes    Quit date: 04/28/2012    Years since quitting: 10.3    Passive exposure: Never   Smokeless tobacco: Former  Scientific laboratory technician Use: Never used  Substance and Sexual Activity  Alcohol use: No   Drug use: No   Sexual activity: Not Currently    Birth control/protection: Surgical  Other Topics Concern   Not on file  Social History Narrative   Not on file   Social Determinants of Health   Financial Resource Strain: Low Risk  (08/02/2022)   Overall Financial Resource Strain (CARDIA)    Difficulty of Paying Living Expenses: Not very hard  Food Insecurity: No Food Insecurity (07/02/2022)   Hunger Vital Sign    Worried About Running Out of Food in the Last Year: Never true    Ran Out of Food in the Last Year: Never true  Transportation Needs: No Transportation Needs (08/02/2022)   PRAPARE - Hydrologist  (Medical): No    Lack of Transportation (Non-Medical): No  Physical Activity: Not on file  Stress: Not on file  Social Connections: Not on file  Intimate Partner Violence: Not on file    Family History  Problem Relation Age of Onset   Breast cancer Maternal Aunt    Breast cancer Maternal Aunt    Breast cancer Maternal Aunt    Breast cancer Maternal Aunt    Breast cancer Maternal Grandmother    Emphysema Maternal Grandfather        smoked    Current Outpatient Medications  Medication Sig Dispense Refill   albuterol (VENTOLIN HFA) 108 (90 Base) MCG/ACT inhaler 1-2 puffs every 6 (six) hours as needed for wheezing or shortness of breath.     Blood Glucose Monitoring Suppl (TRUE METRIX METER) w/Device KIT      Dulaglutide (TRULICITY [place]) Inject into the skin.     EPINEPHrine (PRIMATENE MIST IN) Inhale 2 puffs into the lungs 3 (three) times daily as needed (shortness of breath).     fluticasone (FLONASE) 50 MCG/ACT nasal spray Place 1-2 sprays into both nostrils daily as needed (nasal congestion.).     glipiZIDE (GLUCOTROL XL) 10 MG 24 hr tablet Take 20 mg by mouth daily with breakfast.     LANTUS SOLOSTAR 100 UNIT/ML Solostar Pen Inject 50 Units into the skin at bedtime.     losartan (COZAAR) 100 MG tablet Take 100 mg by mouth in the morning.  0   metoprolol tartrate (LOPRESSOR) 25 MG tablet Take 25 mg by mouth 2 (two) times daily.     pregabalin (LYRICA) 50 MG capsule Take 50 mg by mouth in the morning and at bedtime.     TRUE METRIX BLOOD GLUCOSE TEST test strip SMARTSIG:Via Meter     TRUEplus Lancets 33G MISC      No current facility-administered medications for this visit.    Allergies  Allergen Reactions   Penicillins Hives, Shortness Of Breath and Itching    Tolerates Ceftin Has patient had a PCN reaction causing immediate rash, facial/tongue/throat swelling, SOB or lightheadedness with hypotension: Yes Has patient had a PCN reaction causing severe rash involving mucus  membranes or skin necrosis: No Has patient had a PCN reaction that required hospitalization No Has patient had a PCN reaction occurring within the last 10 years: No If all of the above answers are "NO", then may proceed with Cephalosporin use.    Ciprofloxacin Hives and Itching   Hydrocodone-Acetaminophen Itching    Pt can take acetaminophen   Percocet [Oxycodone-Acetaminophen] Itching   Biotin Itching and Rash     REVIEW OF SYSTEMS:   '[X]'$  denotes positive finding, '[ ]'$  denotes negative finding Cardiac  Comments:  Chest pain or  chest pressure:    Shortness of breath upon exertion:    Short of breath when lying flat:    Irregular heart rhythm:        Vascular    Pain in calf, thigh, or hip brought on by ambulation:    Pain in feet at night that wakes you up from your sleep:     Blood clot in your veins:    Leg swelling:         Pulmonary    Oxygen at home:    Productive cough:     Wheezing:         Neurologic    Sudden weakness in arms or legs:     Sudden numbness in arms or legs:     Sudden onset of difficulty speaking or slurred speech:    Temporary loss of vision in one eye:     Problems with dizziness:         Gastrointestinal    Blood in stool:     Vomited blood:         Genitourinary    Burning when urinating:     Blood in urine:        Psychiatric    Major depression:         Hematologic    Bleeding problems:    Problems with blood clotting too easily:        Skin    Rashes or ulcers:        Constitutional    Fever or chills:      PHYSICAL EXAMINATION:  Vitals:   08/25/22 1501 08/25/22 1507  BP: 115/77 114/71  Pulse: (!) 102   Resp: 20   Temp: 98 F (36.7 C)   TempSrc: Temporal   SpO2: 99%   Weight: 205 lb 1.6 oz (93 kg)   Height: '5\' 4"'$  (1.626 m)     General:  WDWN in NAD; vital signs documented above Gait: Not observed HENT: WNL, normocephalic Pulmonary: normal non-labored breathing , without Rales, rhonchi,  wheezing Cardiac:  regular HR Abdomen: soft, NT, no masses Skin: without rashes Vascular Exam/Pulses:  Right Left  Radial 2+ (normal) 2+ (normal)   Extremities: without ischemic changes, without Gangrene , without cellulitis; without open wounds;  Musculoskeletal: no muscle wasting or atrophy  Neurologic: A&O X 3;  No focal weakness or paresthesias are detected Psychiatric:  The pt has Normal affect.   Non-Invasive Vascular Imaging:   40 to 59% stenosis of bilateral internal carotid arteries    ASSESSMENT/PLAN:: 67 y.o. female here for follow up for carotid artery stenosis  -Subjectively she has not had any neurological events since last office visit.  Carotid duplex demonstrates a stable right ICA stenosis estimated to be between 40 and 59%.  Left ICA stenosis was between 1 and 39% during last office visit.  This has increased to 40 to 59% stenosis however the systolic and diastolic velocities have only minimally increased.  No indication for further workup or intervention at this time.  Encouraged her to increase her activity level especially with weather starting to change.  She will continue to follow with her PCP for chronic medical conditions including insulin-dependent diabetes and hypertension.  We will repeat carotid duplex in 6 months.  At that time we will also recheck her ABIs.   Dagoberto Ligas, PA-C [facility] [phone]  Clinic MD:   Donzetta Matters

## 2022-08-26 ENCOUNTER — Encounter: Payer: Self-pay | Admitting: Radiology

## 2022-08-27 ENCOUNTER — Other Ambulatory Visit: Payer: Self-pay

## 2022-08-27 DIAGNOSIS — E113413 Type 2 diabetes mellitus with severe nonproliferative diabetic retinopathy with macular edema, bilateral: Secondary | ICD-10-CM | POA: Diagnosis not present

## 2022-08-27 DIAGNOSIS — I70219 Atherosclerosis of native arteries of extremities with intermittent claudication, unspecified extremity: Secondary | ICD-10-CM

## 2022-08-27 DIAGNOSIS — I6529 Occlusion and stenosis of unspecified carotid artery: Secondary | ICD-10-CM

## 2022-08-27 DIAGNOSIS — I739 Peripheral vascular disease, unspecified: Secondary | ICD-10-CM

## 2022-08-31 ENCOUNTER — Encounter: Payer: Self-pay | Admitting: *Deleted

## 2022-08-31 ENCOUNTER — Ambulatory Visit: Payer: Self-pay | Admitting: *Deleted

## 2022-08-31 NOTE — Patient Outreach (Signed)
  Care Coordination   Follow Up Visit Note   08/31/2022 Name: Kelli Summers MRN: [id number] DOB: 1956-03-09  Kelli Summers is a 67 y.o. year old female who sees Sharilyn Sites, MD for primary care. I spoke with  Kelli Summers by phone today.  What matters to the patients health and wellness today?  Managing blood sugar, having carpal tunnel surgery, and following up with vascular surgeon    Goals Addressed             This Visit's Progress    Manage Carotid Artery Stenosis & PAD       Care Coordination Goals: Patient will f/u with vascular surgeon in Aug for 6 month carotid duplex and ABIs Patient will reach out to provider with any new or worsening symptoms Patient will reach out to [place] with any resource or care coordination needs     Manage Carpal Tunnel (right)       Care Coordination Goals: Patient will have carpal tunnel surgery on right hand with Dr Apolonio Schneiders on 09/15/22 Patient will follow-up with surgeon as instructed  Patient will reach out to [place] Coordinator with any care coordination or resource needs [phone]      Manage Diabetes   On track    Care Coordination Goals: Patient will continue to take medications as prescribed Patient will keep follow-up appointments with PCP and specialists Patient will continue to monitor and record blood sugar daily and PRN and will call provider with any readings outside of recommended range Patient will repot any negative side effects of medications to provider Patient will reach out to [place] Coordinator with any care coordination or resource needs [phone]         SDOH assessments and interventions completed:  Yes  SDOH Interventions Today    Flowsheet Row Most Recent Value  SDOH Interventions   Food Insecurity Interventions Intervention Not Indicated  Transportation Interventions Intervention Not Indicated  Financial Strain Interventions Intervention Not Indicated        Care  Coordination Interventions:  Yes, provided  Interventions Today    Flowsheet Row Most Recent Value  Chronic Disease   Chronic disease during today's visit Diabetes, Other  [Carotid Artery Stenosis, Carpal Tunnell]  General Interventions   General Interventions Discussed/Reviewed General Interventions Discussed, Labs, Durable Medical Equipment (DME), Doctor Visits  Labs Hgb A1c every 3 months  Doctor Visits Discussed/Reviewed Doctor Visits Discussed, Doctor Visits Reviewed, PCP, Specialist  Durable Medical Equipment (DME) Glucomoter  PCP/Specialist Visits Compliance with follow-up visit  Exercise Interventions   Exercise Discussed/Reviewed Physical Activity  Physical Activity Discussed/Reviewed Physical Activity Discussed  Nutrition Interventions   Nutrition Discussed/Reviewed Nutrition Discussed  Pharmacy Interventions   Pharmacy Dicussed/Reviewed Medications and their functions  [reconcilled meds and updated list]       Follow up plan: Follow up call scheduled for 10/01/22    Encounter Outcome:  Pt. Visit Completed   Chong Sicilian, BSN, RN-BC RN Care Coordinator [place]: [phone] Main #: [phone]

## 2022-09-15 DIAGNOSIS — G5601 Carpal tunnel syndrome, right upper limb: Secondary | ICD-10-CM | POA: Diagnosis not present

## 2022-09-28 DIAGNOSIS — G5601 Carpal tunnel syndrome, right upper limb: Secondary | ICD-10-CM | POA: Diagnosis not present

## 2022-10-01 ENCOUNTER — Ambulatory Visit: Payer: Self-pay | Admitting: *Deleted

## 2022-10-01 NOTE — Patient Outreach (Signed)
  Care Coordination   10/01/2022 Name: Kelli Summers MRN: [id number] DOB: May 23, 1956   Care Coordination Outreach Attempts:  An unsuccessful telephone outreach was attempted for a scheduled appointment today.  Follow Up Plan:  Additional outreach attempts will be made to offer the patient care coordination information and services.   Encounter Outcome:  No Answer. Left HIPAA compliant VM.   Care Coordination Interventions:  No, not indicated    Demetrios Loll, BSN, RN-BC RN Care Coordinator [facility]  [facility] Direct Dial: [phone] Main #: [phone]

## 2022-10-08 ENCOUNTER — Ambulatory Visit: Payer: Self-pay | Admitting: *Deleted

## 2022-10-08 ENCOUNTER — Encounter: Payer: Self-pay | Admitting: *Deleted

## 2022-10-08 NOTE — Patient Outreach (Signed)
  Care Coordination   Follow Up Visit Note   10/08/2022 Name: Kelli Summers MRN: [id number] DOB: [birth year]  Kelli Summers is a 67 y.o. year old female who sees Assunta Found, MD for primary care. I spoke with  Kelli Summers by phone today.  What matters to the patients health and wellness today?  Managing blood sugar and preserving kidney function    Goals Addressed             This Visit's Progress    COMPLETED: Manage Carpal Tunnel (right)       Care Coordination Goals: Patient will have carpal tunnel surgery on right hand with Dr Orlan Leavens on 09/15/22 Patient will follow-up with surgeon as instructed  Patient will reach out to RN Care Coordinator with any care coordination or resource needs [phone]      Manage Chronic Kidney Disease       Care Coordination Goals: Patient will keep all medical appointments Patient will take medications as prescribed  Patient will avoid NSAIDs Patient will reach out to RN Care Manager [phone] with any care coordination or resource needs     Manage Diabetes   On track    Care Coordination Goals: Patient will continue to take medications as prescribed Patient will keep follow-up appointments with PCP and specialists Patient will continue to monitor and record blood sugar daily and PRN and will call provider with any readings outside of recommended range Patient will repot any negative side effects of medications to provider Patient will reach out to RN Care Coordinator with any care coordination or resource needs [phone]         SDOH assessments and interventions completed:  Yes  SDOH Interventions Today    Flowsheet Row Most Recent Value  SDOH Interventions   Transportation Interventions Intervention Not Indicated  Financial Strain Interventions Intervention Not Indicated        Care Coordination Interventions:  Yes, provided  Interventions Today    Flowsheet Row Most Recent Value  Chronic Disease   Chronic  disease during today's visit Diabetes, Chronic Kidney Disease/End Stage Renal Disease (ESRD)  General Interventions   General Interventions Discussed/Reviewed General Interventions Discussed, General Interventions Reviewed, Labs, Doctor Visits, Durable Medical Equipment (DME)  Labs Hgb A1c every 3 months, Kidney Function  Doctor Visits Discussed/Reviewed Doctor Visits Discussed, Doctor Visits Reviewed, PCP, Specialist  Durable Medical Equipment (DME) Glucomoter  PCP/Specialist Visits Compliance with follow-up visit  Exercise Interventions   Exercise Discussed/Reviewed Physical Activity  Physical Activity Discussed/Reviewed Physical Activity Discussed  Education Interventions   Education Provided Provided Education  Provided Verbal Education On When to see the doctor, Mental Health/Coping with Illness, Medication, Blood Sugar Monitoring, Labs  Labs Reviewed Hgb A1c, Kidney Function  Nutrition Interventions   Nutrition Discussed/Reviewed Nutrition Reviewed, Carbohydrate meal planning, Portion sizes, Decreasing sugar intake  Pharmacy Interventions   Pharmacy Dicussed/Reviewed Medications and their functions  Safety Interventions   Safety Discussed/Reviewed Safety Discussed       Follow up plan: Follow up call scheduled for 12/08/22    Encounter Outcome:  Pt. Visit Completed   Demetrios Loll, BSN, RN-BC RN Care Coordinator [facility]  [facility] Direct Dial: [phone] Main #: [phone]

## 2022-10-22 DIAGNOSIS — E113413 Type 2 diabetes mellitus with severe nonproliferative diabetic retinopathy with macular edema, bilateral: Secondary | ICD-10-CM | POA: Diagnosis not present

## 2022-10-27 DIAGNOSIS — E1129 Type 2 diabetes mellitus with other diabetic kidney complication: Secondary | ICD-10-CM | POA: Diagnosis not present

## 2022-10-27 DIAGNOSIS — N189 Chronic kidney disease, unspecified: Secondary | ICD-10-CM | POA: Diagnosis not present

## 2022-10-27 DIAGNOSIS — E1122 Type 2 diabetes mellitus with diabetic chronic kidney disease: Secondary | ICD-10-CM | POA: Diagnosis not present

## 2022-10-27 DIAGNOSIS — Z6837 Body mass index (BMI) 37.0-37.9, adult: Secondary | ICD-10-CM | POA: Diagnosis not present

## 2022-10-27 DIAGNOSIS — R809 Proteinuria, unspecified: Secondary | ICD-10-CM | POA: Diagnosis not present

## 2022-10-27 DIAGNOSIS — E538 Deficiency of other specified B group vitamins: Secondary | ICD-10-CM | POA: Diagnosis not present

## 2022-10-27 DIAGNOSIS — E559 Vitamin D deficiency, unspecified: Secondary | ICD-10-CM | POA: Diagnosis not present

## 2022-10-27 DIAGNOSIS — I129 Hypertensive chronic kidney disease with stage 1 through stage 4 chronic kidney disease, or unspecified chronic kidney disease: Secondary | ICD-10-CM | POA: Diagnosis not present

## 2022-11-04 DIAGNOSIS — I129 Hypertensive chronic kidney disease with stage 1 through stage 4 chronic kidney disease, or unspecified chronic kidney disease: Secondary | ICD-10-CM | POA: Diagnosis not present

## 2022-11-04 DIAGNOSIS — I739 Peripheral vascular disease, unspecified: Secondary | ICD-10-CM | POA: Diagnosis not present

## 2022-11-04 DIAGNOSIS — Z7689 Persons encountering health services in other specified circumstances: Secondary | ICD-10-CM | POA: Diagnosis not present

## 2022-11-04 DIAGNOSIS — N2581 Secondary hyperparathyroidism of renal origin: Secondary | ICD-10-CM | POA: Diagnosis not present

## 2022-11-12 DIAGNOSIS — E782 Mixed hyperlipidemia: Secondary | ICD-10-CM | POA: Diagnosis not present

## 2022-11-12 DIAGNOSIS — E6609 Other obesity due to excess calories: Secondary | ICD-10-CM | POA: Diagnosis not present

## 2022-11-12 DIAGNOSIS — E1122 Type 2 diabetes mellitus with diabetic chronic kidney disease: Secondary | ICD-10-CM | POA: Diagnosis not present

## 2022-11-12 DIAGNOSIS — I10 Essential (primary) hypertension: Secondary | ICD-10-CM | POA: Diagnosis not present

## 2022-11-12 DIAGNOSIS — Z6835 Body mass index (BMI) 35.0-35.9, adult: Secondary | ICD-10-CM | POA: Diagnosis not present

## 2022-12-07 DIAGNOSIS — H35033 Hypertensive retinopathy, bilateral: Secondary | ICD-10-CM | POA: Diagnosis not present

## 2022-12-07 DIAGNOSIS — E113313 Type 2 diabetes mellitus with moderate nonproliferative diabetic retinopathy with macular edema, bilateral: Secondary | ICD-10-CM | POA: Diagnosis not present

## 2022-12-07 DIAGNOSIS — H43813 Vitreous degeneration, bilateral: Secondary | ICD-10-CM | POA: Diagnosis not present

## 2022-12-07 DIAGNOSIS — H35372 Puckering of macula, left eye: Secondary | ICD-10-CM | POA: Diagnosis not present

## 2022-12-08 ENCOUNTER — Ambulatory Visit: Payer: Self-pay | Admitting: *Deleted

## 2022-12-08 ENCOUNTER — Encounter: Payer: Self-pay | Admitting: *Deleted

## 2022-12-08 NOTE — Patient Outreach (Signed)
Care Coordination   Follow Up Visit Note   12/08/2022 Name: Kelli Summers MRN: [id number] DOB: 1955-07-03  Kelli Summers is a 67 y.o. year old female who sees Assunta Found, MD for primary care. I spoke with  [name] by phone today.  What matters to the patients health and wellness today?  Managing blood sugar, getting Dexcom CGM, Managing eye conditions    Goals Addressed             This Visit's Progress    Manage Carotid Artery Stenosis & PAD   On track    Care Coordination Goals: Patient will f/u with vascular surgeon in Aug for 6 month carotid duplex and ABIs Patient will reach out to provider with any new or worsening symptoms Patient will reach out to RN Care Coordination with any resource or care coordination needs     Manage Chronic Kidney Disease   On track    Care Coordination Goals: Patient will keep all medical appointments Patient will take medications as prescribed  Patient will avoid NSAIDs Patient will reach out to RN Care Manager [phone] with any care coordination or resource needs     Manage Diabetes   On track    Care Coordination Goals: Patient will continue to take medications as prescribed Getting samples of Mounjaro from PCP Patient will keep follow-up appointments with PCP and specialists Dr Cordelia Poche, retina specialist for injections Being referred to another eye specialist for weakness in left eye muscle Patient will continue to monitor and record blood sugar daily and PRN and will call provider with any readings outside of recommended range Patient will continue to work with PCP and DME supplier regarding Dexcom 7 Order has been sent in. Waiting on insurance approval.  Patient will repot any negative side effects of medications to provider Patient will reach out to RN Care Coordinator with any care coordination or resource needs [phone]         SDOH assessments and interventions completed:  Yes  SDOH Interventions Today     Flowsheet Row Most Recent Value  SDOH Interventions   Transportation Interventions Intervention Not Indicated  Financial Strain Interventions Intervention Not Indicated        Care Coordination Interventions:  Yes, provided  Interventions Today    Flowsheet Row Most Recent Value  Chronic Disease   Chronic disease during today's visit Diabetes, Chronic Kidney Disease/End Stage Renal Disease (ESRD)  General Interventions   General Interventions Discussed/Reviewed General Interventions Discussed, General Interventions Reviewed, Labs, Durable Medical Equipment (DME), Annual Eye Exam, Doctor Visits  Labs Hgb A1c every 3 months, Kidney Function  Doctor Visits Discussed/Reviewed Doctor Visits Discussed, Specialist, Doctor Visits Reviewed, PCP  Durable Medical Equipment (DME) Glucomoter  [Dexcom 7 ordered. Waiting on insurance approval]  PCP/Specialist Visits Compliance with follow-up visit  Exercise Interventions   Exercise Discussed/Reviewed Exercise Discussed, Exercise Reviewed, Physical Activity  Physical Activity Discussed/Reviewed Physical Activity Discussed, Physical Activity Reviewed  Education Interventions   Education Provided Provided Education  Provided Verbal Education On Labs, Blood Sugar Monitoring, Exercise, Medication, Mental Health/Coping with Illness, When to see the doctor, Nutrition, Eye Care  Labs Reviewed Hgb A1c, Kidney Function, Lipid Profile  [11/12/22 A1C 7.1, LDL 71, HDL 48 ([facility])]  Nutrition Interventions   Nutrition Discussed/Reviewed Nutrition Discussed, Nutrition Reviewed, Carbohydrate meal planning  Pharmacy Interventions   Pharmacy Dicussed/Reviewed Pharmacy Topics Discussed, Pharmacy Topics Reviewed, Medications and their functions, Affording Medications  [Getting samples of Mounjaro from PCP office]  Safety  Interventions   Safety Discussed/Reviewed Safety Discussed, Safety Reviewed       Follow up plan: Follow up call scheduled for  01/26/23    Encounter Outcome:  Pt. Visit Completed   Demetrios Loll, BSN, RN-BC RN Care Coordinator [facility]  [facility] Direct Dial: [phone] Main #: [phone]

## 2023-01-26 ENCOUNTER — Encounter: Payer: Self-pay | Admitting: *Deleted

## 2023-02-02 ENCOUNTER — Ambulatory Visit: Payer: Self-pay | Admitting: *Deleted

## 2023-02-02 ENCOUNTER — Encounter: Payer: Self-pay | Admitting: *Deleted

## 2023-02-02 NOTE — Patient Outreach (Signed)
Care Coordination   Follow Up Visit Note   02/02/2023 Name: [name] MRN: [id number] DOB: Dec 27, 1955  Kelli Summers is a 67 y.o. year old female who sees Kelli Found, MD for primary care. I spoke with  Kelli Summers by phone today.  What matters to the patients health and wellness today?  Managing blood sugar. Patient is using the Dexcom G7 and is very happy with the system. She uses it frequently during the day to monitor blood sugar patterns and to see how diet and exercise affect her blood sugar.   Goals Addressed             This Visit's Progress    Manage Carotid Artery Stenosis & PAD   On track    Care Coordination Goals: Patient will f/u with vascular surgeon on 02/23/23 for carotid duplex and ABIs Patient will reach out to provider with any new or worsening symptoms Patient will reach out to RN Care Coordination with any resource or care coordination needs [phone]     Manage Chronic Kidney Disease   On track    Care Coordination Goals: Patient will keep all medical appointments Patient will take medications as prescribed  Patient will avoid NSAIDs Patient will reach out to RN Care Manager [phone] with any care coordination or resource needs     Manage Diabetes   On track    Care Coordination Goals: Patient will continue to take medications as prescribed Patient will keep follow-up appointments with PCP and specialists Patient will continue to monitor and record blood 3 times a day and PRN with Dexcom 7 and will call provider with any readings less than 70 or with 3 readings in a row over 200 Patient will eat 3 meals a day with 30GM of CHO and up to 2 snacks per day with less than 15GM of CHO Patient will reach out to RN Care Coordinator with any care coordination or resource needs [phone]         SDOH assessments and interventions completed:  Yes  SDOH Interventions Today    Flowsheet Row Most Recent Value  SDOH Interventions    Transportation Interventions Intervention Not Indicated  Financial Strain Interventions Intervention Not Indicated        Care Coordination Interventions:  Yes, provided  Interventions Today    Flowsheet Row Most Recent Value  Chronic Disease   Chronic disease during today's visit Diabetes, Chronic Kidney Disease/End Stage Renal Disease (ESRD), Other  [Carotid artery stenosis and PAD]  General Interventions   General Interventions Discussed/Reviewed General Interventions Discussed, General Interventions Reviewed, Doctor Visits, Labs, Horticulturist, commercial (DME), Annual Eye Exam, Annual Foot Exam  Labs Hgb A1c every 3 months  Doctor Visits Discussed/Reviewed Doctor Visits Discussed, Doctor Visits Reviewed, PCP, Specialist, Annual Wellness Visits  Durable Medical Equipment (DME) Other  [Dexcom G7. Blood sugar 85 this AM. Monitors Dexcom regularly and has been paying attention to blood sugar patterns associated with exercise and diet. Has had a couple readings below 70 but were corrected & several above 200 but knew it was diet related]  PCP/Specialist Visits Compliance with follow-up visit  [follow-up with PCP at the end of August. Vascular surgeon, Carotid dopplers, and ABIs on 02/23/23.]  Exercise Interventions   Exercise Discussed/Reviewed Exercise Discussed, Exercise Reviewed, Physical Activity  Physical Activity Discussed/Reviewed Physical Activity Discussed, Physical Activity Reviewed  [encouraged increased physical activity with a goal of at least 150 minutes of moderate activity per week]  Education Interventions  Education Provided Provided Education  Provided Verbal Education On Nutrition, When to see the doctor, Medication, Labs, Eye Care, Blood Sugar Monitoring, Exercise  Labs Reviewed Hgb A1c  [11/12/2022 A1C 7.1%]  Nutrition Interventions   Nutrition Discussed/Reviewed Nutrition Discussed, Nutrition Reviewed, Fluid intake, Portion sizes, Carbohydrate meal planning,  Decreasing sugar intake, Increasing proteins, Adding fruits and vegetables  [Encouraged 3 meals a day with 30GM of CHO and up to 2 snacks per day with less than 15GM of CHO]  Pharmacy Interventions   Pharmacy Dicussed/Reviewed Pharmacy Topics Discussed, Pharmacy Topics Reviewed, Medications and their functions  [Taking medications regularly as prescribed]  Safety Interventions   Safety Discussed/Reviewed Safety Discussed, Safety Reviewed       Follow up plan: Follow up call scheduled for 04/05/23    Encounter Outcome:  Pt. Visit Completed   Demetrios Loll, BSN, RN-BC RN Care Coordinator [facility]  [facility] Direct Dial: [phone] Main #: [phone]

## 2023-02-08 DIAGNOSIS — E1122 Type 2 diabetes mellitus with diabetic chronic kidney disease: Secondary | ICD-10-CM | POA: Diagnosis not present

## 2023-02-08 DIAGNOSIS — N183 Chronic kidney disease, stage 3 unspecified: Secondary | ICD-10-CM | POA: Diagnosis not present

## 2023-02-08 DIAGNOSIS — R809 Proteinuria, unspecified: Secondary | ICD-10-CM | POA: Diagnosis not present

## 2023-02-08 DIAGNOSIS — H35033 Hypertensive retinopathy, bilateral: Secondary | ICD-10-CM | POA: Diagnosis not present

## 2023-02-08 DIAGNOSIS — D631 Anemia in chronic kidney disease: Secondary | ICD-10-CM | POA: Diagnosis not present

## 2023-02-08 DIAGNOSIS — H43813 Vitreous degeneration, bilateral: Secondary | ICD-10-CM | POA: Diagnosis not present

## 2023-02-08 DIAGNOSIS — N189 Chronic kidney disease, unspecified: Secondary | ICD-10-CM | POA: Diagnosis not present

## 2023-02-08 DIAGNOSIS — H35372 Puckering of macula, left eye: Secondary | ICD-10-CM | POA: Diagnosis not present

## 2023-02-08 DIAGNOSIS — Z961 Presence of intraocular lens: Secondary | ICD-10-CM | POA: Diagnosis not present

## 2023-02-08 DIAGNOSIS — E113313 Type 2 diabetes mellitus with moderate nonproliferative diabetic retinopathy with macular edema, bilateral: Secondary | ICD-10-CM | POA: Diagnosis not present

## 2023-02-11 DIAGNOSIS — E782 Mixed hyperlipidemia: Secondary | ICD-10-CM | POA: Diagnosis not present

## 2023-02-11 DIAGNOSIS — Z6835 Body mass index (BMI) 35.0-35.9, adult: Secondary | ICD-10-CM | POA: Diagnosis not present

## 2023-02-11 DIAGNOSIS — Z1331 Encounter for screening for depression: Secondary | ICD-10-CM | POA: Diagnosis not present

## 2023-02-11 DIAGNOSIS — Z0001 Encounter for general adult medical examination with abnormal findings: Secondary | ICD-10-CM | POA: Diagnosis not present

## 2023-02-11 DIAGNOSIS — E1122 Type 2 diabetes mellitus with diabetic chronic kidney disease: Secondary | ICD-10-CM | POA: Diagnosis not present

## 2023-02-11 DIAGNOSIS — E6609 Other obesity due to excess calories: Secondary | ICD-10-CM | POA: Diagnosis not present

## 2023-02-11 DIAGNOSIS — D518 Other vitamin B12 deficiency anemias: Secondary | ICD-10-CM | POA: Diagnosis not present

## 2023-02-11 DIAGNOSIS — I10 Essential (primary) hypertension: Secondary | ICD-10-CM | POA: Diagnosis not present

## 2023-02-18 DIAGNOSIS — N2581 Secondary hyperparathyroidism of renal origin: Secondary | ICD-10-CM | POA: Diagnosis not present

## 2023-02-18 DIAGNOSIS — I129 Hypertensive chronic kidney disease with stage 1 through stage 4 chronic kidney disease, or unspecified chronic kidney disease: Secondary | ICD-10-CM | POA: Diagnosis not present

## 2023-02-18 DIAGNOSIS — R809 Proteinuria, unspecified: Secondary | ICD-10-CM | POA: Diagnosis not present

## 2023-02-18 DIAGNOSIS — E1129 Type 2 diabetes mellitus with other diabetic kidney complication: Secondary | ICD-10-CM | POA: Diagnosis not present

## 2023-02-23 ENCOUNTER — Ambulatory Visit: Payer: Medicare HMO | Admitting: Physician Assistant

## 2023-02-23 ENCOUNTER — Ambulatory Visit (HOSPITAL_COMMUNITY)
Admission: RE | Admit: 2023-02-23 | Discharge: 2023-02-23 | Disposition: A | Discharge status: Home / Self Care | Payer: Medicare HMO | Source: Ambulatory Visit | Attending: Vascular Surgery | Admitting: Vascular Surgery

## 2023-02-23 ENCOUNTER — Ambulatory Visit (INDEPENDENT_AMBULATORY_CARE_PROVIDER_SITE_OTHER)
Admission: RE | Admit: 2023-02-23 | Discharge: 2023-02-23 | Disposition: A | Discharge status: Home / Self Care | Payer: Medicare HMO | Source: Ambulatory Visit | Attending: Vascular Surgery | Admitting: Vascular Surgery

## 2023-02-23 VITALS — BP 122/73 | HR 93 | Temp 98.0°F | Wt 205.0 lb

## 2023-02-23 DIAGNOSIS — I70219 Atherosclerosis of native arteries of extremities with intermittent claudication, unspecified extremity: Secondary | ICD-10-CM

## 2023-02-23 DIAGNOSIS — J4489 Other specified chronic obstructive pulmonary disease: Secondary | ICD-10-CM | POA: Diagnosis not present

## 2023-02-23 DIAGNOSIS — I739 Peripheral vascular disease, unspecified: Secondary | ICD-10-CM | POA: Diagnosis not present

## 2023-02-23 DIAGNOSIS — I70213 Atherosclerosis of native arteries of extremities with intermittent claudication, bilateral legs: Secondary | ICD-10-CM

## 2023-02-23 DIAGNOSIS — I6529 Occlusion and stenosis of unspecified carotid artery: Secondary | ICD-10-CM | POA: Insufficient documentation

## 2023-02-23 LAB — VAS US ABI WITH/WO TBI
Left ABI: 0.63
Right ABI: 0.68

## 2023-02-23 NOTE — Progress Notes (Signed)
Office Note     CC:  follow up Requesting Provider:  Assunta Found, MD  HPI: Kelli Summers is a 67 y.o. (1956/02/11) female who presents for surveillance of carotid artery stenosis as well as PAD.  She has claudication symptoms in both calves after walking long distance.  This does not impact her day-to-day life.  She is also without rest pain or tissue loss of bilateral lower extremities.  She is also followed for carotid artery stenosis.  She denies any diagnosis of CVA or TIA since last office visit.  She also denies any current strokelike symptoms including slurring speech, changes in vision, or one-sided weakness.  Past medical history also significant for insulin-dependent diabetes mellitus.  She states her glucose has been under much better control since getting the skin sensor with phone app to monitor her glucose levels.  Her current A1c is 6.3.  She denies tobacco use.   Past Medical History:  Diagnosis Date   Arthritis    Asthma    Chronic back pain    Chronic knee pain    Complication of anesthesia    didnt wake up for 3 days after wedge resection in 2014   Depression    Diabetes mellitus    Dysrhythmia    Ejection fraction    History of stress test 01/2010   normal myoview stress test   Hypertension    Sinus tachycardia     Past Surgical History:  Procedure Laterality Date   ABDOMINAL HYSTERECTOMY     BACK SURGERY     CATARACT EXTRACTION W/PHACO Right 01/12/2019   Procedure: CATARACT EXTRACTION PHACO AND INTRAOCULAR LENS PLACEMENT RIGHT EYE;  Surgeon: Fabio Pierce, MD;  Location: [facility];  Service: Ophthalmology;  Laterality: Right;  right   CATARACT EXTRACTION W/PHACO Left 01/26/2019   Procedure: CATARACT EXTRACTION PHACO AND INTRAOCULAR LENS PLACEMENT (IOC);  Surgeon: Fabio Pierce, MD;  Location: [facility];  Service: Ophthalmology;  Laterality: Left;  CDE: 7.07   CHOLECYSTECTOMY     COLONOSCOPY N/A 04/15/2015   Procedure: COLONOSCOPY;  Surgeon: Franky Macho,  MD;  Location: [facility];  Service: Gastroenterology;  Laterality: N/A;   CYST EXCISION Left 04/02/2016   Procedure: EXCISION 1 CM CYST ON LEFT SIDE OF FACE;  Surgeon: Franky Macho, MD;  Location: [facility];  Service: General;  Laterality: Left;   ORIF HUMERUS FRACTURE Left 07/28/2021   Procedure: OPEN REDUCTION INTERNAL FIXATION (ORIF) HUMERUS FRACTURE;  Surgeon: Vickki Hearing, MD;  Location: [facility];  Service: Orthopedics;  Laterality: Left;   REDUCTION MAMMAPLASTY     VIDEO ASSISTED THORACOSCOPY (VATS)/DECORTICATION Right 05/09/2013   Procedure: VIDEO ASSISTED THORACOSCOPY (VATS)/DECORTICATION;  Surgeon: Loreli Slot, MD;  Location: [facility] OR;  Service: Thoracic;  Laterality: Right;   WEDGE RESECTION Right 05/09/2013   Procedure: WEDGE RESECTION RIGHT LOWER LOBE;  Surgeon: Loreli Slot, MD;  Location: [facility] OR;  Service: Thoracic;  Laterality: Right;    Social History   Socioeconomic History   Marital status: Divorced    Spouse name: Not on file   Number of children: Not on file   Years of education: Not on file   Highest education level: Not on file  Occupational History   Not on file  Tobacco Use   Smoking status: Former    Current packs/day: 0.00    Average packs/day: 0.5 packs/day for 40.0 years (20.0 ttl pk-yrs)    Types: Cigarettes    Start date: 04/28/1972    Quit date: 04/28/2012  Years since quitting: 10.8    Passive exposure: Never   Smokeless tobacco: Former  Building services engineer status: Never Used  Substance and Sexual Activity   Alcohol use: No   Drug use: No   Sexual activity: Not Currently    Birth control/protection: Surgical  Other Topics Concern   Not on file  Social History Narrative   Not on file   Social Determinants of Health   Financial Resource Strain: Low Risk  (02/02/2023)   Overall Financial Resource Strain (CARDIA)    Difficulty of Paying Living Expenses: Not very hard  Food Insecurity: No Food Insecurity (08/31/2022)   Hunger  Vital Sign    Worried About Running Out of Food in the Last Year: Never true    Ran Out of Food in the Last Year: Never true  Transportation Needs: No Transportation Needs (02/02/2023)   PRAPARE - Administrator, Civil Service (Medical): No    Lack of Transportation (Non-Medical): No  Physical Activity: Not on file  Stress: Not on file  Social Connections: Unknown (11/06/2021)   Received from [facility]   Social Network    Social Network: Not on file  Intimate Partner Violence: Unknown (09/29/2021)   Received from [facility]   HITS    Physically Hurt: Not on file    Insult or Talk Down To: Not on file    Threaten Physical Harm: Not on file    Scream or Curse: Not on file    Family History  Problem Relation Age of Onset   Breast cancer Maternal Aunt    Breast cancer Maternal Aunt    Breast cancer Maternal Aunt    Breast cancer Maternal Aunt    Breast cancer Maternal Grandmother    Emphysema Maternal Grandfather        smoked    Current Outpatient Medications  Medication Sig Dispense Refill   albuterol (VENTOLIN HFA) 108 (90 Base) MCG/ACT inhaler 1-2 puffs every 6 (six) hours as needed for wheezing or shortness of breath.     Dulaglutide (TRULICITY Curry) Inject into the skin.     EPINEPHrine (PRIMATENE MIST IN) Inhale 2 puffs into the lungs 3 (three) times daily as needed (shortness of breath).     fluticasone (FLONASE) 50 MCG/ACT nasal spray Place 1-2 sprays into both nostrils daily as needed (nasal congestion.).     glipiZIDE (GLUCOTROL XL) 10 MG 24 hr tablet Take 20 mg by mouth daily with breakfast.     LANTUS SOLOSTAR 100 UNIT/ML Solostar Pen Inject 50 Units into the skin at bedtime.     losartan (COZAAR) 100 MG tablet Take 100 mg by mouth in the morning.  0   metoprolol tartrate (LOPRESSOR) 25 MG tablet Take 25 mg by mouth 2 (two) times daily.     tirzepatide (MOUNJARO) 7.5 MG/0.5ML Pen Inject 10 mg into the skin once a week.     No current  facility-administered medications for this visit.    Allergies  Allergen Reactions   Penicillins Hives, Shortness Of Breath and Itching    Tolerates Ceftin Has patient had a PCN reaction causing immediate rash, facial/tongue/throat swelling, SOB or lightheadedness with hypotension: Yes Has patient had a PCN reaction causing severe rash involving mucus membranes or skin necrosis: No Has patient had a PCN reaction that required hospitalization No Has patient had a PCN reaction occurring within the last 10 years: No If all of the above answers are "NO", then may proceed with Cephalosporin  use.    Ciprofloxacin Hives and Itching   Hydrocodone-Acetaminophen Itching    Pt can take acetaminophen   Percocet [Oxycodone-Acetaminophen] Itching   Biotin Itching and Rash     REVIEW OF SYSTEMS:   [X]  denotes positive finding, [ ]  denotes negative finding Cardiac  Comments:  Chest pain or chest pressure:    Shortness of breath upon exertion:    Short of breath when lying flat:    Irregular heart rhythm:        Vascular    Pain in calf, thigh, or hip brought on by ambulation:    Pain in feet at night that wakes you up from your sleep:     Blood clot in your veins:    Leg swelling:         Pulmonary    Oxygen at home:    Productive cough:     Wheezing:         Neurologic    Sudden weakness in arms or legs:     Sudden numbness in arms or legs:     Sudden onset of difficulty speaking or slurred speech:    Temporary loss of vision in one eye:     Problems with dizziness:         Gastrointestinal    Blood in stool:     Vomited blood:         Genitourinary    Burning when urinating:     Blood in urine:        Psychiatric    Major depression:         Hematologic    Bleeding problems:    Problems with blood clotting too easily:        Skin    Rashes or ulcers:        Constitutional    Fever or chills:      PHYSICAL EXAMINATION:  Vitals:   02/23/23 1404 02/23/23 1406   BP: (!) 141/84 122/73  Pulse: 93   Temp: 98 F (36.7 C)   TempSrc: Temporal   SpO2: 97%   Weight: 205 lb (93 kg)     General:  WDWN in NAD; vital signs documented above Gait: Not observed HENT: WNL, normocephalic Pulmonary: normal non-labored breathing , without Rales, rhonchi,  wheezing Cardiac: regular HR Abdomen: soft, NT, no masses Skin: without rashes Vascular Exam/Pulses: absent pedal pulses Extremities: without ischemic changes, without Gangrene , without cellulitis; without open wounds;  Musculoskeletal: no muscle wasting or atrophy  Neurologic: A&O X 3; CN grossly intact Psychiatric:  The pt has Normal affect.   Non-Invasive Vascular Imaging:   Bilateral internal carotid arteries 1 to 39% stenosis  ABI/TBIToday's ABIToday's TBIPrevious ABIPrevious TBI  +-------+-----------+-----------+------------+------------+  Right 0.68       0.37       0.59        0.49          +-------+-----------+-----------+------------+------------+  Left  0.63       0.33       0.67        0.38          +-------+-----------+-----------+------------+------------+     ASSESSMENT/PLAN:: 67 y.o. female here for follow up for surveillance of carotid artery stenosis as well as PAD  -Patient states her claudication symptoms of both calves are stable.  She is able to walk around her house without having to rest.  Her symptoms do not impact her day-to-day life.  She is also without rest pain  or tissue loss of bilateral lower extremities.  No indication for further workup for angiography at this time.  Repeat ABIs in 1 year.  Encouraged patient to walk is much as possible to encourage collateral flow.  She is also followed for carotid artery stenosis.  6 months ago duplex demonstrated 40 to 59% stenosis of bilateral internal carotid arteries.  Duplex today demonstrates 1 to 39% stenosis of bilateral internal carotid arteries.  She will continue her daily aspirin.  We will repeat  carotid duplex in 1 year with ABIs.   Emilie Rutter, PA-C [facility] [phone]  Clinic MD:   Randie Heinz

## 2023-03-11 ENCOUNTER — Other Ambulatory Visit: Payer: Self-pay

## 2023-03-11 DIAGNOSIS — I739 Peripheral vascular disease, unspecified: Secondary | ICD-10-CM

## 2023-03-11 DIAGNOSIS — I6529 Occlusion and stenosis of unspecified carotid artery: Secondary | ICD-10-CM

## 2023-03-23 DIAGNOSIS — L400 Psoriasis vulgaris: Secondary | ICD-10-CM | POA: Diagnosis not present

## 2023-04-05 ENCOUNTER — Ambulatory Visit: Payer: Self-pay | Admitting: *Deleted

## 2023-04-05 ENCOUNTER — Encounter: Payer: Self-pay | Admitting: *Deleted

## 2023-04-05 DIAGNOSIS — E113313 Type 2 diabetes mellitus with moderate nonproliferative diabetic retinopathy with macular edema, bilateral: Secondary | ICD-10-CM | POA: Diagnosis not present

## 2023-04-05 DIAGNOSIS — Z961 Presence of intraocular lens: Secondary | ICD-10-CM | POA: Diagnosis not present

## 2023-04-05 DIAGNOSIS — H43813 Vitreous degeneration, bilateral: Secondary | ICD-10-CM | POA: Diagnosis not present

## 2023-04-05 DIAGNOSIS — H35372 Puckering of macula, left eye: Secondary | ICD-10-CM | POA: Diagnosis not present

## 2023-04-05 DIAGNOSIS — H35033 Hypertensive retinopathy, bilateral: Secondary | ICD-10-CM | POA: Diagnosis not present

## 2023-04-06 NOTE — Patient Outreach (Signed)
Care Coordination   Follow Up Visit Note   04/05/2023 Name: Kelli Summers MRN: [id number] DOB: 10/14/1955  Kelli Summers is a 67 y.o. year old female who sees Assunta Found, MD for primary care. I spoke with  [name] by phone today.  What matters to the patients health and wellness today?  Managing blood sugar and improving eye health    Goals Addressed             This Visit's Progress    COMPLETED: Manage Carotid Artery Stenosis & PAD   On track    Care Coordination Goals: Patient will f/u with vascular surgeon in 01/2024 for carotid duplex and ABIs Patient will reach out to provider with any new or worsening symptoms Patient will reach out to RN Care Coordination with any resource or care coordination needs [phone]  Condition is stable and patient is following up with vascular surgeon as recommended.      Manage Chronic Kidney Disease   On track    Care Coordination Goals: Patient will keep all medical appointments Patient will take medications as prescribed  Patient will avoid NSAIDs Patient will reach out to RN Care Manager [phone] with any care coordination or resource needs     Manage Diabetes   On track    Care Coordination Goals: Patient will continue to take medications as prescribed Patient will keep follow-up appointments with PCP and specialists Ophthalmologist-Dr Govand today at 2:30 for retinopathy and bilateral vitreous degeneration Patient will continue to monitor and record blood sugar 3 times a day and PRN with Dexcom 7 and will call provider with any readings less than 70 or with 3 readings in a row over 200 Patient will eat 3 meals a day with 30GM of CHO and up to 2 snacks per day with less than 15GM of CHO Patient will reach out to RN Care Coordinator with any care coordination or resource needs [phone]         SDOH assessments and interventions completed:  Yes  SDOH Interventions Today    Flowsheet Row Most Recent Value   SDOH Interventions   Transportation Interventions Intervention Not Indicated  Health Literacy Interventions Intervention Not Indicated        Care Coordination Interventions:  Yes, provided  Interventions Today    Flowsheet Row Most Recent Value  Chronic Disease   Chronic disease during today's visit Diabetes, Chronic Kidney Disease/End Stage Renal Disease (ESRD)  General Interventions   General Interventions Discussed/Reviewed General Interventions Discussed, General Interventions Reviewed, Labs, Durable Medical Equipment (DME), Doctor Visits  Labs Hgb A1c every 3 months, Kidney Function  Doctor Visits Discussed/Reviewed Doctor Visits Discussed, Doctor Visits Reviewed, PCP, Annual Wellness Visits, Specialist  Durable Medical Equipment (DME) Glucomoter  [Dexcom 7 Continuous Glucose Meter. Closely monitoring glucose trends and how certain foods and activity affect it. Glucose was 118 this morning.]  PCP/Specialist Visits Compliance with follow-up visit  [Dr Theodoro Kalata, Opthalmologist, today for vitreous degeneration and retinopathy. Receiving eye injections. Follow-up with PCP in 3 months and nephrology as recommended.]  Exercise Interventions   Exercise Discussed/Reviewed Exercise Discussed, Exercise Reviewed, Physical Activity  Physical Activity Discussed/Reviewed Physical Activity Discussed, Physical Activity Reviewed  [encouraged to increase physical activity as tolerated with an ultimate goal of 150 minutes per week]  Education Interventions   Education Provided Provided Education  Provided Verbal Education On Nutrition, When to see the doctor, Mental Health/Coping with Illness, Blood Sugar Monitoring, Exercise, Medication, Labs, Eye Care  [Follow-up with  opthalmologist sooner than next scheduled visit if she develops any new or worsening symptoms]  Labs Reviewed Hgb A1c, Kidney Function  [02/09/23 Creatinine 1.3, BUN 20, eGFR 45  02/18/23 A1C 6.3%]  Mental Health Interventions   Mental  Health Discussed/Reviewed Mental Health Discussed, Mental Health Reviewed, Other, Anxiety  [coping with changes in eye health. Concerned about potential complications and reports anxiety associated with this. Will talk with opthalmologist about these concerns at appointment today. Therapeutic listening utilized.]  Nutrition Interventions   Nutrition Discussed/Reviewed Nutrition Discussed, Nutrition Reviewed, Carbohydrate meal planning, Portion sizes, Adding fruits and vegetables, Increasing proteins, Fluid intake, Decreasing sugar intake  [patient is closely monitoring her glucose level with Dexcom and is aware of how certain foods, like rice, increase her blood sugar. She is adjusting portion sizes and swapping out foods that increase glucose for ones that don't.]  Pharmacy Interventions   Pharmacy Dicussed/Reviewed Pharmacy Topics Discussed, Pharmacy Topics Reviewed, Medications and their functions  [PCP d/c glipizde at visit in August.Receiving assistance for mounjaro and dexcom sensors.Taking medications as directed without any concerns.Receiving eye injections & had to switch medications due to insurance. Previous medication seemed to work better.]  Safety Interventions   Safety Discussed/Reviewed Safety Discussed, Safety Reviewed, Fall Risk  [practice fall precautions]       Follow up plan: Follow up call scheduled for 05/05/23    Encounter Outcome:  Patient Visit Completed   Demetrios Loll, RN, BSN Care Management Coordinator [facility]  [facility] Direct Dial: [phone] Main #: [phone]

## 2023-04-20 ENCOUNTER — Other Ambulatory Visit (HOSPITAL_COMMUNITY): Payer: Self-pay | Admitting: Family Medicine

## 2023-04-20 DIAGNOSIS — Z1231 Encounter for screening mammogram for malignant neoplasm of breast: Secondary | ICD-10-CM

## 2023-05-04 DIAGNOSIS — L400 Psoriasis vulgaris: Secondary | ICD-10-CM | POA: Diagnosis not present

## 2023-05-04 DIAGNOSIS — T148XXA Other injury of unspecified body region, initial encounter: Secondary | ICD-10-CM | POA: Diagnosis not present

## 2023-05-05 ENCOUNTER — Encounter: Payer: Self-pay | Admitting: *Deleted

## 2023-05-05 ENCOUNTER — Ambulatory Visit: Payer: Self-pay | Admitting: *Deleted

## 2023-05-10 NOTE — Patient Outreach (Signed)
Care Coordination   Follow Up Visit Note   05/05/2023 Name: Kelli Summers MRN: [id number] DOB: 1956/01/09  Kelli Summers is a 67 y.o. year old female who sees Assunta Found, MD for primary care. I spoke with  [name] by phone today.  What matters to the patients health and wellness today?  Ongoing management of diabetes    Goals Addressed             This Visit's Progress    COMPLETED: Manage Chronic Kidney Disease       Care Coordination Goals: Patient will keep all medical appointments Patient will take medications as prescribed  Patient will avoid NSAIDs  Patient does not have any acute or urgent needs related to this goal and will follow-up with PCP regarding management.      COMPLETED: Manage Diabetes       Care Coordination Goals: Patient will continue to take medications as prescribed Patient will keep follow-up appointments with PCP and specialists Patient will continue to monitor and record blood sugar 3 times a day and PRN with Dexcom 7 and will call provider with any readings less than 70 or with 3 readings in a row over 200 Patient will eat 3 meals a day with 30GM of CHO and up to 2 snacks per day with less than 15GM of CHO  Patient does not have any acute or urgent needs related to this goal and will follow-up with PCP regarding management.         SDOH assessments and interventions completed:  Yes SDOH Interventions Today    Flowsheet Row Most Recent Value  SDOH Interventions   Transportation Interventions Intervention Not Indicated  Financial Strain Interventions Intervention Not Indicated       Care Coordination Interventions:  Yes, provided  Interventions Today    Flowsheet Row Most Recent Value  Chronic Disease   Chronic disease during today's visit Diabetes  General Interventions   General Interventions Discussed/Reviewed General Interventions Discussed, General Interventions Reviewed, Doctor Visits, Durable Medical Equipment  (DME), Labs  Labs Hgb A1c every 3 months  Doctor Visits Discussed/Reviewed Doctor Visits Discussed, Doctor Visits Reviewed, PCP, Specialist, Annual Wellness Visits  Durable Medical Equipment (DME) Glucomoter, Other  [Home reading: glucose 118 with Dexcom]  PCP/Specialist Visits Compliance with follow-up visit  [Keep follow-up with PCP as planned. follow up with PCP for any car management or resource needs.]  Education Interventions   Education Provided Provided Education  Provided Verbal Education On When to see the doctor, Nutrition, Labs, Blood Sugar Monitoring, Medication  Labs Reviewed Hgb A1c, Kidney Function  [02/09/23 Creatinine 1.3, BUN 20, eGFR 45  02/18/23 A1C 6.3%]  Nutrition Interventions   Nutrition Discussed/Reviewed Nutrition Discussed, Nutrition Reviewed, Carbohydrate meal planning, Portion sizes, Decreasing sugar intake, Increasing proteins, Adding fruits and vegetables, Fluid intake  [Eat 3 meals per day with 30 GM of CHO and up to 2 snacks per day, if needed, with less than 15 GM of CHO]  Pharmacy Interventions   Pharmacy Dicussed/Reviewed Pharmacy Topics Discussed, Pharmacy Topics Reviewed, Medications and their functions  [taking medications as prescribed. receiving assistance for dexcom sensors and mounjaro]       Follow up plan: No further intervention required. Patient's Primary Care office is not partnering with the [facility] for care management and will be providing Care Management Services themselves. This final Care Management note will be securely faxed to the PCP office for handoff. Patient has been encouraged to reach out to their PCP  office with any resource or care management needs.    Encounter Outcome:  Patient Visit Completed   Demetrios Loll, RN, BSN Care Management Coordinator [facility]  [facility] Direct Dial: [phone] Main #: [phone]

## 2023-05-13 DIAGNOSIS — I10 Essential (primary) hypertension: Secondary | ICD-10-CM | POA: Diagnosis not present

## 2023-05-13 DIAGNOSIS — E1122 Type 2 diabetes mellitus with diabetic chronic kidney disease: Secondary | ICD-10-CM | POA: Diagnosis not present

## 2023-05-13 DIAGNOSIS — Z789 Other specified health status: Secondary | ICD-10-CM | POA: Diagnosis not present

## 2023-05-13 DIAGNOSIS — E782 Mixed hyperlipidemia: Secondary | ICD-10-CM | POA: Diagnosis not present

## 2023-05-13 DIAGNOSIS — F17210 Nicotine dependence, cigarettes, uncomplicated: Secondary | ICD-10-CM | POA: Diagnosis not present

## 2023-05-13 DIAGNOSIS — Z23 Encounter for immunization: Secondary | ICD-10-CM | POA: Diagnosis not present

## 2023-05-13 DIAGNOSIS — Z6833 Body mass index (BMI) 33.0-33.9, adult: Secondary | ICD-10-CM | POA: Diagnosis not present

## 2023-05-13 DIAGNOSIS — E6609 Other obesity due to excess calories: Secondary | ICD-10-CM | POA: Diagnosis not present

## 2023-05-15 IMAGING — DX DG HUMERUS 2V *L*
3 series · 3 of 3 positions shown · non-contrast
Comparison: Intraoperative views same date. Left humerus
radiographs 07/15/2021.

CLINICAL DATA: Postop.

EXAM:
LEFT HUMERUS - 2+ VIEW

[humerus ap]
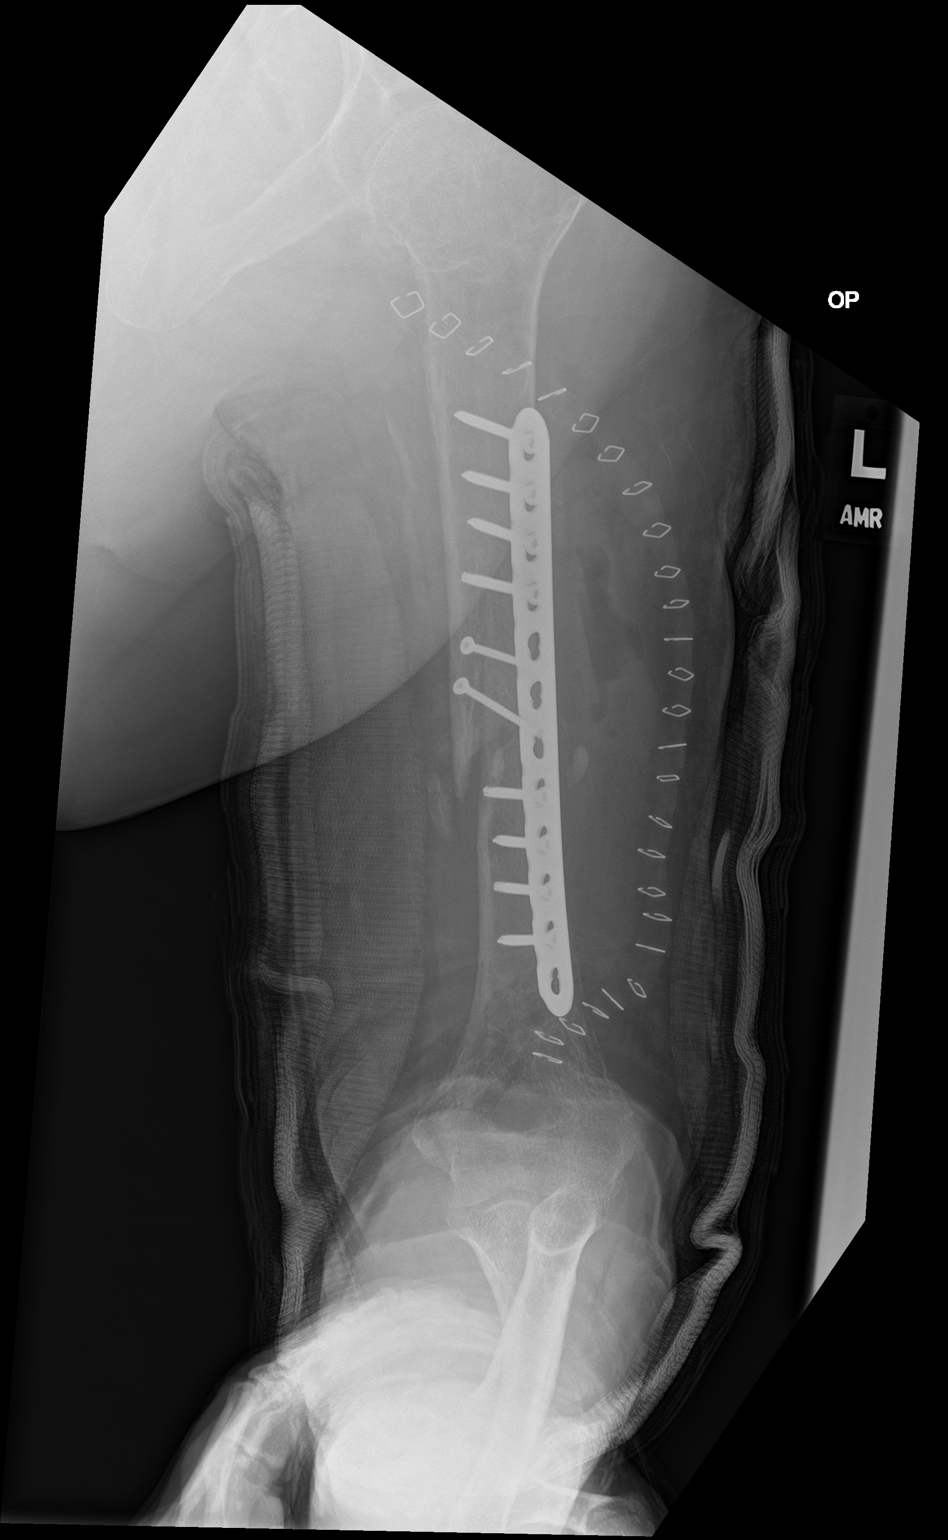

[humerus lat (1 of 2)]
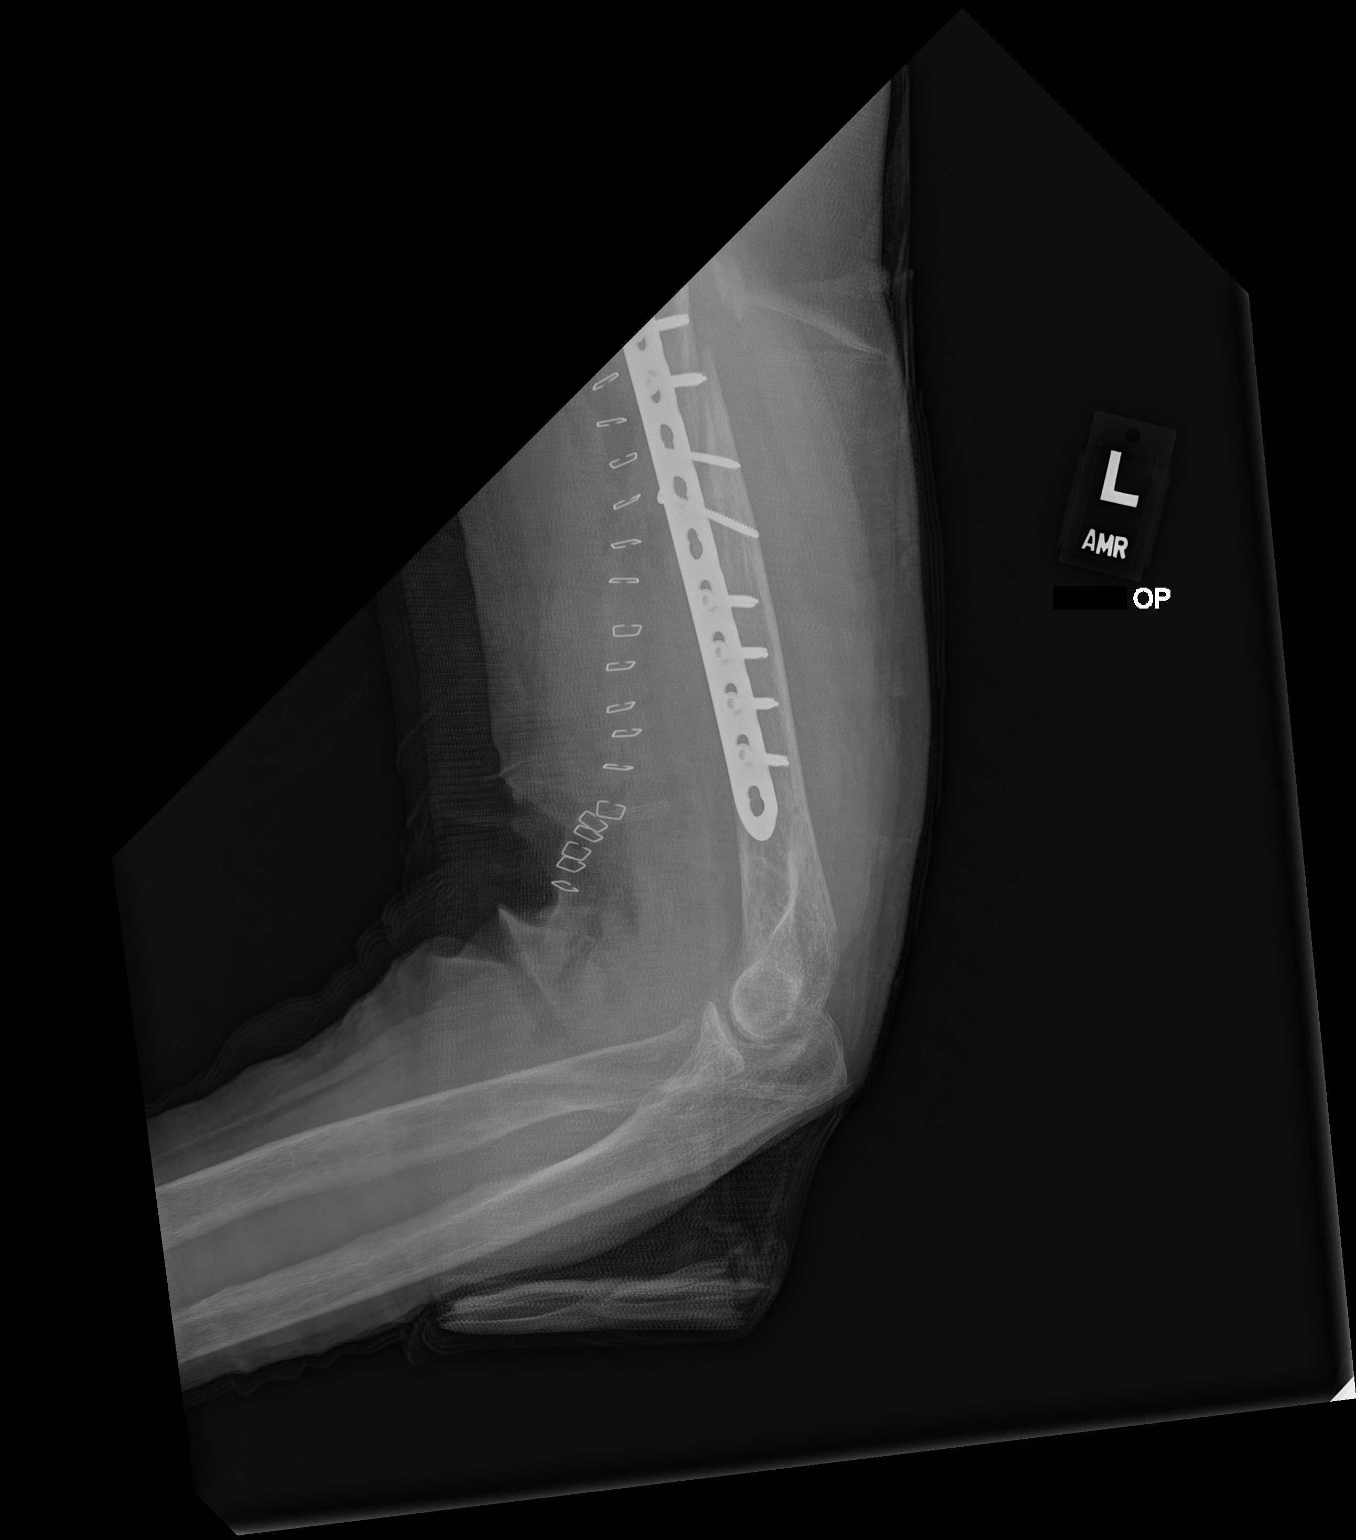

[humerus lat (2 of 2)]
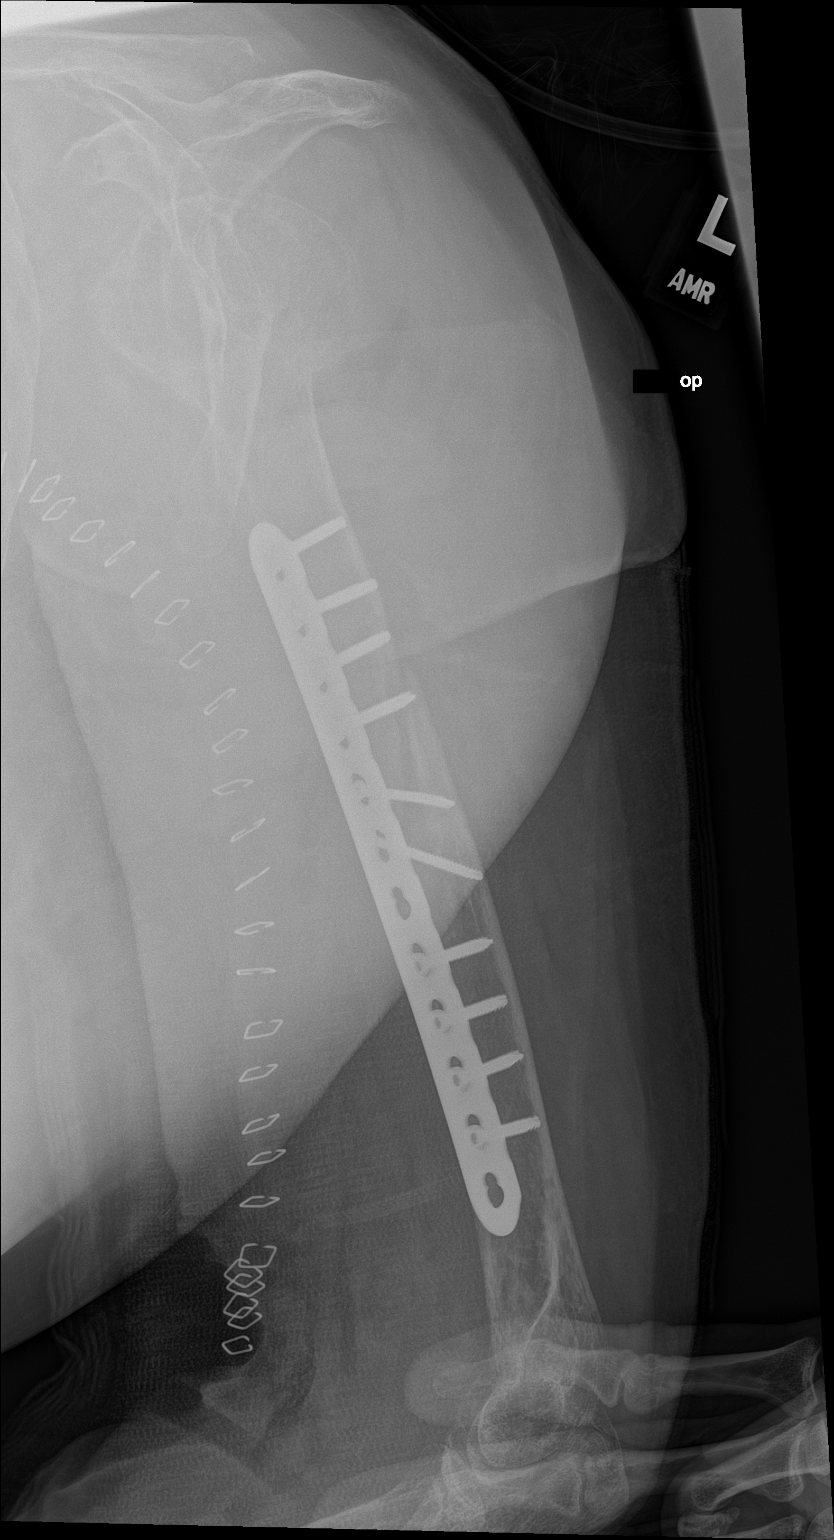

[3 of 3 positions shown; findings below may reference images not displayed]

FINDINGS: Status post anterolateral plate and screw fixation of the comminuted
fracture of the mid humeral shaft. The main fracture fragments
demonstrate improved alignment with approximately 6 mm of residual
lateral displacement. No intra-articular extension of the fracture
or complications are identified. Skin staples are in place with a
small amount of soft tissue emphysema.
IMPRESSION: Near anatomic reduction of comminuted mid left humeral fracture post
ORIF.

## 2023-05-16 ENCOUNTER — Encounter (HOSPITAL_COMMUNITY): Payer: Self-pay

## 2023-05-16 ENCOUNTER — Ambulatory Visit (HOSPITAL_COMMUNITY)
Admission: RE | Admit: 2023-05-16 | Discharge: 2023-05-16 | Disposition: A | Discharge status: Home / Self Care | Payer: Medicare HMO | Source: Ambulatory Visit | Attending: Family Medicine | Admitting: Family Medicine

## 2023-05-16 DIAGNOSIS — N1831 Chronic kidney disease, stage 3a: Secondary | ICD-10-CM | POA: Diagnosis not present

## 2023-05-16 DIAGNOSIS — E1122 Type 2 diabetes mellitus with diabetic chronic kidney disease: Secondary | ICD-10-CM | POA: Diagnosis not present

## 2023-05-16 DIAGNOSIS — Z1231 Encounter for screening mammogram for malignant neoplasm of breast: Secondary | ICD-10-CM | POA: Diagnosis not present

## 2023-05-16 DIAGNOSIS — D631 Anemia in chronic kidney disease: Secondary | ICD-10-CM | POA: Diagnosis not present

## 2023-05-16 DIAGNOSIS — R809 Proteinuria, unspecified: Secondary | ICD-10-CM | POA: Diagnosis not present

## 2023-05-17 DIAGNOSIS — E133213 Other specified diabetes mellitus with mild nonproliferative diabetic retinopathy with macular edema, bilateral: Secondary | ICD-10-CM | POA: Diagnosis not present

## 2023-05-18 ENCOUNTER — Other Ambulatory Visit (HOSPITAL_COMMUNITY): Payer: Self-pay | Admitting: Family Medicine

## 2023-05-18 DIAGNOSIS — E7849 Other hyperlipidemia: Secondary | ICD-10-CM | POA: Diagnosis not present

## 2023-05-18 DIAGNOSIS — R928 Other abnormal and inconclusive findings on diagnostic imaging of breast: Secondary | ICD-10-CM

## 2023-05-18 DIAGNOSIS — J069 Acute upper respiratory infection, unspecified: Secondary | ICD-10-CM | POA: Diagnosis not present

## 2023-05-23 DIAGNOSIS — E1122 Type 2 diabetes mellitus with diabetic chronic kidney disease: Secondary | ICD-10-CM | POA: Diagnosis not present

## 2023-05-23 DIAGNOSIS — N2581 Secondary hyperparathyroidism of renal origin: Secondary | ICD-10-CM | POA: Diagnosis not present

## 2023-05-23 DIAGNOSIS — N1831 Chronic kidney disease, stage 3a: Secondary | ICD-10-CM | POA: Diagnosis not present

## 2023-05-23 DIAGNOSIS — I129 Hypertensive chronic kidney disease with stage 1 through stage 4 chronic kidney disease, or unspecified chronic kidney disease: Secondary | ICD-10-CM | POA: Diagnosis not present

## 2023-05-24 ENCOUNTER — Ambulatory Visit (HOSPITAL_COMMUNITY)
Admission: RE | Admit: 2023-05-24 | Discharge: 2023-05-24 | Disposition: A | Discharge status: Home / Self Care | Payer: Medicare HMO | Source: Ambulatory Visit | Attending: Family Medicine | Admitting: Family Medicine

## 2023-05-24 DIAGNOSIS — R928 Other abnormal and inconclusive findings on diagnostic imaging of breast: Secondary | ICD-10-CM | POA: Diagnosis not present

## 2023-05-24 DIAGNOSIS — R92313 Mammographic fatty tissue density, bilateral breasts: Secondary | ICD-10-CM | POA: Diagnosis not present

## 2023-05-28 DIAGNOSIS — E1122 Type 2 diabetes mellitus with diabetic chronic kidney disease: Secondary | ICD-10-CM | POA: Diagnosis not present

## 2023-05-28 DIAGNOSIS — E782 Mixed hyperlipidemia: Secondary | ICD-10-CM | POA: Diagnosis not present

## 2023-05-28 DIAGNOSIS — G720 Drug-induced myopathy: Secondary | ICD-10-CM | POA: Diagnosis not present

## 2023-05-28 DIAGNOSIS — I10 Essential (primary) hypertension: Secondary | ICD-10-CM | POA: Diagnosis not present

## 2023-06-01 DIAGNOSIS — F17211 Nicotine dependence, cigarettes, in remission: Secondary | ICD-10-CM | POA: Diagnosis not present

## 2023-06-17 DIAGNOSIS — H52209 Unspecified astigmatism, unspecified eye: Secondary | ICD-10-CM | POA: Diagnosis not present

## 2023-06-17 DIAGNOSIS — H5203 Hypermetropia, bilateral: Secondary | ICD-10-CM | POA: Diagnosis not present

## 2023-06-17 DIAGNOSIS — H524 Presbyopia: Secondary | ICD-10-CM | POA: Diagnosis not present

## 2023-06-28 DIAGNOSIS — E113313 Type 2 diabetes mellitus with moderate nonproliferative diabetic retinopathy with macular edema, bilateral: Secondary | ICD-10-CM | POA: Diagnosis not present

## 2023-08-09 DIAGNOSIS — Z961 Presence of intraocular lens: Secondary | ICD-10-CM | POA: Diagnosis not present

## 2023-08-09 DIAGNOSIS — H35372 Puckering of macula, left eye: Secondary | ICD-10-CM | POA: Diagnosis not present

## 2023-08-09 DIAGNOSIS — H43813 Vitreous degeneration, bilateral: Secondary | ICD-10-CM | POA: Diagnosis not present

## 2023-08-09 DIAGNOSIS — H35033 Hypertensive retinopathy, bilateral: Secondary | ICD-10-CM | POA: Diagnosis not present

## 2023-08-09 DIAGNOSIS — E113313 Type 2 diabetes mellitus with moderate nonproliferative diabetic retinopathy with macular edema, bilateral: Secondary | ICD-10-CM | POA: Diagnosis not present

## 2023-09-13 DIAGNOSIS — E1122 Type 2 diabetes mellitus with diabetic chronic kidney disease: Secondary | ICD-10-CM | POA: Diagnosis not present

## 2023-09-13 DIAGNOSIS — J0100 Acute maxillary sinusitis, unspecified: Secondary | ICD-10-CM | POA: Diagnosis not present

## 2023-09-13 DIAGNOSIS — I10 Essential (primary) hypertension: Secondary | ICD-10-CM | POA: Diagnosis not present

## 2023-09-13 DIAGNOSIS — M80022P Age-related osteoporosis with current pathological fracture, left humerus, subsequent encounter for fracture with malunion: Secondary | ICD-10-CM | POA: Diagnosis not present

## 2023-10-04 DIAGNOSIS — E113313 Type 2 diabetes mellitus with moderate nonproliferative diabetic retinopathy with macular edema, bilateral: Secondary | ICD-10-CM | POA: Diagnosis not present

## 2023-10-07 DIAGNOSIS — N189 Chronic kidney disease, unspecified: Secondary | ICD-10-CM | POA: Diagnosis not present

## 2023-10-07 DIAGNOSIS — R809 Proteinuria, unspecified: Secondary | ICD-10-CM | POA: Diagnosis not present

## 2023-10-07 DIAGNOSIS — D631 Anemia in chronic kidney disease: Secondary | ICD-10-CM | POA: Diagnosis not present

## 2023-10-14 DIAGNOSIS — E1129 Type 2 diabetes mellitus with other diabetic kidney complication: Secondary | ICD-10-CM | POA: Diagnosis not present

## 2023-10-14 DIAGNOSIS — R809 Proteinuria, unspecified: Secondary | ICD-10-CM | POA: Diagnosis not present

## 2023-10-14 DIAGNOSIS — E1122 Type 2 diabetes mellitus with diabetic chronic kidney disease: Secondary | ICD-10-CM | POA: Diagnosis not present

## 2023-10-14 DIAGNOSIS — N1831 Chronic kidney disease, stage 3a: Secondary | ICD-10-CM | POA: Diagnosis not present

## 2023-10-28 DIAGNOSIS — E1122 Type 2 diabetes mellitus with diabetic chronic kidney disease: Secondary | ICD-10-CM | POA: Diagnosis not present

## 2023-11-11 DIAGNOSIS — I10 Essential (primary) hypertension: Secondary | ICD-10-CM | POA: Diagnosis not present

## 2023-11-11 DIAGNOSIS — F17201 Nicotine dependence, unspecified, in remission: Secondary | ICD-10-CM | POA: Diagnosis not present

## 2023-11-11 DIAGNOSIS — E6609 Other obesity due to excess calories: Secondary | ICD-10-CM | POA: Diagnosis not present

## 2023-11-11 DIAGNOSIS — D518 Other vitamin B12 deficiency anemias: Secondary | ICD-10-CM | POA: Diagnosis not present

## 2023-11-11 DIAGNOSIS — E1122 Type 2 diabetes mellitus with diabetic chronic kidney disease: Secondary | ICD-10-CM | POA: Diagnosis not present

## 2023-11-11 DIAGNOSIS — Z0001 Encounter for general adult medical examination with abnormal findings: Secondary | ICD-10-CM | POA: Diagnosis not present

## 2023-11-11 DIAGNOSIS — E782 Mixed hyperlipidemia: Secondary | ICD-10-CM | POA: Diagnosis not present

## 2023-11-11 DIAGNOSIS — M80022P Age-related osteoporosis with current pathological fracture, left humerus, subsequent encounter for fracture with malunion: Secondary | ICD-10-CM | POA: Diagnosis not present

## 2023-11-11 DIAGNOSIS — M549 Dorsalgia, unspecified: Secondary | ICD-10-CM | POA: Diagnosis not present

## 2023-11-14 ENCOUNTER — Other Ambulatory Visit (HOSPITAL_COMMUNITY): Payer: Self-pay | Admitting: Family Medicine

## 2023-11-14 DIAGNOSIS — M810 Age-related osteoporosis without current pathological fracture: Secondary | ICD-10-CM

## 2023-11-15 ENCOUNTER — Ambulatory Visit (HOSPITAL_COMMUNITY)
Admission: RE | Admit: 2023-11-15 | Discharge: 2023-11-15 | Disposition: A | Discharge status: Home / Self Care | Source: Ambulatory Visit | Attending: Family Medicine | Admitting: Family Medicine

## 2023-11-15 DIAGNOSIS — M810 Age-related osteoporosis without current pathological fracture: Secondary | ICD-10-CM | POA: Diagnosis not present

## 2023-11-15 DIAGNOSIS — Z780 Asymptomatic menopausal state: Secondary | ICD-10-CM | POA: Diagnosis not present

## 2023-11-26 DIAGNOSIS — E1122 Type 2 diabetes mellitus with diabetic chronic kidney disease: Secondary | ICD-10-CM | POA: Diagnosis not present

## 2023-11-26 DIAGNOSIS — M80022P Age-related osteoporosis with current pathological fracture, left humerus, subsequent encounter for fracture with malunion: Secondary | ICD-10-CM | POA: Diagnosis not present

## 2023-12-07 DIAGNOSIS — H35372 Puckering of macula, left eye: Secondary | ICD-10-CM | POA: Diagnosis not present

## 2023-12-07 DIAGNOSIS — E113311 Type 2 diabetes mellitus with moderate nonproliferative diabetic retinopathy with macular edema, right eye: Secondary | ICD-10-CM | POA: Diagnosis not present

## 2023-12-07 DIAGNOSIS — H43813 Vitreous degeneration, bilateral: Secondary | ICD-10-CM | POA: Diagnosis not present

## 2023-12-07 DIAGNOSIS — H4312 Vitreous hemorrhage, left eye: Secondary | ICD-10-CM | POA: Diagnosis not present

## 2023-12-07 DIAGNOSIS — H35033 Hypertensive retinopathy, bilateral: Secondary | ICD-10-CM | POA: Diagnosis not present

## 2023-12-07 DIAGNOSIS — E113512 Type 2 diabetes mellitus with proliferative diabetic retinopathy with macular edema, left eye: Secondary | ICD-10-CM | POA: Diagnosis not present

## 2023-12-07 DIAGNOSIS — Z961 Presence of intraocular lens: Secondary | ICD-10-CM | POA: Diagnosis not present

## 2023-12-13 DIAGNOSIS — E113512 Type 2 diabetes mellitus with proliferative diabetic retinopathy with macular edema, left eye: Secondary | ICD-10-CM | POA: Diagnosis not present

## 2023-12-13 DIAGNOSIS — H4312 Vitreous hemorrhage, left eye: Secondary | ICD-10-CM | POA: Diagnosis not present

## 2023-12-13 DIAGNOSIS — H35033 Hypertensive retinopathy, bilateral: Secondary | ICD-10-CM | POA: Diagnosis not present

## 2023-12-13 DIAGNOSIS — H35372 Puckering of macula, left eye: Secondary | ICD-10-CM | POA: Diagnosis not present

## 2023-12-13 DIAGNOSIS — Z961 Presence of intraocular lens: Secondary | ICD-10-CM | POA: Diagnosis not present

## 2023-12-13 DIAGNOSIS — E113411 Type 2 diabetes mellitus with severe nonproliferative diabetic retinopathy with macular edema, right eye: Secondary | ICD-10-CM | POA: Diagnosis not present

## 2023-12-13 DIAGNOSIS — H43813 Vitreous degeneration, bilateral: Secondary | ICD-10-CM | POA: Diagnosis not present

## 2024-01-12 DIAGNOSIS — E6609 Other obesity due to excess calories: Secondary | ICD-10-CM | POA: Diagnosis not present

## 2024-01-12 DIAGNOSIS — Z6832 Body mass index (BMI) 32.0-32.9, adult: Secondary | ICD-10-CM | POA: Diagnosis not present

## 2024-01-12 DIAGNOSIS — E559 Vitamin D deficiency, unspecified: Secondary | ICD-10-CM | POA: Diagnosis not present

## 2024-01-12 DIAGNOSIS — R0789 Other chest pain: Secondary | ICD-10-CM | POA: Diagnosis not present

## 2024-01-20 ENCOUNTER — Ambulatory Visit: Attending: Cardiovascular Disease | Admitting: Cardiovascular Disease

## 2024-01-20 ENCOUNTER — Encounter: Payer: Self-pay | Admitting: Cardiovascular Disease

## 2024-01-20 VITALS — BP 122/62 | HR 93 | Ht 64.0 in | Wt 190.0 lb

## 2024-01-20 DIAGNOSIS — Z794 Long term (current) use of insulin: Secondary | ICD-10-CM

## 2024-01-20 DIAGNOSIS — Z01812 Encounter for preprocedural laboratory examination: Secondary | ICD-10-CM | POA: Diagnosis not present

## 2024-01-20 DIAGNOSIS — N1831 Chronic kidney disease, stage 3a: Secondary | ICD-10-CM | POA: Diagnosis not present

## 2024-01-20 DIAGNOSIS — E119 Type 2 diabetes mellitus without complications: Secondary | ICD-10-CM | POA: Diagnosis not present

## 2024-01-20 DIAGNOSIS — R079 Chest pain, unspecified: Secondary | ICD-10-CM

## 2024-01-20 MED ORDER — METOPROLOL TARTRATE 100 MG PO TABS: 100.0000 mg | ORAL_TABLET | Freq: Once | ORAL | 0 refills | Status: AC

## 2024-01-20 NOTE — Patient Instructions (Addendum)
 Medication Instructions:  Your physician recommends that you continue on your current medications as directed. Please refer to the Current Medication list given to you today.  *If you need a refill on your cardiac medications before your next appointment, please call your pharmacy*  Lab Work: TODAY: BMET If you have labs (blood work) drawn today and your tests are completely normal, you will receive your results only by: MyChart Message (if you have MyChart) OR A paper copy in the mail If you have any lab test that is abnormal or we need to change your treatment, we will call you to review the results.  Testing/Procedures: Your physician has requested that you have cardiac CT. Cardiac computed tomography (CT) is a painless test that uses an x-ray machine to take clear, detailed pictures of your heart. For further information please visit [email or web address] Please follow instruction sheet as given.  Follow-Up: At [facility], you and your health needs are our priority.  As part of our continuing mission to provide you with exceptional heart care, our providers are all part of one team.  This team includes your primary Cardiologist (physician) and Advanced Practice Providers or APPs (Physician Assistants and Nurse Practitioners) who all work together to provide you with the care you need, when you need it.  Your next appointment:   1 year(s)  Provider:   Jerel Balding, MD  We recommend signing up for the patient portal called MyChart.  Sign up information is provided on this After Visit Summary.  MyChart is used to connect with patients for Virtual Visits (Telemedicine).  Patients are able to view lab/test results, encounter notes, upcoming appointments, etc.  Non-urgent messages can be sent to your provider as well.   To learn more about what you can do with MyChart, go to [email or web address].   Other Instructions   Your cardiac CT will be scheduled at one of the below  locations:   [facility] [address] [place], [place] [address] [phone]  OR   [facility] [address]  [place], [place] [address]  If scheduled at [facility], please arrive at the [facility] and Children's Entrance (Entrance C2) of [facility] 30 minutes prior to test start time. You can use the FREE valet parking offered at entrance C (encouraged to control the heart rate for the test). Proceed to the [facility] Radiology Department (first floor) to check-in and test prep.  All radiology patients and guests should use entrance C2 at [facility], accessed from [facility], even though the hospital's physical address listed is [address].  If scheduled at the Heart and Vascular Tower at [address], please enter the parking lot using the [address] entrance and use the FREE valet service at the patient drop-off area. Enter the buidling and check-in with registration on the main floor.  Please follow these instructions carefully (unless otherwise directed):  An IV will be required for this test and Nitroglycerin will be given.   On the Night Before the Test: Be sure to Drink plenty of water. Do not consume any caffeinated/decaffeinated beverages or chocolate 12 hours prior to your test. Do not take any antihistamines 12 hours prior to your test. Take 1/2 of your dose of Lantus  the night before your CT scan  On the Day of the Test: Drink plenty of water until 1 hour prior to the test. Do not eat any food 1 hour prior  to test. You may take your regular medications prior to the test.  Take metoprolol  (Lopressor ) 100 mg two hours prior to test. Do not take your losartan the morning of your CT scan. Patients who wear a continuous glucose monitor MUST remove the device prior to scanning. FEMALES- please wear underwire-free bra if available, avoid dresses & tight clothing       After the Test: Drink plenty of water. After receiving IV contrast, you may experience a mild flushed feeling. This is normal. On occasion, you may experience a mild rash up to 24 hours after the test. This is not dangerous. If this occurs, you can take Benadryl  25 mg, Zyrtec, Claritin , or Allegra and increase your fluid intake. (Patients taking Tikosyn should avoid Benadryl , and may take Zyrtec, Claritin , or Allegra) If you experience trouble breathing, this can be serious. If it is severe call 911 IMMEDIATELY. If it is mild, please call our office.  We will call to schedule your test 2-4 weeks out understanding that some insurance companies will need an authorization prior to the service being performed.   For more information and frequently asked questions, please visit our website : [email or web address]  For non-scheduling related questions, please contact the cardiac imaging nurse navigator should you have any questions/concerns: Cardiac Imaging Nurse Navigators Direct Office Dial: [phone]   For scheduling needs, including cancellations and rescheduling, please call [place], [phone].

## 2024-01-20 NOTE — Progress Notes (Signed)
 Cardiology Office Note:    Date:  01/22/2024   ID:  Ovida P Yates, DOB 11-22-1955, MRN [id number]  PCP:  Marvine Rush, MD   [facility] Providers Cardiologist:  Jerel Balding, MD     Referring MD: Marvine Rush, MD   No chief complaint on file. Kelli Summers is a 68 y.o. female who is being seen today for the evaluation of chest pain at the request of Marvine Rush, MD.   History of Present Illness:    Kelli Summers is a 68 y.o. female with a hx of insulin  requiring type 2 diabetes mellitus, chronic kidney disease stage III, mild carotid artery stenosis referred in consultation for chest pain.  She describes occasional episodes of left-sided chest pain feeling like a dull constriction.  The episodes occur at rest and do not occur during activity (she is fairly sedentary but does do housework, without chest discomfort).  Sometimes it feels like a pulled muscle and it can worsen if she coughs or sneezes.  She did take nitroglycerin on 1 occasion and it did not seem to help.  She was evaluated for sinus tachycardia in 2014 and had a normal echocardiogram at the time.  In 2019 findings of calcification of the internal carotid artery on CT led to ultrasound evaluation: initially estimated to be 40-59%, but felt to be less than 40% on a repeat echocardiogram a year later.  She has moderate reduction in ABI bilaterally, but does not have intermittent claudication.  She has been on insulin  for almost 30 years and her most recent hemoglobin A1c was 6.0%.  She has a pretty good lipid profile with LDL of 69, HDL 45 and normal triglycerides, without lipid-lowering medications.  She has a roughly 20-pack-year history of smoking but quit in 2013.  Her family history is not significant for premature CAD.  She does not have hypertension, she takes losartan for kidney protection.  She has a tendency to develop hyperkalemia.  Her nephrologist is Dr. Rachele.  In 2014 she had VATS, pleural  decortication and right lower lobe wedge resection after presenting with empyema and sepsis.  Past Medical History:  Diagnosis Date   Arthritis    Asthma    Chronic back pain    Chronic knee pain    Complication of anesthesia    didnt wake up for 3 days after wedge resection in 2014   Depression    Diabetes mellitus    Dysrhythmia    Ejection fraction    History of stress test 01/2010   normal myoview stress test   Hypertension    Sinus tachycardia     Past Surgical History:  Procedure Laterality Date   ABDOMINAL HYSTERECTOMY     BACK SURGERY     CATARACT EXTRACTION W/PHACO Right 01/12/2019   Procedure: CATARACT EXTRACTION PHACO AND INTRAOCULAR LENS PLACEMENT RIGHT EYE;  Surgeon: Harrie Agent, MD;  Location: [facility];  Service: Ophthalmology;  Laterality: Right;  right   CATARACT EXTRACTION W/PHACO Left 01/26/2019   Procedure: CATARACT EXTRACTION PHACO AND INTRAOCULAR LENS PLACEMENT (IOC);  Surgeon: Harrie Agent, MD;  Location: [facility];  Service: Ophthalmology;  Laterality: Left;  CDE: 7.07   CHOLECYSTECTOMY     COLONOSCOPY N/A 04/15/2015   Procedure: COLONOSCOPY;  Surgeon: Oneil Budge, MD;  Location: [facility];  Service: Gastroenterology;  Laterality: N/A;   CYST EXCISION Left 04/02/2016   Procedure: EXCISION 1 CM CYST ON LEFT SIDE OF FACE;  Surgeon: Oneil Budge, MD;  Location:  [facility];  Service: General;  Laterality: Left;   ORIF HUMERUS FRACTURE Left 07/28/2021   Procedure: OPEN REDUCTION INTERNAL FIXATION (ORIF) HUMERUS FRACTURE;  Surgeon: Margrette Taft BRAVO, MD;  Location: [facility];  Service: Orthopedics;  Laterality: Left;   REDUCTION MAMMAPLASTY     VIDEO ASSISTED THORACOSCOPY (VATS)/DECORTICATION Right 05/09/2013   Procedure: VIDEO ASSISTED THORACOSCOPY (VATS)/DECORTICATION;  Surgeon: Elspeth JAYSON Millers, MD;  Location: [facility] OR;  Service: Thoracic;  Laterality: Right;   WEDGE RESECTION Right 05/09/2013   Procedure: WEDGE RESECTION RIGHT LOWER LOBE;  Surgeon: Elspeth JAYSON Millers, MD;  Location: [facility] OR;  Service: Thoracic;  Laterality: Right;    Current Medications: Current Meds  Medication Sig   albuterol  (VENTOLIN  HFA) 108 (90 Base) MCG/ACT inhaler 1-2 puffs every 6 (six) hours as needed for wheezing or shortness of breath.   Ascorbic Acid (VITAMIN C) 500 MG CAPS Take 500 mg by mouth daily at 6 (six) AM.   Calcium Carb-Cholecalciferol (CALCIUM 600+D3 PO) Take 1 tablet by mouth daily at 6 (six) AM.   Cholecalciferol (D3 PO) Take 50 mg by mouth daily at 6 (six) AM.   Continuous Glucose Sensor (DEXCOM G7 SENSOR) MISC    diazepam  (VALIUM ) 5 MG tablet Take 5 mg by mouth 2 (two) times daily as needed.   fluticasone  (FLONASE ) 50 MCG/ACT nasal spray Place 1-2 sprays into both nostrils daily as needed (nasal congestion.).   LANTUS  SOLOSTAR 100 UNIT/ML Solostar Pen Inject 50 Units into the skin at bedtime. (Patient taking differently: Inject 40 Units into the skin at bedtime.)   losartan (COZAAR) 100 MG tablet Take 100 mg by mouth in the morning.   metoprolol  tartrate (LOPRESSOR ) 100 MG tablet Take 1 tablet (100 mg total) by mouth once for 1 dose. Take 90-120 minutes prior to scan. Hold for SBP less than 110.   metoprolol  tartrate (LOPRESSOR ) 25 MG tablet Take 25 mg by mouth 2 (two) times daily.   MOUNJARO 10 MG/0.5ML Pen Inject into the skin once a week.   Multiple Vitamins-Minerals (ONE DAILY WOMENS 50 PLUS) TABS Take 1 tablet by mouth daily at 6 (six) AM.   OVER THE COUNTER MEDICATION Take 1 tablet by mouth at bedtime.     Allergies:   Penicillins, Ciprofloxacin, Hydrocodone -acetaminophen , Percocet [oxycodone -acetaminophen ], and Biotin   Social History   Socioeconomic History   Marital status: Divorced    Spouse name: Not on file   Number of children: Not on file   Years of education: Not on file   Highest education level: Not on file  Occupational History   Not on file  Tobacco Use   Smoking status: Former    Current packs/day: 0.00    Average  packs/day: 0.5 packs/day for 40.0 years (20.0 ttl pk-yrs)    Types: Cigarettes    Start date: 04/28/1972    Quit date: 04/28/2012    Years since quitting: 11.7    Passive exposure: Never   Smokeless tobacco: Former  Building services engineer status: Never Used  Substance and Sexual Activity   Alcohol  use: No   Drug use: No   Sexual activity: Not Currently    Birth control/protection: Surgical  Other Topics Concern   Not on file  Social History Narrative   Not on file   Social Drivers of Health   Financial Resource Strain: Low Risk  (05/05/2023)   Overall Financial Resource Strain (CARDIA)    Difficulty of Paying Living Expenses: Not very hard  Food Insecurity: No Food  Insecurity (08/31/2022)   Hunger Vital Sign    Worried About Running Out of Food in the Last Year: Never true    Ran Out of Food in the Last Year: Never true  Transportation Needs: No Transportation Needs (05/05/2023)   PRAPARE - Administrator, Civil Service (Medical): No    Lack of Transportation (Non-Medical): No  Physical Activity: Not on file  Stress: Not on file  Social Connections: Unknown (11/06/2021)   Received from [facility]   Social Network    Social Network: Not on file     Family History: The patient's family history includes Breast cancer in her maternal aunt, maternal aunt, maternal aunt, maternal aunt, and maternal grandmother; Emphysema in her maternal grandfather.  ROS:   Please see the history of present illness.     All other systems reviewed and are negative.  EKGs/Labs/Other Studies Reviewed:    The following studies were reviewed today:  EKG Interpretation Date/Time:  Friday January 20 2024 15:36:24 EDT Ventricular Rate:  93 PR Interval:  166 QRS Duration:  80 QT Interval:  356 QTC Calculation: 442 R Axis:   46  Text Interpretation: Normal sinus rhythm Low voltage QRS When compared with ECG of 23-Jul-2021 11:19, Premature ventricular complexes are no longer Present  Premature supraventricular complexes are no longer Present QT has shortened Confirmed by Tawyna Pellot [phone]) on 01/20/2024 4:07:43 PM    Recent Labs: No results found for requested labs within last 365 days.  Recent Lipid Panel    Component Value Date/Time   CHOL  01/27/2010 0425    130        ATP III CLASSIFICATION:  <200     mg/dL   Desirable  799-760  mg/dL   Borderline High  >=759    mg/dL   High          TRIG 805 (H) 01/27/2010 0425   HDL 38 (L) 01/27/2010 0425   CHOLHDL 3.4 01/27/2010 0425   VLDL 39 01/27/2010 0425   LDLCALC  01/27/2010 0425    53        Total Cholesterol/HDL:CHD Risk Coronary Heart Disease Risk Table                     Men   Women  1/2 Average Risk   3.4   3.3  Average Risk       5.0   4.4  2 X Average Risk   9.6   7.1  3 X Average Risk  23.4   11.0        Use the calculated Patient Ratio above and the CHD Risk Table to determine the patient's CHD Risk.        ATP III CLASSIFICATION (LDL):  <100     mg/dL   Optimal  899-870  mg/dL   Near or Above                    Optimal  130-159  mg/dL   Borderline  839-810  mg/dL   High  >809     mg/dL   Very High     Risk Assessment/Calculations:           Physical Exam:    VS:  BP 122/62 (BP Location: Left Arm, Patient Position: Sitting, Cuff Size: Large)   Pulse 93   Ht 5' 4 (1.626 m)   Wt 190 lb (86.2 kg)   SpO2 97%   BMI 32.61 kg/m  Wt Readings from Last 3 Encounters:  01/20/24 190 lb (86.2 kg)  02/23/23 205 lb (93 kg)  08/25/22 205 lb 1.6 oz (93 kg)     GEN: Mildly obese, well nourished, well developed in no acute distress HEENT: Normal NECK: No JVD; No carotid bruits LYMPHATICS: No lymphadenopathy CARDIAC: RRR, no murmurs, rubs, gallops.  Diminished pedal pulses RESPIRATORY:  Clear to auscultation without rales, wheezing or rhonchi  ABDOMEN: Soft, non-tender, non-distended MUSCULOSKELETAL:  No edema; No deformity  SKIN: Warm and dry NEUROLOGIC:  Alert and oriented x  3 PSYCHIATRIC:  Normal affect   ASSESSMENT:    1. Chest pain of uncertain etiology   2. Pre-procedure lab exam    PLAN:    In order of problems listed above:  Chest pain: Atypical but she has clear evidence of established CAD and has is not requiring diabetes mellitus.  Will schedule for a coronary CT angiogram.  She has a remarkably good lipid profile considering longstanding diabetes and obesity.  DM: Type II but with history of DKA in the setting of sepsis and requiring insulin . CKD 3A: Followed by Dr. Rachele, most recent creatinine 1.24 on 10/28/2023, corresponding to a creatinine clearance of approximately 45-50.  It sounds like she may also have renal tubular acidosis type IV, with a venous CO2 that runs in the low 20s and a potassium that is occasionally over 5, but without need for medical therapy.  Will check her metabolic panel before we proceed with the CT angiogram, but I think she can safely have the procedure.           Medication Adjustments/Labs and Tests Ordered: Current medicines are reviewed at length with the patient today.  Concerns regarding medicines are outlined above.  Orders Placed This Encounter  Procedures   CT CORONARY MORPH W/CTA COR W/SCORE W/CA W/CM &/OR WO/CM   Basic metabolic panel with GFR   EKG 87-Ozji   Meds ordered this encounter  Medications   metoprolol  tartrate (LOPRESSOR ) 100 MG tablet    Sig: Take 1 tablet (100 mg total) by mouth once for 1 dose. Take 90-120 minutes prior to scan. Hold for SBP less than 110.    Dispense:  1 tablet    Refill:  0    Patient Instructions  Medication Instructions:  Your physician recommends that you continue on your current medications as directed. Please refer to the Current Medication list given to you today.  *If you need a refill on your cardiac medications before your next appointment, please call your pharmacy*  Lab Work: TODAY: BMET If you have labs (blood work) drawn today and your tests are  completely normal, you will receive your results only by: MyChart Message (if you have MyChart) OR A paper copy in the mail If you have any lab test that is abnormal or we need to change your treatment, we will call you to review the results.  Testing/Procedures: Your physician has requested that you have cardiac CT. Cardiac computed tomography (CT) is a painless test that uses an x-ray machine to take clear, detailed pictures of your heart. For further information please visit [email or web address] Please follow instruction sheet as given.  Follow-Up: At [facility], you and your health needs are our priority.  As part of our continuing mission to provide you with exceptional heart care, our providers are all part of one team.  This team includes your primary Cardiologist (physician) and Advanced Practice Providers or APPs (Physician Assistants and Nurse Practitioners) who  all work together to provide you with the care you need, when you need it.  Your next appointment:   1 year(s)  Provider:   Jerel Balding, MD  We recommend signing up for the patient portal called MyChart.  Sign up information is provided on this After Visit Summary.  MyChart is used to connect with patients for Virtual Visits (Telemedicine).  Patients are able to view lab/test results, encounter notes, upcoming appointments, etc.  Non-urgent messages can be sent to your provider as well.   To learn more about what you can do with MyChart, go to [email or web address].   Other Instructions   Your cardiac CT will be scheduled at one of the below locations:   [facility] [address] [place], [place] [address] [phone]  OR   [facility] [address]  [place], [place] [address]  If scheduled at [facility], please arrive at the [facility] and Children's Entrance (Entrance C2) of [facility] 30 minutes prior to test start time. You can use  the FREE valet parking offered at entrance C (encouraged to control the heart rate for the test). Proceed to the [facility] Radiology Department (first floor) to check-in and test prep.  All radiology patients and guests should use entrance C2 at [facility], accessed from [facility], even though the hospital's physical address listed is [address].  If scheduled at the Heart and Vascular Tower at [address], please enter the parking lot using the [address] entrance and use the FREE valet service at the patient drop-off area. Enter the buidling and check-in with registration on the main floor.  Please follow these instructions carefully (unless otherwise directed):  An IV will be required for this test and Nitroglycerin will be given.   On the Night Before the Test: Be sure to Drink plenty of water. Do not consume any caffeinated/decaffeinated beverages or chocolate 12 hours prior to your test. Do not take any antihistamines 12 hours prior to your test. Take 1/2 of your dose of Lantus  the night before your CT scan  On the Day of the Test: Drink plenty of water until 1 hour prior to the test. Do not eat any food 1 hour prior to test. You may take your regular medications prior to the test.  Take metoprolol  (Lopressor ) 100 mg two hours prior to test. Do not take your losartan the morning of your CT scan. Patients who wear a continuous glucose monitor MUST remove the device prior to scanning. FEMALES- please wear underwire-free bra if available, avoid dresses & tight clothing      After the Test: Drink plenty of water. After receiving IV contrast, you may experience a mild flushed feeling. This is normal. On occasion, you may experience a mild rash up to 24 hours after the test. This is not dangerous. If this occurs, you can take Benadryl  25 mg, Zyrtec, Claritin , or Allegra and increase your fluid intake. (Patients taking Tikosyn should avoid  Benadryl , and may take Zyrtec, Claritin , or Allegra) If you experience trouble breathing, this can be serious. If it is severe call 911 IMMEDIATELY. If it is mild, please call our office.  We will call to schedule your test 2-4 weeks out understanding that some insurance companies will need an authorization prior to the service being performed.   For more information and frequently asked questions, please visit our website : [email or web address]  For non-scheduling related questions,  please contact the cardiac imaging nurse navigator should you have any questions/concerns: Cardiac Imaging Nurse Navigators Direct Office Dial: [phone]   For scheduling needs, including cancellations and rescheduling, please call [place], [phone].   Signed, Jerel Balding, MD  01/22/2024 4:03 PM    [facility]

## 2024-01-23 DIAGNOSIS — R079 Chest pain, unspecified: Secondary | ICD-10-CM | POA: Diagnosis not present

## 2024-01-23 DIAGNOSIS — E113512 Type 2 diabetes mellitus with proliferative diabetic retinopathy with macular edema, left eye: Secondary | ICD-10-CM | POA: Diagnosis not present

## 2024-01-23 DIAGNOSIS — E113411 Type 2 diabetes mellitus with severe nonproliferative diabetic retinopathy with macular edema, right eye: Secondary | ICD-10-CM | POA: Diagnosis not present

## 2024-01-23 DIAGNOSIS — Z01812 Encounter for preprocedural laboratory examination: Secondary | ICD-10-CM | POA: Diagnosis not present

## 2024-01-23 DIAGNOSIS — Z961 Presence of intraocular lens: Secondary | ICD-10-CM | POA: Diagnosis not present

## 2024-01-23 DIAGNOSIS — H43813 Vitreous degeneration, bilateral: Secondary | ICD-10-CM | POA: Diagnosis not present

## 2024-01-23 DIAGNOSIS — H35033 Hypertensive retinopathy, bilateral: Secondary | ICD-10-CM | POA: Diagnosis not present

## 2024-01-23 DIAGNOSIS — H4312 Vitreous hemorrhage, left eye: Secondary | ICD-10-CM | POA: Diagnosis not present

## 2024-01-23 DIAGNOSIS — H35372 Puckering of macula, left eye: Secondary | ICD-10-CM | POA: Diagnosis not present

## 2024-01-23 LAB — BASIC METABOLIC PANEL WITH GFR
BUN/Creatinine Ratio: 12 (ref 12–28)
BUN: 19 mg/dL (ref 8–27)
CO2: 21 mmol/L (ref 20–29)
Calcium: 10 mg/dL (ref 8.7–10.3)
Chloride: 103 mmol/L (ref 96–106)
Creatinine, Ser: 1.55 mg/dL — ABNORMAL HIGH (ref 0.57–1.00)
Glucose: 105 mg/dL — ABNORMAL HIGH (ref 70–99)
Potassium: 4.4 mmol/L (ref 3.5–5.2)
Sodium: 140 mmol/L (ref 134–144)
eGFR: 36 mL/min/1.73 — ABNORMAL LOW (ref >59)

## 2024-01-24 ENCOUNTER — Ambulatory Visit: Payer: Self-pay | Admitting: Cardiovascular Disease

## 2024-01-25 ENCOUNTER — Encounter (HOSPITAL_COMMUNITY): Payer: Self-pay

## 2024-01-27 ENCOUNTER — Telehealth (HOSPITAL_COMMUNITY): Payer: Self-pay | Admitting: Emergency Medicine

## 2024-01-27 NOTE — Telephone Encounter (Signed)
 Reaching out to patient to offer assistance regarding upcoming cardiac imaging study; pt verbalizes understanding of appt date/time, parking situation and where to check in, pre-test NPO status and medications ordered, and verified current allergies; name and call back number provided for further questions should they arise Rockwell Alexandria RN Navigator Cardiac Imaging [facility] [phone] office [phone] cell

## 2024-01-30 ENCOUNTER — Ambulatory Visit (HOSPITAL_COMMUNITY)
Admission: RE | Admit: 2024-01-30 | Discharge: 2024-01-30 | Disposition: A | Discharge status: Home / Self Care | Source: Ambulatory Visit | Attending: Cardiology | Admitting: Cardiology

## 2024-01-30 VITALS — BP 148/78 | HR 98

## 2024-01-30 DIAGNOSIS — R079 Chest pain, unspecified: Secondary | ICD-10-CM | POA: Diagnosis not present

## 2024-01-30 DIAGNOSIS — R931 Abnormal findings on diagnostic imaging of heart and coronary circulation: Secondary | ICD-10-CM | POA: Insufficient documentation

## 2024-01-30 DIAGNOSIS — I2510 Atherosclerotic heart disease of native coronary artery without angina pectoris: Secondary | ICD-10-CM

## 2024-01-30 MED ORDER — IOHEXOL 350 MG/ML SOLN
100.0000 mL | Freq: Once | INTRAVENOUS | Status: AC | PRN
  Administered 2024-01-30: 100 mL via INTRAVENOUS

## 2024-01-30 MED ORDER — NITROGLYCERIN 0.4 MG SL SUBL
0.8000 mg | SUBLINGUAL_TABLET | Freq: Once | SUBLINGUAL | Status: AC
  Administered 2024-01-30: 0.8 mg via SUBLINGUAL

## 2024-01-31 ENCOUNTER — Ambulatory Visit (HOSPITAL_BASED_OUTPATIENT_CLINIC_OR_DEPARTMENT_OTHER)
Admission: RE | Admit: 2024-01-31 | Discharge: 2024-01-31 | Disposition: A | Discharge status: Home / Self Care | Source: Ambulatory Visit | Attending: Cardiology | Admitting: Cardiology

## 2024-01-31 DIAGNOSIS — I2510 Atherosclerotic heart disease of native coronary artery without angina pectoris: Secondary | ICD-10-CM | POA: Diagnosis not present

## 2024-01-31 DIAGNOSIS — R931 Abnormal findings on diagnostic imaging of heart and coronary circulation: Secondary | ICD-10-CM | POA: Diagnosis not present

## 2024-01-31 DIAGNOSIS — R079 Chest pain, unspecified: Secondary | ICD-10-CM | POA: Diagnosis not present

## 2024-02-21 ENCOUNTER — Encounter: Payer: Self-pay | Admitting: Podiatry

## 2024-02-21 ENCOUNTER — Ambulatory Visit (INDEPENDENT_AMBULATORY_CARE_PROVIDER_SITE_OTHER): Admitting: Podiatry

## 2024-02-21 VITALS — Ht 64.0 in | Wt 190.0 lb

## 2024-02-21 DIAGNOSIS — G5790 Unspecified mononeuropathy of unspecified lower limb: Secondary | ICD-10-CM

## 2024-02-21 DIAGNOSIS — E0842 Diabetes mellitus due to underlying condition with diabetic polyneuropathy: Secondary | ICD-10-CM | POA: Diagnosis not present

## 2024-02-21 NOTE — Progress Notes (Signed)
 Patient presents with complaint of tingling and numbness in the feet.  Does not any burning sensations.  She is also got history of PAD.  Her left leg especially vascular has been keeping an eye on.  Tried gabapentin in the past however she got hives from it.   Physical exam:  General appearance: Pleasant, and in no acute distress. AOx3.  Vascular: Pedal pulses: DP 1/4 bilaterally, PT 0/4 bilaterally.  Mild to moderate edema at lower legs bilaterally. Capillary fill time immediate bilaterally.  Neurological: Vibratory sensation diminished bilaterally feet monofilament sensation intact.  Normal Achilles tendon reflex bilaterally.  Negative Tinel's sign tarsal tunnel and porta pedis and along the dorsal cutaneous nerves feet bilaterally .  Dermatologic:   Skin normal temperature bilaterally.  Skin normal color, tone, and texture bilaterally.   Musculoskeletal: Hammertoes 2 through 5 bilaterally.  Normal muscle strength lower extremities bilaterally.    Diagnosis: 1.  Neuritis feet bilaterally. 2.  Diabetic neuropathy with type 2 diabetes bilaterally  Plan: -New patient office visit for evaluation and management level 3. -Discussed with her the the neuropathy from the diabetes he is getting.  Given her problems she had with gabapentin in the past I think we will hold off on any medications right now.  If it does start getting worse or the pain starts increasing then we might need to consider some other options such as Lyrica.  Discussed over-the-counter things she can use up with 1% lidocaine  gel or capsaicin cream.   -Recommended good supportive shoes that are closed.  Recommended against wearing flip-flops like today.  Discussed with precautions to take as a diabetic and neuropathy.  Return as needed

## 2024-03-05 DIAGNOSIS — E113512 Type 2 diabetes mellitus with proliferative diabetic retinopathy with macular edema, left eye: Secondary | ICD-10-CM | POA: Diagnosis not present

## 2024-03-06 ENCOUNTER — Other Ambulatory Visit: Payer: Self-pay

## 2024-03-06 DIAGNOSIS — I739 Peripheral vascular disease, unspecified: Secondary | ICD-10-CM

## 2024-03-06 DIAGNOSIS — I6529 Occlusion and stenosis of unspecified carotid artery: Secondary | ICD-10-CM

## 2024-03-12 DIAGNOSIS — E113513 Type 2 diabetes mellitus with proliferative diabetic retinopathy with macular edema, bilateral: Secondary | ICD-10-CM | POA: Diagnosis not present

## 2024-04-04 ENCOUNTER — Ambulatory Visit (HOSPITAL_COMMUNITY)
Admission: RE | Admit: 2024-04-04 | Discharge: 2024-04-04 | Disposition: A | Discharge status: Home / Self Care | Source: Ambulatory Visit | Attending: Vascular Surgery | Admitting: Vascular Surgery

## 2024-04-04 ENCOUNTER — Ambulatory Visit (HOSPITAL_COMMUNITY): Admission: RE | Admit: 2024-04-04 | Discharge: 2024-04-04 | Attending: Vascular Surgery

## 2024-04-04 ENCOUNTER — Encounter: Payer: Self-pay | Admitting: Physician Assistant

## 2024-04-04 ENCOUNTER — Ambulatory Visit: Attending: Vascular Surgery | Admitting: Physician Assistant

## 2024-04-04 VITALS — BP 123/75 | HR 85 | Temp 97.9°F | Wt 192.1 lb

## 2024-04-04 DIAGNOSIS — I6523 Occlusion and stenosis of bilateral carotid arteries: Secondary | ICD-10-CM | POA: Diagnosis not present

## 2024-04-04 DIAGNOSIS — I739 Peripheral vascular disease, unspecified: Secondary | ICD-10-CM

## 2024-04-04 DIAGNOSIS — I70213 Atherosclerosis of native arteries of extremities with intermittent claudication, bilateral legs: Secondary | ICD-10-CM | POA: Diagnosis not present

## 2024-04-04 DIAGNOSIS — I6529 Occlusion and stenosis of unspecified carotid artery: Secondary | ICD-10-CM | POA: Insufficient documentation

## 2024-04-04 LAB — VAS US ABI WITH/WO TBI
Left ABI: 0.63
Right ABI: 0.58

## 2024-04-04 NOTE — Progress Notes (Signed)
 Office Note     CC:  follow up Requesting Provider:  Marvine Rush, MD  HPI: Kelli Summers is a 68 y.o. ([birth year]) female who presents for routine follow up of carotid artery stenosis and PAD. She has not had any carotid intervention or intervention on her lower extremities. We have been following her for bilateral claudication symptoms for several years. Her carotid stenosis was incidentally found on CT scan back in 2019. She has no history of TIA or stroke.   Today she explains that overall she has been feeling good.She has no change in her claudication symptoms. Her leg pain is dependent on the shoes she wears. Says if she wears tennis shoes she can tolerate about 1/4 mile. If she wears flip flops (which she prefers) she can't walk quite as far. The calf tightness is resolved with a couple minutes of rest. She has been using Ellipse pedal machine 1-2x per day for 30 minutes. She says she tries to do 2 miles per day. No pain with this. She also says she stays very active and she walks around her house all day without difficulty. She denies any pain at rest or tissue loss. She does have neuropathy in her left foot > right foot. Previously took gabapentin but did not tolerate it at all. She checks her feet regularly and tries to keep them protected. She says she has been keeping her blood sugars under really good control- reports last A1c of 5.8.   The pt not on a statin for cholesterol management.    The pt is not an aspirin.    Other AC:  none The pt is on ARB and BB for hypertension.  The pt does have diabetes. Tobacco hx:  Former, quit in 2013  Past Medical History:  Diagnosis Date   Arthritis    Asthma    Chronic back pain    Chronic knee pain    Complication of anesthesia    didnt wake up for 3 days after wedge resection in 2014   Depression    Diabetes mellitus    Dysrhythmia    Ejection fraction    History of stress test 01/2010   normal myoview stress test   Hypertension     Sinus tachycardia     Past Surgical History:  Procedure Laterality Date   ABDOMINAL HYSTERECTOMY     BACK SURGERY     CATARACT EXTRACTION W/PHACO Right 01/12/2019   Procedure: CATARACT EXTRACTION PHACO AND INTRAOCULAR LENS PLACEMENT RIGHT EYE;  Surgeon: Harrie Agent, MD;  Location: [facility];  Service: Ophthalmology;  Laterality: Right;  right   CATARACT EXTRACTION W/PHACO Left 01/26/2019   Procedure: CATARACT EXTRACTION PHACO AND INTRAOCULAR LENS PLACEMENT (IOC);  Surgeon: Harrie Agent, MD;  Location: [facility];  Service: Ophthalmology;  Laterality: Left;  CDE: 7.07   CHOLECYSTECTOMY     COLONOSCOPY N/A 04/15/2015   Procedure: COLONOSCOPY;  Surgeon: Oneil Budge, MD;  Location: [facility];  Service: Gastroenterology;  Laterality: N/A;   CYST EXCISION Left 04/02/2016   Procedure: EXCISION 1 CM CYST ON LEFT SIDE OF FACE;  Surgeon: Oneil Budge, MD;  Location: [facility];  Service: General;  Laterality: Left;   ORIF HUMERUS FRACTURE Left 07/28/2021   Procedure: OPEN REDUCTION INTERNAL FIXATION (ORIF) HUMERUS FRACTURE;  Surgeon: Margrette Taft BRAVO, MD;  Location: [facility];  Service: Orthopedics;  Laterality: Left;   REDUCTION MAMMAPLASTY     VIDEO ASSISTED THORACOSCOPY (VATS)/DECORTICATION Right 05/09/2013   Procedure: VIDEO ASSISTED THORACOSCOPY (VATS)/DECORTICATION;  Surgeon: Elspeth JAYSON Millers, MD;  Location: [facility] OR;  Service: Thoracic;  Laterality: Right;   WEDGE RESECTION Right 05/09/2013   Procedure: WEDGE RESECTION RIGHT LOWER LOBE;  Surgeon: Elspeth JAYSON Millers, MD;  Location: [facility] OR;  Service: Thoracic;  Laterality: Right;    Social History   Socioeconomic History   Marital status: Divorced    Spouse name: Not on file   Number of children: Not on file   Years of education: Not on file   Highest education level: Not on file  Occupational History   Not on file  Tobacco Use   Smoking status: Former    Current packs/day: 0.00    Average packs/day: 0.5 packs/day for 40.0 years  (20.0 ttl pk-yrs)    Types: Cigarettes    Start date: 04/28/1972    Quit date: 04/28/2012    Years since quitting: 11.9    Passive exposure: Never   Smokeless tobacco: Former  Building services engineer status: Never Used  Substance and Sexual Activity   Alcohol  use: No   Drug use: No   Sexual activity: Not Currently    Birth control/protection: Surgical  Other Topics Concern   Not on file  Social History Narrative   Not on file   Social Drivers of Health   Financial Resource Strain: Low Risk  (05/05/2023)   Overall Financial Resource Strain (CARDIA)    Difficulty of Paying Living Expenses: Not very hard  Food Insecurity: No Food Insecurity (08/31/2022)   Hunger Vital Sign    Worried About Running Out of Food in the Last Year: Never true    Ran Out of Food in the Last Year: Never true  Transportation Needs: No Transportation Needs (05/05/2023)   PRAPARE - Administrator, Civil Service (Medical): No    Lack of Transportation (Non-Medical): No  Physical Activity: Not on file  Stress: Not on file  Social Connections: Unknown (11/06/2021)   [facility]   Social Network    Social Network: Not on file  Intimate Partner Violence: Unknown (09/29/2021)   Received from [facility]   HITS    Physically Hurt: Not on file    Insult or Talk Down To: Not on file    Threaten Physical Harm: Not on file    Scream or Curse: Not on file    Family History  Problem Relation Age of Onset   Breast cancer Maternal Aunt    Breast cancer Maternal Aunt    Breast cancer Maternal Aunt    Breast cancer Maternal Aunt    Breast cancer Maternal Grandmother    Emphysema Maternal Grandfather        smoked    Current Outpatient Medications  Medication Sig Dispense Refill   albuterol  (VENTOLIN  HFA) 108 (90 Base) MCG/ACT inhaler 1-2 puffs every 6 (six) hours as needed for wheezing or shortness of breath.     Ascorbic Acid (VITAMIN C) 500 MG CAPS Take 500 mg by mouth daily at 6  (six) AM.     Calcium Carb-Cholecalciferol (CALCIUM 600+D3 PO) Take 1 tablet by mouth daily at 6 (six) AM.     Cholecalciferol (D3 PO) Take 50 mg by mouth daily at 6 (six) AM.     Continuous Glucose Sensor (DEXCOM G7 SENSOR) MISC      diazepam  (VALIUM ) 5 MG tablet Take 5 mg by mouth 2 (two) times daily as needed.     Dulaglutide (TRULICITY Mc[place]od) Inject into the skin.  EPINEPHrine  (PRIMATENE  MIST IN) Inhale 2 puffs into the lungs 3 (three) times daily as needed (shortness of breath).     fluticasone  (FLONASE ) 50 MCG/ACT nasal spray Place 1-2 sprays into both nostrils daily as needed (nasal congestion.).     glipiZIDE (GLUCOTROL XL) 10 MG 24 hr tablet Take 20 mg by mouth daily with breakfast.     LANTUS  SOLOSTAR 100 UNIT/ML Solostar Pen Inject 50 Units into the skin at bedtime. (Patient taking differently: Inject 40 Units into the skin at bedtime.)     losartan (COZAAR) 100 MG tablet Take 100 mg by mouth in the morning.  0   metoprolol  tartrate (LOPRESSOR ) 100 MG tablet Take 1 tablet (100 mg total) by mouth once for 1 dose. Take 90-120 minutes prior to scan. Hold for SBP less than 110. 1 tablet 0   metoprolol  tartrate (LOPRESSOR ) 25 MG tablet Take 25 mg by mouth 2 (two) times daily.     MOUNJARO 10 MG/0.5ML Pen Inject into the skin once a week.     Multiple Vitamins-Minerals (ONE DAILY WOMENS 50 PLUS) TABS Take 1 tablet by mouth daily at 6 (six) AM.     OVER THE COUNTER MEDICATION Take 1 tablet by mouth at bedtime.     No current facility-administered medications for this visit.    Allergies  Allergen Reactions   Penicillins Hives, Shortness Of Breath and Itching    Tolerates Ceftin  Has patient had a PCN reaction causing immediate rash, facial/tongue/throat swelling, SOB or lightheadedness with hypotension: Yes Has patient had a PCN reaction causing severe rash involving mucus membranes or skin necrosis: No Has patient had a PCN reaction that required hospitalization No Has patient had a  PCN reaction occurring within the last 10 years: No If all of the above answers are NO, then may proceed with Cephalosporin use.    Ciprofloxacin Hives and Itching   Hydrocodone -Acetaminophen  Itching    Pt can take acetaminophen    Percocet [Oxycodone -Acetaminophen ] Itching   Biotin Itching and Rash     REVIEW OF SYSTEMS:  Negative unless noted in HPI [X]  denotes positive finding, [ ]  denotes negative finding Cardiac  Comments:  Chest pain or chest pressure:    Shortness of breath upon exertion:    Short of breath when lying flat:    Irregular heart rhythm:        Vascular    Pain in calf, thigh, or hip brought on by ambulation:    Pain in feet at night that wakes you up from your sleep:     Blood clot in your veins:    Leg swelling:         Pulmonary    Oxygen at home:    Productive cough:     Wheezing:         Neurologic    Sudden weakness in arms or legs:     Sudden numbness in arms or legs:     Sudden onset of difficulty speaking or slurred speech:    Temporary loss of vision in one eye:     Problems with dizziness:         Gastrointestinal    Blood in stool:     Vomited blood:         Genitourinary    Burning when urinating:     Blood in urine:        Psychiatric    Major depression:         Hematologic    Bleeding problems:  Problems with blood clotting too easily:        Skin    Rashes or ulcers:        Constitutional    Fever or chills:      PHYSICAL EXAMINATION:  Vitals:   04/04/24 1417 04/04/24 1418  BP: 128/76 123/75  Pulse: 85 85  Temp: 97.9 F (36.6 C)   TempSrc: Temporal   Weight: 192 lb 1.6 oz (87.1 kg)     General:  WDWN in NAD; vital signs documented above Gait: Normal HENT: WNL, normocephalic Pulmonary: normal non-labored breathing Cardiac: regular HR Abdomen: soft Vascular Exam/Pulses: 2+ radial, 2+ femoral, no palpable distal pulses, feet warm and well perfused Extremities: without ischemic changes, without Gangrene  , without cellulitis; without open wounds;  Musculoskeletal: no muscle wasting or atrophy  Neurologic: A&O X 3 Psychiatric:  The pt has Normal affect.   Non-Invasive Vascular Imaging:   +-------+-----------+-----------+------------+------------+  ABI/TBIToday's ABIToday's TBIPrevious ABIPrevious TBI  +-------+-----------+-----------+------------+------------+  Right .58        .76        .68         .37           +-------+-----------+-----------+------------+------------+  Left  .63        .55        .61         .33           +-------+-----------+-----------+------------+------------+   Bilateral ABIs and Left TBIs appear essentially unchanged compared to  prior study on 02/23/2024. Right TBIs appear increased compared to prior  study on 02/23/2024.   VAS US  Carotid: Summary:  Right Carotid: Velocities in the right ICA are consistent with a 40-59% stenosis. Non-hemodynamically significant plaque <50% noted in the CCA.   Left Carotid: Velocities in the left ICA are consistent with a 1-39% stenosis.  Non-hemodynamically significant plaque <50% noted in the CCA. The ECA appears >50% stenosed.   Vertebrals:  Bilateral vertebral arteries demonstrate antegrade flow.  Subclavians: Normal flow hemodynamics were seen in bilateral subclavian arteries.   ASSESSMENT/PLAN:: 68 y.o. female here for routine follow up of carotid artery stenosis and PAD. She has not had any carotid intervention or intervention on her lower extremities. We have been following her for bilateral claudication symptoms for several years. Her carotid stenosis was incidentally found on CT scan back in 2019. She has no history of TIA or stroke. She remains without any TIA or stroke like symptoms. She continues to have non disabling claudication bilaterally. She has no rest pain or tissue loss.  - ABI today are stable - Carotid duplex shows 40-59% right ICA, left 1-39%. Normal flow in the vertebral and  subclavians. This is essentially unchanged from prior studies - encourage continued exercise regimen - encourage her to keep her feet protected and check them regularly - reviewed signs and symptoms of TIA and stroke - She will follow up in 1 year with repeat carotid duplex and ABI   Teretha Damme, PA-C [facility] [phone]  Clinic MD:   [place]

## 2024-05-15 ENCOUNTER — Other Ambulatory Visit (HOSPITAL_COMMUNITY): Payer: Self-pay | Admitting: Family Medicine

## 2024-05-15 DIAGNOSIS — F17201 Nicotine dependence, unspecified, in remission: Secondary | ICD-10-CM

## 2024-06-03 ENCOUNTER — Ambulatory Visit (HOSPITAL_COMMUNITY)
Admission: RE | Admit: 2024-06-03 | Discharge: 2024-06-03 | Disposition: A | Source: Ambulatory Visit | Attending: Family Medicine | Admitting: Family Medicine

## 2024-06-03 DIAGNOSIS — F17201 Nicotine dependence, unspecified, in remission: Secondary | ICD-10-CM
